# Patient Record
Sex: Male | Born: 1955 | Race: Black or African American | Hispanic: No | Marital: Married | Smoking: Never smoker
Health system: Southern US, Community
[De-identification: ages and names within clinical notes are randomized; demographics above are authoritative.]

## PROBLEM LIST (undated history)

## (undated) DIAGNOSIS — G35 Multiple sclerosis: Secondary | ICD-10-CM

## (undated) DIAGNOSIS — I10 Essential (primary) hypertension: Secondary | ICD-10-CM

## (undated) DIAGNOSIS — E782 Mixed hyperlipidemia: Secondary | ICD-10-CM

## (undated) DIAGNOSIS — I341 Nonrheumatic mitral (valve) prolapse: Secondary | ICD-10-CM

## (undated) DIAGNOSIS — E119 Type 2 diabetes mellitus without complications: Secondary | ICD-10-CM

## (undated) DIAGNOSIS — I119 Hypertensive heart disease without heart failure: Secondary | ICD-10-CM

## (undated) HISTORY — DX: Nonrheumatic mitral (valve) prolapse: I34.1

## (undated) HISTORY — PX: OTHER SURGICAL HISTORY: SHX169

## (undated) HISTORY — DX: Mixed hyperlipidemia: E78.2

## (undated) HISTORY — DX: Hypertensive heart disease without heart failure: I11.9

## (undated) HISTORY — DX: Essential (primary) hypertension: I10

---

## 1998-12-10 ENCOUNTER — Encounter: Payer: Self-pay | Admitting: Vascular Surgery

## 1998-12-11 ENCOUNTER — Encounter (INDEPENDENT_AMBULATORY_CARE_PROVIDER_SITE_OTHER): Payer: Self-pay | Admitting: Specialist

## 1998-12-11 ENCOUNTER — Ambulatory Visit (HOSPITAL_COMMUNITY): Admission: RE | Admit: 1998-12-11 | Discharge: 1998-12-11 | Payer: Self-pay | Admitting: Orthopedic Surgery

## 2000-06-26 ENCOUNTER — Emergency Department (HOSPITAL_COMMUNITY): Admission: EM | Admit: 2000-06-26 | Discharge: 2000-06-26 | Payer: Self-pay | Admitting: Emergency Medicine

## 2000-06-26 ENCOUNTER — Encounter: Payer: Self-pay | Admitting: Emergency Medicine

## 2003-07-20 ENCOUNTER — Ambulatory Visit (HOSPITAL_COMMUNITY): Admission: RE | Admit: 2003-07-20 | Discharge: 2003-07-20 | Payer: Self-pay | Admitting: Surgery

## 2003-07-20 ENCOUNTER — Ambulatory Visit (HOSPITAL_BASED_OUTPATIENT_CLINIC_OR_DEPARTMENT_OTHER): Admission: RE | Admit: 2003-07-20 | Discharge: 2003-07-20 | Payer: Self-pay | Admitting: Surgery

## 2004-03-02 ENCOUNTER — Emergency Department (HOSPITAL_COMMUNITY): Admission: EM | Admit: 2004-03-02 | Discharge: 2004-03-02 | Payer: Self-pay | Admitting: Emergency Medicine

## 2005-07-25 ENCOUNTER — Emergency Department (HOSPITAL_COMMUNITY): Admission: EM | Admit: 2005-07-25 | Discharge: 2005-07-26 | Payer: Self-pay | Admitting: Emergency Medicine

## 2006-03-15 ENCOUNTER — Emergency Department (HOSPITAL_COMMUNITY): Admission: EM | Admit: 2006-03-15 | Discharge: 2006-03-15 | Payer: Self-pay | Admitting: Emergency Medicine

## 2007-10-19 ENCOUNTER — Encounter: Admission: RE | Admit: 2007-10-19 | Discharge: 2007-10-19 | Payer: Self-pay | Admitting: Specialist

## 2008-01-04 ENCOUNTER — Ambulatory Visit: Payer: Self-pay | Admitting: Cardiology

## 2008-08-15 ENCOUNTER — Ambulatory Visit: Payer: Self-pay | Admitting: Cardiology

## 2008-08-15 DIAGNOSIS — E669 Obesity, unspecified: Secondary | ICD-10-CM | POA: Insufficient documentation

## 2008-08-15 DIAGNOSIS — I059 Rheumatic mitral valve disease, unspecified: Secondary | ICD-10-CM | POA: Insufficient documentation

## 2008-08-15 DIAGNOSIS — I080 Rheumatic disorders of both mitral and aortic valves: Secondary | ICD-10-CM | POA: Insufficient documentation

## 2008-12-22 ENCOUNTER — Encounter (INDEPENDENT_AMBULATORY_CARE_PROVIDER_SITE_OTHER): Payer: Self-pay | Admitting: *Deleted

## 2009-06-04 DIAGNOSIS — I10 Essential (primary) hypertension: Secondary | ICD-10-CM | POA: Insufficient documentation

## 2009-06-29 ENCOUNTER — Ambulatory Visit: Payer: Self-pay | Admitting: Cardiology

## 2010-06-25 NOTE — Assessment & Plan Note (Signed)
Summary: rov/ gd   Visit Type:  rov Primary Provider:  [facility]   CC:  no cardiac complaints today.  History of Present Illness: James Gibbs comes in today for followup of his hypertension. Unfortunately, generalized stress and is gaining some weight area is got away from his diet.  His blood pressure is up today. He does not check it on a regular basis.  He denies any chest pain, orthopnea, PND or peripheral edema. He's had no palpitations.  He also has a history of mild mitral valve prolapse with minimal mitral regurgitation.  Current Medications (verified): 1)  Chewable Vitamin C 500 Mg Chew (Ascorbic Acid) .... Once Daily 2)  Multivitamin Tablet Otc .... Take 1 Tablet By Mouth Once A Day 3)  Norvasc 10 Mg Tabs (Amlodipine Besylate) 4)  Toprol Xl 100 Mg Xr24h-Tab (Metoprolol Succinate) .... Take 1 Tablet By Mouth Once A Day 5)  Cardura 2 Mg Tabs (Doxazosin Mesylate) .... Take 1 Tablet By Mouth Once A Day 6)  Pravachol 40 Mg Tabs (Pravastatin Sodium) .... Take 1 Tablet By Mouth Once A Day 7)  Allopurinol 300 Mg Tabs (Allopurinol) .... Take 1 Tablet By Mouth Once A Day 8)  Flexeril 10 Mg Tabs (Cyclobenzaprine Hcl) .... As Needed 9)  Vicodin 5-500 Mg Tabs (Hydrocodone-Acetaminophen) .... As Needed  Allergies (verified): No Known Drug Allergies  Past History:  Past Medical History: Last updated: 06/04/2009 HYPERTENSION, UNCONTROLLED (ICD-401.9) OBESITY, UNSPECIFIED (ICD-278.00) MITRAL REGURGITATION (ICD-396.3) MITRAL VALVE PROLAPSE (ICD-424.0)      Past Surgical History: Last updated: 06/04/2009 Umbilical hernia repair with mesh.. SURGEON:  Douglas A. Magnus Ivan, M.D.Marland Kitchen. 07/20/2003  Family History: Last updated: 06/04/2009 There is no premature history of coronary disease.      Social History: Last updated: 06/04/2009  He is married, he has 1 child.   Review of Systems       negative other than history of present illness  Vital Signs:  Patient profile:   55  year old male Height:      72 inches Weight:      247 pounds BMI:     33.62 Pulse rate:   69 / minute Pulse rhythm:   irregular BP sitting:   122 / 90  (left arm) Cuff size:   large  Vitals Entered By: James Gibbs, James Gibbs (June 29, 2009 3:47 PM)  Physical Exam  General:  obese.   Head:  normocephalic and atraumatic Eyes:  PERRLA/EOM intact; conjunctiva and lids normal. Neck:  Neck supple, no JVD. No masses, thyromegaly or abnormal cervical nodes. Chest Krystalynn Ridgeway:  no deformities or breast masses noted Lungs:  Clear bilaterally to auscultation and percussion. Heart:  normal PMI, no gallop or click. No significant murmur Abdomen:  Bowel sounds positive; abdomen soft and non-tender without masses, organomegaly, or hernias noted. No hepatosplenomegaly. Msk:  Back normal, normal gait. Muscle strength and tone normal. Pulses:  pulses normal in all 4 extremities Extremities:  No clubbing or cyanosis. Neurologic:  Alert and oriented x 3. Skin:  Intact without lesions or rashes. Psych:  Normal affect.   EKG  Procedure date:  06/29/2009  Findings:      normal sinus rhythm first degree block no old charts here for LVH T-wave inversion laterally and apically. This is more exaggerated than last EKG. Findings consistent with hypertension  Impression & Recommendations:  Problem # 1:  HYPERTENSION, UNCONTROLLED (ICD-401.9) Assessment Deteriorated I suspect his blood pressures been up some over the past several months. This up today and  his weight is up as well. He's been a lot of stress in his diet has suffered.  I reiterated the importance of a low sodium diet, weight reduction, and taking his medications. He'll continue to follow with his primary care about blood pressure at this point. He wanted something for reck L. dysfunction but I told him to uncontrolled hypertension was a contraindication. His updated medication list for this problem includes:    Norvasc 10 Mg Tabs (Amlodipine  besylate)    Toprol Xl 100 Mg Xr24h-tab (Metoprolol succinate) .Marland Kitchen... Take 1 tablet by mouth once a day    Cardura 2 Mg Tabs (Doxazosin mesylate) .Marland Kitchen... Take 1 tablet by mouth once a day  Problem # 2:  OBESITY, UNSPECIFIED (ICD-278.00) Assessment: Deteriorated  Problem # 3:  MITRAL VALVE PROLAPSE (ICD-424.0) Assessment: Unchanged  His updated medication list for this problem includes:    Toprol Xl 100 Mg Xr24h-tab (Metoprolol succinate) .Marland Kitchen... Take 1 tablet by mouth once a day  Other Orders: EKG w/ Interpretation (93000)  Patient Instructions: 1)  Your physician recommends that you schedule a follow-up appointment in: YEAR WITH DR Coryn Mosso 2)  Your physician recommends that you continue on your current medications as directed. Please refer to the Current Medication list given to you today. 3)  Your physician has requested that you limit the intake of sodium (salt) in your diet to two grams daily. Please see [facility] handout. 4)  Your physician encouraged you to lose weight for better health.

## 2010-10-08 NOTE — Assessment & Plan Note (Signed)
[facility]                            CARDIOLOGY OFFICE NOTE   SKYLOR, SCHNAPP                    MRN:          [id number]  DATE:08/15/2008                            DOB:          July 28, 1955    Ms. Studstill comes in today for followup of his difficult control  hypertension and history of mild mitral valve prolapse.   I saw him last in August 2009.  At that time, I was concerned about  dietary and medical compliance.  Since that time, he has lost 9 pounds  and is frequented [employer] only occasionally.  He is really cut back  on salt.   His blood pressure has been running about 120/80.   I have asked him to return for 2-D echo, but he did not make it because  of the snow.   His medicines are identical to last visit except he dropped his  lisinopril which was at 40 mg a day.  This was because there has been  problems with skin color changes in his father and his sister on the  same drug.   PHYSICAL EXAMINATION:  VITAL SIGNS:  His blood pressure today is 120/80,  pulse is 65-70 and regular.  HEENT:  Unchanged.  He has remarkable arcus senilis for man of his age.  LUNGS:  Clear to auscultation and percussion.  HEART:  Nondisplaced PMI.  Normal S1 and S2.  I could not hear a click.  ABDOMEN:  Soft, good bowel sounds.  EXTREMITIES:  No edema.  Pulses are intact.  NEURO:  Intact.   EKG is normal sinus rhythm with first-degree AV block.   ASSESSMENT AND PLAN:  Mr. Monday is doing remarkably better.  I am  really pleased with his weight loss and dietary restrictions to sodium.  His blood pressure is remarkably good here and at home even though he is  off the lisinopril.  This attests the fact that he is more compliant all  the way across the board.  I reinforced this today.  I will plan on  seeing him back again in 6 months.     Thomas C. Daleen Squibb, MD, [facility]  Electronically Signed    TCW/MedQ  DD: 08/15/2008  DT: 08/16/2008  Job #:  [id number]

## 2010-10-08 NOTE — Assessment & Plan Note (Signed)
[facility]                            CARDIOLOGY OFFICE NOTE   CHEY, CHO                    MRN:          [id number]  DATE:01/04/2008                            DOB:          1955/06/15    I was asked by Payton Spark to evaluate Gaylord Shih, 55-year-  old gentleman with progressive problems with hypertension.   HISTORY OF PRESENT ILLNESS:  He is a 55 year old gentleman who was seen  by Dr. Myrtis Ser of Cardiology in 2000.  At that time, he had an abnormal  EKG.  Echocardiogram showed thickening of the mitral valve with mild  mitral valve prolapse particularly in the anterior leaflet.  He had  trace mitral regurgitation.  There was no evidence at that time of  significant LVH and his LV function was normal.  He did have some mild  thickening of the interventricular septum.   He recently was at [facility] and his blood pressure was noted to be  suboptimally controlled.  His pressure there was 168/100.  His Norvasc  was increased to 10 mg a day.   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  He also  sprinkles some salt.   His weight is up by 25-30 pounds, which he has been struggling with.   He denies any headache, visual disturbances, any symptoms of TIAs, or  stroke.  He had no chest pain, shortness of breath, orthopnea, PND, and  no peripheral edema.   Recent urine protein was up to 30 mg on a sample.  His creatinine  actually is normal at 1.1.  Rest of his blood work was unremarkable at  [facility].  TSH was normal specifically.   PAST MEDICAL HISTORY:  He is no longer smoking.  He quit in 1985.   ALLERGIES:  He has no known drug allergies.   CURRENT MEDICATIONS:  1. Vitamin C 1500 mg a day.  2. Multivitamin daily.  3. Norvasc 10 mg a day.  4. Lisinopril 40 mg a day.  5. Toprol-XL 100 mg a day.  6. Cardura 2 mg daily.  7. Pravachol 40 mg q.h.s.  8. Allopurinol 300 mg a day.   He has some problems  with his back and he has a history of multiple  sclerosis.  He takes Flexeril p.r.n. as well as Vicodin.   FAMILY HISTORY:  There is no premature history of coronary disease.   SOCIAL HISTORY:  He is married, he has 1 child.   REVIEW OF SYSTEMS:  Other than HPI is negative.  All systems were  reviewed.   PHYSICAL EXAMINATION:  VITAL SIGNS:  His blood pressure was 147/94.  He  states he did not take 1 of his blood pressure medicines this morning,  but he cannot remember which one.  His pulse is 66 and regular, his  weight is 239.  HEENT:  Normocephalic, atraumatic.  PERRLA:  Arcus senilis.  He has  cataract reduced with light reflex bilaterally.  Funduscopic exam shows  arterial narrowing and nicking.  Facial symmetry is normal.  Dentition  is in ill  repair.  Carotids upstrokes were equal bilaterally without  bruits, no JVD.  Thyroid is not enlarged.  Trachea is midline.  LUNGS:  Clear.  HEART:  Reveals no obvious click or murmur.  Difficult to appreciate  PMI.  Normal S1 and S2.  ABDOMEN EXAM:  Protuberant, good bowel sounds.  No midline bruit.  No  hepatomegaly.  EXTREMITIES:  No sinus, clubbing, or edema.  Pulses were intact.  NEURO EXAM:  Intact.  SKIN:  Unremarkable.   Electrocardiogram shows sinus rhythm with first-degree AV block.  There  is no associated ST-segment changes which were actually less than they  were on last visit here in 2000.   ASSESSMENT:  1. Uncontrolled essential hypertension.  2. Dietary noncompliance.  3. Obesity.  4. Question of medical compliance.  5. History of mitral valve prolapse.   PLAN:  1. Repeat 2-D echocardiogram to assess mitral valve and LV function      not to mention LVH.  2. I spent 20 minutes talking to him about eliminating salt as much as      possible from his diet including fast food.  3. Try to lose weight.  Hopefully, about a pound a week or total of 12      pounds over the next 3 months.  4. No change in his medical  program today.  If his blood pressure is      not coming down with good compliance, Payton Spark could      increase his Cardura.  He is pretty well maxed out on his      lisinopril, Toprol, and Norvasc.   I will plan on seeing him back in 3 months.     Thomas C. Daleen Squibb, MD, [facility]  Electronically Signed    TCW/MedQ  DD: 01/04/2008  DT: 01/05/2008  Job #: [id number]   cc:   Payton Spark, PA-C  [facility]

## 2010-10-11 NOTE — Op Note (Signed)
NAME:  [name], [name] NO.:  [id number]   MEDICAL RECORD NO.:  [id number]                   PATIENT TYPE:  AMB   LOCATION:  [facility]                                  FACILITY:  [facility]   PHYSICIAN:  Abigail Miyamoto, M.D.              DATE OF BIRTH:  [birth year]   DATE OF PROCEDURE:  07/20/2003  DATE OF DISCHARGE:                                 OPERATIVE REPORT   PREOPERATIVE DIAGNOSIS:  Umbilical hernia.   POSTOPERATIVE DIAGNOSIS:  Umbilical hernia.   PROCEDURE:  Umbilical hernia repair with mesh.   SURGEON:  Douglas A. Magnus Ivan, M.D.   ANESTHESIA:  General endotracheal anesthesia and 0.5% Marcaine.   ESTIMATED BLOOD LOSS:  Minimal.   PROCEDURE IN DETAIL:  The patient was brought to the operating room and  identified as James Gibbs.  He was placed supine on the operating table  and general anesthesia was induced.  His abdomen was then prepped and draped  in the usual sterile fashion.  Using a 15 blade, a small transverse incision  was made above the umbilicus.  The incision was carried down to the fascia  with electrocautery.  The patient was found to have two fascial defects, one  above the umbilicus and one right at the umbilicus.  Both had minimal hernia  sacs which were excised and the contents, which contained only omentum, were  placed back into the abdominal cavity.  The patient also had a small lipoma  just above the upper fascial defect which was excised.  The two fascial  defects were then made into one by cutting the fascial bridge between the  two.  Next, a piece of Duval circular mesh 6.4 cm in size was brought onto  the field.  The mesh was placed through the fascial opening and then pulled  up by the strands to the abdominal wall.  The mesh was then sewn in place  circumferentially with interrupted #1 Prolene sutures.  The fascia was  closed over the top of the mesh with interrupted figure-of-eight 0 Prolene  sutures.  Good closure of  the fascia appeared to be achieved.  The fascia  and skin were then anesthetized with 0.5% Marcaine.  The umbilicus was  tacked back in place with a single 3-0 Vicryl suture.  The subcutaneous  tissue layer was closed with interrupted 3-0 Vicryl sutures.  The skin was  closed with a running 4-0 Monocryl.  Steri-Strips, gauze, and tape were  applied.  The patient tolerated the procedure well.  All sponge, needle and  instrument counts were correct at the end of the procedure.  The patient was  then extubated in the operating room and taken in stable condition to the  recovery room.  Abigail Miyamoto, M.D.    DB/MEDQ  D:  07/20/2003  T:  07/20/2003  Job:  [id number]

## 2012-08-17 ENCOUNTER — Encounter: Payer: Self-pay | Admitting: Cardiovascular Disease

## 2012-08-17 ENCOUNTER — Ambulatory Visit (INDEPENDENT_AMBULATORY_CARE_PROVIDER_SITE_OTHER): Payer: Medicare Other | Admitting: Cardiovascular Disease

## 2012-08-17 VITALS — BP 124/90 | HR 81 | Ht 71.0 in | Wt 235.0 lb

## 2012-08-17 DIAGNOSIS — R9431 Abnormal electrocardiogram [ECG] [EKG]: Secondary | ICD-10-CM

## 2012-08-17 DIAGNOSIS — I10 Essential (primary) hypertension: Secondary | ICD-10-CM

## 2012-08-17 NOTE — Progress Notes (Signed)
HPI:  57 year old gentleman presenting for cardiac evaluation. He has previously been seen by Dr. Daleen Squibb, last visit February 2011. The patient has been followed for malignant hypertension and mitral valve prolapse or mitral regurgitation. I have looked through 2 separate databases but cannot find an old echo report.  The patient has mild dyspnea with exertion, this primarily occurs with walking uphill. He is able to exercise at the [facility] without significant symptoms. He denies exertional chest pain or pressure. He denies edema, orthopnea, palpitations, lightheadedness, or PND.  He also complains of erectile dysfunction. This is been present for about 3 years. He would like to treat this with medication, but wants to make sure his heart is healthy enough.  He reports compliance with his medication. Since his last visit here in 2011, he has been diagnosed with type 2 diabetes. He takes metformin.   Outpatient Encounter Prescriptions as of 08/17/2012  Medication Sig Dispense Refill  . amLODipine (NORVASC) 10 MG tablet Take 10 mg by mouth daily.      . Ascorbic Acid (VITAMIN C) 100 MG tablet Take 300 mg by mouth daily.      Marland Kitchen aspirin 81 MG tablet Take 81 mg by mouth daily.      . hydrochlorothiazide (MICROZIDE) 12.5 MG capsule Take 12.5 mg by mouth daily.      . Interferon Beta-1a (REBIF REBIDOSE [place]) Inject 44 Units into the skin 3 (three) times a week.      Marland Kitchen lisinopril (PRINIVIL,ZESTRIL) 40 MG tablet Take 40 mg by mouth daily.      Marland Kitchen METFORMIN HCL PO Take by mouth 2 (two) times daily.      . Multiple Vitamin (MULTIVITAMIN) tablet Take 1 tablet by mouth daily.      . Omega-3 Fatty Acids (FISH OIL) 300 MG CAPS Take 300 capsules by mouth daily.      . simvastatin (ZOCOR) 40 MG tablet Take 20 mg by mouth every evening.       No facility-administered encounter medications on file as of 08/17/2012.    Review of patient's allergies indicates not on file.  Past Medical History  Diagnosis Date  .  Malignant hypertension   . Mitral valve anterior leaflet prolapse   . 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 Mixed hyperlipidemia     History reviewed. No pertinent past surgical history.  History   Social History  . Marital Status: Married    Spouse Name: N/A    Number of Children: N/A  . Years of Education: N/A   Occupational History  . Not on file.   Social History Main Topics  . Smoking status: Never Smoker   . Smokeless tobacco: Not on file  . Alcohol Use: No  . Drug Use: No  . Sexually Active: Not on file   Other Topics Concern  . Not on file   Social History Narrative  . No narrative on file   Family History: Mother has diabetes, father died of end-stage renal disease the  ROS: General: no fevers/chills/night sweats Eyes: no blurry vision, diplopia, or amaurosis ENT: no sore throat or hearing loss Resp: no cough, wheezing, or hemoptysis CV: no edema or palpitations GI: no abdominal pain, nausea, vomiting, diarrhea, or constipation GU: no dysuria, frequency, or hematuria Skin: no rash Neuro: no headache, numbness, tingling, or weakness of extremities Musculoskeletal: no joint pain or swelling Heme: no bleeding, DVT, or easy bruising Endo: no polydipsia or polyuria  BP 124/90  Pulse 81  Ht 5\' 11"  (1.803 m)  Wt 106.595 kg (235 lb)  BMI 32.79 kg/m2  SpO2 97%  PHYSICAL EXAM: Pt is alert and oriented, WD, WN, in no distress. HEENT: normal Neck: JVP normal. Carotid upstrokes normal without bruits. No thyromegaly. Lungs: equal expansion, clear bilaterally CV: Apex is discrete and nondisplaced, RRR without murmur or gallop Abd: soft, NT, +BS, no bruit, no hepatosplenomegaly Back: no CVA tenderness Ext: no C/C/E        Femoral pulses 2+= without bruits        DP/PT pulses intact and = Skin: warm and dry without rash Neuro: CNII-XII intact             Strength intact = bilaterally  EKG:  Normal sinus rhythm with first-degree AV  block, heart rate 81 beats per minute. T wave abnormality consider anterolateral ischemia.  ASSESSMENT AND PLAN: #1. Abnormal EKG. The patient has new EKG changes since his previous tracing several years ago. He has multiple risk factors for cardiac disease, including type 2 diabetes, hypertension, and hyperlipidemia. Recommend a stress echocardiogram for risk stratification and evaluation of myocardial ischemia.  #2. Hypertension, reasonably controlled. He will continue on amlodipine, hydrochlorothiazide, lisinopril. Followup and treatment per Dr. [name].  #3. Hyperlipidemia. Lipids followed by primary care and the [facility]. He is on a statin drug.  #4. Erectile dysfunction. Awake stress echo result. If low risk, certainly reasonable to prescribe a phosphodiesterase inhibitor.  Tonny Bollman 08/17/2012 10:21 AM

## 2012-08-17 NOTE — Patient Instructions (Addendum)
Your physician has requested that you have a stress echocardiogram. For further information please visit [email or web address] Please follow instruction sheet as given.  Your physician wants you to follow-up in: 1 year with Dr. Excell Seltzer. You will receive a reminder letter in the mail two months in advance. If you don't receive a letter, please call our office to schedule the follow-up appointment.

## 2012-08-19 ENCOUNTER — Encounter: Payer: Self-pay | Admitting: Cardiovascular Disease

## 2012-08-19 ENCOUNTER — Ambulatory Visit (HOSPITAL_COMMUNITY): Payer: Medicare Other | Attending: Cardiology

## 2012-08-19 ENCOUNTER — Ambulatory Visit (HOSPITAL_BASED_OUTPATIENT_CLINIC_OR_DEPARTMENT_OTHER): Payer: Medicare Other

## 2012-08-19 DIAGNOSIS — I517 Cardiomegaly: Secondary | ICD-10-CM | POA: Insufficient documentation

## 2012-08-19 DIAGNOSIS — R9431 Abnormal electrocardiogram [ECG] [EKG]: Secondary | ICD-10-CM | POA: Insufficient documentation

## 2012-08-19 DIAGNOSIS — I10 Essential (primary) hypertension: Secondary | ICD-10-CM | POA: Insufficient documentation

## 2012-08-19 DIAGNOSIS — R0989 Other specified symptoms and signs involving the circulatory and respiratory systems: Secondary | ICD-10-CM | POA: Insufficient documentation

## 2012-08-19 DIAGNOSIS — R0609 Other forms of dyspnea: Secondary | ICD-10-CM | POA: Insufficient documentation

## 2012-08-19 DIAGNOSIS — E119 Type 2 diabetes mellitus without complications: Secondary | ICD-10-CM | POA: Insufficient documentation

## 2012-08-19 DIAGNOSIS — I059 Rheumatic mitral valve disease, unspecified: Secondary | ICD-10-CM | POA: Insufficient documentation

## 2012-08-19 NOTE — Progress Notes (Signed)
Echocardiogram performed.  

## 2013-05-10 ENCOUNTER — Ambulatory Visit (INDEPENDENT_AMBULATORY_CARE_PROVIDER_SITE_OTHER): Payer: Medicare Other | Admitting: Family Medicine

## 2013-05-10 ENCOUNTER — Ambulatory Visit: Payer: Medicare Other

## 2013-05-10 DIAGNOSIS — R079 Chest pain, unspecified: Secondary | ICD-10-CM

## 2013-05-10 DIAGNOSIS — M545 Low back pain, unspecified: Secondary | ICD-10-CM

## 2013-05-10 DIAGNOSIS — M25559 Pain in unspecified hip: Secondary | ICD-10-CM

## 2013-05-10 DIAGNOSIS — M542 Cervicalgia: Secondary | ICD-10-CM

## 2013-05-10 DIAGNOSIS — R0781 Pleurodynia: Secondary | ICD-10-CM

## 2013-05-10 LAB — POCT UA - MICROSCOPIC ONLY
Casts, Ur, LPF, POC: NEGATIVE
Crystals, Ur, HPF, POC: NEGATIVE
Mucus, UA: POSITIVE
Yeast, UA: NEGATIVE

## 2013-05-10 LAB — POCT CBC
Granulocyte percent: 61.7 %G (ref 37–80)
HCT, POC: 46 % (ref 43.5–53.7)
Hemoglobin: 14.4 g/dL (ref 14.1–18.1)
Lymph, poc: 2.8 (ref 0.6–3.4)
MCH, POC: 31.9 pg — AB (ref 27–31.2)
MCHC: 31.3 g/dL — AB (ref 31.8–35.4)
MCV: 101.7 fL — AB (ref 80–97)
MID (cbc): 0.6 (ref 0–0.9)
MPV: 9.6 fL (ref 0–99.8)
POC Granulocyte: 5.4 (ref 2–6.9)
POC LYMPH PERCENT: 31.9 %L (ref 10–50)
POC MID %: 6.4 %M (ref 0–12)
Platelet Count, POC: 185 10*3/uL (ref 142–424)
RBC: 4.52 M/uL — AB (ref 4.69–6.13)
RDW, POC: 14.1 %
WBC: 8.8 10*3/uL (ref 4.6–10.2)

## 2013-05-10 LAB — POCT URINALYSIS DIPSTICK
Bilirubin, UA: NEGATIVE
Blood, UA: NEGATIVE
Glucose, UA: NEGATIVE
Ketones, UA: 15
Leukocytes, UA: NEGATIVE
Nitrite, UA: NEGATIVE
Protein, UA: 100
Spec Grav, UA: 1.025
Urobilinogen, UA: 0.2
pH, UA: 6

## 2013-05-10 MED ORDER — MELOXICAM 7.5 MG PO TABS: 7.5000 mg | ORAL_TABLET | Freq: Every day | ORAL | Status: DC

## 2013-05-10 MED ORDER — CYCLOBENZAPRINE HCL 5 MG PO TABS: 5.0000 mg | ORAL_TABLET | Freq: Every day | ORAL | Status: DC
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
## 2013-05-10 NOTE — Addendum Note (Signed)
Addended by: Eulah Pont on: 05/10/2013 08:42 PM   Modules accepted: Level of Service

## 2013-06-06 ENCOUNTER — Ambulatory Visit (INDEPENDENT_AMBULATORY_CARE_PROVIDER_SITE_OTHER): Payer: Medicare HMO | Admitting: Neurology

## 2013-06-06 ENCOUNTER — Encounter: Payer: Self-pay | Admitting: Neurology

## 2013-06-06 VITALS — BP 157/98 | HR 69 | Ht 72.0 in | Wt 219.0 lb

## 2013-06-06 DIAGNOSIS — G35 Multiple sclerosis: Secondary | ICD-10-CM

## 2013-06-06 NOTE — Progress Notes (Signed)
[facility]    Provider:  Dr Hosie Gibbs Referring Provider: Andi Gibbs, James Gibbs Emperor, MD Primary Care Physician:  James Gibbs Islam, MD  CC:  MS diagnosis  HPI:  James Gibbs Gibbs is a 58 y.o. male here as a referral from James Gibbs. James Gibbs Gibbs for possible MS diagnosis  Patient was seen by James Gibbs. James Gibbs Gibbs in the past, last visit was in 1987. Diagnosed with MS at that time, was having tinnitus, had MRI done and was told he had MS. Started taking Avonex and then switched to Rebif. Currently taking Tecfidera twice a day. He wishes to take Gilenya but is currently happy with the Tecfidera. His last MRI was around 1 year ago, states it is stable. States his MS is currently stable. No recent flare ups.   Discharged from service in 1977. Was officially diagnosed with MS in 1983. He reports during his time in the [employer] he had several episodes of focal weakness/sensory changes and episodes of painful vision loss/change. These were brief transient episodes and he was able to continue to serve actively in [name]. Patient was initially seen by James Gibbs. James Gibbs Gibbs in the mid 1980s who documented a history of MS since the patients early 1980s.   Review of Systems: Out of a complete 14 system review, the patient complains of only the following symptoms, and all other reviewed systems are negative. Denies any positive review of systems  History   Social History  . Marital Status: Married    Spouse Name: James Gibbs Gibbs    Number of Children: 1  . Years of Education: college   Occupational History  . Not on file.   Social History Main Topics  . Smoking status: Never Smoker   . Smokeless tobacco: Not on file  . Alcohol Use: No  . Drug Use: No  . Sexual Activity: Not on file   Other Topics Concern  . Not on file   Social History Narrative   Patient lives at home with wife James Gibbs Gibbs)   Patient is right handed   Education college   Caffeine consumption is 1 cup daily    No family history on file.  Past  Medical History  Diagnosis Date  . Malignant hypertension   . Mitral valve anterior leaflet prolapse   . 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 Mixed hyperlipidemia     Past Surgical History  Procedure Laterality Date  . None      Current Outpatient Prescriptions  Medication Sig Dispense Refill  . Acetaminophen (ARTHRITIS PAIN RELIEF PO) Take 650 mg by mouth.      Marland Kitchen allopurinol (ZYLOPRIM) 300 MG tablet Take 300 mg by mouth daily.      Marland Kitchen amLODipine (NORVASC) 10 MG tablet Take 10 mg by mouth daily.      . Ascorbic Acid (VITAMIN C) 100 MG tablet Take 300 mg by mouth daily.      Marland Kitchen aspirin 81 MG tablet Take 81 mg by mouth daily.      . cholecalciferol (VITAMIN D) 400 UNITS TABS tablet Take 400 Units by mouth.      . Cyanocobalamin (B-12) 2000 MCG TABS Take by mouth.      . hydrochlorothiazide (MICROZIDE) 12.5 MG capsule Take 12.5 mg by mouth daily.      Marland Kitchen lisinopril (PRINIVIL,ZESTRIL) 40 MG tablet Take 40 mg by mouth daily.      Marland Kitchen METFORMIN HCL PO Take by mouth 2 (two) times daily.      . Multiple Vitamin (MULTIVITAMIN)  tablet Take 1 tablet by mouth daily.      . Omega-3 Fatty Acids (FISH OIL) 300 MG CAPS Take 300 capsules by mouth daily.      . simvastatin (ZOCOR) 40 MG tablet Take 20 mg by mouth every evening.       No current facility-administered medications for this visit.    Allergies as of 06/06/2013  . (No Known Allergies)    Vitals: BP 157/98  Pulse 69  Ht 6' (1.829 m)  Wt 219 lb (99.338 kg)  BMI 29.70 kg/m2 Last Weight:  Wt Readings from Last 1 Encounters:  06/06/13 219 lb (99.338 kg)   Last Height:   Ht Readings from Last 1 Encounters:  06/06/13 6' (1.829 m)     Physical exam: Exam: Gen: NAD, conversant Eyes: anicteric sclerae, moist conjunctivae HENT: Atraumatic, oropharynx clear Neck: Trachea midline; supple,  Lungs: CTA, no wheezing, rales, rhonic                          CV: RRR, no MRG Abdomen: Soft, non-tender;    Extremities: No peripheral edema  Skin: Normal temperature, no rash,  Psych: Appropriate affect, pleasant  Neuro: MS: AA&Ox3, appropriately interactive, normal affect   Speech: fluent w/o paraphasic error  Memory: good recent and remote recall  CN: PERRL, EOMI no nystagmus, no ptosis, sensation intact to LT V1-V3 bilat, face symmetric, no weakness, hearing grossly intact, palate elevates symmetrically, shoulder shrug 5/5 bilat,  tongue protrudes midline, no fasiculations noted.  Motor: normal bulk and tone Strength: 5/5  In all extremities  Coord: rapid alternating and point-to-point (FNF, HTS) movements intact.  Reflexes: symmetrical, bilat downgoing toes  Sens: LT intact in all extremities  Gait: posture, stance, stride and arm-swing normal. Tandem gait intact. Able to walk on heels and toes. Romberg absent.   Assessment:  After physical and neurologic examination, review of laboratory studies, imaging, neurophysiology testing and pre-existing records, assessment will be reviewed on the problem list.  Plan:  Treatment plan and additional workup will be reviewed under Problem List.  1)Multiple sclerosis  58 year old gentleman with history of multiple sclerosis presenting for initial evaluation. Patient has a documented history of diagnosis of MS since 1983, based on his clinical description history is likely he was also suffering from multiple sclerosis during his time in the [facility]. He is currently taking Tequin or twice a day, tolerated well, his MS appears well controlled. Unfortunately his original MRI from the early 1980s is not available for review to compared to most recent scan. [facility]. Followup in 6 months or as needed.    James Gibbs ChoPeter Zebulan Hinshaw, DO  [facility] [address] [place], [place] [id number]  Phone [phone] Fax [phone]

## 2013-06-30 ENCOUNTER — Telehealth: Payer: Self-pay | Admitting: Neurology

## 2013-06-30 NOTE — Telephone Encounter (Signed)
Patient called to state that he is still waiting on the letter that Dr. Hosie Poisson was going to send him regarding another MS doctor. Please call and advise.

## 2013-07-01 NOTE — Telephone Encounter (Signed)
Called patient and explained to him that Dr. Hosie Poisson is requesting his records from [facility]. And if he would have that done, then he would see about writing the letter. I advised the patient that if he has any other problems, questions or concerns to call the office. Patient verbalized understanding.

## 2013-07-29 NOTE — Telephone Encounter (Signed)
Patient calling to state that he is supposed to bring a letter from Dr. Hosie Gibbs to his physician in [place] when he goes on Monday. Please call patient regarding this matter.

## 2013-07-29 NOTE — Telephone Encounter (Signed)
Called patient to inform him that Dr. Hosie Poisson still has not receive his medical records from the [place] [facility] and if he would have them send them to [place]. I also informed the patient that Dr. Hosie Poisson needed the medical records before he would even consider writing a letter, per Dr. Hosie Poisson. I advised the patient that if he has any other problems, questions or concerns to call the office. Patient verbalized  Understanding.

## 2013-10-07 ENCOUNTER — Ambulatory Visit (INDEPENDENT_AMBULATORY_CARE_PROVIDER_SITE_OTHER): Payer: Medicare PPO | Admitting: Cardiovascular Disease

## 2013-10-07 ENCOUNTER — Encounter: Payer: Self-pay | Admitting: Cardiovascular Disease

## 2013-10-07 VITALS — BP 128/82 | HR 68 | Ht 72.0 in | Wt 222.5 lb

## 2013-10-07 DIAGNOSIS — I10 Essential (primary) hypertension: Secondary | ICD-10-CM

## 2013-10-07 DIAGNOSIS — I080 Rheumatic disorders of both mitral and aortic valves: Secondary | ICD-10-CM

## 2013-10-07 NOTE — Patient Instructions (Signed)
Your physician wants you to follow-up in: 1 YEAR with Dr Cooper.  You will receive a reminder letter in the mail two months in advance. If you don't receive a letter, please call our office to schedule the follow-up appointment.  Your physician recommends that you continue on your current medications as directed. Please refer to the Current Medication list given to you today.  

## 2013-10-07 NOTE — Progress Notes (Signed)
    HPI:  58 year old gentleman presenting for followup evaluation. The patient has been seen for hypertension and mitral valve prolapse. He was last evaluated about one year ago. He had a stress echocardiogram at that time because of an abnormal EKG. This was essentially normal other the finding of left ventricular hypertrophy.  The patient is doing well. He continues to do hard physical work. 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 He specifically denies chest pain, chest pressure, shortness of breath, palpitations, or leg swelling. He reports compliance with his medications.  Outpatient Encounter Prescriptions as of 10/07/2013  Medication Sig  . allopurinol (ZYLOPRIM) 300 MG tablet Take 300 mg by mouth daily.  Marland Kitchen amLODipine (NORVASC) 10 MG tablet Take 10 mg by mouth daily.  Marland Kitchen aspirin 81 MG tablet Take 81 mg by mouth daily.  Marland Kitchen atorvastatin (LIPITOR) 80 MG tablet Take 80 mg by mouth daily.  . Dimethyl Fumarate (TECFIDERA) 240 MG CPDR Take 240 mg by mouth 2 (two) times daily.  . hydrochlorothiazide (MICROZIDE) 12.5 MG capsule Take 12.5 mg by mouth daily.  Marland Kitchen lisinopril (PRINIVIL,ZESTRIL) 40 MG tablet Take 40 mg by mouth daily.  Marland Kitchen METFORMIN HCL PO Take 500 mg by mouth 2 (two) times daily.   . Omega-3 Fatty Acids (FISH OIL) 1000 MG CAPS Take 3,000 mg by mouth.   . [DISCONTINUED] Ascorbic Acid (VITAMIN C) 100 MG tablet Take 300 mg by mouth daily.  . [DISCONTINUED] Acetaminophen (ARTHRITIS PAIN RELIEF PO) Take 650 mg by mouth.  . [DISCONTINUED] cholecalciferol (VITAMIN D) 400 UNITS TABS tablet Take 400 Units by mouth.  . [DISCONTINUED] Cyanocobalamin (B-12) 2000 MCG TABS Take by mouth.  . [DISCONTINUED] Multiple Vitamin (MULTIVITAMIN) tablet Take 1 tablet by mouth daily.  . [DISCONTINUED] Omega-3 Fatty Acids (FISH OIL) 300 MG CAPS Take 1,000 capsules by mouth daily.   . [DISCONTINUED] simvastatin (ZOCOR) 40 MG tablet Take 20 mg by mouth every evening.    No Known  Allergies  Past Medical History  Diagnosis Date  . Malignant hypertension   . Mitral valve anterior leaflet prolapse   . 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 Mixed hyperlipidemia     ROS: Negative except as per HPI  BP 128/82  Pulse 68  Ht 6' (1.829 m)  Wt 100.925 kg (222 lb 8 oz)  BMI 30.17 kg/m2  PHYSICAL EXAM: Pt is alert and oriented, NAD HEENT: normal Neck: JVP - normal, carotids 2+= without bruits Lungs: CTA bilaterally CV: RRR with grade 2/6 systolic murmur heard at the apex with the patient in left lateral decubitus position Abd: soft, NT, Positive BS, no hepatomegaly Ext: no C/C/E, distal pulses intact and equal Skin: warm/dry no rash  EKG:  Sinus rhythm 68 beats per minute, T-wave abnormality consider anterolateral ischemia.  ASSESSMENT AND PLAN: 1. Malignant hypertension. Blood pressure is well controlled on combination of amlodipine, hydrochlorothiazide, and lisinopril.  2. Mitral valve prolapse. Mild MR by exam. Continue clinical followup. The patient is completely asymptomatic.  I will see him back in one year  Tonny Bollman 10/07/2013 9:30 AM

## 2013-11-29 ENCOUNTER — Telehealth: Payer: Self-pay | Admitting: Neurology

## 2013-11-29 ENCOUNTER — Encounter: Payer: Self-pay | Admitting: Neurology

## 2013-11-29 NOTE — Telephone Encounter (Signed)
Spoke to patient regarding rescheduling 12/05/13 appointment per Dr. Minus Breeding schedule, patient verbalized understanding. Printed and sent letter with new appointment time.

## 2013-12-05 ENCOUNTER — Ambulatory Visit: Payer: Medicare HMO | Admitting: Neurology

## 2013-12-20 DIAGNOSIS — Z0271 Encounter for disability determination: Secondary | ICD-10-CM

## 2013-12-23 ENCOUNTER — Encounter: Payer: Self-pay | Admitting: Neurology

## 2013-12-28 NOTE — Telephone Encounter (Signed)
Noted  

## 2014-01-05 ENCOUNTER — Ambulatory Visit: Payer: Medicare HMO | Admitting: Neurology

## 2014-01-20 ENCOUNTER — Encounter: Payer: Self-pay | Admitting: Neurology

## 2014-01-20 ENCOUNTER — Ambulatory Visit (INDEPENDENT_AMBULATORY_CARE_PROVIDER_SITE_OTHER): Payer: Medicare HMO | Admitting: Neurology

## 2014-01-20 VITALS — BP 133/80 | HR 69 | Ht 72.0 in | Wt 215.6 lb

## 2014-01-20 DIAGNOSIS — G35 Multiple sclerosis: Secondary | ICD-10-CM

## 2014-01-20 NOTE — Progress Notes (Signed)
[facility]    Provider:  Dr Hosie Poisson Referring Provider: [name], [name], MD Primary Care Physician:  Lavell Islam, MD  CC:  MS diagnosis  HPI:  James Gibbs is a 58 y.o. male here as a follow up from Dr. [name] for possible MS diagnosis. Since last visit he overall states he is doing well. Currently taking Tecfidera  twice a day. States that this medication is working well for him. No acute concerns at this time. States he is walking without difficulty. No falls. No weakness or paresthesias. We are still awaiting his medical records from the [facility].   Initial visit 05/2013: Patient was seen by Dr. [name] in the past, last visit was in 1987. Diagnosed with MS in the early 1980s, had MRI done and was told he had MS. Started taking Avonex and then switched to Rebif. Currently taking Tecfidera twice a day. Marland Kitchen His last MRI was around 1 year ago, states it is stable. States his MS is currently stable. No recent flare ups.   Discharged from service in 1977. Was officially diagnosed with MS in 1983. He reports during his time in the [name] he had several episodes of focal weakness/sensory changes and episodes of painful vision loss/change. These were brief transient episodes and he was able to continue to serve actively in [name]. Patient was initially seen by Dr. [name] in the mid 1980s who documented a history of MS since the patients early 1980s.   Review of Systems: Out of a complete 14 system review, the patient complains of only the following symptoms, and all other reviewed systems are negative. Denies any positive review of systems  History   Social History  . Marital Status: Married    Spouse Name: James Gibbs    Number of Children: 1  . Years of Education: college   Occupational History  . Not on file.   Social History Main Topics  . Smoking status: Never Smoker   . Smokeless tobacco: Never Used  . Alcohol Use: No  . Drug Use: No  . Sexual  Activity: Not on file   Other Topics Concern  . Not on file   Social History Narrative   Patient lives at home with wife James Gibbs)   Patient is right handed   Education college   Caffeine consumption is 1 cup daily    History reviewed. No pertinent family history.  Past Medical History  Diagnosis Date  . Malignant hypertension   . Mitral valve anterior leaflet prolapse   . 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 Mixed hyperlipidemia     Past Surgical History  Procedure Laterality Date  . None      Current Outpatient Prescriptions  Medication Sig Dispense Refill  . allopurinol (ZYLOPRIM) 300 MG tablet Take 300 mg by mouth daily.      Marland Kitchen amLODipine (NORVASC) 10 MG tablet Take 10 mg by mouth daily.      Marland Kitchen aspirin 81 MG tablet Take 81 mg by mouth daily.      Marland Kitchen atorvastatin (LIPITOR) 80 MG tablet Take 80 mg by mouth daily.      . Dimethyl Fumarate (TECFIDERA) 240 MG CPDR Take 240 mg by mouth 2 (two) times daily.      . hydrochlorothiazide (MICROZIDE) 12.5 MG capsule Take 12.5 mg by mouth daily.      Marland Kitchen lisinopril (PRINIVIL,ZESTRIL) 40 MG tablet Take 40 mg by mouth daily.      Marland Kitchen METFORMIN HCL  PO Take 500 mg by mouth 2 (two) times daily.       . Omega-3 Fatty Acids (FISH OIL) 1000 MG CAPS Take 3,000 mg by mouth.       . potassium chloride 20 MEQ/15ML (10%) solution Take by mouth daily.      . sildenafil (VIAGRA) 100 MG tablet Take 100 mg by mouth daily as needed for erectile dysfunction.       No current facility-administered medications for this visit.    Allergies as of 01/20/2014  . (No Known Allergies)    Vitals: BP 133/80  Pulse 69  Ht 6' (1.829 m)  Wt 215 lb 9.6 oz (97.796 kg)  BMI 29.23 kg/m2 Last Weight:  Wt Readings from Last 1 Encounters:  01/20/14 215 lb 9.6 oz (97.796 kg)   Last Height:   Ht Readings from Last 1 Encounters:  01/20/14 6' (1.829 m)     Physical exam: Exam: Gen: NAD, conversant Eyes: anicteric sclerae, moist  conjunctivae HENT: Atraumatic, oropharynx clear Neck: Trachea midline; supple,  Lungs: CTA, no wheezing, rales, rhonic                          CV: RRR, no MRG Abdomen: Soft, non-tender;  Extremities: No peripheral edema  Skin: Normal temperature, no rash,  Psych: Appropriate affect, pleasant  Neuro: MS: AA&Ox3, appropriately interactive, normal affect   Speech: fluent w/o paraphasic error  Memory: good recent and remote recall  CN: PERRL, EOMI no nystagmus, no ptosis, sensation intact to LT V1-V3 bilat, face symmetric, no weakness, hearing grossly intact, palate elevates symmetrically, shoulder shrug 5/5 bilat,  tongue protrudes midline, no fasiculations noted.  Motor: normal bulk and tone Strength: 5/5  In all extremities  Coord: rapid alternating and point-to-point (FNF, HTS) movements intact.  Reflexes: symmetrical, bilat downgoing toes  Sens: LT intact in all extremities  Gait: posture, stance, stride and arm-swing normal. Tandem gait intact. Able to walk on heels and toes. Romberg absent.   Assessment:  After physical and neurologic examination, review of laboratory studies, imaging, neurophysiology testing and pre-existing records, assessment will be reviewed on the problem list.  Plan:  Treatment plan and additional workup will be reviewed under Problem List.  1)Multiple sclerosis  58 year old gentleman with history of multiple sclerosis presenting for follow up evaluation. Patient has a documented history of diagnosis of MS since 1983, based on his clinical description history it is likely he was also suffering from multiple sclerosis during his time in the [name]. He is currently taking Tequin or twice a day, tolerated well, his MS appears well controlled. Unfortunately his original MRI from the early 1980s is not available for review to compared to most recent scan. Patient will sign medical release form so we can obtain records from [facility], including recent repeat  MRI brain. Follow up in 6 months with Darrol Angel.     Elspeth Cho, DO  [facility] [address] [place], [place] [address]  Phone [phone] Fax [phone]

## 2014-01-20 NOTE — Patient Instructions (Signed)
Overall you are doing fairly well but I do want to suggest a few things today:   Remember to drink plenty of fluid, eat healthy meals and do not skip any meals. Try to eat protein with a every meal and eat a healthy snack such as fruit or nuts in between meals. Try to keep a regular sleep-wake schedule and try to exercise daily, particularly in the form of walking, 20-30 minutes a day, if you can.   As far as your medications are concerned, I would like to suggest you continue on the Tecfidera for now  Please sign a medical release form when you check out.   Please follow up in 4 to 6 months with Darrol Angel, NP.  Please call us with any interim questions, concerns, problems, updates or refill requests.   Our phone number is [phone]. We also have an after hours call service for urgent matters and there is a physician on-call for urgent questions. For any emergencies you know to call 911 or go to the nearest emergency room

## 2014-10-16 ENCOUNTER — Ambulatory Visit: Payer: Medicare HMO | Admitting: Nurse Practitioner

## 2014-10-17 ENCOUNTER — Ambulatory Visit (INDEPENDENT_AMBULATORY_CARE_PROVIDER_SITE_OTHER): Payer: Medicare HMO | Admitting: Cardiovascular Disease

## 2014-10-17 ENCOUNTER — Encounter: Payer: Self-pay | Admitting: Cardiovascular Disease

## 2014-10-17 VITALS — BP 115/76 | HR 86 | Wt 215.0 lb

## 2014-10-17 DIAGNOSIS — I080 Rheumatic disorders of both mitral and aortic valves: Secondary | ICD-10-CM

## 2014-10-17 DIAGNOSIS — I10 Essential (primary) hypertension: Secondary | ICD-10-CM

## 2014-10-17 NOTE — Patient Instructions (Signed)

## 2014-10-17 NOTE — Progress Notes (Signed)
Cardiology Office Note Date:  10/17/2014   ID:  James Gibbs, DOB 08/21/1955, MRN [id number]  PCP:  Lavell Islam, MD  Cardiologist:  Tonny Bollman, MD    Chief Complaint  Patient presents with  . Hypertension    History of Present Illness: James Gibbs is a 59 y.o. male who presents for followup evaluation. The patient has been seen for hypertension and mitral valve prolapse. He was last evaluated about one year ago. He had a stress echocardiogram  In 2014 because of an abnormal EKG. This was essentially normal other the finding of left ventricular hypertrophy.   the patient's wife just passed away last week from an intracerebral hemorrhage. She had  Several medical problems including end-stage renal disease. He is grieving her loss.   From a cardiac perspective he has no complaints today. He denies chest pain, chest pressure shortness of breath, orthopnea, PND, or heart palpitations. His blood pressure has been elevated in the range of 140s over 90s.   Past Medical History  Diagnosis Date  . Malignant hypertension   . Mitral valve anterior leaflet prolapse   . 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 Mixed hyperlipidemia     Past Surgical History  Procedure Laterality Date  . None      Current Outpatient Prescriptions  Medication Sig Dispense Refill  . allopurinol (ZYLOPRIM) 300 MG tablet Take 300 mg by mouth daily.    Marland Kitchen amLODipine (NORVASC) 10 MG tablet Take 10 mg by mouth daily.    . Ascorbic Acid (VITAMIN C) 100 MG tablet Take 100 mg by mouth daily.    Marland Kitchen aspirin 81 MG tablet Take 81 mg by mouth daily.    Marland Kitchen atorvastatin (LIPITOR) 80 MG tablet Take 80 mg by mouth daily.    . cholecalciferol (VITAMIN D) 400 UNITS TABS tablet Take 400 Units by mouth daily.    . Dimethyl Fumarate (TECFIDERA) 240 MG CPDR Take 240 mg by mouth 2 (two) times daily.    . Hydrochlorothiazide (MICROZIDE PO) Take 25 mg by mouth daily.    Marland Kitchen lisinopril  (PRINIVIL,ZESTRIL) 40 MG tablet Take 40 mg by mouth daily.    Marland Kitchen METFORMIN HCL PO Take 500 mg by mouth 2 (two) times daily.     . Omega-3 Fatty Acids (FISH OIL) 1000 MG CAPS Take 3,000 mg by mouth.     . potassium chloride 20 MEQ/15ML (10%) solution Take by mouth daily.    . sildenafil (VIAGRA) 100 MG tablet Take 100 mg by mouth daily as needed for erectile dysfunction.     No current facility-administered medications for this visit.    Allergies:   Review of patient's allergies indicates no known allergies.   Social History:  The patient  reports that he has never smoked. He has never used smokeless tobacco. He reports that he does not drink alcohol or use illicit drugs.   Family History:  The patient's  family history includes Hypertension in his father and mother.    ROS:  Please see the history of present illness.  Otherwise, review of systems is positive for balance problems.  All other systems are reviewed and negative.    PHYSICAL EXAM: VS:  BP 115/76 mmHg  Pulse 86  Wt 215 lb (97.523 kg) , BMI Body mass index is 29.15 kg/(m^2). GEN: Well nourished, well developed, in no acute distress HEENT: normal Neck: no JVD, no masses. No carotid bruits Cardiac: RRR without murmur or gallop  Respiratory:  clear to auscultation bilaterally, normal work of breathing GI: soft, nontender, nondistended, + BS MS: no deformity or atrophy Ext: no pretibial edema, pedal pulses 2+= bilaterally Skin: warm and dry, no rash Neuro:  Strength and sensation are intact Psych: euthymic mood, full affect  EKG:  EKG is ordered today. The ekg ordered today shows NSR 86 bpm, LVH with repolarization abnormality unchanged from last tracing  Recent Labs: No results found for requested labs within last 365 days.   Lipid Panel  No results found for: CHOL, TRIG, HDL, CHOLHDL, VLDL, LDLCALC, LDLDIRECT    Wt Readings from Last 3 Encounters:  10/17/14 215 lb (97.523 kg)  01/20/14 215 lb 9.6  oz (97.796 kg)  10/07/13 222 lb 8 oz (100.925 kg)    ASSESSMENT AND PLAN: 1.  HTN with LVH: BP appears well-controlled on current Rx. Reviewed low sodium diet, med adherence, etc.  Reports home blood pressure is mildly elevated, but certainly with the recent death of his wife this is expected.   2. Hyperlipidemia: on atorvastatin at high-dose. Followed by PCP. Also has labs monitored through the [place] system.   Current medicines are reviewed with the patient today.  The patient does not have concerns regarding medicines.  Labs/ tests ordered today include:   Orders Placed This Encounter  Procedures  . EKG 12-Lead    Disposition:   FU one year  Signed, Tonny Bollman, MD  10/17/2014 11:18 PM    [facility] [address] [place], [place], [place]  [address] Phone: [phone]; Fax: [phone]

## 2014-10-20 ENCOUNTER — Ambulatory Visit: Payer: Medicare HMO | Admitting: Nurse Practitioner

## 2014-11-03 ENCOUNTER — Ambulatory Visit: Payer: Medicare HMO | Admitting: Nurse Practitioner

## 2018-12-13 ENCOUNTER — Encounter (HOSPITAL_COMMUNITY): Payer: Self-pay

## 2018-12-13 ENCOUNTER — Other Ambulatory Visit: Payer: Self-pay

## 2018-12-13 ENCOUNTER — Inpatient Hospital Stay (HOSPITAL_COMMUNITY)
Admission: EM | Admit: 2018-12-13 | Discharge: 2018-12-22 | DRG: 378 | Disposition: A | Discharge status: Home Health | Payer: Medicare HMO | Attending: Internal Medicine | Admitting: Internal Medicine

## 2018-12-13 ENCOUNTER — Emergency Department (HOSPITAL_COMMUNITY): Payer: Medicare HMO

## 2018-12-13 DIAGNOSIS — Z20828 Contact with and (suspected) exposure to other viral communicable diseases: Secondary | ICD-10-CM | POA: Diagnosis present

## 2018-12-13 DIAGNOSIS — I340 Nonrheumatic mitral (valve) insufficiency: Secondary | ICD-10-CM | POA: Diagnosis present

## 2018-12-13 DIAGNOSIS — K921 Melena: Principal | ICD-10-CM | POA: Diagnosis present

## 2018-12-13 DIAGNOSIS — I341 Nonrheumatic mitral (valve) prolapse: Secondary | ICD-10-CM | POA: Diagnosis present

## 2018-12-13 DIAGNOSIS — M109 Gout, unspecified: Secondary | ICD-10-CM | POA: Diagnosis present

## 2018-12-13 DIAGNOSIS — I10 Essential (primary) hypertension: Secondary | ICD-10-CM | POA: Diagnosis present

## 2018-12-13 DIAGNOSIS — I9589 Other hypotension: Secondary | ICD-10-CM

## 2018-12-13 DIAGNOSIS — G35 Multiple sclerosis: Secondary | ICD-10-CM | POA: Diagnosis present

## 2018-12-13 DIAGNOSIS — Z8249 Family history of ischemic heart disease and other diseases of the circulatory system: Secondary | ICD-10-CM | POA: Diagnosis not present

## 2018-12-13 DIAGNOSIS — E782 Mixed hyperlipidemia: Secondary | ICD-10-CM | POA: Diagnosis present

## 2018-12-13 DIAGNOSIS — E1159 Type 2 diabetes mellitus with other circulatory complications: Secondary | ICD-10-CM | POA: Diagnosis present

## 2018-12-13 DIAGNOSIS — I951 Orthostatic hypotension: Secondary | ICD-10-CM | POA: Diagnosis present

## 2018-12-13 DIAGNOSIS — D539 Nutritional anemia, unspecified: Secondary | ICD-10-CM | POA: Diagnosis not present

## 2018-12-13 DIAGNOSIS — I152 Hypertension secondary to endocrine disorders: Secondary | ICD-10-CM | POA: Diagnosis present

## 2018-12-13 DIAGNOSIS — E119 Type 2 diabetes mellitus without complications: Secondary | ICD-10-CM | POA: Diagnosis present

## 2018-12-13 DIAGNOSIS — G4730 Sleep apnea, unspecified: Secondary | ICD-10-CM | POA: Diagnosis present

## 2018-12-13 DIAGNOSIS — I959 Hypotension, unspecified: Secondary | ICD-10-CM | POA: Diagnosis present

## 2018-12-13 DIAGNOSIS — E861 Hypovolemia: Secondary | ICD-10-CM | POA: Diagnosis not present

## 2018-12-13 DIAGNOSIS — R55 Syncope and collapse: Secondary | ICD-10-CM | POA: Diagnosis not present

## 2018-12-13 DIAGNOSIS — K922 Gastrointestinal hemorrhage, unspecified: Secondary | ICD-10-CM | POA: Diagnosis not present

## 2018-12-13 DIAGNOSIS — E876 Hypokalemia: Secondary | ICD-10-CM | POA: Diagnosis present

## 2018-12-13 DIAGNOSIS — K2971 Gastritis, unspecified, with bleeding: Secondary | ICD-10-CM | POA: Diagnosis not present

## 2018-12-13 DIAGNOSIS — M1A9XX0 Chronic gout, unspecified, without tophus (tophi): Secondary | ICD-10-CM | POA: Diagnosis not present

## 2018-12-13 DIAGNOSIS — D62 Acute posthemorrhagic anemia: Secondary | ICD-10-CM | POA: Diagnosis present

## 2018-12-13 DIAGNOSIS — E869 Volume depletion, unspecified: Secondary | ICD-10-CM | POA: Diagnosis present

## 2018-12-13 DIAGNOSIS — K449 Diaphragmatic hernia without obstruction or gangrene: Secondary | ICD-10-CM | POA: Diagnosis present

## 2018-12-13 DIAGNOSIS — I080 Rheumatic disorders of both mitral and aortic valves: Secondary | ICD-10-CM | POA: Diagnosis not present

## 2018-12-13 DIAGNOSIS — K5731 Diverticulosis of large intestine without perforation or abscess with bleeding: Secondary | ICD-10-CM | POA: Diagnosis not present

## 2018-12-13 LAB — URINALYSIS, ROUTINE W REFLEX MICROSCOPIC
Bilirubin Urine: NEGATIVE
Glucose, UA: NEGATIVE mg/dL
Hgb urine dipstick: NEGATIVE
Ketones, ur: NEGATIVE mg/dL
Leukocytes,Ua: NEGATIVE
Nitrite: NEGATIVE
Protein, ur: NEGATIVE mg/dL
Specific Gravity, Urine: 1.013 (ref 1.005–1.030)
pH: 6 (ref 5.0–8.0)

## 2018-12-13 LAB — CBC WITH DIFFERENTIAL/PLATELET
Abs Immature Granulocytes: 0.03 10*3/uL (ref 0.00–0.07)
Basophils Absolute: 0 10*3/uL (ref 0.0–0.1)
Basophils Relative: 0 %
Eosinophils Absolute: 0.1 10*3/uL (ref 0.0–0.5)
Eosinophils Relative: 2 %
HCT: 23.2 % — ABNORMAL LOW (ref 39.0–52.0)
Hemoglobin: 7.6 g/dL — ABNORMAL LOW (ref 13.0–17.0)
Immature Granulocytes: 0 %
Lymphocytes Relative: 20 %
Lymphs Abs: 1.8 10*3/uL (ref 0.7–4.0)
MCH: 33.3 pg (ref 26.0–34.0)
MCHC: 32.8 g/dL (ref 30.0–36.0)
MCV: 101.8 fL — ABNORMAL HIGH (ref 80.0–100.0)
Monocytes Absolute: 0.5 10*3/uL (ref 0.1–1.0)
Monocytes Relative: 6 %
Neutro Abs: 6.6 10*3/uL (ref 1.7–7.7)
Neutrophils Relative %: 72 %
Platelets: 192 10*3/uL (ref 150–400)
RBC: 2.28 MIL/uL — ABNORMAL LOW (ref 4.22–5.81)
RDW: 15.9 % — ABNORMAL HIGH (ref 11.5–15.5)
WBC: 9.2 10*3/uL (ref 4.0–10.5)
nRBC: 0 % (ref 0.0–0.2)

## 2018-12-13 LAB — PROTIME-INR
INR: 1.1 (ref 0.8–1.2)
Prothrombin Time: 13.9 seconds (ref 11.4–15.2)

## 2018-12-13 LAB — PREPARE RBC (CROSSMATCH)

## 2018-12-13 LAB — FERRITIN: Ferritin: 20 ng/mL — ABNORMAL LOW (ref 24–336)

## 2018-12-13 LAB — IRON AND TIBC
Iron: 53 ug/dL (ref 45–182)
Saturation Ratios: 24 % (ref 17.9–39.5)
TIBC: 217 ug/dL — ABNORMAL LOW (ref 250–450)
UIBC: 164 ug/dL

## 2018-12-13 LAB — COMPREHENSIVE METABOLIC PANEL
ALT: 22 U/L (ref 0–44)
AST: 25 U/L (ref 15–41)
Albumin: 3 g/dL — ABNORMAL LOW (ref 3.5–5.0)
Alkaline Phosphatase: 47 U/L (ref 38–126)
Anion gap: 10 (ref 5–15)
BUN: 23 mg/dL (ref 8–23)
CO2: 25 mmol/L (ref 22–32)
Calcium: 8 mg/dL — ABNORMAL LOW (ref 8.9–10.3)
Chloride: 105 mmol/L (ref 98–111)
Creatinine, Ser: 1.11 mg/dL (ref 0.61–1.24)
GFR calc Af Amer: >60 mL/min (ref >60)
GFR calc non Af Amer: >60 mL/min (ref >60)
Glucose, Bld: 189 mg/dL — ABNORMAL HIGH (ref 70–99)
Potassium: 3.4 mmol/L — ABNORMAL LOW (ref 3.5–5.1)
Sodium: 140 mmol/L (ref 135–145)
Total Bilirubin: 0.3 mg/dL (ref 0.3–1.2)
Total Protein: 5.2 g/dL — ABNORMAL LOW (ref 6.5–8.1)

## 2018-12-13 LAB — RETICULOCYTES
Immature Retic Fract: 28 % — ABNORMAL HIGH (ref 2.3–15.9)
RBC.: 2.25 MIL/uL — ABNORMAL LOW (ref 4.22–5.81)
Retic Count, Absolute: 113.2 10*3/uL (ref 19.0–186.0)
Retic Ct Pct: 5 % — ABNORMAL HIGH (ref 0.4–3.1)

## 2018-12-13 LAB — LIPASE, BLOOD: Lipase: 25 U/L (ref 11–51)

## 2018-12-13 LAB — HEMOGLOBIN AND HEMATOCRIT, BLOOD
HCT: 25.9 % — ABNORMAL LOW (ref 39.0–52.0)
Hemoglobin: 8.5 g/dL — ABNORMAL LOW (ref 13.0–17.0)

## 2018-12-13 LAB — TSH: TSH: 2 u[IU]/mL (ref 0.350–4.500)

## 2018-12-13 LAB — FOLATE: Folate: 23.1 ng/mL (ref >5.9)

## 2018-12-13 LAB — MAGNESIUM: Magnesium: 2 mg/dL (ref 1.7–2.4)

## 2018-12-13 LAB — SARS CORONAVIRUS 2 BY RT PCR (HOSPITAL ORDER, PERFORMED IN ~~LOC~~ HOSPITAL LAB): SARS Coronavirus 2: NEGATIVE

## 2018-12-13 LAB — CALCIUM: Calcium: 7.8 mg/dL — ABNORMAL LOW (ref 8.9–10.3)

## 2018-12-13 LAB — VITAMIN B12: Vitamin B-12: 434 pg/mL (ref 180–914)

## 2018-12-13 LAB — BRAIN NATRIURETIC PEPTIDE: B Natriuretic Peptide: 16.5 pg/mL (ref 0.0–100.0)

## 2018-12-13 LAB — ABO/RH: ABO/RH(D): O POS

## 2018-12-13 LAB — POC OCCULT BLOOD, ED: Fecal Occult Bld: POSITIVE — AB

## 2018-12-13 LAB — PHOSPHORUS: Phosphorus: 3.2 mg/dL (ref 2.5–4.6)

## 2018-12-13 MED ORDER — ONDANSETRON HCL 4 MG/2ML IJ SOLN: 4.0000 mg | Freq: Four times a day (QID) | INTRAMUSCULAR | Status: DC | PRN

## 2018-12-13 MED ORDER — AMLODIPINE BESYLATE 10 MG PO TABS
10.0000 mg | ORAL_TABLET | Freq: Every day | ORAL | Status: DC
  Administered 2018-12-14 – 2018-12-15 (×2): 10 mg via ORAL
  Filled 2018-12-13 (×2): qty 1

## 2018-12-13 MED ORDER — SODIUM CHLORIDE 0.9 % IV SOLN
80.0000 mg | Freq: Once | INTRAVENOUS | Status: DC
  Filled 2018-12-13: qty 80

## 2018-12-13 MED ORDER — KETOROLAC TROMETHAMINE 15 MG/ML IJ SOLN: 15.0000 mg | Freq: Four times a day (QID) | INTRAMUSCULAR | Status: DC | PRN

## 2018-12-13 MED ORDER — MAGNESIUM CITRATE PO SOLN: 1.0000 | Freq: Once | ORAL | Status: DC | PRN

## 2018-12-13 MED ORDER — DOCUSATE SODIUM 100 MG PO CAPS
100.0000 mg | ORAL_CAPSULE | Freq: Two times a day (BID) | ORAL | Status: DC
  Administered 2018-12-14 – 2018-12-22 (×11): 100 mg via ORAL
  Filled 2018-12-13 (×13): qty 1

## 2018-12-13 MED ORDER — SODIUM CHLORIDE 0.9 % IV BOLUS
500.0000 mL | Freq: Once | INTRAVENOUS | Status: AC
  Administered 2018-12-13: 500 mL via INTRAVENOUS

## 2018-12-13 MED ORDER — SODIUM CHLORIDE 0.9 % IV SOLN
8.0000 mg/h | INTRAVENOUS | Status: DC
  Filled 2018-12-13 (×2): qty 80

## 2018-12-13 MED ORDER — ALBUTEROL SULFATE (2.5 MG/3ML) 0.083% IN NEBU: 2.5000 mg | INHALATION_SOLUTION | RESPIRATORY_TRACT | Status: DC | PRN

## 2018-12-13 MED ORDER — SODIUM CHLORIDE 0.9% FLUSH
3.0000 mL | Freq: Two times a day (BID) | INTRAVENOUS | Status: DC
  Administered 2018-12-14 – 2018-12-21 (×15): 3 mL via INTRAVENOUS

## 2018-12-13 MED ORDER — SORBITOL 70 % SOLN
30.0000 mL | Freq: Every day | Status: DC | PRN
  Filled 2018-12-13: qty 30

## 2018-12-13 MED ORDER — ATORVASTATIN CALCIUM 40 MG PO TABS
80.0000 mg | ORAL_TABLET | Freq: Every day | ORAL | Status: DC
  Administered 2018-12-14 – 2018-12-22 (×7): 80 mg via ORAL
  Filled 2018-12-13 (×7): qty 2

## 2018-12-13 MED ORDER — SENNOSIDES-DOCUSATE SODIUM 8.6-50 MG PO TABS: 1.0000 | ORAL_TABLET | Freq: Every evening | ORAL | Status: DC | PRN

## 2018-12-13 MED ORDER — SODIUM CHLORIDE 0.9 % IV SOLN: 10.0000 mL/h | Freq: Once | INTRAVENOUS | Status: DC

## 2018-12-13 MED ORDER — ONDANSETRON HCL 4 MG PO TABS: 4.0000 mg | ORAL_TABLET | Freq: Four times a day (QID) | ORAL | Status: DC | PRN

## 2018-12-13 MED ORDER — SODIUM CHLORIDE 0.9 % IV SOLN
INTRAVENOUS | Status: AC
  Administered 2018-12-13 – 2018-12-14 (×2): via INTRAVENOUS

## 2018-12-13 MED ORDER — ACETAMINOPHEN 650 MG RE SUPP: 650.0000 mg | Freq: Four times a day (QID) | RECTAL | Status: DC | PRN

## 2018-12-13 MED ORDER — HYDROCODONE-ACETAMINOPHEN 5-325 MG PO TABS: 1.0000 | ORAL_TABLET | ORAL | Status: DC | PRN

## 2018-12-13 MED ORDER — ASPIRIN EC 81 MG PO TBEC: 81.0000 mg | DELAYED_RELEASE_TABLET | Freq: Every day | ORAL | Status: DC

## 2018-12-13 MED ORDER — ALLOPURINOL 100 MG PO TABS
300.0000 mg | ORAL_TABLET | Freq: Every day | ORAL | Status: DC
  Administered 2018-12-14 – 2018-12-22 (×7): 300 mg via ORAL
  Filled 2018-12-13 (×2): qty 1
  Filled 2018-12-13 (×5): qty 3

## 2018-12-13 MED ORDER — ACETAMINOPHEN 325 MG PO TABS: 650.0000 mg | ORAL_TABLET | Freq: Four times a day (QID) | ORAL | Status: DC | PRN

## 2018-12-13 MED ORDER — PANTOPRAZOLE SODIUM 40 MG IV SOLR: 40.0000 mg | Freq: Two times a day (BID) | INTRAVENOUS | Status: DC

## 2018-12-13 MED ORDER — LEVALBUTEROL HCL 0.63 MG/3ML IN NEBU: 0.6300 mg | INHALATION_SOLUTION | Freq: Four times a day (QID) | RESPIRATORY_TRACT | Status: DC | PRN

## 2018-12-13 NOTE — ED Triage Notes (Signed)
Pt BIB EMS from home. Pt c/o abdominal pain x 1 week. Pt c/o weakness today. Pt reports bowel movement today, pt reports passing out on toilet, denies falling off toilet and hitting head.  BP 110/69 CBG 123  18 L AC

## 2018-12-13 NOTE — Consult Note (Signed)
Referring Provider:  Dr. Alona BeneJoshua Long [facility]) Primary Care Physician:  Cloward, Laban Emperoravis L, MD [facility]  [facility]  Reason for Consultation: GI bleeding  HPI: [name] is a 63 y.o. male with no prior GI history or history of ulcers or GI bleeding, who gives a 5-day history of bloody stools, including several this morning, to the point of syncope, without significant abdominal pain (by his report, despite ER triage note to the contrary).    Patient has known history of diverticulosis based on CT from 2009, plus, by his report, diverticulosis being found on [facility] (no [facility] currently available).  Patient is also on primary prophylaxis with daily 81 mg aspirin without PPI coverage.  However, no significant prodromal upper GI tract symptoms.  No history of ethanol consumption for the past 30 years.  Non-smoker.  In the emergency room, the patient's hemoglobin was 7.6 with an MCV of 102, platelets normal; for comparison, 6 years ago his hemoglobin was 14.4.  The emergency department physician indicated that, on digital exam, the patient's stool was burgundy in color, not frankly melenic, and the patient's BUN on admission was normal at 23.  He has not had any bowel movements for several hours now.   Past Medical History:  Diagnosis Date  . 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 Malignant hypertension   . Mitral valve anterior leaflet prolapse   . Mixed hyperlipidemia     Past Surgical History:  Procedure Laterality Date  . none      Prior to Admission medications   Medication Sig Start Date End Date Taking? Authorizing Provider  allopurinol (ZYLOPRIM) 300 MG tablet Take 300 mg by mouth daily.   Yes [provider]  amLODipine (NORVASC) 10 MG tablet Take 10 mg by mouth daily.   Yes [provider]  Ascorbic Acid (VITAMIN C) 100 MG tablet Take 100 mg by mouth daily.    Yes [provider]  aspirin 81 MG tablet Take 81 mg by mouth daily.   Yes [provider]  atorvastatin (LIPITOR) 80 MG tablet Take 80 mg by mouth daily.   Yes [provider]  cholecalciferol (VITAMIN D) 400 UNITS TABS tablet Take 400 Units by mouth daily.   Yes [provider]  Dimethyl Fumarate (TECFIDERA) 240 MG CPDR Take 240 mg by mouth 2 (two) times daily.   Yes [provider]  Hydrochlorothiazide (MICROZIDE PO) Take 25 mg by mouth daily.   Yes [provider]  lisinopril (PRINIVIL,ZESTRIL) 40 MG tablet Take 40 mg by mouth daily.   Yes [provider]  METFORMIN HCL PO Take 500 mg by mouth daily.    Yes [provider]  Omega-3 Fatty Acids (FISH OIL) 1000 MG CAPS Take 3,000 mg by mouth.    Yes [provider]  potassium chloride SA (K-DUR) 20 MEQ tablet Take 20 mEq by mouth daily.   Yes [provider]  sildenafil (VIAGRA) 100 MG tablet Take 100 mg by mouth daily as needed for erectile dysfunction.   Yes [provider]    Current Facility-Administered Medications  Medication Dose Route Frequency Provider Last Rate Last Dose  . 0.9 %  sodium chloride infusion  10 mL/hr Intravenous Once Long, Arlyss RepressJoshua G, MD      . 0.9 %  sodium chloride infusion   Intravenous Continuous Shahmehdi, Gemma PayorSeyed A, MD      .  acetaminophen (TYLENOL) tablet 650 mg  650 mg Oral Q6H PRN Shahmehdi, Seyed A, MD       Or  . acetaminophen (TYLENOL) suppository 650 mg  650 mg Rectal Q6H PRN Shahmehdi, Seyed A, MD      . albuterol (PROVENTIL) (2.5 MG/3ML) 0.083% nebulizer solution 2.5 mg  2.5 mg Nebulization Q2H PRN Kendell Bane, MD      . Melene Muller ON 12/14/2018] allopurinol (ZYLOPRIM) tablet 300 mg  300 mg Oral Daily Shahmehdi, Seyed A, MD      . Melene Muller ON 12/14/2018] amLODipine (NORVASC) tablet 10 mg  10 mg Oral Daily Shahmehdi, Seyed A, MD      . aspirin EC tablet 81 mg  81 mg Oral Daily Shahmehdi, Seyed A, MD      .  Melene Muller ON 12/14/2018] atorvastatin (LIPITOR) tablet 80 mg  80 mg Oral Daily Shahmehdi, Seyed A, MD      . docusate sodium (COLACE) capsule 100 mg  100 mg Oral BID Shahmehdi, Seyed A, MD      . HYDROcodone-acetaminophen (NORCO/VICODIN) 5-325 MG per tablet 1-2 tablet  1-2 tablet Oral Q4H PRN Shahmehdi, Seyed A, MD      . ketorolac (TORADOL) 15 MG/ML injection 15 mg  15 mg Intravenous Q6H PRN Shahmehdi, Seyed A, MD      . levalbuterol (XOPENEX) nebulizer solution 0.63 mg  0.63 mg Nebulization Q6H PRN Shahmehdi, Seyed A, MD      . magnesium citrate solution 1 Bottle  1 Bottle Oral Once PRN Shahmehdi, Seyed A, MD      . ondansetron (ZOFRAN) tablet 4 mg  4 mg Oral Q6H PRN Shahmehdi, Seyed A, MD       Or  . ondansetron (ZOFRAN) injection 4 mg  4 mg Intravenous Q6H PRN Shahmehdi, Seyed A, MD      . pantoprazole (PROTONIX) 80 mg in sodium chloride 0.9 % 100 mL IVPB  80 mg Intravenous Once Long, Arlyss Repress, MD      . senna-docusate (Senokot-S) tablet 1 tablet  1 tablet Oral QHS PRN Shahmehdi, Seyed A, MD      . sodium chloride flush (NS) 0.9 % injection 3 mL  3 mL Intravenous Q12H Shahmehdi, Seyed A, MD      . sorbitol 70 % solution 30 mL  30 mL Oral Daily PRN Kendell Bane, MD       Current Outpatient Medications  Medication Sig Dispense Refill  . allopurinol (ZYLOPRIM) 300 MG tablet Take 300 mg by mouth daily.    Marland Kitchen amLODipine (NORVASC) 10 MG tablet Take 10 mg by mouth daily.    . Ascorbic Acid (VITAMIN C) 100 MG tablet Take 100 mg by mouth daily.    Marland Kitchen aspirin 81 MG tablet Take 81 mg by mouth daily.    Marland Kitchen atorvastatin (LIPITOR) 80 MG tablet Take 80 mg by mouth daily.    . cholecalciferol (VITAMIN D) 400 UNITS TABS tablet Take 400 Units by mouth daily.    . Dimethyl Fumarate (TECFIDERA) 240 MG CPDR Take 240 mg by mouth 2 (two) times daily.    . Hydrochlorothiazide (MICROZIDE PO) Take 25 mg by mouth daily.    Marland Kitchen lisinopril (PRINIVIL,ZESTRIL) 40 MG tablet Take 40 mg by mouth daily.    Marland Kitchen METFORMIN HCL PO  Take 500 mg by mouth daily.     . Omega-3 Fatty Acids (FISH OIL) 1000 MG CAPS Take 3,000 mg by mouth.     . potassium chloride SA (K-DUR) 20 MEQ tablet  Take 20 mEq by mouth daily.    . sildenafil (VIAGRA) 100 MG tablet Take 100 mg by mouth daily as needed for erectile dysfunction.      Allergies as of 12/13/2018  . (No Known Allergies)    Family History  Problem Relation Age of Onset  . Hypertension Mother   . Hypertension Father     Social History   Socioeconomic History  . Marital status: Married    Spouse name: Patiricia  . Number of children: 1  . Years of education: college  . Highest education level: Not on file  Occupational History  . Not on file  Social Needs  . Financial resource strain: Not on file  . Food insecurity    Worry: Not on file    Inability: Not on file  . Transportation needs    Medical: Not on file    Non-medical: Not on file  Tobacco Use  . Smoking status: Never Smoker  . Smokeless tobacco: Never Used  Substance and Sexual Activity  . Alcohol use: No  . Drug use: No  . Sexual activity: Not on file  Lifestyle  . Physical activity    Days per week: Not on file    Minutes per session: Not on file  . Stress: Not on file  Relationships  . Social Musicianconnections    Talks on phone: Not on file    Gets together: Not on file    Attends religious service: Not on file    Active member of club or organization: Not on file    Attends meetings of clubs or organizations: Not on file    Relationship status: Not on file  . Intimate partner violence    Fear of current or ex partner: Not on file    Emotionally abused: Not on file    Physically abused: Not on file    Forced sexual activity: Not on file  Other Topics Concern  . Not on file  Social History Narrative   Patient lives at home with wife Elease Hashimoto(Patricia)   Patient is right handed   Education college   Caffeine consumption is 1 cup daily    Review of Systems: Negative for upper tract symptoms  such as heartburn, esophageal dysphagia, nausea or vomiting, anorexia or weight loss.  Does have a tendency toward constipation managed by stool softener and senna [facility].  No chest pain, shortness of breath, skin rashes, or focal neurologic symptoms apart from possibly some deviation of his eye from time to time (patient has stated history of MS for 38 years)  Physical Exam: Vital signs in last 24 hours: Temp:  [97.8 F (36.6 C)] 97.8 F (36.6 C) (07/20 1402) Pulse Rate:  [87-96] 96 (07/20 1402) Resp:  [17-23] 18 (07/20 1402) BP: (113-148)/(56-71) 132/71 (07/20 1402) SpO2:  [98 %-100 %] 98 % (07/20 1345)   General:   Alert,  Well-developed, well-nourished, pleasant and cooperative in NAD Head:  Normocephalic and atraumatic. Eyes:  Sclera clear, no icterus.   Conjunctiva pale. Mouth:   No ulcerations or lesions.  Oropharynx pink & moist. Lungs:  Clear throughout to auscultation.   No wheezes, crackles, or rhonchi. No evident respiratory distress. Heart:   Regular rate and rhythm; no murmurs, clicks, rubs,  or gallops. Abdomen:  Soft, nontender, and nondistended. No masses, hepatosplenomegaly or ventral hernias noted. Diastasis recti present. Normal bowel sounds, without bruits, guarding, or rebound.   Rectal:  Per  EDP, showed maroon/burgundy stool.  Msk:   Symmetrical without gross deformities. Pulses:  Normal radial pulse is noted. Extremities:   Without edema. Neurologic:  Alert and coherent;  grossly normal neurologically. Skin:  Intact without significant lesions or rashes. Cervical Nodes:  No significant cervical adenopathy. Psych:   Alert and cooperative. Normal mood and affect.  Intake/Output from previous day: No intake/output data recorded. Intake/Output this shift: Total I/O In: 350 [Blood:350] Out: -   Lab Results: Recent Labs    12/13/18 1131  WBC 9.2  HGB 7.6*  HCT 23.2*  PLT 192   BMET Recent Labs    12/13/18 1131  NA 140  K  3.4*  CL 105  CO2 25  GLUCOSE 189*  BUN 23  CREATININE 1.11  CALCIUM 8.0*   LFT Recent Labs    12/13/18 1131  PROT 5.2*  ALBUMIN 3.0*  AST 25  ALT 22  ALKPHOS 47  BILITOT 0.3   PT/INR Recent Labs    12/13/18 1131  LABPROT 13.9  INR 1.1    Studies/Results: Dg Chest Portable 1 View  Result Date: 12/13/2018 CLINICAL DATA:  Syncopal episode today. EXAM: PORTABLE CHEST 1 VIEW COMPARISON:  05/09/2015. FINDINGS: Normal sized heart. Tortuous aorta. Clear lungs with normal vascularity. Thoracic spine degenerative changes. No fracture or pneumothorax seen. IMPRESSION: No acute abnormality. Electronically Signed   By: Claudie Revering M.D.   On: 12/13/2018 11:57    Impression: 1.  Subacute GI bleed, indeterminate origin, suspect diverticular given normal BUN and burgundy appearance of the stool.  Although at risk for upper tract hemorrhage by virtue of aspirin exposure, I think that is less likely 2.  Posthemorrhagic anemia, moderate  Plan: 1.  Supportive care, including transfusion as already ordered 2.  Protonix infusion (ordered) to follow-up on previously administered bolus, to cover the patient in the event that an ulcer is present 3.  Have stopped aspirin since it is only being given for primary prophylaxis and may augment hemorrhage by its blood thinning effect. 4.  Have taken Toradol off the patient's routine admitting orders because of potential ulcerogenic and antiplatelet effect 5.  Consider possible endoscopy tomorrow, depending on patient's clinical evolution.  We cannot do it today because of the fact that we do not have the patient's COVID test result back yet.  Fortunately, the patient is clinically stable at the present time, with stable vital signs, no distress, and no active bleeding, so our hand is not forced to do emergent endoscopic evaluation. 6.  Will allow clear liquids today, but keep the patient NPO after midnight in case we decide to do endoscopy tomorrow.  I  did discuss that possibility with the patient. 7.  Have discussed patient's condition, possible causes for bleeding, and possible options for management with the patient's daughter by telephone.   LOS: 0 days   Youlanda Mighty Sheretha Shadd  12/13/2018, 2:34 PM   Pager [phone] If no answer or after 5 PM call [phone]

## 2018-12-13 NOTE — ED Notes (Signed)
Report given to [place], [place]

## 2018-12-13 NOTE — ED Provider Notes (Signed)
f  Emergency Department Provider Note   I have reviewed the triage vital signs and the nursing notes.   HISTORY  Chief Complaint Loss of Consciousness and Abdominal Pain   HPI James Gibbs is a 63 y.o. male with PMH of HTN and [facility] emergency department for evaluation of generalized weakness, syncope, and blood in the bowel movements this morning.  Patient states he began feeling poorly yesterday.  He called his daughter who came to check on him and stay with him overnight.  She states that yesterday evening he was seeming like he was "going in and out" and diaphoretic at times.  No fevers.  They called EMS yesterday who arrived and took the patient's BP and found it to be slightly low into the 90s systolic according to patient and daughter.  They were not transported to the hospital at that time.  He has not had fevers, chest pain, shortness of breath.  This morning he felt generalized weakness again and also the need to go to the bathroom.  He sat on the commode and had 2 large bowel movements with some blood and dark stool.  The daughter states that he had been given some stool softener due to constipation and over the past 3 days has had diarrhea type bowel movements but that this morning is the first time she noticed the blood. Patient denies any abdominal pain.   Patient denies any history of GI bleeding.  He states that he is due for a colonoscopy this year but does not have a regular gastroenterologist.  He takes a baby aspirin but no other antiplatelet or anticoagulation medications.   Past Medical History:  Diagnosis Date  . 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 Malignant hypertension   . Mitral valve anterior leaflet prolapse   . Mixed hyperlipidemia     Patient Active Problem List   Diagnosis Date Noted  . GIB (gastrointestinal bleeding) 12/13/2018  . HTN (hypertension) 12/13/2018  . Syncope, vasovagal  12/13/2018  . Hypotension 12/13/2018  . Gout 12/13/2018  . HYPERTENSION, UNCONTROLLED 06/04/2009  . OBESITY, UNSPECIFIED 08/15/2008  . MITRAL REGURGITATION 08/15/2008  . MITRAL VALVE PROLAPSE 08/15/2008    Past Surgical History:  Procedure Laterality Date  . none      Allergies Patient has no known allergies.  Family History  Problem Relation Age of Onset  . Hypertension Mother   . Hypertension Father     Social History Social History   Tobacco Use  . Smoking status: Never Smoker  . Smokeless tobacco: Never Used  Substance Use Topics  . Alcohol use: No  . Drug use: No    Review of Systems  Constitutional: No fever/chills Eyes: No visual changes. ENT: No sore throat. Cardiovascular: Denies chest pain. Respiratory: Denies shortness of breath. Gastrointestinal: No abdominal pain.  No nausea, no vomiting. Positive diarrhea with BRBPR.  No constipation. Genitourinary: Negative for dysuria. Musculoskeletal: Negative for back pain. Skin: Negative for rash. Neurological: Negative for headaches, focal weakness or numbness.  10-point ROS otherwise negative.  ____________________________________________   PHYSICAL EXAM:  VITAL SIGNS: ED Triage Vitals [12/13/18 1138]  Enc Vitals Group     BP (!) 117/56     Pulse Rate 87     Resp (!) 23     Temp 97.8 F (36.6 C)     Temp Source Oral     SpO2 99 %   Constitutional: Alert and oriented.  Well appearing and in no acute distress. Eyes: Conjunctivae are normal.  Head: Atraumatic. Nose: No congestion/rhinnorhea. Mouth/Throat: Mucous membranes are moist. Neck: No stridor.  Cardiovascular: Normal rate, regular rhythm. Good peripheral circulation. Grossly normal heart sounds.   Respiratory: Normal respiratory effort.  No retractions. Lungs CTAB. Gastrointestinal: Soft and nontender. No distention. Rectal exam performed with nurse chaperone and after verbal consent from patient. BRBPR noted. No melena.   Musculoskeletal: No gross deformities of extremities. Neurologic:  Normal speech and language. No gross focal neurologic deficits are appreciated.  Skin:  Skin is warm, dry and intact. No rash noted.  ____________________________________________   LABS (all labs ordered are listed, but only abnormal results are displayed)  Labs Reviewed  COMPREHENSIVE METABOLIC PANEL - Abnormal; Notable for the following components:      Result Value   Potassium 3.4 (*)    Glucose, Bld 189 (*)    Calcium 8.0 (*)    Total Protein 5.2 (*)    Albumin 3.0 (*)    All other components within normal limits  CBC WITH DIFFERENTIAL/PLATELET - Abnormal; Notable for the following components:   RBC 2.28 (*)    Hemoglobin 7.6 (*)    HCT 23.2 (*)    MCV 101.8 (*)    RDW 15.9 (*)    All other components within normal limits  FERRITIN - Abnormal; Notable for the following components:   Ferritin 20 (*)    All other components within normal limits  IRON AND TIBC - Abnormal; Notable for the following components:   TIBC 217 (*)    All other components within normal limits  RETICULOCYTES - Abnormal; Notable for the following components:   Retic Ct Pct 5.0 (*)    RBC. 2.25 (*)    Immature Retic Fract 28.0 (*)    All other components within normal limits  CALCIUM - Abnormal; Notable for the following components:   Calcium 7.8 (*)    All other components within normal limits  HEMOGLOBIN AND HEMATOCRIT, BLOOD - Abnormal; Notable for the following components:   Hemoglobin 8.5 (*)    HCT 25.9 (*)    All other components within normal limits  CBC - Abnormal; Notable for the following components:   RBC 2.63 (*)    Hemoglobin 8.3 (*)    HCT 26.1 (*)    RDW 17.6 (*)    Platelets 140 (*)    All other components within normal limits  HEMOGLOBIN AND HEMATOCRIT, BLOOD - Abnormal; Notable for the following components:   Hemoglobin 8.5 (*)    HCT 25.8 (*)    All other components within normal limits  BASIC METABOLIC  PANEL - Abnormal; Notable for the following components:   Potassium 3.4 (*)    Glucose, Bld 102 (*)    Calcium 7.9 (*)    All other components within normal limits  POC OCCULT BLOOD, ED - Abnormal; Notable for the following components:   Fecal Occult Bld POSITIVE (*)    All other components within normal limits  SARS CORONAVIRUS 2 (HOSPITAL ORDER, [facility])  URINE CULTURE  LIPASE, BLOOD  PROTIME-INR  URINALYSIS, ROUTINE W REFLEX MICROSCOPIC  FOLATE  VITAMIN B12  MAGNESIUM  PHOSPHORUS  BRAIN NATRIURETIC PEPTIDE  TSH  HEMOGLOBIN A1C  PROTIME-INR  APTT  GLUCOSE, CAPILLARY  HIV ANTIBODY (ROUTINE TESTING W REFLEX)  URINALYSIS, ROUTINE W REFLEX MICROSCOPIC  HEMOGLOBIN AND HEMATOCRIT, BLOOD  HEMOGLOBIN AND HEMATOCRIT, BLOOD  TYPE AND SCREEN  PREPARE RBC (CROSSMATCH)  ABO/RH  PREPARE RBC (CROSSMATCH)   ____________________________________________  EKG   EKG Interpretation  Date/Time:  Monday December 13 2018 11:38:29 EDT Ventricular Rate:  89 PR Interval:    QRS Duration: 87 QT Interval:  363 QTC Calculation: 442 R Axis:   49 Text Interpretation:  Sinus rhythm Abnormal R-wave progression, early transition Nonspecific repol abnormality, diffuse leads No STEMI  Confirmed by Alona Bene [phone]) on 12/13/2018 11:43:07 AM       ____________________________________________  RADIOLOGY  Dg Chest Portable 1 View  Result Date: 12/13/2018 CLINICAL DATA:  Syncopal episode today. EXAM: PORTABLE CHEST 1 VIEW COMPARISON:  05/09/2015. FINDINGS: Normal sized heart. Tortuous aorta. Clear lungs with normal vascularity. Thoracic spine degenerative changes. No fracture or pneumothorax seen. IMPRESSION: No acute abnormality. Electronically Signed   By: Beckie Salts M.D.   On: 12/13/2018 11:57    ____________________________________________   PROCEDURES  Procedure(s) performed:   Procedures  CRITICAL CARE Performed by: Maia Plan Total critical care  time: 35 minutes Critical care time was exclusive of separately billable procedures and treating other patients. Critical care was necessary to treat or prevent imminent or life-threatening deterioration. Critical care was time spent personally by me on the following activities: development of treatment plan with patient and/or surrogate as well as nursing, discussions with consultants, evaluation of patient's response to treatment, examination of patient, obtaining history from patient or surrogate, ordering and performing treatments and interventions, ordering and review of laboratory studies, ordering and review of radiographic studies, pulse oximetry and re-evaluation of patient's condition.  Alona Bene, MD Emergency Medicine  ____________________________________________   INITIAL IMPRESSION / ASSESSMENT AND PLAN / ED COURSE  Pertinent labs & imaging results that were available during my care of the patient were reviewed by me and considered in my medical decision making (see chart for details).   Patient presents to the emergency department for evaluation of generalized weakness with blood in the toilet this morning.  Patient with hypotension on scene according to EMS and syncope type presentation at home.  Appears to been symptomatic for at least the last 24 hours.  Patient is on baby aspirin only.  He is hemodynamically stable here in the emergency department.  His hemoglobin is 7.6.  Given his significant symptoms I have elected to transfuse 1 unit PRBC here in the ED. I have sent anemia panel and given IV Protonix.  No focal tenderness on abdominal exam.  No abdominal imaging at this time.  I will discuss with GI.   Spoke with Dr. Matthias Hughs regarding the patient's labs and exam findings. They will consult. Labs reviewed. Discussed CT but without abdominal pain or tenderness will defer imaging for now.   Discussed patient's case with Hospitalist to request admission. Patient and family (if  present) updated with plan. Care transferred to Hospitalist service.  I reviewed all nursing notes, vitals, pertinent old records, EKGs, labs, imaging (as available).  ____________________________________________  FINAL CLINICAL IMPRESSION(S) / ED DIAGNOSES  Final diagnoses:  Syncope and collapse  Hypotension, unspecified hypotension type  Gastrointestinal hemorrhage, unspecified gastrointestinal hemorrhage type     MEDICATIONS GIVEN DURING THIS VISIT:  Medications  pantoprazole (PROTONIX) 80 mg in sodium chloride 0.9 % 100 mL IVPB (has no administration in time range)  0.9 %  sodium chloride infusion (10 mL/hr Intravenous Not Given 12/13/18 1628)  sodium chloride flush (NS) 0.9 % injection 3 mL (3 mLs Intravenous Not Given 12/14/18 0953)  0.9 %  sodium chloride infusion ( Intravenous New Bag/Given 12/14/18  [id number])  acetaminophen (TYLENOL) tablet 650 mg (has no administration in time range)    Or  acetaminophen (TYLENOL) suppository 650 mg (has no administration in time range)  HYDROcodone-acetaminophen (NORCO/VICODIN) 5-325 MG per tablet 1-2 tablet (has no administration in time range)  docusate sodium (COLACE) capsule 100 mg (100 mg Oral Given 12/14/18 0948)  senna-docusate (Senokot-S) tablet 1 tablet (has no administration in time range)  sorbitol 70 % solution 30 mL (has no administration in time range)  magnesium citrate solution 1 Bottle (has no administration in time range)  ondansetron (ZOFRAN) tablet 4 mg (has no administration in time range)    Or  ondansetron (ZOFRAN) injection 4 mg (has no administration in time range)  albuterol (PROVENTIL) (2.5 MG/3ML) 0.083% nebulizer solution 2.5 mg (has no administration in time range)  levalbuterol (XOPENEX) nebulizer solution 0.63 mg (has no administration in time range)  allopurinol (ZYLOPRIM) tablet 300 mg (300 mg Oral Given 12/14/18 0948)  amLODipine (NORVASC) tablet 10 mg (10 mg Oral Given 12/14/18 0948)  atorvastatin (LIPITOR)  tablet 80 mg (80 mg Oral Given 12/14/18 0948)  pantoprazole (PROTONIX) 80 mg in sodium chloride 0.9 % 250 mL (0.32 mg/mL) infusion (has no administration in time range)  pantoprazole (PROTONIX) injection 40 mg (has no administration in time range)  potassium chloride 10 mEq in 100 mL IVPB (10 mEq Intravenous New Bag/Given 12/14/18 0952)  sodium chloride 0.9 % bolus 500 mL (0 mLs Intravenous Stopped 12/13/18 1307)    Note:  This document was prepared using Dragon voice recognition software and may include unintentional dictation errors.  Alona BeneJoshua Jakaria Lavergne, MD Emergency Medicine    Demontray Franta, Arlyss RepressJoshua G, MD 12/14/18 1010

## 2018-12-13 NOTE — H&P (Signed)
History and Physical   Patient: James Gibbs                            PCP: Lavell Islam, MD                    DOB: Jun 18, 1955            DOA: 12/13/2018 VOJ:[id number]             DOS: 12/13/2018, 1:49 PM  Patient coming from:   HOME  I have personally reviewed patient's medical records, in electronic medical records, including: [facility], and care everywhere.    Chief Complaint:   Chief Complaint  Patient presents with  . Loss of Consciousness  . Abdominal Pain    History of present illness:    James Gibbs is a 63 y.o. male with medical history significant of 63 year old male with past medical history of hypertension, hyperlipidemia, mitral valve prolapse,DM II,  presenting with questionable syncope presyncopal episode, noting blood in stool.  Patient Denies having: Fever, Chills, Cough, SOB, Chest Pain, Abd pain, N/V/D, headache, dizziness, lightheadedness, joint pain, rash, open wounds  ED Course:   Mildly hypotensive, H&H down from baseline 2 units of packed red blood cell has been ordered pending transfusion GI Dr. Matthias Hughs has been consulted pending evaluation recommendations.  Patient to be admitted for GI evaluation, anemia due to GI bleed blood transfusion.  Review of Systems: As per HPI, otherwise 10 point review of systems were negative.   ----------------------------------------------------------------------------------------------------------------------  No Known Allergies  Home MEDs:  Prior to Admission medications   Medication Sig Start Date End Date Taking? Authorizing Provider  allopurinol (ZYLOPRIM) 300 MG tablet Take 300 mg by mouth daily.   Yes [provider]  amLODipine (NORVASC) 10 MG tablet Take 10 mg by mouth daily.   Yes [provider]  Ascorbic Acid (VITAMIN C) 100 MG tablet Take 100 mg by mouth daily.   Yes [provider]  aspirin 81 MG tablet Take 81 mg by mouth daily.   Yes [provider]  atorvastatin (LIPITOR) 80 MG tablet Take 80 mg by mouth daily.   Yes [provider]  cholecalciferol (VITAMIN D) 400 UNITS TABS tablet Take 400 Units by mouth daily.   Yes [provider]  Dimethyl Fumarate (TECFIDERA) 240 MG CPDR Take 240 mg by mouth 2 (two) times daily.   Yes [provider]  Hydrochlorothiazide (MICROZIDE PO) Take 25 mg by mouth daily.   Yes [provider]  lisinopril (PRINIVIL,ZESTRIL) 40 MG tablet Take 40 mg by mouth daily.   Yes [provider]  METFORMIN HCL PO Take 500 mg by mouth daily.    Yes [provider]  Omega-3 Fatty Acids (FISH OIL) 1000 MG CAPS Take 3,000 mg by mouth.    Yes [provider]  potassium chloride SA (K-DUR) 20 MEQ tablet Take 20 mEq by mouth daily.   Yes [provider]  sildenafil (VIAGRA) 100 MG tablet Take 100 mg by mouth daily as needed for erectile dysfunction.   Yes [provider]    PRN MEDs: acetaminophen **OR** acetaminophen, albuterol, HYDROcodone-acetaminophen, ketorolac, levalbuterol, magnesium citrate, ondansetron **OR** ondansetron (ZOFRAN) IV, senna-docusate, sorbitol  Past Medical History:  Diagnosis Date  . 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 Malignant hypertension   . Mitral valve anterior leaflet prolapse   . Mixed hyperlipidemia  Past Surgical History:  Procedure Laterality Date  . none       reports that he has never smoked. He has never used smokeless tobacco. He reports that he does not drink alcohol or use drugs.   Family History  Problem Relation Age of Onset  . Hypertension Mother   . Hypertension Father     Physical Exam:   Vitals:   12/13/18 1138 12/13/18 1145 12/13/18 1253 12/13/18 1302  BP: (!) 117/56 (!) 113/57 (!) 148/67 121/62  Pulse: 87 91 90 88  Resp: (!) 23 (!) 22 (!) 23 17  Temp: 97.8 F (36.6 C)     TempSrc: Oral     SpO2: 99% 100% 99% 98%   Constitutional:  NAD, calm, comfortable Eyes: PERRL, lids and conjunctivae normal ENMT: Mucous membranes are moist. Posterior pharynx clear of any exudate or lesions.Normal dentition.  Neck: normal, supple, no masses, no thyromegaly Respiratory: clear to auscultation bilaterally, no wheezing, no crackles. Normal respiratory effort. No accessory muscle use.  Cardiovascular: Regular rate and rhythm, no murmurs / rubs / gallops. No extremity edema. 2+ pedal pulses. No carotid bruits.  Abdomen: no tenderness, no masses palpated. No hepatosplenomegaly. Bowel sounds positive.  Musculoskeletal: no clubbing / cyanosis. No joint deformity upper and lower extremities. Good ROM, no contractures. Normal muscle tone.  Neurologic: CN II-XII grossly intact. Sensation intact, DTR normal. Strength 5/5 in all 4.  Psychiatric: Normal judgment and insight. Alert and oriented x 3. Normal mood.  Skin: no rashes, lesions, ulcers. No induration   Labs on admission:    I have personally reviewed following labs and imaging studies  CBC: Recent Labs  Lab 12/13/18 1131  WBC 9.2  NEUTROABS 6.6  HGB 7.6*  HCT 23.2*  MCV 101.8*  PLT 192   Basic Metabolic Panel: Recent Labs  Lab 12/13/18 1131  NA 140  K 3.4*  CL 105  CO2 25  GLUCOSE 189*  BUN 23  CREATININE 1.11  CALCIUM 8.0*   GFR: CrCl cannot be calculated (Unknown ideal weight.). Liver Function Tests: Recent Labs  Lab 12/13/18 1131  AST 25  ALT 22  ALKPHOS 47  BILITOT 0.3  PROT 5.2*  ALBUMIN 3.0*   Recent Labs  Lab 12/13/18 1131  LIPASE 25   No results for input(s): AMMONIA in the last 168 hours. Coagulation Profile: Recent Labs  Lab 12/13/18 1131  INR 1.1   Anemia Panel: Recent Labs    12/13/18 1131  RETICCTPCT 5.0*   Urine analysis:    Component Value Date/Time   COLORURINE YELLOW 12/13/2018 1142   APPEARANCEUR CLEAR 12/13/2018 1142   LABSPEC 1.013 12/13/2018 1142   PHURINE 6.0 12/13/2018 1142   GLUCOSEU NEGATIVE 12/13/2018 1142    HGBUR NEGATIVE 12/13/2018 1142   BILIRUBINUR NEGATIVE 12/13/2018 1142   BILIRUBINUR neg 05/10/2013 2025   KETONESUR NEGATIVE 12/13/2018 1142   PROTEINUR NEGATIVE 12/13/2018 1142   UROBILINOGEN 0.2 05/10/2013 2025   NITRITE NEGATIVE 12/13/2018 1142   LEUKOCYTESUR NEGATIVE 12/13/2018 1142     Radiologic Exams on Admission:   Dg Chest Portable 1 View  Result Date: 12/13/2018 CLINICAL DATA:  Syncopal episode today. EXAM: PORTABLE CHEST 1 VIEW COMPARISON:  05/09/2015. FINDINGS: Normal sized heart. Tortuous aorta. Clear lungs with normal vascularity. Thoracic spine degenerative changes. No fracture or pneumothorax seen. IMPRESSION: No acute abnormality. Electronically Signed   By: Beckie SaltsSteven  Reid M.D.   On: 12/13/2018 11:57    EKG:   Independently reviewed.   Orders placed or performed during  the hospital encounter of 12/13/18  . EKG 12-Lead  . EKG 12-Lead  . ED EKG  . ED EKG  . EKG 12-Lead   --------------------------------------------------------------------------------------------------------------------------    Assessment / Plan:   Principal Problem: Acute GIB (gastrointestinal bleeding) -Patient will be admitted to monitored floor, for close observation -Continue gentle IV fluid hydration -Since patient is symptomatic with mild hypotension,, tachycardia, syncope question of presyncope episode --patient will be transfused with packed red blood cells -2 units of packed red blood cell has been ordered in ED pending transfusion GI Dr. Cristina Gong has been consulted pending evaluation recommendations. -Continue Protonix drip -Holding home medication of aspirin -Keep patient n.p.o. -Monitoring H&H every 6  Presyncope/syncopal episode -Vasovagal, due to above, GI bleed -Continue to monitor, neurochecks -Treatment as above -If any further episode despite above treatment, we will consider considering CT of the head, carotid study and echo    Active Problems:   MITRAL  REGURGITATION -Currently stable, -Monitoring closely, gentle IV fluid hydration  Mild hypotension likely due to volume depletion, GI bleed /history of hypertension -Continue gentle IV fluid hydration, Will holding blood pressure medication including Norvasc, HCTZ, lisinopril,  Hyperlipidemia -Continue statins  Diabetes mellitus type II -Withholding home medication of metformin -Checking blood sugar before while n.p.o. then QA CHS, SSI    DVT prophylaxis: SCD/Compression stockings Code Status:   Code Status: Full Code  Family Communication:  The above findings and plan of care has been discussed with patient  in detail, they expressed understanding and agreement of above plan.   Disposition Plan: >3 days Consults called:  GI Dr. Cristina Gong   Admission status: Patient will be admitted as Inpatient, with a greater than 2 midnight length of stay.   Cultures:  - Antimicrobial: -  ---------------------------------------------------------------------------------------------------------------------------------------------------------------------------------------------------------------------------------------  Time spent: > than  65  Min.   SIGNED: Deatra James, MD, FACP, FHM. [facility],  Pager [phone]  If 7PM-7AM, please contact night-coverage [email or web address] Ota [facility] 12/13/2018, 1:49 PM

## 2018-12-14 ENCOUNTER — Inpatient Hospital Stay (HOSPITAL_COMMUNITY): Payer: Medicare HMO | Admitting: Anesthesiology

## 2018-12-14 ENCOUNTER — Encounter (HOSPITAL_COMMUNITY): Payer: Self-pay | Admitting: *Deleted

## 2018-12-14 ENCOUNTER — Encounter (HOSPITAL_COMMUNITY)
Admission: EM | Disposition: A | Discharge status: Home Health | Payer: Self-pay | Source: Home / Self Care | Attending: Internal Medicine

## 2018-12-14 DIAGNOSIS — I959 Hypotension, unspecified: Secondary | ICD-10-CM

## 2018-12-14 DIAGNOSIS — D539 Nutritional anemia, unspecified: Secondary | ICD-10-CM

## 2018-12-14 DIAGNOSIS — K921 Melena: Principal | ICD-10-CM

## 2018-12-14 DIAGNOSIS — K5731 Diverticulosis of large intestine without perforation or abscess with bleeding: Secondary | ICD-10-CM

## 2018-12-14 HISTORY — PX: ESOPHAGOGASTRODUODENOSCOPY (EGD) WITH PROPOFOL: SHX5813

## 2018-12-14 LAB — CBC
HCT: 26.1 % — ABNORMAL LOW (ref 39.0–52.0)
Hemoglobin: 8.3 g/dL — ABNORMAL LOW (ref 13.0–17.0)
MCH: 31.6 pg (ref 26.0–34.0)
MCHC: 31.8 g/dL (ref 30.0–36.0)
MCV: 99.2 fL (ref 80.0–100.0)
Platelets: 140 10*3/uL — ABNORMAL LOW (ref 150–400)
RBC: 2.63 MIL/uL — ABNORMAL LOW (ref 4.22–5.81)
RDW: 17.6 % — ABNORMAL HIGH (ref 11.5–15.5)
WBC: 7.2 10*3/uL (ref 4.0–10.5)
nRBC: 0 % (ref 0.0–0.2)

## 2018-12-14 LAB — HEMOGLOBIN AND HEMATOCRIT, BLOOD
HCT: 25.8 % — ABNORMAL LOW (ref 39.0–52.0)
HCT: 26.6 % — ABNORMAL LOW (ref 39.0–52.0)
Hemoglobin: 8.5 g/dL — ABNORMAL LOW (ref 13.0–17.0)
Hemoglobin: 8.6 g/dL — ABNORMAL LOW (ref 13.0–17.0)

## 2018-12-14 LAB — GLUCOSE, CAPILLARY
Glucose-Capillary: 91 mg/dL (ref 70–99)
Glucose-Capillary: 94 mg/dL (ref 70–99)

## 2018-12-14 LAB — BASIC METABOLIC PANEL
Anion gap: 5 (ref 5–15)
BUN: 18 mg/dL (ref 8–23)
CO2: 29 mmol/L (ref 22–32)
Calcium: 7.9 mg/dL — ABNORMAL LOW (ref 8.9–10.3)
Chloride: 109 mmol/L (ref 98–111)
Creatinine, Ser: 0.93 mg/dL (ref 0.61–1.24)
GFR calc Af Amer: >60 mL/min (ref >60)
GFR calc non Af Amer: >60 mL/min (ref >60)
Glucose, Bld: 102 mg/dL — ABNORMAL HIGH (ref 70–99)
Potassium: 3.4 mmol/L — ABNORMAL LOW (ref 3.5–5.1)
Sodium: 143 mmol/L (ref 135–145)

## 2018-12-14 LAB — URINE CULTURE
Culture: NO GROWTH
Special Requests: NORMAL

## 2018-12-14 LAB — HEMOGLOBIN A1C
Hgb A1c MFr Bld: 5 % (ref 4.8–5.6)
Mean Plasma Glucose: 96.8 mg/dL

## 2018-12-14 LAB — HIV ANTIBODY (ROUTINE TESTING W REFLEX): HIV Screen 4th Generation wRfx: NONREACTIVE

## 2018-12-14 LAB — PROTIME-INR
INR: 1.1 (ref 0.8–1.2)
Prothrombin Time: 14.4 seconds (ref 11.4–15.2)

## 2018-12-14 LAB — APTT: aPTT: 28 seconds (ref 24–36)

## 2018-12-14 SURGERY — ESOPHAGOGASTRODUODENOSCOPY (EGD) WITH PROPOFOL
Anesthesia: Monitor Anesthesia Care

## 2018-12-14 MED ORDER — BUTAMBEN-TETRACAINE-BENZOCAINE 2-2-14 % EX AERO
INHALATION_SPRAY | CUTANEOUS | Status: DC | PRN
  Administered 2018-12-14: 15:00:00 2 via TOPICAL

## 2018-12-14 MED ORDER — INSULIN ASPART 100 UNIT/ML ~~LOC~~ SOLN
0.0000 [IU] | Freq: Three times a day (TID) | SUBCUTANEOUS | Status: DC
  Administered 2018-12-15: 1 [IU] via SUBCUTANEOUS

## 2018-12-14 MED ORDER — FENTANYL CITRATE (PF) 100 MCG/2ML IJ SOLN
INTRAMUSCULAR | Status: AC
  Filled 2018-12-14: qty 2

## 2018-12-14 MED ORDER — SODIUM CHLORIDE 0.9 % IV SOLN: INTRAVENOUS | Status: DC

## 2018-12-14 MED ORDER — POTASSIUM CHLORIDE CRYS ER 20 MEQ PO TBCR
40.0000 meq | EXTENDED_RELEASE_TABLET | Freq: Once | ORAL | Status: AC
  Administered 2018-12-14: 40 meq via ORAL
  Filled 2018-12-14: qty 2

## 2018-12-14 MED ORDER — POTASSIUM CHLORIDE 10 MEQ/100ML IV SOLN: 10.0000 meq | INTRAVENOUS | Status: DC

## 2018-12-14 MED ORDER — FENTANYL CITRATE (PF) 100 MCG/2ML IJ SOLN
INTRAMUSCULAR | Status: DC | PRN
  Administered 2018-12-14 (×3): 25 ug via INTRAVENOUS

## 2018-12-14 MED ORDER — MIDAZOLAM HCL (PF) 5 MG/ML IJ SOLN
INTRAMUSCULAR | Status: AC
  Filled 2018-12-14: qty 2

## 2018-12-14 MED ORDER — MIDAZOLAM HCL 2 MG/2ML IJ SOLN
INTRAMUSCULAR | Status: DC | PRN
  Administered 2018-12-14 (×2): 2 mg via INTRAVENOUS
  Administered 2018-12-14: 1 mg via INTRAVENOUS

## 2018-12-14 MED ORDER — LACTATED RINGERS IV SOLN
INTRAVENOUS | Status: DC
  Administered 2018-12-14: 14:00:00 1000 mL via INTRAVENOUS

## 2018-12-14 MED ORDER — POTASSIUM CHLORIDE 10 MEQ/100ML IV SOLN
10.0000 meq | INTRAVENOUS | Status: AC
  Administered 2018-12-14 (×2): 10 meq via INTRAVENOUS
  Filled 2018-12-14 (×2): qty 100

## 2018-12-14 MED ORDER — MIRTAZAPINE 15 MG PO TABS
15.0000 mg | ORAL_TABLET | Freq: Every day | ORAL | Status: DC
  Administered 2018-12-14 – 2018-12-21 (×8): 15 mg via ORAL
  Filled 2018-12-14 (×8): qty 1

## 2018-12-14 SURGICAL SUPPLY — 15 items

## 2018-12-14 NOTE — Progress Notes (Signed)
Patient has had an appropriate posttransfusion rise in hemoglobin which has remained stable for the past 12 hours around 8.5.  No further bowel movements.  He is feeling well, much better than yesterday.  Impression: Colitis and GI bleeding, probably of diverticular origin.  Plan: I spoke with the patient's wife my face time.  I have recommended endoscopic evaluation to take ulcer disease out of the equation (doubt), and the patient and his wife are agreeable after discussion of the nature, purpose, and risks.  His procedure is scheduled for later today.  They are aware of a relatively high risk of recurrent bleeding after a period of quiescence, if this is diverticular bleeding.  Cleotis Nipper, M.D. Pager [phone] If no answer or after 5 PM call [phone]

## 2018-12-14 NOTE — Progress Notes (Signed)
Large amt ob bloody stool(in toilet) at 2345 vital signs stable, complaining of syncope after incident.. Fall precautions initiated stated last bloody bowel movt was 12/13/18 in am  On call notified

## 2018-12-14 NOTE — Op Note (Signed)
[facility] Patient Name: James ShihWilliam Sak Procedure Date: 12/14/2018 MRN: [id number] Attending MD: Bernette Redbirdobert Quynh Basso , MD Date of Birth: 02/02/1956 CSN: [id number] Age: [age] Admit Type: Inpatient Procedure:                Upper GI endoscopy Indications:              Hematochezia; on ASA, but BUN nl and pt has known                            diverticulosis. R/O upper tract source. Providers:                Bernette Redbirdobert Jaramie Bastos, MD, Roselie AwkwardShannon Love, RN, Roslynn Ambleori                            Philipps, RN, Brion AlimentShayla Proctor, Technician Referring MD:              Medicines:                Midazolam 5 mg IV, Fentanyl 75 micrograms IV Complications:            No immediate complications. Estimated Blood Loss:     Estimated blood loss: none. Procedure:                After obtaining informed consent, the endoscope was                            passed under direct vision. Throughout the                            procedure, the patient's blood pressure, pulse, and                            oxygen saturations were monitored continuously. The                            GIF-H190 ([id number] Olympus gastroscope was                            introduced through the mouth, and advanced to the                            second part of duodenum. The upper GI endoscopy was                            accomplished without difficulty. The patient                            tolerated the procedure well. Scope In: Scope Out: Findings:      The larynx was normal.      The examined esophagus was normal.      A 2 cm hiatal hernia was present.      The entire examined stomach was normal.      There is no endoscopic evidence of erythema or inflammatory changes       suggestive of significant gastritis, ulceration or erosion in the entire       examined stomach.  The cardia and gastric fundus were normal on retroflexion.      The examined duodenum was normal. Impression:               - No active bleeding at  time of procedure.                           - No prospective bleeding source seen in the upper                            tract.                           - Normal larynx.                           - Normal esophagus.                           - 2 cm hiatal hernia.                           - Normal stomach.                           - Normal examined duodenum.                           - No specimens collected. Moderate Sedation:      Moderate (conscious) sedation was administered by the endoscopy nurse       and supervised by the endoscopist. The patient's oxygen saturation,       heart rate, blood pressure and response to care were monitored. Total       physician intraservice time was 10 minutes. Recommendation:           - Resume regular diet today.                           - Stop PPI. Procedure Code(s):        --- Professional ---                           [phone], Esophagogastroduodenoscopy, flexible,                            transoral; diagnostic, including collection of                            specimen(s) by brushing or washing, when performed                            (separate procedure) Diagnosis Code(s):        --- Professional ---                           K92.1, Melena (includes Hematochezia) CPT copyright 2019 [employer]. All rights reserved. The codes documented in this report are preliminary and upon coder review may  be revised to meet current compliance requirements. Bernette Redbird, MD  12/14/2018 3:38:19 PM This report has been signed electronically. Number of Addenda: 0

## 2018-12-14 NOTE — Evaluation (Signed)
Occupational Therapy Evaluation Patient Details Name: James Gibbs MRN: [id number] DOB: [birth year] Today's Date: 12/14/2018    History of Present Illness James Gibbs is a 63 y.o. male with PMH of MS, HTN, hyperlipidemia, mitral valve prolapse, DM II,  presenting with questionable syncope presyncopal episode, noting blood in stool.   Clinical Impression   Pt admitted with the above diagnoses and presents with below problem list. Pt will benefit from continued acute OT to address the below listed deficits and maximize independence with basic ADLs prior to d/c home. At baseline pt is mod I to independent with ADLs. He endorses using furniture as external support while mobilizing at home. Pt is currently set up to min guard with ADLs. Tolerated session well.      Follow Up Recommendations  Home health OT;Supervision - Intermittent    Equipment Recommendations  None recommended by OT    Recommendations for Other Services PT consult     Precautions / Restrictions Precautions Precautions: Fall Restrictions Weight Bearing Restrictions: No      Mobility Bed Mobility Overal bed mobility: Modified Independent                Transfers Overall transfer level: Modified independent Equipment used: None Transfers: Sit to/from Stand Sit to Stand: Modified independent (Device/Increase time)         General transfer comment: from EOB to recliner. supervision for safety    Balance Overall balance assessment: Needs assistance         Standing balance support: No upper extremity supported;Single extremity supported Standing balance-Leahy Scale: Fair Standing balance comment: single extremity support for dynamic tasks, uses furniture as external support to walk in the room                           ADL either performed or assessed with clinical judgement   ADL Overall ADL's : Needs assistance/impaired Eating/Feeding: Set up;Sitting   Grooming: Set  up;Sitting;Standing;Min guard   Upper Body Bathing: Set up;Sitting   Lower Body Bathing: Min guard;Sit to/from stand   Upper Body Dressing : Set up;Sitting   Lower Body Dressing: Min guard;Sit to/from stand   Toilet Transfer: Min guard;Ambulation   Toileting- Clothing Manipulation and Hygiene: Min guard;Sit to/from stand   Tub/ Shower Transfer: Min guard;Ambulation   Functional mobility during ADLs: Min guard(furniture walks) General ADL Comments: Pt completed functional mobility to and from bathroom, navigating obstacle and turns. Voided standing at toilet. Tolerated session well.     Vision         Perception     Praxis      Pertinent Vitals/Pain Pain Assessment: No/denies pain     Hand Dominance     Extremity/Trunk Assessment Upper Extremity Assessment Upper Extremity Assessment: Defer to OT evaluation   Lower Extremity Assessment Lower Extremity Assessment: Generalized weakness(R leg weaker than L)   Cervical / Trunk Assessment Cervical / Trunk Assessment: Kyphotic   Communication Communication Communication: No difficulties   Cognition Arousal/Alertness: Awake/alert Behavior During Therapy: WFL for tasks assessed/performed Overall Cognitive Status: Within Functional Limits for tasks assessed                                     General Comments       Exercises     Shoulder Instructions      Home Living Family/patient expects to be discharged to::  Private residence Living Arrangements: Alone Available Help at Discharge: Family;Available PRN/intermittently Type of Home: House Home Access: Stairs to enter [employer] of Steps: 3 Entrance Stairs-Rails: Right;Left Home Layout: One level     Bathroom Shower/Tub: Walk-in shower         Home Equipment: Gilmer Mor - single point;Shower seat          Prior Functioning/Environment Level of Independence: Independent        Comments: reports more recently staggering  backwards, but denies falls        OT Problem List: Decreased activity tolerance;Impaired balance (sitting and/or standing);Decreased knowledge of use of DME or AE;Decreased knowledge of precautions      OT Treatment/Interventions: Self-care/ADL training;Therapeutic exercise;Energy conservation;DME and/or AE instruction;Therapeutic activities;Patient/family education;Balance training    OT Goals(Current goals can be found in the care plan section) Acute Rehab OT Goals Patient Stated Goal: home, keep independence OT Goal Formulation: With patient Time For Goal Achievement: 12/28/18 Potential to Achieve Goals: Good ADL Goals Pt Will Perform Grooming: with modified independence;standing Pt Will Perform Lower Body Bathing: with modified independence;sit to/from stand Pt Will Perform Lower Body Dressing: with modified independence;sit to/from stand Pt Will Perform Tub/Shower Transfer: Shower transfer;with modified independence;ambulating  OT Frequency: Min 2X/week   Barriers to D/C:    from home alone       Co-evaluation              AM-PAC OT "6 Clicks" Daily Activity     Outcome Measure Help from another person eating meals?: None Help from another person taking care of personal grooming?: None Help from another person toileting, which includes using toliet, bedpan, or urinal?: None Help from another person bathing (including washing, rinsing, drying)?: A Little Help from another person to put on and taking off regular upper body clothing?: None Help from another person to put on and taking off regular lower body clothing?: None 6 Click Score: 23   End of Session    Activity Tolerance: Patient tolerated treatment well Patient left: in chair;with call bell/phone within reach  OT Visit Diagnosis: Unsteadiness on feet (R26.81);Muscle weakness (generalized) (M62.81)                Time: 1423-9532 OT Time Calculation (min): 20 min Charges:  OT General Charges $OT Visit:  1 Visit OT Evaluation $OT Eval Low Complexity: 1 Low  Raynald Kemp, OT Acute Rehabilitation Services Pager: [phone] Office: [phone]   Pilar Grammes 12/14/2018, 1:17 PM

## 2018-12-14 NOTE — Interval H&P Note (Signed)
History and Physical Interval Note:  12/14/2018 1:33 PM  James Gibbs  has presented today for surgery, with the diagnosis of hematochezia.  The various methods of treatment have been discussed with the patient and family. After consideration of risks, benefits and other options for treatment, the patient has consented to  Procedure(s): ESOPHAGOGASTRODUODENOSCOPY (EGD) WITH PROPOFOL (N/A) as a surgical intervention.  The patient's history has been reviewed, patient examined, no change in status, stable for surgery.  I have reviewed the patient's chart and labs.  Questions were answered to the patient's satisfaction.     [name]

## 2018-12-14 NOTE — Anesthesia Preprocedure Evaluation (Deleted)
Anesthesia Evaluation  Patient identified by MRN, date of birth, ID band Patient awake    Reviewed: Allergy & Precautions, NPO status , Patient's Chart, lab work & pertinent test results  Airway Mallampati: II  TM Distance: >3 FB Neck ROM: Full    Dental no notable dental hx.    Pulmonary neg pulmonary ROS,    Pulmonary exam normal breath sounds clear to auscultation       Cardiovascular hypertension, Pt. on medications negative cardio ROS Normal cardiovascular exam Rhythm:Regular Rate:Normal     Neuro/Psych negative neurological ROS  negative psych ROS   GI/Hepatic negative GI ROS, Neg liver ROS,   Endo/Other  diabetes, Type 2, Oral Hypoglycemic Agents  Renal/GU negative Renal ROS  negative genitourinary   Musculoskeletal negative musculoskeletal ROS (+)   Abdominal   Peds negative pediatric ROS (+)  Hematology negative hematology ROS (+) anemia ,   Anesthesia Other Findings   Reproductive/Obstetrics negative OB ROS                             Anesthesia Physical Anesthesia Plan  ASA: III  Anesthesia Plan: MAC   Post-op Pain Management:    Induction: Intravenous  PONV Risk Score and Plan: 1 and Ondansetron and Treatment may vary due to age or medical condition  Airway Management Planned: Nasal Cannula  Additional Equipment:   Intra-op Plan:   Post-operative Plan:   Informed Consent: I have reviewed the patients History and Physical, chart, labs and discussed the procedure including the risks, benefits and alternatives for the proposed anesthesia with the patient or authorized representative who has indicated his/her understanding and acceptance.     Dental advisory given  Plan Discussed with: CRNA  Anesthesia Plan Comments:         Anesthesia Quick Evaluation

## 2018-12-14 NOTE — Progress Notes (Addendum)
Patient's upper endoscopy was well-tolerated, and was normal apart from a small hiatal hernia, specifically without any source of GI bleeding or aspirin gastropathy.  I will advance the patient's diet and stop his pantoprazole, also decrease the frequency of blood drawing in view of the fact that he is not having any further bleeding as of this time.  Discharge tomorrow might be appropriate if he remains clinically stable.  Considering that this patient lost approximately half of the blood in his body before he was admitted, and it has been just 24 hours since he stopped bleeding, and since the most likely cause for his bleeding (diverticular hemorrhage) has a frequent tendency to recur after a day or so of quiescence, in my opinion, even though the patient appears completely stable at the present moment, it is prudent to observe him at least overnight before discharge.  Cleotis Nipper, M.D. Pager [phone] If no answer or after 5 PM call [phone]

## 2018-12-14 NOTE — Progress Notes (Signed)
PROGRESS NOTE    James Gibbs  ZOX:[id number] DOB: December 31, 1955 DOA: 12/13/2018 PCP: [name], [name], MD   Brief Narrative: HPI per Dr. Nevin BloodgoodSeyed Shahmehdi on 12/13/2018  James Gibbs is a 63 y.o. male with medical history significant of 63 year old male with past medical history of hypertension, hyperlipidemia, mitral valve prolapse,DM II,  presenting with questionable syncope presyncopal episode, noting blood in stool.  Patient Denies having: Fever, Chills, Cough, SOB, Chest Pain, Abd pain, N/V/D, headache, dizziness, lightheadedness, joint pain, rash, open wounds  ED Course:   Mildly hypotensive, H&H down from baseline 2 units of packed red blood cell has been ordered pending transfusion GI Dr. Matthias HughsBuccini has been consulted pending evaluation recommendations.  Patient to be admitted for GI evaluation, anemia due to GI bleed blood transfusion.  **Interim History  Patient felt better after blood transfusion and went for EGD today which was normal.  GI recommending observing another night and resuming regular diet and watching for diverticular bleed closely.  Assessment & Plan:   Principal Problem:   GIB (gastrointestinal bleeding) Active Problems:   MITRAL REGURGITATION   HTN (hypertension)   Syncope, vasovagal   Hypotension   Gout  Acute GIB (gastrointestinal bleeding) and suspect Diverticular Bleed Macrocytic Anemia  -Patient will be admitted to Telemetry  -Continued Gentle IV Fluid Hydration with NS at 100 mL/hr and stopped today -Since patient is symptomatic with mild hypotension,, tachycardia, syncope question of presyncope episode  -Patient will be transfused with packed red blood cells -2 units of packed red blood cell has been ordered in ED pending transfusion and this was Done -Hgb/Hct went from 7.6/23.2 -> 8.6/26.6 now -GI Dr. Matthias HughsBuccini has been consulted pending evaluation recommendations. And recommended EGD which was normal -Continued Protonix drip but now  stopped per GI Recommendation -Mia panel done showed an iron level of 53, U IBC 164, TIBC of 217, saturation ratio of 24%, ferritin level 20, folate level 23.1, and vitamin B12 level of 435 -Holding home medication of aspirin for now and resume when ok with GI  -Diet Advanced per GI Recommendation to Regular Diet -Will to continue to monitor carefully and observe overnight given GIs recommendation to ensure stability for risk of recurrent GI bleeding; patient has lost approximately-blood prior to being admitted we need to make sure he does not lose further blood and watch overnight and repeat a CBC in a.m.  Presyncope/Syncopal episode -Vasovagal, due to above, GI bleed -Continue to monitor, neurochecks -Treatment as above -If any further episode despite above treatment, we will consider considering CT of the head, carotid study and ECHO -PT recommending Outpatient PT and OT recommending Home Health OT -Will check Orthostatic Vital Signs Prior to D/C   MITRAL REGURGITATION -Currently stable, -Monitoring closely -Gentle IV fluid hydration now stopped   Mild hypotension likely due to volume depletion, GI bleed /history of hypertension with LVH -Continued gentle IV fluid hydration, -Was holding blood pressure medication including Norvasc, HCTZ, Lisinopril -Amlodipine 10 mg po Daily resumed -Consider resuming Lisinopril and HCTZ at D/C  Hyperlipidemia -Continue Atorvastatin 80 mg po q Daily   Diabetes Mellitus Type II -Withholding home medication of metformin -Checking blood sugar before while n.p.o. then QA CHS, SSI  Hypokalemia -Patient's K+ was 3.4 this AM -Replete with IV KCl and then stopped and changed to po KCl 40 mEQ once able to tolerate Diet  -Continue to Monitor and Replete as Necessary -Repeat CMP in AM   Gout -C/w Allopurinol 300 mg po Daily  DVT prophylaxis: SCDs Code Status: FULL CODE  Family Communication: No family present at bedside  Disposition Plan:  Home with Home Health OT and Outpatient PT and anticipate D/C Home if medically stable   Consultants:   Gastroenterology Dr. Matthias Hughs   Procedures: EGD Findings:      The larynx was normal.      The examined esophagus was normal.      A 2 cm hiatal hernia was present.      The entire examined stomach was normal.      There is no endoscopic evidence of erythema or inflammatory changes       suggestive of significant gastritis, ulceration or erosion in the entire       examined stomach.      The cardia and gastric fundus were normal on retroflexion.      The examined duodenum was normal. Impression:               - No active bleeding at time of procedure.                           - No prospective bleeding source seen in the upper                            tract.                           - Normal larynx.                           - Normal esophagus.                           - 2 cm hiatal hernia.                           - Normal stomach.                           - Normal examined duodenum.                           - No specimens collected.  Antimicrobials:  Anti-infectives (From admission, onward)   None     Subjective: Seen and examined at bedside and states he is doing okay but complaining that his arm is burning and hurting in the setting of where his IV was running.  No chest pain, lightheadedness or dizziness.  Has not had any further bloody bowel movements and dark tarry stools.  No other concerns or plans at this time.  Objective: Vitals:   12/14/18 1515 12/14/18 1521 12/14/18 1530 12/14/18 1540  BP: 133/82 134/66 136/74 132/77  Pulse: 90 81 78 80  Resp: 20 (!) Temp:  (!) 96.7 F (35.9 C)    TempSrc:  Temporal    SpO2: 100% 100% 98% 98%  Weight:      Height:        Intake/Output Summary (Last 24 hours) at 12/14/2018 1546 Last data filed at 12/14/2018 1109 Gross per 24 hour  Intake 1429.08 ml  Output 1550 ml  Net -120.92 ml   Filed Weights    12/14/18 1329  Weight: 95.7  kg   Examination: Physical Exam:  Constitutional: WN/WD overweight AAM in NAD and appears calm and comfortable Eyes: Lids and conjunctivae normal, sclerae anicteric  ENMT: External Ears, Nose appear normal. Grossly normal hearing.  Neck: Appears normal, supple, no cervical masses, normal ROM, no appreciable thyromegaly; no JVD Respiratory: Clear to auscultation bilaterally, no wheezing, rales, rhonchi or crackles. Normal respiratory effort and patient is not tachypenic. No accessory muscle use.  Cardiovascular: RRR, no murmurs / rubs / gallops. S1 and S2 auscultated.  Abdomen: Soft, mildly tender; Slightly tender No masses palpated. No appreciable hepatosplenomegaly. Bowel sounds positive x4 and hyperactive.  GU: Deferred. Musculoskeletal: No clubbing / cyanosis of digits/nails. No joint deformity upper and lower extremities.  Skin: No rashes, lesions, ulcers on a limited skin evaluation. No induration; Warm and dry.  Neurologic: CN 2-12 grossly intact with no focal deficits. Romberg sign and cerebellar reflexes not assessed.  Psychiatric: Normal judgment and insight. Alert and oriented x 3. Normal mood and appropriate affect.   Data Reviewed: I have personally reviewed following labs and imaging studies  CBC: Recent Labs  Lab 12/13/18 1131 12/13/18 2130 12/14/18 0141 12/14/18 0741 12/14/18 1315  WBC 9.2  --  7.2  --   --   NEUTROABS 6.6  --   --   --   --   HGB 7.6* 8.5* 8.3* 8.5* 8.6*  HCT 23.2* 25.9* 26.1* 25.8* 26.6*  MCV 101.8*  --  99.2  --   --   PLT 192  --  140*  --   --    Basic Metabolic Panel: Recent Labs  Lab 12/13/18 1131 12/13/18 2130 12/14/18 0741  NA 140  --  143  K 3.4*  --  3.4*  CL 105  --  109  CO2 25  --  29  GLUCOSE 189*  --  102*  BUN 23  --  18  CREATININE 1.11  --  0.93  CALCIUM 8.0* 7.8* 7.9*  MG  --  2.0  --   PHOS  --  3.2  --    GFR: Estimated Creatinine Clearance: 94.8 mL/min (by C-G formula based on SCr  of 0.93 mg/dL). Liver Function Tests: Recent Labs  Lab 12/13/18 1131  AST 25  ALT 22  ALKPHOS 47  BILITOT 0.3  PROT 5.2*  ALBUMIN 3.0*   Recent Labs  Lab 12/13/18 1131  LIPASE 25   No results for input(s): AMMONIA in the last 168 hours. Coagulation Profile: Recent Labs  Lab 12/13/18 1131 12/14/18 0141  INR 1.1 1.1   Cardiac Enzymes: No results for input(s): CKTOTAL, CKMB, CKMBINDEX, TROPONINI in the last 168 hours. BNP (last 3 results) No results for input(s): PROBNP in the last 8760 hours. HbA1C: Recent Labs    12/13/18 2130  HGBA1C 5.0   CBG: Recent Labs  Lab 12/14/18 0727  GLUCAP 91   Lipid Profile: No results for input(s): CHOL, HDL, LDLCALC, TRIG, CHOLHDL, LDLDIRECT in the last 72 hours. Thyroid Function Tests: Recent Labs    12/13/18 2130  TSH 2.000   Anemia Panel: Recent Labs    12/13/18 1131  VITAMINB12 434  FOLATE 23.1  FERRITIN 20*  TIBC 217*  IRON 53  RETICCTPCT 5.0*   Sepsis Labs: No results for input(s): PROCALCITON, LATICACIDVEN in the last 168 hours.  Recent Results (from the past 240 hour(s))  Urine culture     Status: None   Collection Time: 12/13/18 11:43 AM   Specimen: Urine, Clean Catch  Result Value  Ref Range Status   Specimen Description   Final    URINE, CLEAN CATCH Performed at [facility], [place] [address] [place], [place] [address]    Special Requests   Final    Normal Performed at [facility], [place] [address] [place], [place] [address]    Culture   Final    NO GROWTH Performed at [facility], [place] [address] [place], [place] [address]    Report Status 12/14/2018 FINAL  Final  SARS Coronavirus 2 (CEPHEID - Performed in [place] hospital lab), Hosp Order     Status: None   Collection Time: 12/13/18 12:39 PM   Specimen: Nasopharyngeal Swab  Result Value Ref Range Status   SARS Coronavirus 2 NEGATIVE NEGATIVE Final    Comment: (NOTE) If result is  NEGATIVE SARS-CoV-2 target nucleic acids are NOT DETECTED. The SARS-CoV-2 RNA is generally detectable in upper and lower  respiratory specimens during the acute phase of infection. The lowest  concentration of SARS-CoV-2 viral copies this assay can detect is 250  copies / mL. A negative result does not preclude SARS-CoV-2 infection  and should not be used as the sole basis for treatment or other  patient management decisions.  A negative result may occur with  improper specimen collection / handling, submission of specimen other  than nasopharyngeal swab, presence of viral mutation(s) within the  areas targeted by this assay, and inadequate number of viral copies  (<250 copies / mL). A negative result must be combined with clinical  observations, patient history, and epidemiological information. If result is POSITIVE SARS-CoV-2 target nucleic acids are DETECTED. The SARS-CoV-2 RNA is generally detectable in upper and lower  respiratory specimens dur ing the acute phase of infection.  Positive  results are indicative of active infection with SARS-CoV-2.  Clinical  correlation with patient history and other diagnostic information is  necessary to determine patient infection status.  Positive results do  not rule out bacterial infection or co-infection with other viruses. If result is PRESUMPTIVE POSTIVE SARS-CoV-2 nucleic acids MAY BE PRESENT.   A presumptive positive result was obtained on the submitted specimen  and confirmed on repeat testing.  While 2019 novel coronavirus  (SARS-CoV-2) nucleic acids may be present in the submitted sample  additional confirmatory testing may be necessary for epidemiological  and / or clinical management purposes  to differentiate between  SARS-CoV-2 and other Sarbecovirus currently known to infect humans.  If clinically indicated additional testing with an alternate test  methodology [phone]) is advised. The SARS-CoV-2 RNA is generally  detectable  in upper and lower respiratory sp ecimens during the acute  phase of infection. The expected result is Negative. Fact Sheet for Patients:  StrictlyIdeas.no Fact Sheet for Healthcare Providers: BankingDealers.co.za This test is not yet approved or cleared by the [place] FDA and has been authorized for detection and/or diagnosis of SARS-CoV-2 by FDA under an Emergency Use Authorization (EUA).  This EUA will remain in effect (meaning this test can be used) for the duration of the COVID-19 declaration under Section 564(b)(1) of the Act, 21 U.S.C. section 360bbb-3(b)(1), unless the authorization is terminated or revoked sooner. Performed at [facility], [place] [address] [place], [place] [address]     Radiology Studies: Dg Chest Portable 1 View  Result Date: 12/13/2018 CLINICAL DATA:  Syncopal episode today. EXAM: PORTABLE CHEST 1 VIEW COMPARISON:  05/09/2015. FINDINGS: Normal sized heart. Tortuous aorta. Clear lungs with normal vascularity. Thoracic  spine degenerative changes. No fracture or pneumothorax seen. IMPRESSION: No acute abnormality. Electronically Signed   By: Beckie SaltsSteven  Reid M.D.   On: 12/13/2018 11:57   Scheduled Meds:  [MAR Hold] allopurinol  300 mg Oral Daily   [MAR Hold] amLODipine  10 mg Oral Daily   [MAR Hold] atorvastatin  80 mg Oral Daily   [MAR Hold] docusate sodium  100 mg Oral BID   [MAR Hold] pantoprazole  40 mg Intravenous Q12H   [MAR Hold] sodium chloride flush  3 mL Intravenous Q12H   Continuous Infusions:  [MAR Hold] sodium chloride     sodium chloride     lactated ringers Stopped (12/14/18 1540)   [MAR Hold] pantoprazole (PROTONIX) IV     pantoprozole (PROTONIX) infusion      LOS: 1 day   Merlene Laughtermair Latif Elihu Milstein, DO [facility] PAGER is on AMION  If 7PM-7AM, please contact night-coverage [email or web address] Password [email or web address] 12/14/2018, 3:46 PM

## 2018-12-14 NOTE — Evaluation (Signed)
Physical Therapy Evaluation Patient Details Name: James Gibbs MRN: [id number] DOB: 05-30-1955 Today's Date: 12/14/2018   History of Present Illness  James Gibbs is a 63 y.o. male with medical history significant of 63 year old male with past medical history of hypertension, hyperlipidemia, mitral valve prolapse,DM II, MS  presenting with questionable syncope presyncopal episode, noting blood in stool  Clinical Impression  Patient presents with decreased balance and coordination and pt relates recent staggering backwards (but denies falls).  Currently S to occasional minguard for ambulation with cane in hallway with sloe pace and flexed/crouched posture.  Feel patient should be safe for d/c home with intermittent family support and follow up outpatient PT at [facility]habilitation center.  PT to follow acutely.    Follow Up Recommendations Outpatient PT    Equipment Recommendations  None recommended by PT    Recommendations for Other Services       Precautions / Restrictions Precautions Precautions: Fall Restrictions Weight Bearing Restrictions: No      Mobility  Bed Mobility Overal bed mobility: Modified Independent                Transfers Overall transfer level: Modified independent Equipment used: None Transfers: Sit to/from Stand Sit to Stand: Modified independent (Device/Increase time)         General transfer comment: from EOB to recliner. supervision for safety  Ambulation/Gait Ambulation/Gait assistance: Supervision;Min guard Gait Distance (Feet): 250 Feet Assistive device: Straight cane Gait Pattern/deviations: Step-to pattern;Step-through pattern;Decreased stride length;Shuffle;Trunk flexed     General Gait Details: slow pace with cane able to sequence well, crouch gait with hip/knee flexion and did demonstrate some posterior staggering at times with turning, but able to recover essentially unaided  Stairs Stairs: Yes Stairs assistance:  Min guard;Supervision Stair Management: One rail Right;Step to pattern;Forwards;With cane Number of Stairs: 3    Wheelchair Mobility    Modified Rankin (Stroke Patients Only)       Balance Overall balance assessment: Needs assistance   Sitting balance-Leahy Scale: Good     Standing balance support: No upper extremity supported;Single extremity supported Standing balance-Leahy Scale: Good Standing balance comment: can stand eyes closed 10 sec and feet together x 30 sec                             Pertinent Vitals/Pain Pain Assessment: No/denies pain    Home Living Family/patient expects to be discharged to:: Private residence Living Arrangements: Alone Available Help at Discharge: Family;Available PRN/intermittently Type of Home: House Home Access: Stairs to enter Entrance Stairs-Rails: Doctor, general practice of Steps: 3 Home Layout: One level Home Equipment: Cane - single point;Shower seat      Prior Function Level of Independence: Independent         Comments: reports more recently staggering backwards, but denies falls     Hand Dominance        Extremity/Trunk Assessment   Upper Extremity Assessment Upper Extremity Assessment: Defer to OT evaluation    Lower Extremity Assessment Lower Extremity Assessment: Generalized weakness(R leg weaker than L)    Cervical / Trunk Assessment Cervical / Trunk Assessment: Kyphotic  Communication   Communication: No difficulties  Cognition Arousal/Alertness: Awake/alert Behavior During Therapy: WFL for tasks assessed/performed Overall Cognitive Status: Within Functional Limits for tasks assessed  General Comments General comments (skin integrity, edema, etc.): Discussed using cane in community, pt reports rides stationary bike and hour in the morning and an hour in the evening    Exercises     Assessment/Plan    PT Assessment  Patient needs continued PT services  PT Problem List Decreased balance;Decreased coordination;Decreased mobility;Decreased strength;Decreased knowledge of use of DME       PT Treatment Interventions DME instruction;Stair training;Gait training;Functional mobility training;Therapeutic exercise;Therapeutic activities;Balance training;Patient/family education    PT Goals (Current goals can be found in the Care Plan section)  Acute Rehab PT Goals Patient Stated Goal: home, keep independence PT Goal Formulation: With patient Time For Goal Achievement: 12/21/18 Potential to Achieve Goals: Good    Frequency Min 3X/week   Barriers to discharge Decreased caregiver support      Co-evaluation               AM-PAC PT "6 Clicks" Mobility  Outcome Measure Help needed turning from your back to your side while in a flat bed without using bedrails?: None Help needed moving from lying on your back to sitting on the side of a flat bed without using bedrails?: None Help needed moving to and from a bed to a chair (including a wheelchair)?: None Help needed standing up from a chair using your arms (e.g., wheelchair or bedside chair)?: None Help needed to walk in hospital room?: A Little Help needed climbing 3-5 steps with a railing? : A Little 6 Click Score: 22    End of Session Equipment Utilized During Treatment: Gait belt Activity Tolerance: Patient tolerated treatment well Patient left: in bed   PT Visit Diagnosis: Other abnormalities of gait and mobility (R26.89)    Time: 1443-1540 PT Time Calculation (min) (ACUTE ONLY): 24 min   Charges:   PT Evaluation $PT Eval Moderate Complexity: 1 Mod PT Treatments $Gait Training: 8-22 mins        Magda Kiel, [place] [phone] 12/14/2018   Reginia Naas 12/14/2018, 1:23 PM

## 2018-12-15 ENCOUNTER — Inpatient Hospital Stay (HOSPITAL_COMMUNITY): Payer: Medicare HMO

## 2018-12-15 DIAGNOSIS — K922 Gastrointestinal hemorrhage, unspecified: Secondary | ICD-10-CM

## 2018-12-15 DIAGNOSIS — D62 Acute posthemorrhagic anemia: Secondary | ICD-10-CM

## 2018-12-15 LAB — PHOSPHORUS: Phosphorus: 2.5 mg/dL (ref 2.5–4.6)

## 2018-12-15 LAB — GLUCOSE, CAPILLARY
Glucose-Capillary: 117 mg/dL — ABNORMAL HIGH (ref 70–99)
Glucose-Capillary: 144 mg/dL — ABNORMAL HIGH (ref 70–99)

## 2018-12-15 LAB — CBC WITH DIFFERENTIAL/PLATELET
Abs Immature Granulocytes: 0.03 10*3/uL (ref 0.00–0.07)
Basophils Absolute: 0 10*3/uL (ref 0.0–0.1)
Basophils Relative: 0 %
Eosinophils Absolute: 0.1 10*3/uL (ref 0.0–0.5)
Eosinophils Relative: 1 %
HCT: 23.9 % — ABNORMAL LOW (ref 39.0–52.0)
Hemoglobin: 7.7 g/dL — ABNORMAL LOW (ref 13.0–17.0)
Immature Granulocytes: 0 %
Lymphocytes Relative: 15 %
Lymphs Abs: 1.5 10*3/uL (ref 0.7–4.0)
MCH: 32.9 pg (ref 26.0–34.0)
MCHC: 32.2 g/dL (ref 30.0–36.0)
MCV: 102.1 fL — ABNORMAL HIGH (ref 80.0–100.0)
Monocytes Absolute: 0.7 10*3/uL (ref 0.1–1.0)
Monocytes Relative: 6 %
Neutro Abs: 8 10*3/uL — ABNORMAL HIGH (ref 1.7–7.7)
Neutrophils Relative %: 78 %
Platelets: 142 10*3/uL — ABNORMAL LOW (ref 150–400)
RBC: 2.34 MIL/uL — ABNORMAL LOW (ref 4.22–5.81)
RDW: 18.2 % — ABNORMAL HIGH (ref 11.5–15.5)
WBC: 10.3 10*3/uL (ref 4.0–10.5)
nRBC: 0.2 % (ref 0.0–0.2)

## 2018-12-15 LAB — COMPREHENSIVE METABOLIC PANEL
ALT: 28 U/L (ref 0–44)
AST: 23 U/L (ref 15–41)
Albumin: 2.9 g/dL — ABNORMAL LOW (ref 3.5–5.0)
Alkaline Phosphatase: 40 U/L (ref 38–126)
Anion gap: 11 (ref 5–15)
BUN: 22 mg/dL (ref 8–23)
CO2: 21 mmol/L — ABNORMAL LOW (ref 22–32)
Calcium: 7.7 mg/dL — ABNORMAL LOW (ref 8.9–10.3)
Chloride: 110 mmol/L (ref 98–111)
Creatinine, Ser: 0.97 mg/dL (ref 0.61–1.24)
GFR calc Af Amer: >60 mL/min (ref >60)
GFR calc non Af Amer: >60 mL/min (ref >60)
Glucose, Bld: 137 mg/dL — ABNORMAL HIGH (ref 70–99)
Potassium: 3.5 mmol/L (ref 3.5–5.1)
Sodium: 142 mmol/L (ref 135–145)
Total Bilirubin: 0.6 mg/dL (ref 0.3–1.2)
Total Protein: 4.7 g/dL — ABNORMAL LOW (ref 6.5–8.1)

## 2018-12-15 LAB — HEMOGLOBIN AND HEMATOCRIT, BLOOD
HCT: 21.1 % — ABNORMAL LOW (ref 39.0–52.0)
HCT: 25.8 % — ABNORMAL LOW (ref 39.0–52.0)
Hemoglobin: 6.8 g/dL — CL (ref 13.0–17.0)
Hemoglobin: 8 g/dL — ABNORMAL LOW (ref 13.0–17.0)

## 2018-12-15 LAB — MAGNESIUM: Magnesium: 1.8 mg/dL (ref 1.7–2.4)

## 2018-12-15 LAB — PREPARE RBC (CROSSMATCH)

## 2018-12-15 LAB — MRSA PCR SCREENING: MRSA by PCR: NEGATIVE

## 2018-12-15 MED ORDER — SODIUM CHLORIDE 0.9 % IV BOLUS: 1000.0000 mL | Freq: Once | INTRAVENOUS | Status: DC

## 2018-12-15 MED ORDER — ACETAMINOPHEN 325 MG PO TABS
650.0000 mg | ORAL_TABLET | Freq: Once | ORAL | Status: AC
  Administered 2018-12-15: 650 mg via ORAL
  Filled 2018-12-15: qty 2

## 2018-12-15 MED ORDER — TECHNETIUM TC 99M-LABELED RED BLOOD CELLS IV KIT
22.9000 | PACK | Freq: Once | INTRAVENOUS | Status: AC | PRN
  Administered 2018-12-15: 16:00:00 22.9 via INTRAVENOUS

## 2018-12-15 MED ORDER — INSULIN ASPART 100 UNIT/ML ~~LOC~~ SOLN
0.0000 [IU] | SUBCUTANEOUS | Status: DC
  Administered 2018-12-16: 2 [IU] via SUBCUTANEOUS
  Administered 2018-12-16 (×3): 1 [IU] via SUBCUTANEOUS

## 2018-12-15 MED ORDER — MAGNESIUM SULFATE 2 GM/50ML IV SOLN
2.0000 g | Freq: Once | INTRAVENOUS | Status: AC
  Administered 2018-12-15: 2 g via INTRAVENOUS
  Filled 2018-12-15: qty 50

## 2018-12-15 MED ORDER — POTASSIUM CHLORIDE CRYS ER 20 MEQ PO TBCR
40.0000 meq | EXTENDED_RELEASE_TABLET | Freq: Once | ORAL | Status: AC
  Administered 2018-12-15: 40 meq via ORAL
  Filled 2018-12-15: qty 2

## 2018-12-15 MED ORDER — DIPHENHYDRAMINE HCL 25 MG PO CAPS
25.0000 mg | ORAL_CAPSULE | Freq: Once | ORAL | Status: AC
  Administered 2018-12-15: 12:00:00 25 mg via ORAL
  Filled 2018-12-15: qty 1

## 2018-12-15 MED ORDER — SODIUM CHLORIDE 0.9% IV SOLUTION: Freq: Once | INTRAVENOUS | Status: DC

## 2018-12-15 MED ORDER — FUROSEMIDE 10 MG/ML IJ SOLN
20.0000 mg | Freq: Once | INTRAMUSCULAR | Status: AC
  Administered 2018-12-15: 20 mg via INTRAVENOUS
  Filled 2018-12-15: qty 2

## 2018-12-15 MED ORDER — DEXTROSE-NACL 5-0.9 % IV SOLN
INTRAVENOUS | Status: DC
  Administered 2018-12-15 – 2018-12-16 (×2): via INTRAVENOUS

## 2018-12-15 NOTE — Progress Notes (Signed)
Dr. Irine Seal notified me that the patient has had further hematochezia, with syncope, and a further drop in hemoglobin.  The patient is being transferred to ICU for closer monitoring.  I have ordered a tagged red blood cell scan.  Cleotis Nipper, M.D. Pager [phone] If no answer or after 5 PM call [phone]

## 2018-12-15 NOTE — Progress Notes (Signed)
3 epis of hematochezia -- 2 around midnight (large vol, 1 w/ near-syncope) and 1 this morning.  Hgb dropped ffrom  8.3 to 7.7.  At present, pt resting comfortably, skin warm and dry, radial pulse full.  IMPR:  recurr divertic bld  RECOMM: continue in-house observation.  No need for bleeding scan unless brisk/destabilizing bleeding occurs.  Might consider colonoscopy if bleeding persists on and off more than a few days.  Cleotis Nipper, M.D. Pager [phone] If no answer or after 5 PM call [phone]

## 2018-12-15 NOTE — Significant Event (Signed)
Rapid Response Event Note Overview:  Notified by 5 Therapist, sports that patient was unresponsive. Upon arrival, patient is sitting on commode and unresponsive. RN staff and NT staff, and MD Grandville Silos holding patient up. Assisted staff to help place patient back into bed. Patient barely responded to pain at first. Patient placed on NRB. Will admit to ICU/SD.   Initial Focused Assessment:  Neuro: Patient unresponsive at first, but eventually responded to pain. Patient knew himself but was delayed. Disoriented to place, time, and situation. Pupils 2+, equal an reactive to light. Eventually patient was able to follow commands and move all extremities. Cardiac: NSR to ST, 90s-low 100s, BP slightly hypotensive. Patient had 1+ pulses in radial and pedal locations bilaterally. Pulmonary: No apparent respiratory distress, O2 Sats 98-100%, RR 12-20, place on NRB at first due to patient being unresponsive.     Interventions: 1 Liter bolus per MD Grandville Silos Transfer to [place] (if not transferred):  Event Summary:   at      at          [facility]

## 2018-12-15 NOTE — Progress Notes (Signed)
RN called to by NT to find pt on the toilet and minimally responsive. Large bloody BM noted in commode. Rapid response RN notified and MD and immediately at bedside. Pt transferred to [facility] unit.

## 2018-12-15 NOTE — Progress Notes (Signed)
CRITICAL VALUE ALERT  Critical Value:  Hgb 6.8  Date & Time Notied:  12/15/18 @ 1324  Provider Notified: Dr. Grandville Silos  Orders Received/Actions taken: Orders recieved

## 2018-12-15 NOTE — Progress Notes (Signed)
Pt assisted to the [facility] to have a BM. Intially pt didn't express feelings of dizziness, however after having a large liquid BM that consisted of blood and was dark red in nature, he began to feel dizzy and flush. Pt assisted back to bed and is now resting comfortable. Noted tachycardia in the 120s with activity is improving. Will continue to monitor.

## 2018-12-15 NOTE — Progress Notes (Signed)
PROGRESS NOTE    JERIMAH WITUCKI  GHW:[id number] DOB: 06-22-1955 DOA: 12/13/2018 PCP: Thurman Coyer, MD    Brief Narrative:  HPI per Dr. Skipper Cliche on 12/13/2018  [name] a 63 y.o.malewith medical history significant of36 year old male with past medical history of hypertension, hyperlipidemia, mitral valve prolapse,DM II,presenting with questionable syncope presyncopal episode,noting blood in stool.  Patient Denies having: Fever, Chills, Cough, SOB, Chest Pain, Abd pain, N/V/D, headache, dizziness, lightheadedness, joint pain, rash, open wounds  ED Course: Mildly hypotensive, H&H down from baseline 2 units of packed red blood cell has been ordered pending transfusion GI Dr.Buccinihas been consulted pending evaluation recommendations.  Patient to be admitted for GI evaluation, anemia due to GI bleed blood transfusion  Patient subsequently underwent upper endoscopy on 12/14/2018 with no source of bleeding.   Assessment & Plan:   Principal Problem:   GIB (gastrointestinal bleeding) Active Problems:   Syncope, vasovagal   MITRAL REGURGITATION   HTN (hypertension)   Hypotension   Gout   Acute blood loss anemia  1 vasovagal syncope Patient noted to have a vasovagal syncope this morning while on the toilet having a large bloody bowel movement.  Patient noted per RN to have had a similar episode last night.  Patient with ongoing bleeding.  Hemoglobin currently at 6.8.  2 units of packed red blood cells have been ordered to be transfused.  We will give a liter bolus normal saline.  Placed on IV fluids.  Transfer to the stepdown unit for closer observation and monitoring.  Discussed with GI.  Follow.  2.  Acute GI bleed suspect diverticular bleed/acute blood loss anemia ??  Etiology.  Patient admitted with questionable syncopal/presyncopal episode noted to be mildly hypotensive on admission with anemia.  Hemoglobin on admission was down to 7.6.  Patient  transfused 2 units packed red blood cells.  Hemoglobin noted to be 6.8 this morning.  Patient with ongoing bloody bowel movements.  Patient seen by GI underwent upper endoscopy with no source of bleeding noted.  Patient with vasovagal syncopal episodes likely attributed to acute GI bleed.  Patient to be transferred to the stepdown unit.  2 units of packed red blood cells ordered for transfusion.  GI updated on patient's ongoing bleeding and syncopal episode.  Patient for tagged red blood cell study which has been ordered per GI.  Follow H&H.  Transfusion threshold hemoglobin less than 7.  GI following and appreciate input and recommendations.  3.  Mitral regurgitation Stable.  Monitor closely for volume overload as patient with ongoing bleeding requiring transfusion of packed red blood cells.  4.  Mild hypotension Felt secondary to volume depletion in the setting of GI bleed.  Patient's antihypertensive medications of HCTZ and lisinopril on hold.  Patient on Norvasc 10 mg daily which we will discontinue for now.  Follow.  5.  Hyperlipidemia Continue statin.  6.  Type 2 diabetes mellitus Oral hypoglycemic agents on hold.  Continue SSI.  7.  Gout Stable.  Continue allopurinol.  8.  Hypokalemia K. Dur 40 mEq p.o. x1.  Potassium at 3.5.  Follow.   DVT prophylaxis: SCDs Code Status: Full Family Communication: No family at bedside. Disposition Plan: Transfer to ICU.   Consultants:   Gastroenterology: Dr. Cristina Gong 12/13/2018  Procedures:   Chest x-ray 12/13/2018  Tagged red blood cell scan pending 12/15/2018  Upper endoscopy 12/14/2018  2 units transfusion packed red blood cells 12/13/2018  2 units transfusion packed red blood cells 12/15/2018--pending  Antimicrobials:  None   Subjective: Was called by RN as patient noted to have a syncopal episode while having a bloody bowel movement in the toilet this morning.  Patient very lethargic on my arrival.  Patient noted per RN to have  had a large bloody bowel movement overnight with also a syncopal episode felt likely vasovagal.  Of 3 episodes of bloody bowel movements overnight to around midnight and 1 early this morning.  Objective: Vitals:   12/15/18 1600 12/15/18 1647 12/15/18 1648 12/15/18 1708  BP: 134/69 (!) 157/84  (!) 170/78  Pulse: 92  88 93  Resp: 16 20 18 20   Temp: 98.3 F (36.8 C) 98.5 F (36.9 C)  97.6 F (36.4 C)  TempSrc: Axillary Axillary  Axillary  SpO2: 100% 100% 100% 100%  Weight:      Height:        Intake/Output Summary (Last 24 hours) at 12/15/2018 1725 Last data filed at 12/15/2018 1600 Gross per 24 hour  Intake 1148 ml  Output -  Net 1148 ml   Filed Weights   12/14/18 1329  Weight: 95.7 kg    Examination:  General exam: Lethargic Respiratory system: Clear to auscultation. Respiratory effort normal. Cardiovascular system: S1 & S2 heard, RRR. No JVD, murmurs, rubs, gallops or clicks. No pedal edema. Gastrointestinal system: Abdomen is nondistended, soft and nontender. No organomegaly or masses felt. Normal bowel sounds heard. Central nervous system: Lethargic. No focal neurological deficits. Extremities: Symmetric 5 x 5 power. Skin: No rashes, lesions or ulcers Psychiatry: Judgement and insight appear normal. Mood & affect appropriate.     Data Reviewed: I have personally reviewed following labs and imaging studies  CBC: Recent Labs  Lab 12/13/18 1131  12/14/18 0141 12/14/18 0741 12/14/18 1315 12/15/18 0145 12/15/18 0950  WBC 9.2  --  7.2  --   --  10.3  --   NEUTROABS 6.6  --   --   --   --  8.0*  --   HGB 7.6*   < > 8.3* 8.5* 8.6* 7.7* 6.8*  HCT 23.2*   < > 26.1* 25.8* 26.6* 23.9* 21.1*  MCV 101.8*  --  99.2  --   --  102.1*  --   PLT 192  --  140*  --   --  142*  --    < > = values in this interval not displayed.   Basic Metabolic Panel: Recent Labs  Lab 12/13/18 1131 12/13/18 2130 12/14/18 0741 12/15/18 0145  NA 140  --  143 142  K 3.4*  --  3.4* 3.5   CL 105  --  109 110  CO2 25  --  29 21*  GLUCOSE 189*  --  102* 137*  BUN 23  --  18 22  CREATININE 1.11  --  0.93 0.97  CALCIUM 8.0* 7.8* 7.9* 7.7*  MG  --  2.0  --  1.8  PHOS  --  3.2  --  2.5   GFR: Estimated Creatinine Clearance: 90.9 mL/min (by C-G formula based on SCr of 0.97 mg/dL). Liver Function Tests: Recent Labs  Lab 12/13/18 1131 12/15/18 0145  AST 25 23  ALT 22 28  ALKPHOS 47 40  BILITOT 0.3 0.6  PROT 5.2* 4.7*  ALBUMIN 3.0* 2.9*   Recent Labs  Lab 12/13/18 1131  LIPASE 25   No results for input(s): AMMONIA in the last 168 hours. Coagulation Profile: Recent Labs  Lab 12/13/18 1131 12/14/18 0141  INR 1.1 1.1  Cardiac Enzymes: No results for input(s): CKTOTAL, CKMB, CKMBINDEX, TROPONINI in the last 168 hours. BNP (last 3 results) No results for input(s): PROBNP in the last 8760 hours. HbA1C: Recent Labs    12/13/18 2130  HGBA1C 5.0   CBG: Recent Labs  Lab 12/14/18 0727 12/14/18 1726 12/15/18 0752 12/15/18 1241  GLUCAP 91 94 117* 144*   Lipid Profile: No results for input(s): CHOL, HDL, LDLCALC, TRIG, CHOLHDL, LDLDIRECT in the last 72 hours. Thyroid Function Tests: Recent Labs    12/13/18 2130  TSH 2.000   Anemia Panel: Recent Labs    12/13/18 1131  VITAMINB12 434  FOLATE 23.1  FERRITIN 20*  TIBC 217*  IRON 53  RETICCTPCT 5.0*   Sepsis Labs: No results for input(s): PROCALCITON, LATICACIDVEN in the last 168 hours.  Recent Results (from the past 240 hour(s))  Urine culture     Status: None   Collection Time: 12/13/18 11:43 AM   Specimen: Urine, Clean Catch  Result Value Ref Range Status   Specimen Description   Final    URINE, CLEAN CATCH Performed at [facility], [address] [place], [place] [address]    Special Requests   Final    Normal Performed at [facility], [address] [place], [place] [address]    Culture   Final    NO GROWTH Performed at [facility], [address] [place], [place] [address]    Report Status 12/14/2018 FINAL  Final  SARS Coronavirus 2 (CEPHEID - Performed in [facility] hospital lab), Hosp Order     Status: None   Collection Time: 12/13/18 12:39 PM   Specimen: Nasopharyngeal Swab  Result Value Ref Range Status   SARS Coronavirus 2 NEGATIVE NEGATIVE Final    Comment: (NOTE) If result is NEGATIVE SARS-CoV-2 target nucleic acids are NOT DETECTED. The SARS-CoV-2 RNA is generally detectable in upper and lower  respiratory specimens during the acute phase of infection. The lowest  concentration of SARS-CoV-2 viral copies this assay can detect is 250  copies / mL. A negative result does not preclude SARS-CoV-2 infection  and should not be used as the sole basis for treatment or other  patient management decisions.  A negative result may occur with  improper specimen collection / handling, submission of specimen other  than nasopharyngeal swab, presence of viral mutation(s) within the  areas targeted by this assay, and inadequate number of viral copies  (<250 copies / mL). A negative result must be combined with clinical  observations, patient history, and epidemiological information. If result is POSITIVE SARS-CoV-2 target nucleic acids are DETECTED. The SARS-CoV-2 RNA is generally detectable in upper and lower  respiratory specimens dur ing the acute phase of infection.  Positive  results are indicative of active infection with SARS-CoV-2.  Clinical  correlation with patient history and other diagnostic information is  necessary to determine patient infection status.  Positive results do  not rule out bacterial infection or co-infection with other viruses. If result is PRESUMPTIVE POSTIVE SARS-CoV-2 nucleic acids MAY BE PRESENT.   A presumptive positive result was obtained on the submitted specimen  and confirmed on repeat testing.  While 2019 novel coronavirus  (SARS-CoV-2) nucleic acids may be present in the  submitted sample  additional confirmatory testing may be necessary for epidemiological  and / or clinical management purposes  to differentiate between  SARS-CoV-2 and other Sarbecovirus currently known to infect humans.  If clinically indicated additional testing  with an alternate test  methodology [phone]) is advised. The SARS-CoV-2 RNA is generally  detectable in upper and lower respiratory sp ecimens during the acute  phase of infection. The expected result is Negative. Fact Sheet for Patients:  BoilerBrush.com.cy Fact Sheet for Healthcare Providers: https://pope.com/ This test is not yet approved or cleared by the [place] FDA and has been authorized for detection and/or diagnosis of SARS-CoV-2 by FDA under an Emergency Use Authorization (EUA).  This EUA will remain in effect (meaning this test can be used) for the duration of the COVID-19 declaration under Section 564(b)(1) of the Act, 21 U.S.C. section 360bbb-3(b)(1), unless the authorization is terminated or revoked sooner. Performed at [facility], [address] [place], [place] [address]   MRSA PCR Screening     Status: None   Collection Time: 12/15/18 12:13 PM   Specimen: Nasal Mucosa; Nasopharyngeal  Result Value Ref Range Status   MRSA by PCR NEGATIVE NEGATIVE Final    Comment:        The GeneXpert MRSA Assay (FDA approved for NASAL specimens only), is one component of a comprehensive MRSA colonization surveillance program. It is not intended to diagnose MRSA infection nor to guide or monitor treatment for MRSA infections. Performed at [facility], [address], [place], [place] [address]          Radiology Studies: No results found.      Scheduled Meds: . sodium chloride   Intravenous Once  . allopurinol  300 mg Oral Daily  . atorvastatin  80 mg Oral Daily  . docusate sodium  100 mg Oral BID  .  insulin aspart  0-9 Units Subcutaneous TID WC  . mirtazapine  15 mg Oral QHS  . sodium chloride flush  3 mL Intravenous Q12H   Continuous Infusions: . sodium chloride    . sodium chloride       LOS: 2 days    Time spent: 40 minutes    Ramiro Harvest, MD [facility]  If 7PM-7AM, please contact night-coverage [email or web address] 12/15/2018, 5:25 PM

## 2018-12-15 NOTE — Progress Notes (Signed)
Patient had further bleeding after I saw him on rounds this morning.  Discussed with Dr. Irine Seal.  Patient has been moved to ICU and transfused 2 more units of packed cells (for a total of 4 units total since admission).  At this time, he is approximately 30 minutes into a nuclear medicine bleeding scan.  Cleotis Nipper, M.D. Pager [phone] If no answer or after 5 PM call [phone]

## 2018-12-15 NOTE — Plan of Care (Signed)
Pt was scheduled to go home today but had another large bloody stool that resulted in syncope and RR called.  Pt given bolus and also received another 2units of PRBCs.  Pt had Nuc Med blood scan per GI and awaiting results to see if f/u colonoscopy needs to be done

## 2018-12-16 ENCOUNTER — Encounter (HOSPITAL_COMMUNITY): Payer: Self-pay | Admitting: Gastroenterology

## 2018-12-16 ENCOUNTER — Inpatient Hospital Stay (HOSPITAL_COMMUNITY): Payer: Medicare HMO

## 2018-12-16 LAB — BASIC METABOLIC PANEL
Anion gap: 6 (ref 5–15)
BUN: 31 mg/dL — ABNORMAL HIGH (ref 8–23)
CO2: 23 mmol/L (ref 22–32)
Calcium: 7.5 mg/dL — ABNORMAL LOW (ref 8.9–10.3)
Chloride: 117 mmol/L — ABNORMAL HIGH (ref 98–111)
Creatinine, Ser: 1.06 mg/dL (ref 0.61–1.24)
GFR calc Af Amer: >60 mL/min (ref >60)
GFR calc non Af Amer: >60 mL/min (ref >60)
Glucose, Bld: 159 mg/dL — ABNORMAL HIGH (ref 70–99)
Potassium: 3.5 mmol/L (ref 3.5–5.1)
Sodium: 146 mmol/L — ABNORMAL HIGH (ref 135–145)

## 2018-12-16 LAB — HEMOGLOBIN AND HEMATOCRIT, BLOOD
HCT: 20.6 % — ABNORMAL LOW (ref 39.0–52.0)
HCT: 25 % — ABNORMAL LOW (ref 39.0–52.0)
Hemoglobin: 6.6 g/dL — CL (ref 13.0–17.0)
Hemoglobin: 8.1 g/dL — ABNORMAL LOW (ref 13.0–17.0)

## 2018-12-16 LAB — GLUCOSE, CAPILLARY
Glucose-Capillary: 103 mg/dL — ABNORMAL HIGH (ref 70–99)
Glucose-Capillary: 106 mg/dL — ABNORMAL HIGH (ref 70–99)
Glucose-Capillary: 110 mg/dL — ABNORMAL HIGH (ref 70–99)
Glucose-Capillary: 130 mg/dL — ABNORMAL HIGH (ref 70–99)
Glucose-Capillary: 137 mg/dL — ABNORMAL HIGH (ref 70–99)
Glucose-Capillary: 139 mg/dL — ABNORMAL HIGH (ref 70–99)
Glucose-Capillary: 168 mg/dL — ABNORMAL HIGH (ref 70–99)
Glucose-Capillary: 92 mg/dL (ref 70–99)
Glucose-Capillary: 98 mg/dL (ref 70–99)

## 2018-12-16 LAB — CBC
HCT: 22.1 % — ABNORMAL LOW (ref 39.0–52.0)
Hemoglobin: 7.1 g/dL — ABNORMAL LOW (ref 13.0–17.0)
MCH: 33.3 pg (ref 26.0–34.0)
MCHC: 32.1 g/dL (ref 30.0–36.0)
MCV: 103.8 fL — ABNORMAL HIGH (ref 80.0–100.0)
Platelets: 118 10*3/uL — ABNORMAL LOW (ref 150–400)
RBC: 2.13 MIL/uL — ABNORMAL LOW (ref 4.22–5.81)
RDW: 17.3 % — ABNORMAL HIGH (ref 11.5–15.5)
WBC: 8.9 10*3/uL (ref 4.0–10.5)
nRBC: 0.4 % — ABNORMAL HIGH (ref 0.0–0.2)

## 2018-12-16 LAB — PREPARE RBC (CROSSMATCH)

## 2018-12-16 MED ORDER — POTASSIUM CHLORIDE CRYS ER 20 MEQ PO TBCR
40.0000 meq | EXTENDED_RELEASE_TABLET | ORAL | Status: AC
  Administered 2018-12-16 (×2): 40 meq via ORAL
  Filled 2018-12-16 (×2): qty 2

## 2018-12-16 MED ORDER — CHLORHEXIDINE GLUCONATE CLOTH 2 % EX PADS
6.0000 | MEDICATED_PAD | Freq: Every day | CUTANEOUS | Status: DC
  Administered 2018-12-16 – 2018-12-22 (×7): 6 via TOPICAL

## 2018-12-16 MED ORDER — PEG 3350-KCL-NA BICARB-NACL 420 G PO SOLR
4000.0000 mL | Freq: Once | ORAL | Status: AC
  Administered 2018-12-16: 10:00:00 4000 mL via ORAL
  Filled 2018-12-16: qty 4000

## 2018-12-16 MED ORDER — SODIUM CHLORIDE 0.9% IV SOLUTION: Freq: Once | INTRAVENOUS | Status: DC

## 2018-12-16 MED ORDER — ACETAMINOPHEN 325 MG PO TABS
650.0000 mg | ORAL_TABLET | Freq: Once | ORAL | Status: AC
  Administered 2018-12-16: 650 mg via ORAL
  Filled 2018-12-16: qty 2

## 2018-12-16 NOTE — Consult Note (Signed)
[facility] Surgery Consult Note  James Gibbs 1955-08-17  [id number].    Requesting MD: Ramiro Harvest  Chief Complaint:  Weakness, abdominal pain, syncope on toilet without hitting his head Reason for Consult:  GI bleed  HPI:  Patient is a 63 year old male who presented to the ED with weakness, and 2 episodes of syncope while on the toilet.  He reports blood in his stool for the first time last Thursday, 12/09/18, he felt fine prior and after.    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  Sunday 7/19, had a BM that was hard, and passed out with daughter helping him go down. Stool was bloody at that time also; he got cleaned up and they hydrated him at home.   He had another BM on Monday 7/20 and passed out again. He was brought to the ED at that time by EMS.  It was reported he did not hit his head with the syncope.  He has a history of diverticulosis and has been followed with colonoscopies at the [facility].  Work-up in the ED shows he is afebrile his blood pressure was stable on admission. CT head was negative CMP shows a potassium of 3.4, glucose of 189, creatinine 1.11, calcium of 8, albumin of 3.  Total protein of 5.2 LFTs were all normal.  BNP was 16.5.  WBC 9.2, hemoglobin 7.6, hematocrit 23.2, platelets 192,000.  Hemoglobin A1C- 5.0, INR 1.1, TSH 2.0. Is a prior CBC 6 years ago where his hemoglobin was 14.4.  On digital exam the stool was burgundy color, and he was admitted with a GI bleed. Past medical history significant for LVH, hypertension, mitral valve prolapse, OSA with CPAP. [facility] as a primary diagnosis, and mixed hyperlipidemia.  He was admitted by medicine and transfused with 4 units of packed cells.  Bleeding scan obtained 12/15/2018 shows increased activity left upper quadrant representing a very slow hemorrhage within the splenic flexure.  He was seen by Dr. Bernette Redbird, GI  service and had an EGD which is negative today.  Dr. Matthias Hughs has recommended stopping the PPI and putting him on a regular diet.  Despite ongoing transfusions repeat H/H = 6.6/20.6.  He has more blood ordered for later today.  We are asked to see.  He is currently getting the bowel prep for a colonoscopy.  His last colonoscopy was about 5 years ago at the [place].    [id number] [employer] - hearing loss  ROS: Review of Systems  Constitutional: Negative.   HENT: Positive for hearing loss.   Eyes: Negative.   Respiratory: Negative for cough, hemoptysis, sputum production, shortness of breath and wheezing.        OSA on CPAP  Cardiovascular: Negative.   Gastrointestinal: Positive for blood in stool, constipation (chronic ) and melena. Negative for abdominal pain, diarrhea, heartburn, nausea and vomiting.  Genitourinary: Positive for frequency (on a diuretic). Negative for dysuria and urgency.  Musculoskeletal: Positive for back pain and falls (syncope x 2). Negative for joint pain, myalgias and neck pain.  Skin: Negative.   Neurological: Positive for loss of consciousness (12/12/18 & 12/13/18). Negative for dizziness, tingling, tremors, sensory change, speech change, focal weakness, seizures, weakness and headaches.  Endo/Heme/Allergies: Negative for environmental allergies and polydipsia. Does not bruise/bleed easily.  Psychiatric/Behavioral: Positive for depression. Negative for hallucinations, memory loss, substance abuse and suicidal ideas. The patient is not  nervous/anxious and does not have insomnia.     Family History  Problem Relation Age of Onset  . Hypertension Mother   . Hypertension Father     Past Medical History:  Diagnosis Date  . 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 Malignant hypertension   . Mitral valve anterior leaflet prolapse   . Mixed hyperlipidemia     Past Surgical History:  Procedure Laterality Date  . ESOPHAGOGASTRODUODENOSCOPY (EGD) WITH  PROPOFOL N/A 12/14/2018   Procedure: ESOPHAGOGASTRODUODENOSCOPY (EGD) MODERATE SEDATION;  Surgeon: Bernette RedbirdBuccini, Robert, MD;  Location: [facility] END[facility]COPY;  Service: Endoscopy;  Laterality: N/A;  . none      Social History:  reports that he has never smoked. He has never used smokeless tobacco. He reports that he does not drink alcohol or use drugs. ETOH:  @16  - quit 1985 Tobacco:  @16  , -quit 1985 Drugs:  None Worked in [employer]  Allergies: No Known Allergies  Medications Prior to Admission  Medication Sig Dispense Refill  . allopurinol (ZYLOPRIM) 300 MG tablet Take 300 mg by mouth daily.    Marland Kitchen. amLODipine (NORVASC) 10 MG tablet Take 10 mg by mouth daily.    . Ascorbic Acid (VITAMIN C) 100 MG tablet Take 100 mg by mouth daily.    Marland Kitchen. aspirin 81 MG tablet Take 81 mg by mouth daily.    Marland Kitchen. atorvastatin (LIPITOR) 80 MG tablet Take 80 mg by mouth daily.    . cholecalciferol (VITAMIN D) 400 UNITS TABS tablet Take 400 Units by mouth daily.    . Dimethyl Fumarate (TECFIDERA) 240 MG CPDR Take 240 mg by mouth 2 (two) times daily.    . Hydrochlorothiazide (MICROZIDE PO) Take 25 mg by mouth daily.    Marland Kitchen. lisinopril (PRINIVIL,ZESTRIL) 40 MG tablet Take 40 mg by mouth daily.    Marland Kitchen. METFORMIN HCL PO Take 500 mg by mouth daily.     . Omega-3 Fatty Acids (FISH OIL) 1000 MG CAPS Take 3,000 mg by mouth.     . potassium chloride SA (K-DUR) 20 MEQ tablet Take 20 mEq by mouth daily.    . sildenafil (VIAGRA) 100 MG tablet Take 100 mg by mouth daily as needed for erectile dysfunction.      Blood pressure 124/75, pulse (!) 111, temperature 98.2 F (36.8 C), resp. rate 20, height 5\' 11"  (1.803 m), weight 95 kg, SpO2 100 %. Physical Exam: Physical Exam Constitutional:      General: He is not in acute distress.    Appearance: He is well-developed. He is not ill-appearing, toxic-appearing or diaphoretic.  HENT:     Head: Normocephalic and atraumatic.     Mouth/Throat:     Mouth: Mucous membranes are moist.   Eyes:     General: No scleral icterus. Cardiovascular:     Rate and Rhythm: Normal rate and regular rhythm.     Heart sounds: No murmur.  Pulmonary:     Effort: Pulmonary effort is normal.     Breath sounds: Normal breath sounds.  Abdominal:     General: Abdomen is protuberant. A surgical scar is present. Bowel sounds are normal. There is no distension. There are no signs of injury.     Palpations: Abdomen is soft.     Tenderness: There is no abdominal tenderness.     Comments: Scar from prior umbilical hernia  Genitourinary:    Rectum: Guaiac result positive.  Skin:    General: Skin is dry.     Coloration: Skin is  not cyanotic, jaundiced, mottled or pale.     Findings: No erythema or rash.  Neurological:     General: No focal deficit present.     Mental Status: He is alert and oriented to person, place, and time.  Psychiatric:        Mood and Affect: Mood normal. Mood is not anxious or depressed.        Behavior: Behavior normal.     Results for orders placed or performed during the hospital encounter of 12/13/18 (from the past 48 hour(s))  Hemoglobin and hematocrit, blood     Status: Abnormal   Collection Time: 12/14/18  1:15 PM  Result Value Ref Range   Hemoglobin 8.6 (L) 13.0 - 17.0 g/dL   HCT 26.6 (L) 39.0 - 52.0 %    Comment: Performed at [facility], [place] [address], [place], [place] [address]  Glucose, capillary     Status: None   Collection Time: 12/14/18  5:26 PM  Result Value Ref Range   Glucose-Capillary 94 70 - 99 mg/dL   Comment 1 Notify RN    Comment 2 Document in Chart   Magnesium     Status: None   Collection Time: 12/15/18  1:45 AM  Result Value Ref Range   Magnesium 1.8 1.7 - 2.4 mg/dL    Comment: Performed at [facility], [place] [address] [place], [place] [address]  Phosphorus     Status: None   Collection Time: 12/15/18  1:45 AM  Result Value Ref Range   Phosphorus 2.5 2.5 - 4.6 mg/dL    Comment: Performed  at [facility], [place] [address] [place], [place] [address]  CBC with Differential/Platelet     Status: Abnormal   Collection Time: 12/15/18  1:45 AM  Result Value Ref Range   WBC 10.3 4.0 - 10.5 K/uL   RBC 2.34 (L) 4.22 - 5.81 MIL/uL   Hemoglobin 7.7 (L) 13.0 - 17.0 g/dL   HCT 23.9 (L) 39.0 - 52.0 %   MCV 102.1 (H) 80.0 - 100.0 fL   MCH 32.9 26.0 - 34.0 pg   MCHC 32.2 30.0 - 36.0 g/dL   RDW 18.2 (H) 11.5 - 15.5 %   Platelets 142 (L) 150 - 400 K/uL   nRBC 0.2 0.0 - 0.2 %   Neutrophils Relative % 78 %   Neutro Abs 8.0 (H) 1.7 - 7.7 K/uL   Lymphocytes Relative 15 %   Lymphs Abs 1.5 0.7 - 4.0 K/uL   Monocytes Relative 6 %   Monocytes Absolute 0.7 0.1 - 1.0 K/uL   Eosinophils Relative 1 %   Eosinophils Absolute 0.1 0.0 - 0.5 K/uL   Basophils Relative 0 %   Basophils Absolute 0.0 0.0 - 0.1 K/uL   Immature Granulocytes 0 %   Abs Immature Granulocytes 0.03 0.00 - 0.07 K/uL    Comment: Performed at [facility], [place] [address], [place], [place] [address]  Comprehensive metabolic panel     Status: Abnormal   Collection Time: 12/15/18  1:45 AM  Result Value Ref Range   Sodium 142 135 - 145 mmol/L   Potassium 3.5 3.5 - 5.1 mmol/L   Chloride 110 98 - 111 mmol/L   CO2 21 (L) 22 - 32 mmol/L   Glucose, Bld 137 (H) 70 - 99 mg/dL   BUN 22 8 - 23 mg/dL   Creatinine, Ser 0.97 0.61 - 1.24 mg/dL   Calcium 7.7 (L) 8.9 - 10.3  mg/dL   Total Protein 4.7 (L) 6.5 - 8.1 g/dL   Albumin 2.9 (L) 3.5 - 5.0 g/dL   AST 23 15 - 41 U/L   ALT 28 0 - 44 U/L   Alkaline Phosphatase 40 38 - 126 U/L   Total Bilirubin 0.6 0.3 - 1.2 mg/dL   GFR calc non Af Amer >60 >60 mL/min   GFR calc Af Amer >60 >60 mL/min   Anion gap 11 5 - 15    Comment: Performed at [facility], [address] [place], [place] [address]  Glucose, capillary     Status: Abnormal   Collection Time: 12/15/18  7:52 AM  Result Value Ref Range   Glucose-Capillary 117 (H) 70 - 99 mg/dL    Comment 1 Notify RN    Comment 2 Document in Chart   Hemoglobin and hematocrit, blood     Status: Abnormal   Collection Time: 12/15/18  9:50 AM  Result Value Ref Range   Hemoglobin 6.8 (LL) 13.0 - 17.0 g/dL    Comment: This critical result has verified and been called to OSBOURNE, K. RN by Patrina LeveringNicole McCoy on 07 22 2020 at 1032, and has been read back. Critical Result Verified   HCT 21.1 (L) 39.0 - 52.0 %    Comment: Performed at [facility], [address] [place], [place] [address]  Prepare RBC     Status: None   Collection Time: 12/15/18 11:29 AM  Result Value Ref Range   Order Confirmation      ORDER PROCESSED BY BLOOD BANK Performed at [facility], [address] [place], [place] [address]   MRSA PCR Screening     Status: None   Collection Time: 12/15/18 12:13 PM   Specimen: Nasal Mucosa; Nasopharyngeal  Result Value Ref Range   MRSA by PCR NEGATIVE NEGATIVE    Comment:        The GeneXpert MRSA Assay (FDA approved for NASAL specimens only), is one component of a comprehensive MRSA colonization surveillance program. It is not intended to diagnose MRSA infection nor to guide or monitor treatment for MRSA infections. Performed at [facility], [address] [place], [place] [address]   Glucose, capillary     Status: Abnormal   Collection Time: 12/15/18 12:41 PM  Result Value Ref Range   Glucose-Capillary 144 (H) 70 - 99 mg/dL  Glucose, capillary     Status: Abnormal   Collection Time: 12/15/18  6:26 PM  Result Value Ref Range   Glucose-Capillary 110 (H) 70 - 99 mg/dL  Glucose, capillary     Status: None   Collection Time: 12/15/18  7:44 PM  Result Value Ref Range   Glucose-Capillary 98 70 - 99 mg/dL  Hemoglobin and hematocrit, blood     Status: Abnormal   Collection Time: 12/15/18  8:44 PM  Result Value Ref Range   Hemoglobin 8.0 (L) 13.0 - 17.0 g/dL   HCT 86.525.8 (L) 78.439.0 - 69.652.0 %    Comment: Performed at  [facility], [address] [place], [place] [address]  Glucose, capillary     Status: Abnormal   Collection Time: 12/15/18 11:16 PM  Result Value Ref Range   Glucose-Capillary 137 (H) 70 - 99 mg/dL  Basic metabolic panel     Status: Abnormal   Collection Time: 12/16/18  2:09 AM  Result Value Ref Range   Sodium 146 (H) 135 - 145 mmol/L   Potassium 3.5  3.5 - 5.1 mmol/L   Chloride 117 (H) 98 - 111 mmol/L   CO2 23 22 - 32 mmol/L   Glucose, Bld 159 (H) 70 - 99 mg/dL   BUN 31 (H) 8 - 23 mg/dL   Creatinine, Ser 1.611.06 0.61 - 1.24 mg/dL   Calcium 7.5 (L) 8.9 - 10.3 mg/dL   GFR calc non Af Amer >60 >60 mL/min   GFR calc Af Amer >60 >60 mL/min   Anion gap 6 5 - 15    Comment: Performed at [facility], [address] [place], [place] [address]  CBC     Status: Abnormal   Collection Time: 12/16/18  2:09 AM  Result Value Ref Range   WBC 8.9 4.0 - 10.5 K/uL   RBC 2.13 (L) 4.22 - 5.81 MIL/uL   Hemoglobin 7.1 (L) 13.0 - 17.0 g/dL   HCT 54.022.1 (L) 98.139.0 - 19.152.0 %   MCV 103.8 (H) 80.0 - 100.0 fL   MCH 33.3 26.0 - 34.0 pg   MCHC 32.1 30.0 - 36.0 g/dL   RDW 47.817.3 (H) 29.511.5 - 62.115.5 %   Platelets 118 (L) 150 - 400 K/uL    Comment: REPEATED TO VERIFY PLATELET COUNT CONFIRMED BY SMEAR SPECIMEN CHECKED FOR CLOTS Immature Platelet Fraction may be clinically indicated, consider ordering this additional test [id number]    nRBC 0.4 (H) 0.0 - 0.2 %    Comment: Performed at [facility], [address] [place], [place] [address]  Glucose, capillary     Status: Abnormal   Collection Time: 12/16/18  3:43 AM  Result Value Ref Range   Glucose-Capillary 139 (H) 70 - 99 mg/dL  Glucose, capillary     Status: Abnormal   Collection Time: 12/16/18  8:31 AM  Result Value Ref Range   Glucose-Capillary 168 (H) 70 - 99 mg/dL  Prepare RBC     Status: None   Collection Time: 12/16/18  8:39 AM  Result Value Ref Range   Order Confirmation      ORDER PROCESSED  BY BLOOD BANK Performed at [facility], [address] [place], [place] [address]   Hemoglobin and hematocrit, blood     Status: Abnormal   Collection Time: 12/16/18  8:55 AM  Result Value Ref Range   Hemoglobin 6.6 (LL) 13.0 - 17.0 g/dL    Comment: This critical result has verified and been called to [facility]. RN by Patrina LeveringNicole McCoy on 07 23 2020 at 0920, and has been read back. CRITICAL RESULT VERIFIED   HCT 20.6 (L) 39.0 - 52.0 %    Comment: Performed at [facility], [address] [place], [place] [address]   Ct Head Wo Contrast  Result Date: 12/16/2018 CLINICAL DATA:  63 year old male with past medical history of hypertension, hyperlipidemia, mitral valve prolapse,DM II, presenting with questionable syncope presyncopal episode, noting blood in stool." EXAM: CT HEAD WITHOUT CONTRAST TECHNIQUE: Contiguous axial images were obtained from the base of the skull through the vertex without intravenous contrast. COMPARISON:  None. FINDINGS: Brain: No evidence of acute infarction, hemorrhage, hydrocephalus, extra-axial collection or mass lesion/mass effect. There is ventricular and sulcal enlargement reflecting mild to moderate generalized atrophy, advanced for age. Patchy areas of white matter hypoattenuation consistent with moderate chronic microvascular ischemic change. There are small old basal ganglia and deep white matter lacunar infarcts. Vascular: No hyperdense vessel or unexpected calcification. Skull: Normal. Negative for fracture or focal lesion. Sinuses/Orbits: Globes and orbits are unremarkable. The  visualized sinuses and mastoid air cells are clear. Other: None. IMPRESSION: 1. No acute intracranial abnormalities. 2. Atrophy advanced for age. 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 Electronically Signed   By: Amie Portland M.D.   On: 12/16/2018 09:17   Nm Gi Blood Loss  Result  Date: 12/15/2018 CLINICAL DATA:  Diverticular hemorrhage, last active hemorrhage was 1 day ago EXAM: NUCLEAR MEDICINE GASTROINTESTINAL BLEEDING SCAN TECHNIQUE: Sequential abdominal images were obtained following intravenous administration of Tc-51m labeled red blood cells. RADIOPHARMACEUTICALS:  22.9 mCi Tc-32m pertechnetate in-vitro labeled red cells. COMPARISON:  None. FINDINGS: There is adequate uptake throughout the liver and cardiac blood pool and vasculature. Pooling in the urinary bladder with some bony uptake is noted. The first 60 minutes of imaging were significantly limited due to patient motion artifact. Imaging to 2 hours was then performed and demonstrates a small focal area of increased activity in the left upper quadrant below the location of the spleen. This stays in the left upper quadrant without significant mobility. This may represent a very slow diverticular hemorrhage in the region of the splenic flexure. No other focal area of increased uptake is noted to suggest active hemorrhage. IMPRESSION:.: IMPRESSION:. Small area of increased activity in the left upper quadrant likely representing a very slow hemorrhage within the splenic flexure. Direct visualization may be helpful Electronically Signed   By: Alcide Clever M.D.   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  Colonoscopy pending; prep is being given now.  I agree with current measures.  If a site is found for the bleeding we would recommend IR intervention as a first step and if that is not possible we would discuss surgery if bleeding persist.    We will follow with you.     [facility] Surgery Pager:  [phone]    12/16/2018 2:32 PM

## 2018-12-16 NOTE — Progress Notes (Signed)
Patient's bleeding scan last night showed possible slight activity in region of splenic flexure.  Patient has had some further hematochezia but not really brisk bleeding---did get presyncopal with one trip to bathroom although not clear if this is more a reflection of volume contraction or vagal stimulation or both.  Hgb has dropped again to 6.6.  On exam, pt had stable VS and was sitting in bed in absolutely no distress.  Skin slightly cool.  Alert, coherent, good spirits.  IMPR:  Ongoing/intermittent GI bleed of presumed diverticular origin.  PLAN:  Prep today, colonoscopy tomorrow.  Petra Kuba, purpose, risks, limitations discussed w/ pt (and w/ daughter Nira Conn by FaceTime).  The main purpose of the exam would be to exclude alternative causes of bleeding other than diverticulosis, such as mucosal ulcerations or vascular ectasia--it will not likely alter his diagnosis or his natural history or his management, but it will help [place] be more confident about what we are doing.  Time spent at bedside with patient approximately 20 minutes.  Cleotis Nipper, M.D. Pager [phone] If no answer or after 5 PM call [phone]

## 2018-12-16 NOTE — Progress Notes (Signed)
Dr. Grandville Silos in the unit and was notified of Nuclear Medicine study results. No orders received at this time.

## 2018-12-16 NOTE — Progress Notes (Signed)
Patient stated that he needed to have a bowel movement, assisted patient to bedside commode. HR rose to 142. Asked patient if his heart rate normally gets up that high with ambulation and noticed that patient had become unresponsive. Patient unable to control his bladder, voided on the floor. Pupils reactive. Charge nurse called in to assist. Helped patient back to bed with lift. Once patient was placed in bed, he started to wake up. States that he did not remember passing out but knows that he has done it a few times. Around 400 cc of bright red bloody stool noted in [facility]. Dr. Grandville Silos notified. TED hose, stat head CT, STAT H&H, and 2 units PRBC ordered. Will continue to monitor at this time.

## 2018-12-16 NOTE — Progress Notes (Addendum)
PROGRESS NOTE    [name]  ZOX:[id number] DOB: [birth year] DOA: 12/13/2018 PCP: [name], [name], MD    Brief Narrative:  HPI per Dr. Nevin BloodgoodSeyed Gibbs on 12/13/2018  [name] a 63 y.o.malewith medical history significant [id number] year old male with past medical history of hypertension, hyperlipidemia, mitral valve prolapse,DM II,presenting with questionable syncope presyncopal episode,noting blood in stool.  Patient Denies having: Fever, Chills, Cough, SOB, Chest Pain, Abd pain, N/V/D, headache, dizziness, lightheadedness, joint pain, rash, open wounds  ED Course: Mildly hypotensive, H&H down from baseline 2 units of packed red blood cell has been ordered pending transfusion GI Dr.Buccinihas been consulted pending evaluation recommendations.  Patient to be admitted for GI evaluation, anemia due to GI bleed blood transfusion  Patient subsequently underwent upper endoscopy on 12/14/2018 with no source of bleeding.   Assessment & Plan:   Principal Problem:   GIB (gastrointestinal bleeding) Active Problems:   Syncope, vasovagal   MITRAL REGURGITATION   HTN (hypertension)   Hypotension   Gout   Acute blood loss anemia  1 vasovagal syncope Patient noted to have a vasovagal syncope again this morning while on the bedside commode having a bloody bowel movement.  Patient has been having vasovagal syncopal episodes since admission with ongoing bloody bowel movements and ongoing bleeding.  Repeat H&H with hemoglobin at 6.6 this morning.  Patient has received 4 units of packed red blood cells during this hospitalization and will transfuse another 2 units of packed red blood cells.  Continue IV fluids.  Place on TED hose.  CT head which was ordered this morning with no acute abnormalities.  Monitor closely in the ICU.  GI following.  2.  Acute GI bleed suspect diverticular bleed/acute blood loss anemia Patient admitted with questionable syncopal/presyncopal episode  noted to be mildly hypotensive on admission with anemia.  Hemoglobin on admission was down to 7.6.  Patient transfused 2 units packed red blood cells.  Hemoglobin noted to be 6.8 the morning of 12/15/2018 and patient transfused another 2 units of packed red blood cells.  Patient with ongoing bloody bowel movements yesterday, and overnight.  Patient with large bloody bowel movement this morning per RN with a vasovagal syncopal episode.  Patient seen by GI underwent upper endoscopy with no source of bleeding noted.  Patient with vasovagal syncopal episodes likely attributed to acute GI bleed.  Patient currently in the ICU unit.  Patient subsequently underwent a nuclear medicine GI bleeding scan on 12/15/2018 which showed a small area of increased activity left upper quadrant likely representing very slow hemorrhage within the splenic flexure.  Patient with repeat H&H of 6.6 this morning.  Will transfuse another 2 units of packed red blood cells.  Patient would have received 6 units of packed red blood cells by today with ongoing bleeding.  Patient being followed by GI.  RN given patient GoLYTELY and patient likely to undergo a colonoscopy for further evaluation.  Due to patient's ongoing bleeding, vasovagal syncope, now up to 6 units of packed red blood cells will consult with general surgery for further evaluation and management.  GI following and I appreciate the input and recommendations.   3.  Mitral regurgitation Stable.  Monitor closely for volume overload as patient with ongoing bleeding requiring transfusion of packed red blood cells.  4.  Mild hypotension Felt secondary to volume depletion in the setting of GI bleed.  Patient's antihypertensive medications of HCTZ and lisinopril on hold.  Norvasc has been discontinued.  Patient to be transfused  2 more units of packed red blood cells.  Follow.   5.  Hyperlipidemia Continue statin.  6.  Type 2 diabetes mellitus Oral hypoglycemic agents on hold.   Continue SSI.  7.  Gout Stable.  Continue allopurinol.  8.  Hypokalemia K. Dur 40 mEq p.o. x1.  Potassium at 3.5.  Follow.   DVT prophylaxis: SCDs Code Status: Full Family Communication: updated daughter on telephone.  Disposition Plan: Remain in ICU.  Patient with ongoing bleeding with vasovagal syncope and unsafe to be discharged at this time and requiring inpatient treatment and management.    Consultants:   Gastroenterology: Dr. Matthias HughsBuccini 12/13/2018  Procedures:   Chest x-ray 12/13/2018  Tagged red blood cell scan 12/15/2018  Upper endoscopy 12/14/2018  2 units transfusion packed red blood cells 12/13/2018  2 units transfusion packed red blood cells 12/15/2018--  2 units transfusion packed red blood cells 12/16/2018  Antimicrobials:   None   Subjective: Patient noted to have a vasovagal syncopal episode this morning while on the bedside commode having a bloody bowel movement per RN.  Patient also noted to have bloody bowel movement overnight with vasovagal syncope again.  Patient now alert following commands appropriately and answering questions appropriately while laying supine in the bed.   Objective: Vitals:   12/16/18 0400 12/16/18 0500 12/16/18 0600 12/16/18 0916  BP: (!) 112/58 122/77 133/74 (!) 80/52  Pulse: 82 84 81 (!) 109  Resp: 19 17 19  (!) 21  Temp: (!) 97.5 F (36.4 C)   97.9 F (36.6 C)  TempSrc: Oral   Oral  SpO2: 100% 100% 100% 100%  Weight:  95 kg    Height:        Intake/Output Summary (Last 24 hours) at 12/16/2018 0924 Last data filed at 12/16/2018 0600 Gross per 24 hour  Intake 1898.59 ml  Output 775 ml  Net 1123.59 ml   Filed Weights   12/14/18 1329 12/16/18 0500  Weight: 95.7 kg 95 kg    Examination:  General exam: Alert. Respiratory system: Lungs clear to auscultation bilaterally.  No wheezes, no crackles, no rhonchi.  Normal respiratory effort.  Speaking in full sentences. Cardiovascular system: Regular rate rhythm no murmurs  rubs or gallops.  No JVD.  No lower extremity edema.  Gastrointestinal system: Abdomen is soft, nontender, nondistended, positive bowel sounds.  No rebound.  No guarding.  Central nervous system: Alert.  Moving extremities spontaneously.  Extremities: Symmetric 5 x 5 power. Skin: No rashes, lesions or ulcers Psychiatry: Judgement and insight appear normal. Mood & affect appropriate.     Data Reviewed: I have personally reviewed following labs and imaging studies  CBC: Recent Labs  Lab 12/13/18 1131  12/14/18 0141  12/15/18 0145 12/15/18 0950 12/15/18 2044 12/16/18 0209 12/16/18 0855  WBC 9.2  --  7.2  --  10.3  --   --  8.9  --   NEUTROABS 6.6  --   --   --  8.0*  --   --   --   --   HGB 7.6*   < > 8.3*   < > 7.7* 6.8* 8.0* 7.1* 6.6*  HCT 23.2*   < > 26.1*   < > 23.9* 21.1* 25.8* 22.1* 20.6*  MCV 101.8*  --  99.2  --  102.1*  --   --  103.8*  --   PLT 192  --  140*  --  142*  --   --  118*  --    < > =  values in this interval not displayed.   Basic Metabolic Panel: Recent Labs  Lab 12/13/18 1131 12/13/18 2130 12/14/18 0741 12/15/18 0145 12/16/18 0209  NA 140  --  143 142 146*  K 3.4*  --  3.4* 3.5 3.5  CL 105  --  109 110 117*  CO2 25  --  29 21* 23  GLUCOSE 189*  --  102* 137* 159*  BUN 23  --  18 22 31*  CREATININE 1.11  --  0.93 0.97 1.06  CALCIUM 8.0* 7.8* 7.9* 7.7* 7.5*  MG  --  2.0  --  1.8  --   PHOS  --  3.2  --  2.5  --    GFR: Estimated Creatinine Clearance: 82.9 mL/min (by C-G formula based on SCr of 1.06 mg/dL). Liver Function Tests: Recent Labs  Lab 12/13/18 1131 12/15/18 0145  AST 25 23  ALT 22 28  ALKPHOS 47 40  BILITOT 0.3 0.6  PROT 5.2* 4.7*  ALBUMIN 3.0* 2.9*   Recent Labs  Lab 12/13/18 1131  LIPASE 25   No results for input(s): AMMONIA in the last 168 hours. Coagulation Profile: Recent Labs  Lab 12/13/18 1131 12/14/18 0141  INR 1.1 1.1   Cardiac Enzymes: No results for input(s): CKTOTAL, CKMB, CKMBINDEX, TROPONINI in the  last 168 hours. BNP (last 3 results) No results for input(s): PROBNP in the last 8760 hours. HbA1C: Recent Labs    12/13/18 2130  HGBA1C 5.0   CBG: Recent Labs  Lab 12/15/18 1826 12/15/18 1944 12/15/18 2316 12/16/18 0343 12/16/18 0831  GLUCAP 110* 98 137* 139* 168*   Lipid Profile: No results for input(s): CHOL, HDL, LDLCALC, TRIG, CHOLHDL, LDLDIRECT in the last 72 hours. Thyroid Function Tests: Recent Labs    12/13/18 2130  TSH 2.000   Anemia Panel: Recent Labs    12/13/18 1131  VITAMINB12 434  FOLATE 23.1  FERRITIN 20*  TIBC 217*  IRON 53  RETICCTPCT 5.0*   Sepsis Labs: No results for input(s): PROCALCITON, LATICACIDVEN in the last 168 hours.  Recent Results (from the past 240 hour(s))  Urine culture     Status: None   Collection Time: 12/13/18 11:43 AM   Specimen: Urine, Clean Catch  Result Value Ref Range Status   Specimen Description   Final    URINE, CLEAN CATCH Performed at [facility], [place] [address] [place], [place] [address]    Special Requests   Final    Normal Performed at [facility], [place] [address] [place], [place] [address]    Culture   Final    NO GROWTH Performed at [facility], [place] [address], [place], [place] [address]    Report Status 12/14/2018 FINAL  Final  SARS Coronavirus 2 (CEPHEID - Performed in [place] hospital lab), Hosp Order     Status: None   Collection Time: 12/13/18 12:39 PM   Specimen: Nasopharyngeal Swab  Result Value Ref Range Status   SARS Coronavirus 2 NEGATIVE NEGATIVE Final    Comment: (NOTE) If result is NEGATIVE SARS-CoV-2 target nucleic acids are NOT DETECTED. The SARS-CoV-2 RNA is generally detectable in upper and lower  respiratory specimens during the acute phase of infection. The lowest  concentration of SARS-CoV-2 viral copies this assay can detect is 250  copies / mL. A negative result does not preclude SARS-CoV-2 infection  and should not be  used as the sole basis for treatment or other  patient management decisions.  A negative result may occur with  improper specimen collection / handling, submission of specimen other  than nasopharyngeal swab, presence of viral mutation(s) within the  areas targeted by this assay, and inadequate number of viral copies  (<250 copies / mL). A negative result must be combined with clinical  observations, patient history, and epidemiological information. If result is POSITIVE SARS-CoV-2 target nucleic acids are DETECTED. The SARS-CoV-2 RNA is generally detectable in upper and lower  respiratory specimens dur ing the acute phase of infection.  Positive  results are indicative of active infection with SARS-CoV-2.  Clinical  correlation with patient history and other diagnostic information is  necessary to determine patient infection status.  Positive results do  not rule out bacterial infection or co-infection with other viruses. If result is PRESUMPTIVE POSTIVE SARS-CoV-2 nucleic acids MAY BE PRESENT.   A presumptive positive result was obtained on the submitted specimen  and confirmed on repeat testing.  While 2019 novel coronavirus  (SARS-CoV-2) nucleic acids may be present in the submitted sample  additional confirmatory testing may be necessary for epidemiological  and / or clinical management purposes  to differentiate between  SARS-CoV-2 and other Sarbecovirus currently known to infect humans.  If clinically indicated additional testing with an alternate test  methodology [phone]) is advised. The SARS-CoV-2 RNA is generally  detectable in upper and lower respiratory sp ecimens during the acute  phase of infection. The expected result is Negative. Fact Sheet for Patients:  BoilerBrush.com.cy Fact Sheet for Healthcare Providers: https://pope.com/ This test is not yet approved or cleared by the [place] FDA and has been authorized  for detection and/or diagnosis of SARS-CoV-2 by FDA under an Emergency Use Authorization (EUA).  This EUA will remain in effect (meaning this test can be used) for the duration of the COVID-19 declaration under Section 564(b)(1) of the Act, 21 U.S.C. section 360bbb-3(b)(1), unless the authorization is terminated or revoked sooner. Performed at [facility], [address] [place], [place] [address]   MRSA PCR Screening     Status: None   Collection Time: 12/15/18 12:13 PM   Specimen: Nasal Mucosa; Nasopharyngeal  Result Value Ref Range Status   MRSA by PCR NEGATIVE NEGATIVE Final    Comment:        The GeneXpert MRSA Assay (FDA approved for NASAL specimens only), is one component of a comprehensive MRSA colonization surveillance program. It is not intended to diagnose MRSA infection nor to guide or monitor treatment for MRSA infections. Performed at [facility], [address] [place], [place] [address]          Radiology Studies: Ct Head Wo Contrast  Result Date: 12/16/2018 CLINICAL DATA:  63 year old male with past medical history of hypertension, hyperlipidemia, mitral valve prolapse,DM II, presenting with questionable syncope presyncopal episode, noting blood in stool." EXAM: CT HEAD WITHOUT CONTRAST TECHNIQUE: Contiguous axial images were obtained from the base of the skull through the vertex without intravenous contrast. COMPARISON:  None. FINDINGS: Brain: No evidence of acute infarction, hemorrhage, hydrocephalus, extra-axial collection or mass lesion/mass effect. There is ventricular and sulcal enlargement reflecting mild to moderate generalized atrophy, advanced for age. Patchy areas of white matter hypoattenuation consistent with moderate chronic microvascular ischemic change. There are small old basal ganglia and deep white matter lacunar infarcts. Vascular: No hyperdense vessel or unexpected calcification. Skull: Normal. Negative  for fracture or focal lesion. Sinuses/Orbits: Globes and orbits are unremarkable. The visualized sinuses and mastoid air cells are clear.  Other: None. IMPRESSION: 1. No acute intracranial abnormalities. 2. Atrophy advanced for age. 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 Electronically Signed   By: Amie Portlandavid  Ormond M.D.   On: 12/16/2018 09:17   Nm Gi Blood Loss  Result Date: 12/15/2018 CLINICAL DATA:  Diverticular hemorrhage, last active hemorrhage was 1 day ago EXAM: NUCLEAR MEDICINE GASTROINTESTINAL BLEEDING SCAN TECHNIQUE: Sequential abdominal images were obtained following intravenous administration of Tc-1364m labeled red blood cells. RADIOPHARMACEUTICALS:  22.9 mCi Tc-6064m pertechnetate in-vitro labeled red cells. COMPARISON:  None. FINDINGS: There is adequate uptake throughout the liver and cardiac blood pool and vasculature. Pooling in the urinary bladder with some bony uptake is noted. The first 60 minutes of imaging were significantly limited due to patient motion artifact. Imaging to 2 hours was then performed and demonstrates a small focal area of increased activity in the left upper quadrant below the location of the spleen. This stays in the left upper quadrant without significant mobility. This may represent a very slow diverticular hemorrhage in the region of the splenic flexure. No other focal area of increased uptake is noted to suggest active hemorrhage. IMPRESSION:.: IMPRESSION:. Small area of increased activity in the left upper quadrant likely representing a very slow hemorrhage within the splenic flexure. Direct visualization may be helpful Electronically Signed   By: Alcide CleverMark  Lukens M.D.   On: 12/15/2018 19:09        Scheduled Meds:  sodium chloride   Intravenous Once   sodium chloride   Intravenous Once   allopurinol  300 mg Oral Daily   atorvastatin  80 mg Oral Daily   Chlorhexidine Gluconate Cloth  6  each Topical Daily   docusate sodium  100 mg Oral BID   insulin aspart  0-9 Units Subcutaneous Q4H   mirtazapine  15 mg Oral QHS   polyethylene glycol-electrolytes  4,000 mL Oral Once   potassium chloride  40 mEq Oral Q4H   sodium chloride flush  3 mL Intravenous Q12H   Continuous Infusions:  sodium chloride     dextrose 5 % and 0.9% NaCl 75 mL/hr at 12/16/18 0906   sodium chloride       LOS: 3 days    Time spent: 40 minutes    Ramiro Harvestaniel Arelly Whittenberg, MD [facility]  If 7PM-7AM, please contact night-coverage [email or web address] 12/16/2018, 9:24 AM

## 2018-12-16 NOTE — Progress Notes (Signed)
OT Cancellation Note  Patient Details Name: James Gibbs MRN: [id number] DOB: [birth year]   Cancelled Treatment:    Reason Eval/Treat Not Completed: Medical issues which prohibited therapy. Spoke to RN   Pt passed out when using BSC earlier.  Will check back another day  [place] Giammona 12/16/2018, 11:56 AM  Lesle Chris, OTR/L Acute Rehabilitation Services [phone] [facility] pager [phone] office 12/16/2018

## 2018-12-16 NOTE — Progress Notes (Signed)
Late entry, Time out was performed for EGD moderate sedation by Primary RN Herbert Spires, Larene [place]) GI (Buccini) Endo tech Ladona Ridgel)

## 2018-12-17 ENCOUNTER — Inpatient Hospital Stay (HOSPITAL_COMMUNITY): Payer: Medicare HMO | Admitting: Registered Nurse

## 2018-12-17 ENCOUNTER — Encounter (HOSPITAL_COMMUNITY)
Admission: EM | Disposition: A | Discharge status: Home Health | Payer: Self-pay | Source: Home / Self Care | Attending: Internal Medicine

## 2018-12-17 ENCOUNTER — Encounter (HOSPITAL_COMMUNITY): Payer: Self-pay | Admitting: *Deleted

## 2018-12-17 HISTORY — PX: COLONOSCOPY WITH PROPOFOL: SHX5780

## 2018-12-17 LAB — GLUCOSE, CAPILLARY
Glucose-Capillary: 104 mg/dL — ABNORMAL HIGH (ref 70–99)
Glucose-Capillary: 106 mg/dL — ABNORMAL HIGH (ref 70–99)
Glucose-Capillary: 111 mg/dL — ABNORMAL HIGH (ref 70–99)
Glucose-Capillary: 91 mg/dL (ref 70–99)

## 2018-12-17 LAB — TYPE AND SCREEN
ABO/RH(D): O POS
Antibody Screen: NEGATIVE
Unit division: 0
Unit division: 0
Unit division: 0
Unit division: 0
Unit division: 0
Unit division: 0

## 2018-12-17 LAB — BPAM RBC
Blood Product Expiration Date: 202008152359
Blood Product Expiration Date: 202008172359
Blood Product Expiration Date: 202008172359
Blood Product Expiration Date: 202008172359
Blood Product Expiration Date: 202008182359
Blood Product Expiration Date: 202008182359
ISSUE DATE / TIME: 202007201353
ISSUE DATE / TIME: 202007201723
ISSUE DATE / TIME: 202007221318
ISSUE DATE / TIME: 202007221631
ISSUE DATE / TIME: 202007230908
ISSUE DATE / TIME: 202007231217
Unit Type and Rh: 5100
Unit Type and Rh: 5100
Unit Type and Rh: 5100
Unit Type and Rh: 5100
Unit Type and Rh: 5100
Unit Type and Rh: 5100

## 2018-12-17 LAB — BASIC METABOLIC PANEL
Anion gap: 9 (ref 5–15)
BUN: 24 mg/dL — ABNORMAL HIGH (ref 8–23)
CO2: 21 mmol/L — ABNORMAL LOW (ref 22–32)
Calcium: 7.6 mg/dL — ABNORMAL LOW (ref 8.9–10.3)
Chloride: 117 mmol/L — ABNORMAL HIGH (ref 98–111)
Creatinine, Ser: 1.03 mg/dL (ref 0.61–1.24)
GFR calc Af Amer: >60 mL/min (ref >60)
GFR calc non Af Amer: >60 mL/min (ref >60)
Glucose, Bld: 120 mg/dL — ABNORMAL HIGH (ref 70–99)
Potassium: 3.8 mmol/L (ref 3.5–5.1)
Sodium: 147 mmol/L — ABNORMAL HIGH (ref 135–145)

## 2018-12-17 LAB — CBC
HCT: 26.8 % — ABNORMAL LOW (ref 39.0–52.0)
Hemoglobin: 8.5 g/dL — ABNORMAL LOW (ref 13.0–17.0)
MCH: 31.7 pg (ref 26.0–34.0)
MCHC: 31.7 g/dL (ref 30.0–36.0)
MCV: 100 fL (ref 80.0–100.0)
Platelets: 105 10*3/uL — ABNORMAL LOW (ref 150–400)
RBC: 2.68 MIL/uL — ABNORMAL LOW (ref 4.22–5.81)
RDW: 19.8 % — ABNORMAL HIGH (ref 11.5–15.5)
WBC: 7.6 10*3/uL (ref 4.0–10.5)
nRBC: 0.4 % — ABNORMAL HIGH (ref 0.0–0.2)

## 2018-12-17 LAB — HEMOGLOBIN AND HEMATOCRIT, BLOOD
HCT: 27.1 % — ABNORMAL LOW (ref 39.0–52.0)
Hemoglobin: 8.6 g/dL — ABNORMAL LOW (ref 13.0–17.0)

## 2018-12-17 SURGERY — COLONOSCOPY WITH PROPOFOL
Anesthesia: Monitor Anesthesia Care

## 2018-12-17 MED ORDER — SODIUM CHLORIDE 0.9 % IV SOLN: INTRAVENOUS | Status: DC

## 2018-12-17 MED ORDER — PEG 3350-KCL-NA BICARB-NACL 420 G PO SOLR
4000.0000 mL | Freq: Once | ORAL | Status: AC
  Administered 2018-12-17: 17:00:00 4000 mL via ORAL
  Filled 2018-12-17 (×2): qty 4000

## 2018-12-17 MED ORDER — PHENOL 1.4 % MT LIQD
1.0000 | OROMUCOSAL | Status: DC | PRN
  Filled 2018-12-17 (×2): qty 177

## 2018-12-17 MED ORDER — MENTHOL 3 MG MT LOZG
1.0000 | LOZENGE | OROMUCOSAL | Status: DC | PRN
  Administered 2018-12-17: 19:00:00 3 mg via ORAL
  Filled 2018-12-17 (×2): qty 9

## 2018-12-17 MED ORDER — PROPOFOL 10 MG/ML IV BOLUS
INTRAVENOUS | Status: AC
  Filled 2018-12-17: qty 20

## 2018-12-17 MED ORDER — LACTATED RINGERS IV SOLN
INTRAVENOUS | Status: DC
  Administered 2018-12-17: 12:00:00 1000 mL via INTRAVENOUS

## 2018-12-17 MED ORDER — PROPOFOL 10 MG/ML IV BOLUS
INTRAVENOUS | Status: DC | PRN
  Administered 2018-12-17: 20 mg via INTRAVENOUS

## 2018-12-17 MED ORDER — DEXTROSE-NACL 5-0.45 % IV SOLN
INTRAVENOUS | Status: DC
  Administered 2018-12-17 – 2018-12-18 (×2): via INTRAVENOUS

## 2018-12-17 MED ORDER — PROPOFOL 500 MG/50ML IV EMUL
INTRAVENOUS | Status: DC | PRN
  Administered 2018-12-17: 130 ug/kg/min via INTRAVENOUS

## 2018-12-17 MED ORDER — PROPOFOL 10 MG/ML IV BOLUS
INTRAVENOUS | Status: AC
  Filled 2018-12-17: qty 40

## 2018-12-17 SURGICAL SUPPLY — 22 items

## 2018-12-17 NOTE — Progress Notes (Signed)
Looks well. VSS. Denies abd pn.  Had bleeding through the night--difficult to tell how much of this was prep-induced dilution/passage of old blood in colon.  Hgb 8.5 at 0200 today, 8.6 at 1000 today which is reassuring.  Abd NT.  Will plan to proceed w/ colonoscopy later today as previously intended.  Cleotis Nipper, M.D. Pager [phone] If no answer or after 5 PM call [phone]

## 2018-12-17 NOTE — Anesthesia Preprocedure Evaluation (Signed)
Anesthesia Evaluation  Patient identified by MRN, date of birth, ID band Patient awake    Reviewed: Allergy & Precautions, NPO status , Patient's Chart, lab work & pertinent test results  Airway Mallampati: II  TM Distance: >3 FB Neck ROM: Full    Dental no notable dental hx.    Pulmonary neg pulmonary ROS,    Pulmonary exam normal breath sounds clear to auscultation       Cardiovascular hypertension, Pt. on medications Normal cardiovascular exam Rhythm:Regular Rate:Normal     Neuro/Psych negative neurological ROS  negative psych ROS   GI/Hepatic negative GI ROS, Neg liver ROS,   Endo/Other  negative endocrine ROS  Renal/GU negative Renal ROS  negative genitourinary   Musculoskeletal negative musculoskeletal ROS (+)   Abdominal   Peds negative pediatric ROS (+)  Hematology negative hematology ROS (+)   Anesthesia Other Findings   Reproductive/Obstetrics negative OB ROS                             Anesthesia Physical Anesthesia Plan  ASA: II  Anesthesia Plan: MAC   Post-op Pain Management:    Induction: Intravenous  PONV Risk Score and Plan: Treatment may vary due to age or medical condition  Airway Management Planned: Nasal Cannula and Simple Face Mask  Additional Equipment:   Intra-op Plan:   Post-operative Plan:   Informed Consent: I have reviewed the patients History and Physical, chart, labs and discussed the procedure including the risks, benefits and alternatives for the proposed anesthesia with the patient or authorized representative who has indicated his/her understanding and acceptance.     Dental advisory given  Plan Discussed with:   Anesthesia Plan Comments:         Anesthesia Quick Evaluation

## 2018-12-17 NOTE — Interval H&P Note (Signed)
History and Physical Interval Note:  12/17/2018 12:29 PM  James Gibbs  has presented today for surgery, with the diagnosis of hematochezia.  The various methods of treatment have been discussed with the patient and family. After consideration of risks, benefits and other options for treatment, the patient has consented to  Procedure(s): COLONOSCOPY WITH PROPOFOL (N/A) as a surgical intervention.  The patient's history has been reviewed, patient examined, no change in status, stable for surgery.  I have reviewed the patient's chart and labs.  Questions were answered to the patient's satisfaction.     Youlanda Mighty Delicia Berens

## 2018-12-17 NOTE — Transfer of Care (Signed)
Immediate Anesthesia Transfer of Care Note  Patient: James Gibbs  Procedure(s) Performed: COLONOSCOPY WITH PROPOFOL (N/A )  Patient Location: PACU and Endoscopy Unit  Anesthesia Type:MAC  Level of Consciousness: awake, alert , oriented and patient cooperative  Airway & Oxygen Therapy: Patient Spontanous Breathing and Patient connected to face mask oxygen  Post-op Assessment: Report given to RN, Post -op Vital signs reviewed and stable and Patient moving all extremities  Post vital signs: Reviewed and stable  Last Vitals:  Vitals Value Taken Time  BP 126/76 12/17/18 1335  Temp 36.2 C 12/17/18 1335  Pulse 100 12/17/18 1335  Resp 11 12/17/18 1335  SpO2 100 % 12/17/18 1335    Last Pain:  Vitals:   12/17/18 1335  TempSrc: Temporal  PainSc:          Complications: No apparent anesthesia complications

## 2018-12-17 NOTE — Progress Notes (Signed)
PROGRESS NOTE    James Gibbs  BJY:[id number] DOB: 13-May-1956 DOA: 12/13/2018 PCP: James Gibbs, James L, MD    Brief Narrative:  HPI per Dr. Nevin James Gibbs on 12/13/2018  James BumpersWilliam A Stewartis a 63 y.o.malewith medical history significant [id number] year old male with past medical history of hypertension, hyperlipidemia, mitral valve prolapse,DM II,presenting with questionable syncope presyncopal episode,noting blood in stool.  Patient Denies having: Fever, Chills, Cough, SOB, Chest Pain, Abd pain, N/V/D, headache, dizziness, lightheadedness, joint pain, rash, open wounds  ED Course: Mildly hypotensive, H&H down from baseline 2 units of packed red blood cell has been ordered pending transfusion GI JamesBuccinihas been consulted pending evaluation recommendations.  Patient to be admitted for GI evaluation, anemia due to GI bleed blood transfusion  Patient subsequently underwent upper endoscopy on 12/14/2018 with no source of bleeding.   Assessment & Plan:   Principal Problem:   GIB (gastrointestinal bleeding) Active Problems:   Syncope, vasovagal   MITRAL REGURGITATION   HTN (hypertension)   Hypotension   Gout   Acute blood loss anemia  1 vasovagal syncope Patient noted to have a vasovagal syncope during this hospitalization whenever he got up to sit at the bedside commode and was having bloody bowel movements.  Patient has been having vasovagal syncopal episodes since admission with ongoing bloody bowel movements and ongoing bleeding.  Repeat H&H with hemoglobin at 8.5 at 2:13 AM this morning.  Patient still with ongoing bleeding.  Patient has received 6 units of packed red blood cells during this hospitalization.  Repeat H&H.  Continue TED hose, IV fluids.  CT head negative for any acute abnormalities.  Continue to monitor in the ICU.  GI following.   2.  Acute GI bleed suspect diverticular bleed/acute blood loss anemia Patient admitted with syncopal/presyncopal episode  noted to be mildly hypotensive on admission with anemia.  Hemoglobin on admission was down to 7.6.  Patient transfused 2 units packed red blood cells.  Hemoglobin noted to be 6.8 the morning of 12/15/2018 and patient transfused another 2 units of packed red blood cells.  Patient with ongoing bloody bowel movements yesterday, and overnight.  Patient with large bloody bowel movement the morning of 12/16/2018, per RN with a vasovagal syncopal episode.   Patient seen by GI underwent upper endoscopy with no source of bleeding noted.  Patient with vasovagal syncopal episodes likely attributed to acute GI bleed.  Patient currently in the ICU unit due to ongoing GI bleed and vasovagal syncopal episodes.   Patient subsequently underwent a nuclear medicine GI bleeding scan on 12/15/2018 which showed a small area of increased activity left upper quadrant likely representing very slow hemorrhage within the splenic flexure.  Patient with repeat H&H of 8.5 early this morning around 2:13 AM.  Patient was transfused 2 units packed red blood cells on 12/16/2018 and has received a total of 6 units packed red blood cells during this hospitalization.  Patient noted to have ongoing bloody bowel movements overnight and this morning per RN.  Repeat stat H&H.  Patient being followed by GI and patient for colonoscopy today for further evaluation and management. Due to patient's ongoing bleeding, vasovagal syncope, now up to 6 units of packed red blood cells, general surgery was consulted for further evaluation and management.  GI following.    3.  Mitral regurgitation Stable.  Monitor closely for volume overload as patient with ongoing bleeding requiring transfusion of packed red blood cells.  4.  Mild hypotension Felt secondary to volume depletion in the setting  of GI bleed.  Patient's antihypertensive medications of HCTZ and lisinopril on hold.  Norvasc has been discontinued.  Hypotension improved.   5.  Hyperlipidemia Continue  statin.  6.  Type 2 diabetes mellitus Oral hypoglycemic agents on hold.  CBG of 104 this morning.  Continue SSI.  7.  Gout Stable.  Continue allopurinol.  8.  Hypokalemia K. Dur 40 mEq p.o. x1.  Potassium at 3.8.  Follow.   DVT prophylaxis: SCDs Code Status: Full Family Communication: updated patient. Disposition Plan: Remain in ICU.  Patient with ongoing bleeding with vasovagal syncope and unsafe to be discharged at this time and requiring inpatient treatment and management.    Consultants:   Gastroenterology: Dr. Matthias HughsBuccini 12/13/2018  General surgery: Dr. Donell BeersByerly 12/16/2018  Procedures:   Chest x-ray 12/13/2018  Tagged red blood cell scan 12/15/2018  Upper endoscopy 12/14/2018  2 units transfusion packed red blood cells 12/13/2018  2 units transfusion packed red blood cells 12/15/2018--  2 units transfusion packed red blood cells 12/16/2018  Antimicrobials:   None   Subjective: Patient noted per RN report given that patient still with bloody bowel movements overnight.  Patient has not been gotten up to the bedside commode due to concerns for vasovagal syncope and as such no further syncopal episodes noted.  Patient denies any chest pain or shortness of breath.  No abdominal pain.    Objective: Vitals:   12/17/18 0417 12/17/18 0500 12/17/18 0600 12/17/18 0800  BP:  (!) 147/70 (!) 163/81   Pulse:  77 65   Resp:  18 15   Temp: 97.8 F (36.6 C)   97.6 F (36.4 C)  TempSrc: Oral   Oral  SpO2:  98% 99%   Weight:      Height:        Intake/Output Summary (Last 24 hours) at 12/17/2018 0944 Last data filed at 12/17/2018 0413 Gross per 24 hour  Intake 1922.38 ml  Output 701 ml  Net 1221.38 ml   Filed Weights   12/14/18 1329 12/16/18 0500  Weight: 95.7 kg 95 kg    Examination:  General exam: Alert. Respiratory system: CTAB.  No wheezes, no crackles, no rhonchi.  Normal respiratory effort.  Speaking in full sentences.  Cardiovascular system: RRR no murmurs rubs  or gallops.  No JVD.  No lower extremity edema.  Gastrointestinal system: Abdomen is nontender, nondistended, soft, positive bowel sounds.  No rebound.  No guarding.  Central nervous system: Alert.  Moving extremities spontaneously.  Extremities: Symmetric 5 x 5 power. Skin: No rashes, lesions or ulcers Psychiatry: Judgement and insight appear normal. Mood & affect appropriate.     Data Reviewed: I have personally reviewed following labs and imaging studies  CBC: Recent Labs  Lab 12/13/18 1131  12/14/18 0141  12/15/18 0145  12/15/18 2044 12/16/18 0209 12/16/18 0855 12/16/18 1641 12/17/18 0213  WBC 9.2  --  7.2  --  10.3  --   --  8.9  --   --  7.6  NEUTROABS 6.6  --   --   --  8.0*  --   --   --   --   --   --   HGB 7.6*   < > 8.3*   < > 7.7*   < > 8.0* 7.1* 6.6* 8.1* 8.5*  HCT 23.2*   < > 26.1*   < > 23.9*   < > 25.8* 22.1* 20.6* 25.0* 26.8*  MCV 101.8*  --  99.2  --  102.1*  --   --  103.8*  --   --  100.0  PLT 192  --  140*  --  142*  --   --  118*  --   --  105*   < > = values in this interval not displayed.   Basic Metabolic Panel: Recent Labs  Lab 12/13/18 1131 12/13/18 2130 12/14/18 0741 12/15/18 0145 12/16/18 0209 12/17/18 0213  NA 140  --  143 142 146* 147*  K 3.4*  --  3.4* 3.5 3.5 3.8  CL 105  --  109 110 117* 117*  CO2 25  --  29 21* 23 21*  GLUCOSE 189*  --  102* 137* 159* 120*  BUN 23  --  18 22 31* 24*  CREATININE 1.11  --  0.93 0.97 1.06 1.03  CALCIUM 8.0* 7.8* 7.9* 7.7* 7.5* 7.6*  MG  --  2.0  --  1.8  --   --   PHOS  --  3.2  --  2.5  --   --    GFR: Estimated Creatinine Clearance: 85.3 mL/min (by C-G formula based on SCr of 1.03 mg/dL). Liver Function Tests: Recent Labs  Lab 12/13/18 1131 12/15/18 0145  AST 25 23  ALT 22 28  ALKPHOS 47 40  BILITOT 0.3 0.6  PROT 5.2* 4.7*  ALBUMIN 3.0* 2.9*   Recent Labs  Lab 12/13/18 1131  LIPASE 25   No results for input(s): AMMONIA in the last 168 hours. Coagulation Profile: Recent Labs  Lab  12/13/18 1131 12/14/18 0141  INR 1.1 1.1   Cardiac Enzymes: No results for input(s): CKTOTAL, CKMB, CKMBINDEX, TROPONINI in the last 168 hours. BNP (last 3 results) No results for input(s): PROBNP in the last 8760 hours. HbA1C: No results for input(s): HGBA1C in the last 72 hours. CBG: Recent Labs  Lab 12/16/18 1628 12/16/18 1938 12/16/18 2336 12/17/18 0406 12/17/18 0749  GLUCAP 92 103* 106* 104* 106*   Lipid Profile: No results for input(s): CHOL, HDL, LDLCALC, TRIG, CHOLHDL, LDLDIRECT in the last 72 hours. Thyroid Function Tests: No results for input(s): TSH, T4TOTAL, FREET4, T3FREE, THYROIDAB in the last 72 hours. Anemia Panel: No results for input(s): VITAMINB12, FOLATE, FERRITIN, TIBC, IRON, RETICCTPCT in the last 72 hours. Sepsis Labs: No results for input(s): PROCALCITON, LATICACIDVEN in the last 168 hours.  Recent Results (from the past 240 hour(s))  Urine culture     Status: None   Collection Time: 12/13/18 11:43 AM   Specimen: Urine, Clean Catch  Result Value Ref Range Status   Specimen Description   Final    URINE, CLEAN CATCH Performed at [facility], [address], [place], [place] [address]    Special Requests   Final    Normal Performed at [facility], [address] [place], [place] [address]    Culture   Final    NO GROWTH Performed at [facility], [address] [place], [place] [address]    Report Status 12/14/2018 FINAL  Final  SARS Coronavirus 2 (CEPHEID - Performed in [facility] hospital lab), Hosp Order     Status: None   Collection Time: 12/13/18 12:39 PM   Specimen: Nasopharyngeal Swab  Result Value Ref Range Status   SARS Coronavirus 2 NEGATIVE NEGATIVE Final    Comment: (NOTE) If result is NEGATIVE SARS-CoV-2 target nucleic acids are NOT DETECTED. The SARS-CoV-2 RNA is generally detectable in upper and lower  respiratory specimens during the acute phase of infection. The lowest  concentration of SARS-CoV-2 viral copies this assay can detect is 250  copies / mL. A negative result does not preclude SARS-CoV-2 infection  and should not be used as the sole basis for treatment or other  patient management decisions.  A negative result may occur with  improper specimen collection / handling, submission of specimen other  than nasopharyngeal swab, presence of viral mutation(s) within the  areas targeted by this assay, and inadequate number of viral copies  (<250 copies / mL). A negative result must be combined with clinical  observations, patient history, and epidemiological information. If result is POSITIVE SARS-CoV-2 target nucleic acids are DETECTED. The SARS-CoV-2 RNA is generally detectable in upper and lower  respiratory specimens dur ing the acute phase of infection.  Positive  results are indicative of active infection with SARS-CoV-2.  Clinical  correlation with patient history and other diagnostic information is  necessary to determine patient infection status.  Positive results do  not rule out bacterial infection or co-infection with other viruses. If result is PRESUMPTIVE POSTIVE SARS-CoV-2 nucleic acids MAY BE PRESENT.   A presumptive positive result was obtained on the submitted specimen  and confirmed on repeat testing.  While 2019 novel coronavirus  (SARS-CoV-2) nucleic acids may be present in the submitted sample  additional confirmatory testing may be necessary for epidemiological  and / or clinical management purposes  to differentiate between  SARS-CoV-2 and other Sarbecovirus currently known to infect humans.  If clinically indicated additional testing with an alternate test  methodology [phone]) is advised. The SARS-CoV-2 RNA is generally  detectable in upper and lower respiratory sp ecimens during the acute  phase of infection. The expected result is Negative. Fact Sheet for Patients:  BoilerBrush.com.cy Fact Sheet  for Healthcare Providers: https://pope.com/ This test is not yet approved or cleared by the [place] FDA and has been authorized for detection and/or diagnosis of SARS-CoV-2 by FDA under an Emergency Use Authorization (EUA).  This EUA will remain in effect (meaning this test can be used) for the duration of the COVID-19 declaration under Section 564(b)(1) of the Act, 21 U.S.C. section 360bbb-3(b)(1), unless the authorization is terminated or revoked sooner. Performed at [facility], [address], [place], [place] [address]   MRSA PCR Screening     Status: None   Collection Time: 12/15/18 12:13 PM   Specimen: Nasal Mucosa; Nasopharyngeal  Result Value Ref Range Status   MRSA by PCR NEGATIVE NEGATIVE Final    Comment:        The GeneXpert MRSA Assay (FDA approved for NASAL specimens only), is one component of a comprehensive MRSA colonization surveillance program. It is not intended to diagnose MRSA infection nor to guide or monitor treatment for MRSA infections. Performed at [facility], [address], [place], [place] [address]          Radiology Studies: Ct Head Wo Contrast  Result Date: 12/16/2018 CLINICAL DATA:  63 year old male with past medical history of hypertension, hyperlipidemia, mitral valve prolapse,DM II, presenting with questionable syncope presyncopal episode, noting blood in stool." EXAM: CT HEAD WITHOUT CONTRAST TECHNIQUE: Contiguous axial images were obtained from the base of the skull through the vertex without intravenous contrast. COMPARISON:  None. FINDINGS: Brain: No evidence of acute infarction, hemorrhage, hydrocephalus, extra-axial collection or mass lesion/mass effect. There is ventricular and sulcal enlargement reflecting mild to moderate generalized atrophy, advanced for age. Patchy areas of white matter hypoattenuation consistent with moderate chronic microvascular ischemic  change.  There are small old basal ganglia and deep white matter lacunar infarcts. Vascular: No hyperdense vessel or unexpected calcification. Skull: Normal. Negative for fracture or focal lesion. Sinuses/Orbits: Globes and orbits are unremarkable. The visualized sinuses and mastoid air cells are clear. Other: None. IMPRESSION: 1. No acute intracranial abnormalities. 2. Atrophy advanced for age. 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 Electronically Signed   By: Lajean Manes M.D.   On: 12/16/2018 09:17   Nm Gi Blood Loss  Result Date: 12/15/2018 CLINICAL DATA:  Diverticular hemorrhage, last active hemorrhage was 1 day ago EXAM: NUCLEAR MEDICINE GASTROINTESTINAL BLEEDING SCAN TECHNIQUE: Sequential abdominal images were obtained following intravenous administration of Tc-78m labeled red blood cells. RADIOPHARMACEUTICALS:  22.9 mCi Tc-47m pertechnetate in-vitro labeled red cells. COMPARISON:  None. FINDINGS: There is adequate uptake throughout the liver and cardiac blood pool and vasculature. Pooling in the urinary bladder with some bony uptake is noted. The first 60 minutes of imaging were significantly limited due to patient motion artifact. Imaging to 2 hours was then performed and demonstrates a small focal area of increased activity in the left upper quadrant below the location of the spleen. This stays in the left upper quadrant without significant mobility. This may represent a very slow diverticular hemorrhage in the region of the splenic flexure. No other focal area of increased uptake is noted to suggest active hemorrhage. IMPRESSION:.: IMPRESSION:. Small area of increased activity in the left upper quadrant likely representing a very slow hemorrhage within the splenic flexure. Direct visualization may be helpful Electronically Signed   By: Inez Catalina M.D.   On: 12/15/2018 19:09        Scheduled Meds:  sodium chloride    Intravenous Once   sodium chloride   Intravenous Once   allopurinol  300 mg Oral Daily   atorvastatin  80 mg Oral Daily   Chlorhexidine Gluconate Cloth  6 each Topical Daily   docusate sodium  100 mg Oral BID   insulin aspart  0-9 Units Subcutaneous Q4H   mirtazapine  15 mg Oral QHS   sodium chloride flush  3 mL Intravenous Q12H   Continuous Infusions:  sodium chloride     dextrose 5 % and 0.45% NaCl 75 mL/hr at 12/17/18 0855   sodium chloride       LOS: 4 days    Time spent: 40 minutes    Irine Seal, MD [facility]  If 7PM-7AM, please contact night-coverage [email or web address] 12/17/2018, 9:44 AM

## 2018-12-17 NOTE — Op Note (Signed)
[facility] Patient Name: James ShihWilliam Gibbs Procedure Date: 12/17/2018 MRN: [id number] Attending MD: Bernette Redbirdobert Jacolyn Joaquin , MD Date of Birth: 24-Jan-1956 CSN: [id number] Age: 63 Admit Type: Inpatient Procedure:                Colonoscopy Indications:              Last colonoscopy 5 years ago, Hematochezia Providers:                Bernette Redbirdobert Makaiya Geerdes, MD, Dwain SarnaPatricia Ford, RN, Harrington ChallengerHope Parker,                            Technician Referring MD:              Medicines:                Monitored Anesthesia Care Complications:            No immediate complications. Estimated Blood Loss:     Estimated blood loss: none. Procedure:                Pre-Anesthesia Assessment:                           - Prior to the procedure, a History and Physical                            was performed, and patient medications and                            allergies were reviewed. The patient's tolerance of                            previous anesthesia was also reviewed. The risks                            and benefits of the procedure and the sedation                            options and risks were discussed with the patient.                            All questions were answered, and informed consent                            was obtained. Prior Anticoagulants: The patient has                            taken no previous anticoagulant or antiplatelet                            agents except for aspirin. ASA Grade Assessment: II                            - A patient with mild systemic disease. After  reviewing the risks and benefits, the patient was                            deemed in satisfactory condition to undergo the                            procedure.                           After obtaining informed consent, the colonoscope                            was passed under direct vision. Throughout the                            procedure, the patient's blood pressure,  pulse, and                            oxygen saturations were monitored continuously. The                            CF-HQ190L ([id number]) Olympus colonoscope was                            introduced through the anus and advanced to the the                            terminal ileum. The colonoscopy was performed                            without difficulty. The patient tolerated the                            procedure well. The quality of the bowel                            preparation was fair. Puddles of thick dark fluid                            and a coating of similar material on the mucosa                            might have obscured small or focal abnormalities. Scope In: 1:02:17 PM Scope Out: 1:27:15 PM Scope Withdrawal Time: 0 hours 18 minutes 12 seconds  Total Procedure Duration: 0 hours 24 minutes 58 seconds  Findings:      The perianal and digital rectal examinations were normal. Pertinent       negatives include normal prostate (size, shape, and consistency).      Hematin (altered blood/coffee-ground-like material) and dark "motor oil"       fluid was found in the entire colon, slightly blood-tinged       (serosanguinous) during suction. No active bleeding, fresh red blood, or       clots were noted. No adherent clots present.      No other significant abnormalities  were identified in a careful       examination of the remainder of the colon, although as mentioned above,       the prep was sub-optimal so small lesions might have been missed.      In particular, careful examination and re-examination of the sigmoid       region did not disclose any evident diverticular disease, in contrast to       his 09/2007 CT scan which called "scattered sigmoid diverticula."      The terminal ileum was entered and contained similar hematin (altered       blood/coffee-ground-like material).      Retroflexion not performed in rectum but antegrade viewing disclosed no        abnormalities.      No significant internal hemorroids were noted during pullout through the       anal canal. Impression:               - Old blood in the entire examined colon.                           - Old blood in the terminal ileum.                           - No specimens collected.                           - NO DIVERTICULOSIS SEEN.                           --Based on the above findings, my current                            impression is that the patient's bleeding source                            (1) is currently quiescent and (2) is proximal to                            the ileo-cecal valve. Moderate Sedation:      This patient was sedated with monitored anesthesia care, not moderate       sedation. Recommendation:           - To visualize the small bowel, perform video                            capsule endoscopy tomorrow. Procedure Code(s):        --- Professional ---                           [phone], Colonoscopy, flexible; diagnostic, including                            collection of specimen(s) by brushing or washing,                            when performed (separate procedure) Diagnosis Code(s):        --- Professional ---  K92.2, Gastrointestinal hemorrhage, unspecified CPT copyright 2019 American Medical Association. All rights reserved. The codes documented in this report are preliminary and upon coder review may  be revised to meet current compliance requirements. Bernette Redbirdobert Mykenzie Ebanks, MD 12/17/2018 1:45:17 PM This report has been signed electronically. Number of Addenda: 0

## 2018-12-17 NOTE — Progress Notes (Signed)
PT Cancellation Note  Patient Details Name: James Gibbs MRN: [id number] DOB: June 17, 1955   Cancelled Treatment:    Reason Eval/Treat Not Completed: Patient at procedure or test/unavailable   [name], PT Acute Rehabilitation Services Pager [phone]  Office [phone]  [name] 12/17/2018, 1:05 PM

## 2018-12-17 NOTE — Progress Notes (Signed)
OT Cancellation Note  Patient Details Name: TRESHON STANNARD MRN: [id number] DOB: 04/12/1956   Cancelled Treatment:    Reason Eval/Treat Not Completed: Patient at procedure or test/ unavailable  Shaquetta Arcos 12/17/2018, 11:59 AM  Lesle Chris, OTR/L Acute Rehabilitation Services [phone] [facility] pager [phone] office 12/17/2018

## 2018-12-17 NOTE — Progress Notes (Signed)
Colonoscopy well-tolerated.  Please see dictated procedure report.  My current impression is that the patient has had recent bleeding from proximal to the ileocecal valve because of no evidence of colonic diverticula and old blood throughout the colon.  Accordingly, I have arranged for a capsule endoscopy tomorrow.  Dr. Paulita Fujita will be covering the service for [place] this weekend.  Cleotis Nipper, M.D. Pager [phone] If no answer or after 5 PM call [phone]

## 2018-12-17 NOTE — Anesthesia Postprocedure Evaluation (Signed)
Anesthesia Post Note  Patient: James Gibbs  Procedure(s) Performed: COLONOSCOPY WITH PROPOFOL (N/A )     Patient location during evaluation: Endoscopy Anesthesia Type: MAC Level of consciousness: awake and alert Pain management: pain level controlled Vital Signs Assessment: post-procedure vital signs reviewed and stable Respiratory status: spontaneous breathing, nonlabored ventilation, respiratory function stable and patient connected to nasal cannula oxygen Cardiovascular status: stable and blood pressure returned to baseline Postop Assessment: no apparent nausea or vomiting Anesthetic complications: no    Last Vitals:  Vitals:   12/17/18 1350 12/17/18 1400  BP: (!) 147/87 (!) 157/75  Pulse: 76 76  Resp: (!) 26 19  Temp:    SpO2: 100% 100%    Last Pain:  Vitals:   12/17/18 1335  TempSrc: Temporal  PainSc:                  [name]

## 2018-12-18 ENCOUNTER — Encounter (HOSPITAL_COMMUNITY)
Admission: EM | Disposition: A | Discharge status: Home Health | Payer: Self-pay | Source: Home / Self Care | Attending: Internal Medicine

## 2018-12-18 ENCOUNTER — Encounter (HOSPITAL_COMMUNITY): Payer: Self-pay | Admitting: Certified Registered"

## 2018-12-18 HISTORY — PX: GIVENS CAPSULE STUDY: SHX5432

## 2018-12-18 LAB — HEMOGLOBIN AND HEMATOCRIT, BLOOD
HCT: 22 % — ABNORMAL LOW (ref 39.0–52.0)
Hemoglobin: 7.1 g/dL — ABNORMAL LOW (ref 13.0–17.0)

## 2018-12-18 LAB — GLUCOSE, CAPILLARY
Glucose-Capillary: 102 mg/dL — ABNORMAL HIGH (ref 70–99)
Glucose-Capillary: 107 mg/dL — ABNORMAL HIGH (ref 70–99)
Glucose-Capillary: 108 mg/dL — ABNORMAL HIGH (ref 70–99)
Glucose-Capillary: 108 mg/dL — ABNORMAL HIGH (ref 70–99)
Glucose-Capillary: 108 mg/dL — ABNORMAL HIGH (ref 70–99)
Glucose-Capillary: 112 mg/dL — ABNORMAL HIGH (ref 70–99)
Glucose-Capillary: 115 mg/dL — ABNORMAL HIGH (ref 70–99)
Glucose-Capillary: 84 mg/dL (ref 70–99)
Glucose-Capillary: 86 mg/dL (ref 70–99)

## 2018-12-18 LAB — BASIC METABOLIC PANEL
Anion gap: 6 (ref 5–15)
BUN: 7 mg/dL — ABNORMAL LOW (ref 8–23)
CO2: 25 mmol/L (ref 22–32)
Calcium: 7.8 mg/dL — ABNORMAL LOW (ref 8.9–10.3)
Chloride: 113 mmol/L — ABNORMAL HIGH (ref 98–111)
Creatinine, Ser: 0.95 mg/dL (ref 0.61–1.24)
GFR calc Af Amer: >60 mL/min (ref >60)
GFR calc non Af Amer: >60 mL/min (ref >60)
Glucose, Bld: 162 mg/dL — ABNORMAL HIGH (ref 70–99)
Potassium: 3.3 mmol/L — ABNORMAL LOW (ref 3.5–5.1)
Sodium: 144 mmol/L (ref 135–145)

## 2018-12-18 LAB — CBC
HCT: 24.2 % — ABNORMAL LOW (ref 39.0–52.0)
Hemoglobin: 7.3 g/dL — ABNORMAL LOW (ref 13.0–17.0)
MCH: 30.4 pg (ref 26.0–34.0)
MCHC: 30.2 g/dL (ref 30.0–36.0)
MCV: 100.8 fL — ABNORMAL HIGH (ref 80.0–100.0)
Platelets: 102 10*3/uL — ABNORMAL LOW (ref 150–400)
RBC: 2.4 MIL/uL — ABNORMAL LOW (ref 4.22–5.81)
RDW: 19.5 % — ABNORMAL HIGH (ref 11.5–15.5)
WBC: 7.6 10*3/uL (ref 4.0–10.5)
nRBC: 0 % (ref 0.0–0.2)

## 2018-12-18 LAB — PREPARE RBC (CROSSMATCH)

## 2018-12-18 SURGERY — IMAGING PROCEDURE, GI TRACT, INTRALUMINAL, VIA CAPSULE

## 2018-12-18 MED ORDER — FUROSEMIDE 10 MG/ML IJ SOLN
20.0000 mg | Freq: Once | INTRAMUSCULAR | Status: AC
  Administered 2018-12-18: 21:00:00 20 mg via INTRAVENOUS
  Filled 2018-12-18: qty 2

## 2018-12-18 MED ORDER — POTASSIUM CHLORIDE CRYS ER 20 MEQ PO TBCR
40.0000 meq | EXTENDED_RELEASE_TABLET | Freq: Once | ORAL | Status: AC
  Administered 2018-12-18: 08:00:00 40 meq via ORAL
  Filled 2018-12-18: qty 2

## 2018-12-18 MED ORDER — FUROSEMIDE 10 MG/ML IJ SOLN
20.0000 mg | Freq: Once | INTRAMUSCULAR | Status: AC
  Administered 2018-12-18: 16:00:00 20 mg via INTRAVENOUS
  Filled 2018-12-18: qty 2

## 2018-12-18 MED ORDER — DIPHENHYDRAMINE HCL 50 MG/ML IJ SOLN
12.5000 mg | Freq: Once | INTRAMUSCULAR | Status: AC
  Administered 2018-12-18: 12.5 mg via INTRAVENOUS
  Filled 2018-12-18: qty 1

## 2018-12-18 MED ORDER — SODIUM CHLORIDE 0.9% IV SOLUTION
Freq: Once | INTRAVENOUS | Status: AC
  Administered 2018-12-18: 13:00:00 via INTRAVENOUS

## 2018-12-18 MED ORDER — ACETAMINOPHEN 325 MG PO TABS: 650.0000 mg | ORAL_TABLET | Freq: Once | ORAL | Status: DC

## 2018-12-18 MED ORDER — DIPHENHYDRAMINE HCL 25 MG PO CAPS: 25.0000 mg | ORAL_CAPSULE | Freq: Once | ORAL | Status: DC

## 2018-12-18 MED ORDER — ACETAMINOPHEN 10 MG/ML IV SOLN
1000.0000 mg | Freq: Once | INTRAVENOUS | Status: AC
  Administered 2018-12-18: 13:00:00 1000 mg via INTRAVENOUS
  Filled 2018-12-18: qty 100

## 2018-12-18 NOTE — Progress Notes (Signed)
Subjective: No blood in stool today per nursing. Had some blood in stool yesterday with bowel prep.  Objective: Vital signs in last 24 hours: Temp:  [97.1 F (36.2 C)-99.1 F (37.3 C)] 98.5 F (36.9 C) (07/25 0800) Pulse Rate:  [64-100] 74 (07/25 0800) Resp:  [11-26] 17 (07/25 0800) BP: (126-174)/(55-121) 151/67 (07/25 0800) SpO2:  [97 %-100 %] 100 % (07/25 0800) Weight:  [95 kg] 95 kg (07/25 1048) Weight change:  Last BM Date: 12/18/18  PE: GEN:  NAD ABD:  Soft, non-tender  Lab Results: CBC    Component Value Date/Time   WBC 7.6 12/18/2018 0224   RBC 2.40 (L) 12/18/2018 0224   HGB 7.1 (L) 12/18/2018 1035   HCT 22.0 (L) 12/18/2018 1035   PLT 102 (L) 12/18/2018 0224   MCV 100.8 (H) 12/18/2018 0224   MCV 101.7 (A) 05/10/2013 2025   MCH 30.4 12/18/2018 0224   MCHC 30.2 12/18/2018 0224   RDW 19.5 (H) 12/18/2018 0224   LYMPHSABS 1.5 12/15/2018 0145   MONOABS 0.7 12/15/2018 0145   EOSABS 0.1 12/15/2018 0145   BASOSABS 0.0 12/15/2018 0145   CMP     Component Value Date/Time   NA 144 12/18/2018 0224   K 3.3 (L) 12/18/2018 0224   CL 113 (H) 12/18/2018 0224   CO2 25 12/18/2018 0224   GLUCOSE 162 (H) 12/18/2018 0224   BUN 7 (L) 12/18/2018 0224   CREATININE 0.95 12/18/2018 0224   CALCIUM 7.8 (L) 12/18/2018 0224   PROT 4.7 (L) 12/15/2018 0145   ALBUMIN 2.9 (L) 12/15/2018 0145   AST 23 12/15/2018 0145   ALT 28 12/15/2018 0145   ALKPHOS 40 12/15/2018 0145   BILITOT 0.6 12/15/2018 0145   GFRNONAA >60 12/18/2018 0224   GFRAA >60 12/18/2018 0224   Assessment:  1.  Overt obscure GI bleeding.  Patient has had bleeding scan, endoscopy, colonoscopy.  Initially thought perhaps LUQ bleeding site in colon, but no clear bleeding site in colon seen on colonoscopy.  Possibly small bowel source. 2.  Anemia, acute blood loss.  Plan:  1.  Capsule swallowed around 11 am this morning. 2.  Would continue supportive care with IVF hydration, blood transfusion as needed. 3.  Capsule  results likely not available until Monday. 4.  [facility] will revisit Monday.   James Gibbs, James Gibbs 12/18/2018, 11:54 AM   Cell [phone] If no answer or after 5 PM call [phone]

## 2018-12-18 NOTE — Progress Notes (Signed)
1 Day Post-Op   Subjective/Chief Complaint: Colonoscopy yesterday showed old blood all the way in TI.  No diverticular bleeds seen in colon.     Objective: Vital signs in last 24 hours: Temp:  [97.1 F (36.2 C)-99.1 F (37.3 C)] 98.4 F (36.9 C) (07/25 0300) Pulse Rate:  [64-100] 75 (07/25 0600) Resp:  [11-26] 18 (07/24 1800) BP: (126-174)/(55-121) 140/63 (07/25 0600) SpO2:  [97 %-100 %] 100 % (07/25 0600) Weight:  [95 kg] 95 kg (07/24 1159) Last BM Date: 12/17/18  Intake/Output from previous day: 07/24 0701 - 07/25 0700 In: 2208.3 [P.O.:400; I.V.:1808.3] Out: 3200 [Urine:2550; Stool:650] Intake/Output this shift: No intake/output data recorded.  General appearance: alert, cooperative and no distress Resp: breathing comfortably, normal rate, no distress Cardio: regular rate and rhythm GI: soft, non distended, non tender.  no masses appreciated. Extremities: extremities normal, atraumatic, no cyanosis or edema  Lab Results:  Recent Labs    12/17/18 0213 12/17/18 0958 12/18/18 0224  WBC 7.6  --  7.6  HGB 8.5* 8.6* 7.3*  HCT 26.8* 27.1* 24.2*  PLT 105*  --  102*   BMET Recent Labs    12/17/18 0213 12/18/18 0224  NA 147* 144  K 3.8 3.3*  CL 117* 113*  CO2 21* 25  GLUCOSE 120* 162*  BUN 24* 7*  CREATININE 1.03 0.95  CALCIUM 7.6* 7.8*   PT/INR No results for input(s): LABPROT, INR in the last 72 hours. ABG No results for input(s): PHART, HCO3 in the last 72 hours.  Invalid input(s): PCO2, PO2  Studies/Results: Ct Head Wo Contrast  Result Date: 12/16/2018 CLINICAL DATA:  63 year old male with past medical history of hypertension, hyperlipidemia, mitral valve prolapse,DM II, presenting with questionable syncope presyncopal episode, noting blood in stool." EXAM: CT HEAD WITHOUT CONTRAST TECHNIQUE: Contiguous axial images were obtained from the base of the skull through the vertex without intravenous contrast. COMPARISON:  None. FINDINGS: Brain: No evidence of  acute infarction, hemorrhage, hydrocephalus, extra-axial collection or mass lesion/mass effect. There is ventricular and sulcal enlargement reflecting mild to moderate generalized atrophy, advanced for age. Patchy areas of white matter hypoattenuation consistent with moderate chronic microvascular ischemic change. There are small old basal ganglia and deep white matter lacunar infarcts. Vascular: No hyperdense vessel or unexpected calcification. Skull: Normal. Negative for fracture or focal lesion. Sinuses/Orbits: Globes and orbits are unremarkable. The visualized sinuses and mastoid air cells are clear. Other: None. IMPRESSION: 1. No acute intracranial abnormalities. 2. Atrophy advanced for age. 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 Electronically Signed   By: Lajean Manes M.D.   On: 12/16/2018 09:17    Anti-infectives: Anti-infectives (From admission, onward)   None      Assessment/Plan: s/p Procedure(s): COLONOSCOPY WITH PROPOFOL (N/A)  GI bleed ABL anemia  Neg EGD, colonoscopy neg for colonic source, bleeding scan with source in LUQ.    Capsule study planned.   Surgery available, but no current site pinpointed.  No plans for surgical intervention at this time.  Will follow peripherally.     LOS: 5 days    Stark Klein 12/18/2018

## 2018-12-18 NOTE — Progress Notes (Signed)
PROGRESS NOTE    JAMARD HARPIN  IPP:[id number] DOB: 30-May-1955 DOA: 12/13/2018 PCP: Lavell Islam, MD    Brief Narrative:  HPI per Dr. [name] on 12/13/2018  [name] Stewartis a 63 y.o.malewith medical history significant of79 year old male with past medical history of hypertension, hyperlipidemia, mitral valve prolapse,DM II,presenting with questionable syncope presyncopal episode,noting blood in stool.  Patient Denies having: Fever, Chills, Cough, SOB, Chest Pain, Abd pain, N/V/D, headache, dizziness, lightheadedness, joint pain, rash, open wounds  ED Course: Mildly hypotensive, H&H down from baseline 2 units of packed red blood cell has been ordered pending transfusion GI Dr.Buccinihas been consulted pending evaluation recommendations.  Patient to be admitted for GI evaluation, anemia due to GI bleed blood transfusion  Patient subsequently underwent upper endoscopy on 12/14/2018 with no source of bleeding.   Assessment & Plan:   Principal Problem:   GIB (gastrointestinal bleeding) Active Problems:   Syncope, vasovagal   MITRAL REGURGITATION   HTN (hypertension)   Hypotension   Gout   Acute blood loss anemia  1 vasovagal syncope Patient noted to have a vasovagal syncope during this hospitalization whenever he got up to sit at the bedside commode and was having bloody bowel movements.  Patient has been having vasovagal syncopal episodes since admission with ongoing bloody bowel movements and ongoing bleeding.  Repeat H&H with hemoglobin at 7.3 at 2:13 AM this morning.  Patient still with ongoing bleeding.  Patient has received 6 units of packed red blood cells during this hospitalization.  Repeat H&H.  Transfuse 2 more units of packed red blood cells.  Continue TED hose, IV fluids.  CT head negative for any acute abnormalities.  Continue to monitor in the ICU.  GI following.   2.  Acute GI bleed suspect diverticular bleed/acute blood loss anemia  Patient admitted with syncopal/presyncopal episode noted to be mildly hypotensive on admission with anemia.  Hemoglobin on admission was down to 7.6.  Patient transfused 2 units packed red blood cells.  Hemoglobin noted to be 6.8 the morning of 12/15/2018 and patient transfused another 2 units of packed red blood cells.  Patient with ongoing bloody bowel movements. Patient with large bloody bowel movement the morning of 12/16/2018, per RN with a vasovagal syncopal episode.   Patient seen by GI underwent upper endoscopy with no source of bleeding noted.  Patient with vasovagal syncopal episodes likely attributed to acute GI bleed.  Patient currently in the ICU unit due to ongoing GI bleed and vasovagal syncopal episodes.   Patient subsequently underwent a nuclear medicine GI bleeding scan on 12/15/2018 which showed a small area of increased activity left upper quadrant likely representing very slow hemorrhage within the splenic flexure.  Patient with repeat H&H of 8.5 early the morning around 2:13 AM.  Patient was transfused 2 units packed red blood cells on 12/16/2018 and has received a total of 6 units packed red blood cells during this hospitalization.  Patient noted to have ongoing bloody bowel movements overnight and this morning per RN. Patient being followed by GI and patient underwent colonoscopy on 12/17/2018 which showed no evidence of colonic diverticula and old blood was noted throughout the colon concern for bleeding proximal to ileocecal valve per GI.  Patient with bloody bowel movements overnight and this morning.  Hemoglobin was 7.3 at 2:24 AM.  Patient noted to have large bloody bowel movement this morning as well.  Repeat H&H.  We will transfuse 2 units packed red blood cells.  Transfusion goal hemoglobin  less than 8.  Patient for capsule endoscopy today per GI.  Due to patient's ongoing bleeding, vasovagal syncope, now up to 8 units of packed red blood cells, general surgery was consulted for further  evaluation and management.  General surgery following.  Per GI.   3.  Mitral regurgitation Stable.  Monitor closely for volume overload as patient with ongoing bleeding requiring transfusion of packed red blood cells.  4.  Mild hypotension Felt secondary to volume depletion in the setting of GI bleed.  Patient's antihypertensive medications of HCTZ and lisinopril on hold.  Norvasc has been discontinued.  Hypotension improved.   5.  Hyperlipidemia Continue statin.  6.  Type 2 diabetes mellitus Oral hypoglycemic agents on hold.  CBG of 84 this morning.  Continue SSI.  7.  Gout Stable.  Continue allopurinol.  8.  Hypokalemia K. Dur 40 mEq p.o. x1.  Potassium at 3.3.  Follow.   DVT prophylaxis: SCDs Code Status: Full Family Communication: updated patient. Disposition Plan: Remain in ICU.  Patient with ongoing bleeding with vasovagal syncope and unsafe to be discharged at this time and requiring inpatient treatment and management.    Consultants:   Gastroenterology: Dr. Matthias HughsBuccini 12/13/2018  General surgery: Dr. Donell BeersByerly 12/16/2018  Procedures:   Chest x-ray 12/13/2018  Tagged red blood cell scan 12/15/2018  Upper endoscopy 12/14/2018  2 units transfusion packed red blood cells 12/13/2018  2 units transfusion packed red blood cells 12/15/2018--  2 units transfusion packed red blood cells 12/16/2018  Colonoscopy: Per Dr. Matthias HughsBuccini 12/17/2018  Capsule endoscopy pending 12/18/2018  Antimicrobials:   None   Subjective: Patient laying in bed.  Denies any chest pain or shortness of breath.  Patient noted to have 4 bloody bowel movements overnight and one bloody bowel movement this morning.  Patient denies any abdominal pain.   Objective: Vitals:   12/18/18 0400 12/18/18 0500 12/18/18 0600 12/18/18 0800  BP: (!) 152/79 (!) 138/55 140/63 (!) 151/67  Pulse: 76 84 75 74  Resp:    17  Temp:    98.5 F (36.9 C)  TempSrc:    Oral  SpO2: 98% 100% 100% 100%  Weight:      Height:         Intake/Output Summary (Last 24 hours) at 12/18/2018 1041 Last data filed at 12/18/2018 0800 Gross per 24 hour  Intake 2358.21 ml  Output 3800 ml  Net -1441.79 ml   Filed Weights   12/14/18 1329 12/16/18 0500 12/17/18 1159  Weight: 95.7 kg 95 kg 95 kg    Examination:  General exam: Alert. Respiratory system: Lungs clear to auscultation bilaterally anterior lung fields.  No wheezes, no crackles, no rhonchi.  Speaking in full sentences.  Cardiovascular system: Regular rate rhythm no murmurs rubs or gallops.  No JVD.  No lower extremity edema. Gastrointestinal system: Abdomen is soft, nontender, nondistended, positive bowel sounds.  No rebound.  No guarding. Central nervous system: Alert.  Moving extremities spontaneously.  Extremities: Symmetric 5 x 5 power. Skin: No rashes, lesions or ulcers Psychiatry: Judgement and insight appear normal. Mood & affect appropriate.     Data Reviewed: I have personally reviewed following labs and imaging studies  CBC: Recent Labs  Lab 12/13/18 1131  12/14/18 0141  12/15/18 0145  12/16/18 0209 12/16/18 0855 12/16/18 1641 12/17/18 0213 12/17/18 0958 12/18/18 0224  WBC 9.2  --  7.2  --  10.3  --  8.9  --   --  7.6  --  7.6  NEUTROABS  6.6  --   --   --  8.0*  --   --   --   --   --   --   --   HGB 7.6*   < > 8.3*   < > 7.7*   < > 7.1* 6.6* 8.1* 8.5* 8.6* 7.3*  HCT 23.2*   < > 26.1*   < > 23.9*   < > 22.1* 20.6* 25.0* 26.8* 27.1* 24.2*  MCV 101.8*  --  99.2  --  102.1*  --  103.8*  --   --  100.0  --  100.8*  PLT 192  --  140*  --  142*  --  118*  --   --  105*  --  102*   < > = values in this interval not displayed.   Basic Metabolic Panel: Recent Labs  Lab 12/13/18 2130 12/14/18 0741 12/15/18 0145 12/16/18 0209 12/17/18 0213 12/18/18 0224  NA  --  143 142 146* 147* 144  K  --  3.4* 3.5 3.5 3.8 3.3*  CL  --  109 110 117* 117* 113*  CO2  --  29 21* 23 21* 25  GLUCOSE  --  102* 137* 159* 120* 162*  BUN  --  18 22 31* 24* 7*   CREATININE  --  0.93 0.97 1.06 1.03 0.95  CALCIUM 7.8* 7.9* 7.7* 7.5* 7.6* 7.8*  MG 2.0  --  1.8  --   --   --   PHOS 3.2  --  2.5  --   --   --    GFR: Estimated Creatinine Clearance: 92.4 mL/min (by C-G formula based on SCr of 0.95 mg/dL). Liver Function Tests: Recent Labs  Lab 12/13/18 1131 12/15/18 0145  AST 25 23  ALT 22 28  ALKPHOS 47 40  BILITOT 0.3 0.6  PROT 5.2* 4.7*  ALBUMIN 3.0* 2.9*   Recent Labs  Lab 12/13/18 1131  LIPASE 25   No results for input(s): AMMONIA in the last 168 hours. Coagulation Profile: Recent Labs  Lab 12/13/18 1131 12/14/18 0141  INR 1.1 1.1   Cardiac Enzymes: No results for input(s): CKTOTAL, CKMB, CKMBINDEX, TROPONINI in the last 168 hours. BNP (last 3 results) No results for input(s): PROBNP in the last 8760 hours. HbA1C: No results for input(s): HGBA1C in the last 72 hours. CBG: Recent Labs  Lab 12/17/18 1557 12/17/18 1950 12/17/18 2355 12/18/18 0316 12/18/18 0758  GLUCAP 91 115* 108* 84 108*   Lipid Profile: No results for input(s): CHOL, HDL, LDLCALC, TRIG, CHOLHDL, LDLDIRECT in the last 72 hours. Thyroid Function Tests: No results for input(s): TSH, T4TOTAL, FREET4, T3FREE, THYROIDAB in the last 72 hours. Anemia Panel: No results for input(s): VITAMINB12, FOLATE, FERRITIN, TIBC, IRON, RETICCTPCT in the last 72 hours. Sepsis Labs: No results for input(s): PROCALCITON, LATICACIDVEN in the last 168 hours.  Recent Results (from the past 240 hour(s))  Urine culture     Status: None   Collection Time: 12/13/18 11:43 AM   Specimen: Urine, Clean Catch  Result Value Ref Range Status   Specimen Description   Final    URINE, CLEAN CATCH Performed at [facility], [address] [place], [place] [address]    Special Requests   Final    Normal Performed at [facility], [address] [place], [place] [address]    Culture   Final    NO GROWTH Performed at [facility],  [address], [place], [place] [address]    Report Status 12/14/2018 FINAL  Final  SARS Coronavirus 2 (CEPHEID - Performed in [place] hospital lab), Hosp Order     Status: None   Collection Time: 12/13/18 12:39 PM   Specimen: Nasopharyngeal Swab  Result Value Ref Range Status   SARS Coronavirus 2 NEGATIVE NEGATIVE Final    Comment: (NOTE) If result is NEGATIVE SARS-CoV-2 target nucleic acids are NOT DETECTED. The SARS-CoV-2 RNA is generally detectable in upper and lower  respiratory specimens during the acute phase of infection. The lowest  concentration of SARS-CoV-2 viral copies this assay can detect is 250  copies / mL. A negative result does not preclude SARS-CoV-2 infection  and should not be used as the sole basis for treatment or other  patient management decisions.  A negative result may occur with  improper specimen collection / handling, submission of specimen other  than nasopharyngeal swab, presence of viral mutation(s) within the  areas targeted by this assay, and inadequate number of viral copies  (<250 copies / mL). A negative result must be combined with clinical  observations, patient history, and epidemiological information. If result is POSITIVE SARS-CoV-2 target nucleic acids are DETECTED. The SARS-CoV-2 RNA is generally detectable in upper and lower  respiratory specimens dur ing the acute phase of infection.  Positive  results are indicative of active infection with SARS-CoV-2.  Clinical  correlation with patient history and other diagnostic information is  necessary to determine patient infection status.  Positive results do  not rule out bacterial infection or co-infection with other viruses. If result is PRESUMPTIVE POSTIVE SARS-CoV-2 nucleic acids MAY BE PRESENT.   A presumptive positive result was obtained on the submitted specimen  and confirmed on repeat testing.  While 2019 novel coronavirus  (SARS-CoV-2) nucleic acids may be present in the submitted  sample  additional confirmatory testing may be necessary for epidemiological  and / or clinical management purposes  to differentiate between  SARS-CoV-2 and other Sarbecovirus currently known to infect humans.  If clinically indicated additional testing with an alternate test  methodology [phone]) is advised. The SARS-CoV-2 RNA is generally  detectable in upper and lower respiratory sp ecimens during the acute  phase of infection. The expected result is Negative. Fact Sheet for Patients:  StrictlyIdeas.no Fact Sheet for Healthcare Providers: BankingDealers.co.za This test is not yet approved or cleared by the [place] FDA and has been authorized for detection and/or diagnosis of SARS-CoV-2 by FDA under an Emergency Use Authorization (EUA).  This EUA will remain in effect (meaning this test can be used) for the duration of the COVID-19 declaration under Section 564(b)(1) of the Act, 21 U.S.C. section 360bbb-3(b)(1), unless the authorization is terminated or revoked sooner. Performed at [facility], [place] [address], [place], [place] [address]   MRSA PCR Screening     Status: None   Collection Time: 12/15/18 12:13 PM   Specimen: Nasal Mucosa; Nasopharyngeal  Result Value Ref Range Status   MRSA by PCR NEGATIVE NEGATIVE Final    Comment:        The GeneXpert MRSA Assay (FDA approved for NASAL specimens only), is one component of a comprehensive MRSA colonization surveillance program. It is not intended to diagnose MRSA infection nor to guide or monitor treatment for MRSA infections. Performed at [facility], [place] [address] [place], [place] [address]          Radiology Studies: No results found.  Scheduled Meds: . sodium chloride   Intravenous Once  . sodium chloride   Intravenous Once  . allopurinol  300 mg Oral Daily  . atorvastatin  80 mg Oral Daily  . Chlorhexidine  Gluconate Cloth  6 each Topical Daily  . docusate sodium  100 mg Oral BID  . insulin aspart  0-9 Units Subcutaneous Q4H  . mirtazapine  15 mg Oral QHS  . sodium chloride flush  3 mL Intravenous Q12H   Continuous Infusions: . sodium chloride    . dextrose 5 % and 0.45% NaCl 75 mL/hr at 12/18/18 0800  . sodium chloride       LOS: 5 days    Time spent: 40 minutes    Ramiro Harvestaniel , MD [facility]  If 7PM-7AM, please contact night-coverage [email or web address] 12/18/2018, 10:41 AM

## 2018-12-18 NOTE — Progress Notes (Signed)
PT Cancellation Note  Patient Details Name: James Gibbs MRN: [id number] DOB: Oct 17, 1955   Cancelled Treatment:    Reason Eval/Treat Not Completed: Medical issues which prohibited therapy(Hgb 7.1; RN getting ready to start blood transfusion and requested PT hold at present. Will follow.)   Philomena Doheny PT 12/18/2018  Acute Rehabilitation Services Pager [phone] Office [phone]

## 2018-12-18 NOTE — Progress Notes (Signed)
Endoscopy capsule done per protocol. Patient swallowed capsule without problem. 12 hour study will end at 1100pm. Will download monitor tomorrow am.

## 2018-12-18 NOTE — Progress Notes (Signed)
Entered room to check on patient and found him with CPAP device off.  He stated that it was "to much" with everything else that was going on tonight.

## 2018-12-19 ENCOUNTER — Encounter (HOSPITAL_COMMUNITY): Payer: Self-pay | Admitting: Gastroenterology

## 2018-12-19 LAB — GLUCOSE, CAPILLARY
Glucose-Capillary: 85 mg/dL (ref 70–99)
Glucose-Capillary: 110 mg/dL — ABNORMAL HIGH (ref 70–99)
Glucose-Capillary: 110 mg/dL — ABNORMAL HIGH (ref 70–99)
Glucose-Capillary: 88 mg/dL (ref 70–99)
Glucose-Capillary: 93 mg/dL (ref 70–99)

## 2018-12-19 LAB — BPAM RBC
Blood Product Expiration Date: 202008212359
Blood Product Expiration Date: 202008212359
ISSUE DATE / TIME: 202007251334
ISSUE DATE / TIME: 202007251716
Unit Type and Rh: 5100
Unit Type and Rh: 5100

## 2018-12-19 LAB — BASIC METABOLIC PANEL
Anion gap: 9 (ref 5–15)
BUN: 5 mg/dL — ABNORMAL LOW (ref 8–23)
CO2: 28 mmol/L (ref 22–32)
Calcium: 8.1 mg/dL — ABNORMAL LOW (ref 8.9–10.3)
Chloride: 108 mmol/L (ref 98–111)
Creatinine, Ser: 0.98 mg/dL (ref 0.61–1.24)
GFR calc Af Amer: >60 mL/min (ref >60)
GFR calc non Af Amer: >60 mL/min (ref >60)
Glucose, Bld: 93 mg/dL (ref 70–99)
Potassium: 3.2 mmol/L — ABNORMAL LOW (ref 3.5–5.1)
Sodium: 145 mmol/L (ref 135–145)

## 2018-12-19 LAB — TYPE AND SCREEN
ABO/RH(D): O POS
Antibody Screen: NEGATIVE
Unit division: 0
Unit division: 0

## 2018-12-19 LAB — HEMOGLOBIN AND HEMATOCRIT, BLOOD
HCT: 31.7 % — ABNORMAL LOW (ref 39.0–52.0)
Hemoglobin: 10.3 g/dL — ABNORMAL LOW (ref 13.0–17.0)

## 2018-12-19 LAB — MAGNESIUM: Magnesium: 1.7 mg/dL (ref 1.7–2.4)

## 2018-12-19 MED ORDER — POTASSIUM CHLORIDE 10 MEQ/100ML IV SOLN
10.0000 meq | INTRAVENOUS | Status: AC
  Administered 2018-12-19 (×3): 10 meq via INTRAVENOUS
  Filled 2018-12-19 (×3): qty 100

## 2018-12-19 MED ORDER — POTASSIUM CHLORIDE CRYS ER 20 MEQ PO TBCR
40.0000 meq | EXTENDED_RELEASE_TABLET | Freq: Once | ORAL | Status: AC
  Administered 2018-12-19: 08:00:00 40 meq via ORAL
  Filled 2018-12-19: qty 2

## 2018-12-19 MED ORDER — MAGNESIUM SULFATE 4 GM/100ML IV SOLN
4.0000 g | Freq: Once | INTRAVENOUS | Status: AC
  Administered 2018-12-19: 08:00:00 4 g via INTRAVENOUS
  Filled 2018-12-19: qty 100

## 2018-12-19 MED ORDER — SODIUM CHLORIDE 0.9 % IV SOLN: INTRAVENOUS | Status: DC

## 2018-12-19 MED ORDER — INSULIN ASPART 100 UNIT/ML ~~LOC~~ SOLN
0.0000 [IU] | Freq: Three times a day (TID) | SUBCUTANEOUS | Status: DC
  Administered 2018-12-20: 12:00:00 2 [IU] via SUBCUTANEOUS
  Administered 2018-12-22: 12:00:00 1 [IU] via SUBCUTANEOUS

## 2018-12-19 NOTE — Progress Notes (Signed)
Patient ID: James Gibbs, male   DOB: 08-Aug-1955, 63 y.o.   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 If bleeding recurs, then may need referral to tertiary care center for double balloon enteroscopy. Complete capsule report to be scanned by medical records. Findings d/w Dr. Grandville Silos. Continue supportive care.

## 2018-12-19 NOTE — Progress Notes (Signed)
PROGRESS NOTE    James Gibbs  LFY:[id number] DOB: 20-Jun-1955 DOA: 12/13/2018 PCP: Thurman Coyer, MD    Brief Narrative:  HPI per Dr. Skipper Cliche on 12/13/2018  James Collin Stewartis a 63 y.o.malewith medical history significant of18 year old male with past medical history of hypertension, hyperlipidemia, mitral valve prolapse,DM II,presenting with questionable syncope presyncopal episode,noting blood in stool.  Patient Denies having: Fever, Chills, Cough, SOB, Chest Pain, Abd pain, N/V/D, headache, dizziness, lightheadedness, joint pain, rash, open wounds  ED Course: Mildly hypotensive, H&H down from baseline 2 units of packed red blood cell has been ordered pending transfusion GI Dr.Buccinihas been consulted pending evaluation recommendations.  Patient to be admitted for GI evaluation, anemia due to GI bleed blood transfusion  Patient subsequently underwent upper endoscopy on 12/14/2018 with no source of bleeding.   Assessment & Plan:   Principal Problem:   GIB (gastrointestinal bleeding) Active Problems:   Syncope, vasovagal   MITRAL REGURGITATION   HTN (hypertension)   Hypotension   Gout   Acute blood loss anemia  1 vasovagal syncope Patient noted to have a vasovagal syncope during this hospitalization whenever he got up to sit at the bedside commode and was having bloody bowel movements.  Patient has been having vasovagal syncopal episodes since admission with ongoing bloody bowel movements and ongoing bleeding.  Repeat H&H with hemoglobin at 10.3 at 2:45 AM this morning.  Patient per RN with 2 brown stools overnight.  Hopefully bleeding slowing down.  Patient has received 8 units of packed red blood cells during this hospitalization.  Repeat H&H. Continue TED hose, IV fluids.  CT head negative for any acute abnormalities.  Continue to monitor in the ICU.  GI following.   2.  Acute GI bleed/acute blood loss anemia Patient admitted with  syncopal/presyncopal episode noted to be mildly hypotensive on admission with anemia.  Hemoglobin on admission was down to 7.6.  Patient transfused 2 units packed red blood cells.  Hemoglobin noted to be 6.8 the morning of 12/15/2018 and patient transfused another 2 units of packed red blood cells.  Patient with ongoing bloody bowel movements. Patient with large bloody bowel movement the morning of 12/16/2018, per RN with a vasovagal syncopal episode.   Patient seen by GI underwent upper endoscopy with no source of bleeding noted.  Patient with vasovagal syncopal episodes likely attributed to acute GI bleed.  Patient currently in the ICU unit due to ongoing GI bleed and vasovagal syncopal episodes.   Patient subsequently underwent a nuclear medicine GI bleeding scan on 12/15/2018 which showed a small area of increased activity left upper quadrant likely representing very slow hemorrhage within the splenic flexure.  Patient with repeat H&H of 10.3 this morning at 2:45 AM.  Patient has received a total of 8 units packed red blood cells during this hospitalization.  Patient noted to have 2 brown stools overnight per RN. Patient being followed by GI and patient underwent colonoscopy on 12/17/2018 which showed no evidence of colonic diverticula and old blood was noted throughout the colon concern for bleeding proximal to ileocecal valve per GI.  Patient with bloody bowel movements the night of 12/17/2018 in the morning of 12/18/2018. Transfusion goal hemoglobin less than 8.  Patient s/p capsule endoscopy (12/18/2018 ) per GI.  Due to patient's ongoing bleeding, vasovagal syncope, now that is post transfusion of 8 units of packed red blood cells, general surgery was consulted for further evaluation and management.  General surgery following.  Per GI.  3.  Mitral regurgitation Stable.  Monitor closely for volume overload as patient with ongoing bleeding requiring transfusion of packed red blood cells.  4.  Mild  hypotension Felt secondary to volume depletion in the setting of GI bleed.  Patient's antihypertensive medications of HCTZ and lisinopril on hold.  Norvasc has been discontinued.  Hypotension improved.  Saline lock IV fluids.  5.  Hyperlipidemia Continue statin.  6.  Type 2 diabetes mellitus Oral hypoglycemic agents on hold.  CBG of 110 this morning.  Continue SSI.  7.  Gout Stable.  Continue allopurinol.  8.  Hypokalemia Patient receiving 3 rounds of IV potassium.  We will give K. Dur 40 mEq p.o. x1 as potassium at 3.2.  Magnesium at 1.7.  4 g magnesium sulfate IV x1. Follow.   DVT prophylaxis: SCDs Code Status: Full Family Communication: updated patient. Disposition Plan: Remain in ICU.  Patient with ongoing bleeding with vasovagal syncope and unsafe to be discharged at this time and requiring inpatient treatment and management.    Consultants:   Gastroenterology: Dr. Matthias HughsBuccini 12/13/2018  General surgery: Dr. Donell BeersByerly 12/16/2018  Procedures:   Chest x-ray 12/13/2018  Tagged red blood cell scan 12/15/2018  Upper endoscopy 12/14/2018  2 units transfusion packed red blood cells 12/13/2018  2 units transfusion packed red blood cells 12/15/2018--  2 units transfusion packed red blood cells 12/16/2018  2 units transfusion packed red blood cells 12/18/2018  Colonoscopy: Per Dr. Matthias HughsBuccini 12/17/2018  Capsule endoscopy 12/18/2018  Antimicrobials:   None   Subjective: Patient in bed on the telephone.  Denies any chest pain or shortness of breath.  Feeling better.  Tolerated soft diet overnight.  No abdominal pain.  Patient noted to have 2 brown stools overnight.   Objective: Vitals:   12/19/18 0700 12/19/18 0749 12/19/18 0800 12/19/18 0900  BP: (!) 149/69  (!) 141/72 139/60  Pulse: 68  66 68  Resp: 15  17 19   Temp:  97.8 F (36.6 C)    TempSrc:  Oral    SpO2: 99%  98% 100%  Weight:      Height:        Intake/Output Summary (Last 24 hours) at 12/19/2018 0914 Last data  filed at 12/19/2018 0909 Gross per 24 hour  Intake 2457.24 ml  Output 4600 ml  Net -2142.76 ml   Filed Weights   12/17/18 1159 12/18/18 1048 12/19/18 0500  Weight: 95 kg 95 kg 95.5 kg    Examination:  General exam: NAD Respiratory system: CTAB.  No wheezes, no crackles, no rhonchi.  Normal respiratory effort.  Speaking in full sentences.  Cardiovascular system: RRR no murmurs rubs or gallops.  No JVD.  No lower extremity edema.  Gastrointestinal system: Abdomen is nontender, nondistended, soft, positive bowel sounds.  No rebound.  No guarding. Central nervous system: Alert.  Moving extremities spontaneously.  Extremities: Symmetric 5 x 5 power. Skin: No rashes, lesions or ulcers Psychiatry: Judgement and insight appear normal. Mood & affect appropriate.     Data Reviewed: I have personally reviewed following labs and imaging studies  CBC: Recent Labs  Lab 12/13/18 1131  12/14/18 0141  12/15/18 0145  12/16/18 0209  12/17/18 0213 12/17/18 0958 12/18/18 0224 12/18/18 1035 12/19/18 0245  WBC 9.2  --  7.2  --  10.3  --  8.9  --  7.6  --  7.6  --   --   NEUTROABS 6.6  --   --   --  8.0*  --   --   --   --   --   --   --   --  HGB 7.6*   < > 8.3*   < > 7.7*   < > 7.1*   < > 8.5* 8.6* 7.3* 7.1* 10.3*  HCT 23.2*   < > 26.1*   < > 23.9*   < > 22.1*   < > 26.8* 27.1* 24.2* 22.0* 31.7*  MCV 101.8*  --  99.2  --  102.1*  --  103.8*  --  100.0  --  100.8*  --   --   PLT 192  --  140*  --  142*  --  118*  --  105*  --  102*  --   --    < > = values in this interval not displayed.   Basic Metabolic Panel: Recent Labs  Lab 12/13/18 2130  12/15/18 0145 12/16/18 0209 12/17/18 0213 12/18/18 0224 12/19/18 0245  NA  --    < > 142 146* 147* 144 145  K  --    < > 3.5 3.5 3.8 3.3* 3.2*  CL  --    < > 110 117* 117* 113* 108  CO2  --    < > 21* 23 21* 25 28  GLUCOSE  --    < > 137* 159* 120* 162* 93  BUN  --    < > 22 31* 24* 7* 5*  CREATININE  --    < > 0.97 1.06 1.03 0.95 0.98   CALCIUM 7.8*   < > 7.7* 7.5* 7.6* 7.8* 8.1*  MG 2.0  --  1.8  --   --   --  1.7  PHOS 3.2  --  2.5  --   --   --   --    < > = values in this interval not displayed.   GFR: Estimated Creatinine Clearance: 89.8 mL/min (by C-G formula based on SCr of 0.98 mg/dL). Liver Function Tests: Recent Labs  Lab 12/13/18 1131 12/15/18 0145  AST 25 23  ALT 22 28  ALKPHOS 47 40  BILITOT 0.3 0.6  PROT 5.2* 4.7*  ALBUMIN 3.0* 2.9*   Recent Labs  Lab 12/13/18 1131  LIPASE 25   No results for input(s): AMMONIA in the last 168 hours. Coagulation Profile: Recent Labs  Lab 12/13/18 1131 12/14/18 0141  INR 1.1 1.1   Cardiac Enzymes: No results for input(s): CKTOTAL, CKMB, CKMBINDEX, TROPONINI in the last 168 hours. BNP (last 3 results) No results for input(s): PROBNP in the last 8760 hours. HbA1C: No results for input(s): HGBA1C in the last 72 hours. CBG: Recent Labs  Lab 12/18/18 1534 12/18/18 2002 12/18/18 2355 12/19/18 0339 12/19/18 0730  GLUCAP 107* 86 102* 85 110*   Lipid Profile: No results for input(s): CHOL, HDL, LDLCALC, TRIG, CHOLHDL, LDLDIRECT in the last 72 hours. Thyroid Function Tests: No results for input(s): TSH, T4TOTAL, FREET4, T3FREE, THYROIDAB in the last 72 hours. Anemia Panel: No results for input(s): VITAMINB12, FOLATE, FERRITIN, TIBC, IRON, RETICCTPCT in the last 72 hours. Sepsis Labs: No results for input(s): PROCALCITON, LATICACIDVEN in the last 168 hours.  Recent Results (from the past 240 hour(s))  Urine culture     Status: None   Collection Time: 12/13/18 11:43 AM   Specimen: Urine, Clean Catch  Result Value Ref Range Status   Specimen Description   Final    URINE, CLEAN CATCH Performed at [facility], [address] [place], [place] [address]    Special Requests   Final    Normal Performed at [facility], [address] [place], [place] [address]    Culture   Final    NO GROWTH Performed at [facility], [address] [place], [place] [address]    Report Status 12/14/2018 FINAL  Final  SARS Coronavirus 2 (CEPHEID - Performed in [facility] hospital lab), Hosp Order     Status: None   Collection Time: 12/13/18 12:39 PM   Specimen: Nasopharyngeal Swab  Result Value Ref Range Status   SARS Coronavirus 2 NEGATIVE NEGATIVE Final    Comment: (NOTE) If result is NEGATIVE SARS-CoV-2 target nucleic acids are NOT DETECTED. The SARS-CoV-2 RNA is generally detectable in upper and lower  respiratory specimens during the acute phase of infection. The lowest  concentration of SARS-CoV-2 viral copies this assay can detect is 250  copies / mL. A negative result does not preclude SARS-CoV-2 infection  and should not be used as the sole basis for treatment or other  patient management decisions.  A negative result may occur with  improper specimen collection / handling, submission of specimen other  than nasopharyngeal swab, presence of viral mutation(s) within the  areas targeted by this assay, and inadequate number of viral copies  (<250 copies / mL). A negative result must be combined with clinical  observations, patient history, and epidemiological information. If result is POSITIVE SARS-CoV-2 target nucleic acids are DETECTED. The SARS-CoV-2 RNA is generally detectable in upper and lower  respiratory specimens dur ing the acute phase of infection.  Positive  results are indicative of active infection with SARS-CoV-2.  Clinical  correlation with patient history and other diagnostic information is  necessary to determine patient infection status.  Positive results do  not rule out bacterial infection or co-infection with other viruses. If result is PRESUMPTIVE POSTIVE SARS-CoV-2 nucleic acids MAY BE PRESENT.   A presumptive positive result was obtained on the submitted specimen  and confirmed on repeat testing.  While 2019 novel coronavirus  (SARS-CoV-2) nucleic acids may be  present in the submitted sample  additional confirmatory testing may be necessary for epidemiological  and / or clinical management purposes  to differentiate between  SARS-CoV-2 and other Sarbecovirus currently known to infect humans.  If clinically indicated additional testing with an alternate test  methodology [id number]) is advised. The SARS-CoV-2 RNA is generally  detectable in upper and lower respiratory sp ecimens during the acute  phase of infection. The expected result is Negative. Fact Sheet for Patients:  BoilerBrush.com.cyhttps://www.fda.gov/media/136312/download Fact Sheet for Healthcare Providers: https://pope.com/https://www.fda.gov/media/136313/download This test is not yet approved or cleared by the [place] FDA and has been authorized for detection and/or diagnosis of SARS-CoV-2 by FDA under an Emergency Use Authorization (EUA).  This EUA will remain in effect (meaning this test can be used) for the duration of the COVID-19 declaration under Section 564(b)(1) of the Act, 21 U.S.C. section 360bbb-3(b)(1), unless the authorization is terminated or revoked sooner. Performed at [facility], [address] [place], [place] [address]   MRSA PCR Screening     Status: None   Collection Time: 12/15/18 12:13 PM   Specimen: Nasal Mucosa; Nasopharyngeal  Result Value Ref Range Status   MRSA by PCR NEGATIVE NEGATIVE Final    Comment:        The GeneXpert MRSA Assay (FDA approved for NASAL specimens only), is one component of a comprehensive MRSA colonization surveillance program. It is not intended to diagnose MRSA infection nor to guide or monitor treatment for MRSA infections. Performed  at [facility], [address], [place], [place] [address]          Radiology Studies: No results found.      Scheduled Meds: . allopurinol  300 mg Oral Daily  . atorvastatin  80 mg Oral Daily  . Chlorhexidine Gluconate Cloth  6 each Topical Daily  . docusate  sodium  100 mg Oral BID  . insulin aspart  0-9 Units Subcutaneous Q4H  . mirtazapine  15 mg Oral QHS  . sodium chloride flush  3 mL Intravenous Q12H   Continuous Infusions: . magnesium sulfate bolus IVPB 50 mL/hr at 12/19/18 0909  . sodium chloride       LOS: 6 days    Time spent: 40 minutes    Ramiro Harvest, MD [facility]  If 7PM-7AM, please contact night-coverage [email or web address] 12/19/2018, 9:14 AM

## 2018-12-20 ENCOUNTER — Encounter (HOSPITAL_COMMUNITY): Payer: Self-pay | Admitting: Radiology

## 2018-12-20 ENCOUNTER — Inpatient Hospital Stay (HOSPITAL_COMMUNITY): Payer: Medicare HMO

## 2018-12-20 LAB — BASIC METABOLIC PANEL
Anion gap: 10 (ref 5–15)
BUN: 11 mg/dL (ref 8–23)
CO2: 25 mmol/L (ref 22–32)
Calcium: 8 mg/dL — ABNORMAL LOW (ref 8.9–10.3)
Chloride: 106 mmol/L (ref 98–111)
Creatinine, Ser: 1.14 mg/dL (ref 0.61–1.24)
GFR calc Af Amer: >60 mL/min (ref >60)
GFR calc non Af Amer: >60 mL/min (ref >60)
Glucose, Bld: 90 mg/dL (ref 70–99)
Potassium: 3.5 mmol/L (ref 3.5–5.1)
Sodium: 141 mmol/L (ref 135–145)

## 2018-12-20 LAB — GLUCOSE, CAPILLARY
Glucose-Capillary: 85 mg/dL (ref 70–99)
Glucose-Capillary: 159 mg/dL — ABNORMAL HIGH (ref 70–99)
Glucose-Capillary: 64 mg/dL — ABNORMAL LOW (ref 70–99)
Glucose-Capillary: 77 mg/dL (ref 70–99)
Glucose-Capillary: 83 mg/dL (ref 70–99)

## 2018-12-20 LAB — CBC
HCT: 30.6 % — ABNORMAL LOW (ref 39.0–52.0)
Hemoglobin: 9.9 g/dL — ABNORMAL LOW (ref 13.0–17.0)
MCH: 31.7 pg (ref 26.0–34.0)
MCHC: 32.4 g/dL (ref 30.0–36.0)
MCV: 98.1 fL (ref 80.0–100.0)
Platelets: 108 10*3/uL — ABNORMAL LOW (ref 150–400)
RBC: 3.12 MIL/uL — ABNORMAL LOW (ref 4.22–5.81)
RDW: 17.7 % — ABNORMAL HIGH (ref 11.5–15.5)
WBC: 6.9 10*3/uL (ref 4.0–10.5)
nRBC: 0 % (ref 0.0–0.2)

## 2018-12-20 LAB — MAGNESIUM: Magnesium: 2.2 mg/dL (ref 1.7–2.4)

## 2018-12-20 MED ORDER — POTASSIUM CHLORIDE CRYS ER 20 MEQ PO TBCR
20.0000 meq | EXTENDED_RELEASE_TABLET | Freq: Every day | ORAL | Status: DC
  Administered 2018-12-20 – 2018-12-21 (×2): 20 meq via ORAL
  Filled 2018-12-20 (×2): qty 1

## 2018-12-20 MED ORDER — AMLODIPINE BESYLATE 5 MG PO TABS
5.0000 mg | ORAL_TABLET | Freq: Every day | ORAL | Status: DC
  Administered 2018-12-20 – 2018-12-22 (×3): 5 mg via ORAL
  Filled 2018-12-20 (×3): qty 1

## 2018-12-20 MED ORDER — IOHEXOL 300 MG/ML  SOLN
30.0000 mL | Freq: Once | INTRAMUSCULAR | Status: AC | PRN
  Administered 2018-12-20: 11:00:00 30 mL via ORAL

## 2018-12-20 MED ORDER — BISACODYL 10 MG RE SUPP
10.0000 mg | Freq: Once | RECTAL | Status: DC
  Filled 2018-12-20: qty 1

## 2018-12-20 MED ORDER — POLYETHYLENE GLYCOL 3350 17 G PO PACK
17.0000 g | PACK | Freq: Two times a day (BID) | ORAL | Status: DC
  Administered 2018-12-20 – 2018-12-22 (×5): 17 g via ORAL
  Filled 2018-12-20 (×5): qty 1

## 2018-12-20 MED ORDER — IOHEXOL 300 MG/ML  SOLN
100.0000 mL | Freq: Once | INTRAMUSCULAR | Status: AC | PRN
  Administered 2018-12-20: 14:00:00 100 mL via INTRAVENOUS

## 2018-12-20 MED ORDER — SODIUM CHLORIDE (PF) 0.9 % IJ SOLN
INTRAMUSCULAR | Status: AC
  Filled 2018-12-20: qty 50

## 2018-12-20 MED ORDER — POTASSIUM CHLORIDE CRYS ER 20 MEQ PO TBCR
40.0000 meq | EXTENDED_RELEASE_TABLET | Freq: Once | ORAL | Status: AC
  Administered 2018-12-20: 40 meq via ORAL
  Filled 2018-12-20: qty 2

## 2018-12-20 MED ORDER — LISINOPRIL 10 MG PO TABS
40.0000 mg | ORAL_TABLET | Freq: Every day | ORAL | Status: DC
  Administered 2018-12-20 – 2018-12-22 (×3): 40 mg via ORAL
  Filled 2018-12-20 (×3): qty 4

## 2018-12-20 MED ORDER — HYDROCHLOROTHIAZIDE 25 MG PO TABS
25.0000 mg | ORAL_TABLET | Freq: Every day | ORAL | Status: DC
  Administered 2018-12-21 – 2018-12-22 (×2): 25 mg via ORAL
  Filled 2018-12-20 (×3): qty 1

## 2018-12-20 NOTE — Progress Notes (Signed)
PROGRESS NOTE    James Gibbs  OBS:[id number] DOB: 28-Feb-1956 DOA: 12/13/2018 PCP: Thurman Coyer, MD    Brief Narrative:  HPI per Dr. Skipper Cliche on 12/13/2018  James Collin Stewartis a 63 y.o.malewith medical history significant of75 year old male with past medical history of hypertension, hyperlipidemia, mitral valve prolapse,DM II,presenting with questionable syncope presyncopal episode,noting blood in stool.  Patient Denies having: Fever, Chills, Cough, SOB, Chest Pain, Abd pain, N/V/D, headache, dizziness, lightheadedness, joint pain, rash, open wounds  ED Course: Mildly hypotensive, H&H down from baseline 2 units of packed red blood cell has been ordered pending transfusion GI Dr.Buccinihas been consulted pending evaluation recommendations.  Patient to be admitted for GI evaluation, anemia due to GI bleed blood transfusion  Patient subsequently underwent upper endoscopy on 12/14/2018 with no source of bleeding.   Assessment & Plan:   Principal Problem:   GIB (gastrointestinal bleeding) Active Problems:   Syncope, vasovagal   MITRAL REGURGITATION   HTN (hypertension)   Hypotension   Gout   Acute blood loss anemia  1 vasovagal syncope Patient noted to have a vasovagal syncope during this hospitalization whenever he got up to sit at the bedside commode and was having bloody bowel movements.  Patient has been having vasovagal syncopal episodes since admission with ongoing bloody bowel movements and ongoing bleeding.  Repeat H&H with hemoglobin at 9.9 today.  No further bloody bowel movements noted overnight per RN.  Hopefully bleeding slowing down.  Patient has received 8 units of packed red blood cells during this hospitalization.  Repeat H&H. Continue TED hose, IV fluids.  CT head negative for any acute abnormalities.  Continue to monitor in the ICU.  GI following.   2.  Acute GI bleed/acute blood loss anemia Patient admitted with syncopal/presyncopal  episode noted to be mildly hypotensive on admission with anemia.  Hemoglobin on admission was down to 7.6.  Patient transfused 2 units packed red blood cells.  Hemoglobin noted to be 6.8 the morning of 12/15/2018 and patient transfused another 2 units of packed red blood cells.  Patient with ongoing bloody bowel movements. Patient with large bloody bowel movement the morning of 12/16/2018, per RN with a vasovagal syncopal episode.   Patient seen by GI underwent upper endoscopy with no source of bleeding noted.  Patient with vasovagal syncopal episodes likely attributed to acute GI bleed.  Patient currently in the ICU unit due to ongoing GI bleed and vasovagal syncopal episodes.   Patient subsequently underwent a nuclear medicine GI bleeding scan on 12/15/2018 which showed a small area of increased activity left upper quadrant likely representing very slow hemorrhage within the splenic flexure.  Patient with repeat H&H of 9.9 this morning.  Hemoglobin seems to be stabilizing.  No overt bleeding per RN noted overnight.  Patient has received a total of 8 units packed red blood cells during this hospitalization.  Patient noted to have 2 brown stools overnight per RN. Patient being followed by GI and patient underwent colonoscopy on 12/17/2018 which showed no evidence of colonic diverticula and old blood was noted throughout the colon concern for bleeding proximal to ileocecal valve per GI.  Patient with bloody bowel movements the night of 12/17/2018 in the morning of 12/18/2018. Transfusion goal hemoglobin less than 8.  Patient s/p capsule endoscopy (12/18/2018 ) per GI.  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  Per GI if bleeding recurs  then may need referral to tertiary care center for double-balloon enteroscopy.  GI has ordered repeat CT abdomen and pelvis.  Due to patient's ongoing bleeding,  vasovagal syncope, now that is post transfusion of 8 units of packed red blood cells, general surgery was consulted for further evaluation and management.  Hemoglobin currently stable at 9.9.  General surgery following.  Per GI.   3.  Mitral regurgitation Stable.  Monitor closely for volume overload as patient with ongoing bleeding requiring transfusion of packed red blood cells.  4.  Mild hypotension Felt secondary to volume depletion in the setting of GI bleed.  Patient's antihypertensive medications of HCTZ and lisinopril on hold.  Norvasc has been discontinued.  Hypotension improved.  Saline lock IV fluids.  5.  Hyperlipidemia Continue statin.  6.  Type 2 diabetes mellitus Oral hypoglycemic agents on hold.  CBG of 85 this morning.  Continue SSI.  7.  Gout Stable.  Continue allopurinol.  8.  Hypokalemia Patient with potassium of 3.5 this morning.  Gave oral potassium p.o. x1.  Magnesium at 2.2.  9.  Hypertension Blood pressure improved.  Start Norvasc at 5 mg daily.   DVT prophylaxis: SCDs Code Status: Full Family Communication: updated patient.  Updated daughter via face time and patient room. Disposition Plan: Transfer to stepdown unit.  Patient with ongoing bleeding with vasovagal syncope and unsafe to be discharged at this time and requiring inpatient treatment and management.    Consultants:   Gastroenterology: Dr. Matthias HughsBuccini 12/13/2018  General surgery: Dr. Donell BeersByerly 12/16/2018  Procedures:   Chest x-ray 12/13/2018  Tagged red blood cell scan 12/15/2018  Upper endoscopy 12/14/2018  2 units transfusion packed red blood cells 12/13/2018  2 units transfusion packed red blood cells 12/15/2018--  2 units transfusion packed red blood cells 12/16/2018  2 units transfusion packed red blood cells 12/18/2018  Colonoscopy: Per Dr. Matthias HughsBuccini 12/17/2018  Capsule endoscopy 12/18/2018  DG abdomen 12/20/2018  Antimicrobials:   None   Subjective: Patient with no noted bloody bowel  movements overnight.  Denies any chest pain or shortness of breath.  Feeling better.    Objective: Vitals:   12/20/18 0600 12/20/18 0700 12/20/18 0800 12/20/18 0900  BP: (!) 158/81 (!) 147/90 (!) 144/76 (!) 158/86  Pulse:      Resp: 17 (!) 25 19 (!) 25  Temp:   97.9 F (36.6 C)   TempSrc:   Axillary   SpO2:      Weight:      Height:        Intake/Output Summary (Last 24 hours) at 12/20/2018 0941 Last data filed at 12/20/2018 0600 Gross per 24 hour  Intake --  Output 1200 ml  Net -1200 ml   Filed Weights   12/18/18 1048 12/19/18 0500 12/20/18 0443  Weight: 95 kg 95.5 kg 96.6 kg    Examination:  General exam: NAD Respiratory system: Lungs clear to auscultation bilaterally.  No wheezes, no crackles, no rhonchi.  Normal respiratory effort.  Speaking in full sentences.  Cardiovascular system: Regular rate rhythm no murmurs rubs or gallops.  No JVD.  No lower extremity edema.  Gastrointestinal system: Abdomen is soft, nontender, nondistended, positive bowel sounds.  No rebound.  No guarding.  Central nervous system: Alert.  Moving extremities spontaneously.  Extremities: Symmetric 5 x 5 power. Skin: No rashes, lesions or ulcers Psychiatry: Judgement and insight appear normal. Mood & affect appropriate.     Data Reviewed: I have personally reviewed following labs and imaging studies  CBC:  Recent Labs  Lab 12/13/18 1131  12/15/18 0145  12/16/18 0209  12/17/18 0213 12/17/18 0958 12/18/18 0224 12/18/18 1035 12/19/18 0245 12/20/18 0237  WBC 9.2   < > 10.3  --  8.9  --  7.6  --  7.6  --   --  6.9  NEUTROABS 6.6  --  8.0*  --   --   --   --   --   --   --   --   --   HGB 7.6*   < > 7.7*   < > 7.1*   < > 8.5* 8.6* 7.3* 7.1* 10.3* 9.9*  HCT 23.2*   < > 23.9*   < > 22.1*   < > 26.8* 27.1* 24.2* 22.0* 31.7* 30.6*  MCV 101.8*   < > 102.1*  --  103.8*  --  100.0  --  100.8*  --   --  98.1  PLT 192   < > 142*  --  118*  --  105*  --  102*  --   --  108*   < > = values in this  interval not displayed.   Basic Metabolic Panel: Recent Labs  Lab 12/13/18 2130  12/15/18 0145 12/16/18 0209 12/17/18 0213 12/18/18 0224 12/19/18 0245 12/20/18 0237  NA  --    < > 142 146* 147* 144 145 141  K  --    < > 3.5 3.5 3.8 3.3* 3.2* 3.5  CL  --    < > 110 117* 117* 113* 108 106  CO2  --    < > 21* 23 21* 25 28 25   GLUCOSE  --    < > 137* 159* 120* 162* 93 90  BUN  --    < > 22 31* 24* 7* 5* 11  CREATININE  --    < > 0.97 1.06 1.03 0.95 0.98 1.14  CALCIUM 7.8*   < > 7.7* 7.5* 7.6* 7.8* 8.1* 8.0*  MG 2.0  --  1.8  --   --   --  1.7 2.2  PHOS 3.2  --  2.5  --   --   --   --   --    < > = values in this interval not displayed.   GFR: Estimated Creatinine Clearance: 77.6 mL/min (by C-G formula based on SCr of 1.14 mg/dL). Liver Function Tests: Recent Labs  Lab 12/13/18 1131 12/15/18 0145  AST 25 23  ALT 22 28  ALKPHOS 47 40  BILITOT 0.3 0.6  PROT 5.2* 4.7*  ALBUMIN 3.0* 2.9*   Recent Labs  Lab 12/13/18 1131  LIPASE 25   No results for input(s): AMMONIA in the last 168 hours. Coagulation Profile: Recent Labs  Lab 12/13/18 1131 12/14/18 0141  INR 1.1 1.1   Cardiac Enzymes: No results for input(s): CKTOTAL, CKMB, CKMBINDEX, TROPONINI in the last 168 hours. BNP (last 3 results) No results for input(s): PROBNP in the last 8760 hours. HbA1C: No results for input(s): HGBA1C in the last 72 hours. CBG: Recent Labs  Lab 12/19/18 0730 12/19/18 1142 12/19/18 1711 12/19/18 2118 12/20/18 0746  GLUCAP 110* 110* 88 93 85   Lipid Profile: No results for input(s): CHOL, HDL, LDLCALC, TRIG, CHOLHDL, LDLDIRECT in the last 72 hours. Thyroid Function Tests: No results for input(s): TSH, T4TOTAL, FREET4, T3FREE, THYROIDAB in the last 72 hours. Anemia Panel: No results for input(s): VITAMINB12, FOLATE, FERRITIN, TIBC, IRON, RETICCTPCT in the last 72 hours. Sepsis Labs: No  results for input(s): PROCALCITON, LATICACIDVEN in the last 168 hours.  Recent Results  (from the past 240 hour(s))  Urine culture     Status: None   Collection Time: 12/13/18 11:43 AM   Specimen: Urine, Clean Catch  Result Value Ref Range Status   Specimen Description   Final    URINE, CLEAN CATCH Performed at [facility], [address] [place], [place] [address]    Special Requests   Final    Normal Performed at [facility], [address] [place], [place] [address]    Culture   Final    NO GROWTH Performed at [facility], [address] [place], [place] [address]    Report Status 12/14/2018 FINAL  Final  SARS Coronavirus 2 (CEPHEID - Performed in [facility] hospital lab), Hosp Order     Status: None   Collection Time: 12/13/18 12:39 PM   Specimen: Nasopharyngeal Swab  Result Value Ref Range Status   SARS Coronavirus 2 NEGATIVE NEGATIVE Final    Comment: (NOTE) If result is NEGATIVE SARS-CoV-2 target nucleic acids are NOT DETECTED. The SARS-CoV-2 RNA is generally detectable in upper and lower  respiratory specimens during the acute phase of infection. The lowest  concentration of SARS-CoV-2 viral copies this assay can detect is 250  copies / mL. A negative result does not preclude SARS-CoV-2 infection  and should not be used as the sole basis for treatment or other  patient management decisions.  A negative result may occur with  improper specimen collection / handling, submission of specimen other  than nasopharyngeal swab, presence of viral mutation(s) within the  areas targeted by this assay, and inadequate number of viral copies  (<250 copies / mL). A negative result must be combined with clinical  observations, patient history, and epidemiological information. If result is POSITIVE SARS-CoV-2 target nucleic acids are DETECTED. The SARS-CoV-2 RNA is generally detectable in upper and lower  respiratory specimens dur ing the acute phase of infection.  Positive  results are indicative of active infection  with SARS-CoV-2.  Clinical  correlation with patient history and other diagnostic information is  necessary to determine patient infection status.  Positive results do  not rule out bacterial infection or co-infection with other viruses. If result is PRESUMPTIVE POSTIVE SARS-CoV-2 nucleic acids MAY BE PRESENT.   A presumptive positive result was obtained on the submitted specimen  and confirmed on repeat testing.  While 2019 novel coronavirus  (SARS-CoV-2) nucleic acids may be present in the submitted sample  additional confirmatory testing may be necessary for epidemiological  and / or clinical management purposes  to differentiate between  SARS-CoV-2 and other Sarbecovirus currently known to infect humans.  If clinically indicated additional testing with an alternate test  methodology [phone]([id number]) is advised. The SARS-CoV-2 RNA is generally  detectable in upper and lower respiratory sp ecimens during the acute  phase of infection. The expected result is Negative. Fact Sheet for Patients:  BoilerBrush.com.cyhttps://www.fda.gov/media/136312/download Fact Sheet for Healthcare Providers: https://pope.com/https://www.fda.gov/media/136313/download This test is not yet approved or cleared by the [place] FDA and has been authorized for detection and/or diagnosis of SARS-CoV-2 by FDA under an Emergency Use Authorization (EUA).  This EUA will remain in effect (meaning this test can be used) for the duration of the COVID-19 declaration under Section 564(b)(1) of the Act, 21 U.S.C. section 360bbb-3(b)(1), unless the authorization is terminated or revoked sooner. Performed at [facility], [address] [place],  [place] [address]   MRSA PCR Screening     Status: None   Collection Time: 12/15/18 12:13 PM   Specimen: Nasal Mucosa; Nasopharyngeal  Result Value Ref Range Status   MRSA by PCR NEGATIVE NEGATIVE Final    Comment:        The GeneXpert MRSA Assay (FDA approved for NASAL specimens only), is  one component of a comprehensive MRSA colonization surveillance program. It is not intended to diagnose MRSA infection nor to guide or monitor treatment for MRSA infections. Performed at [facility], [address] [place], [place] [address]          Radiology Studies: Dg Abd 1 View  Result Date: 12/20/2018 CLINICAL DATA:  Follow-up on capsule endoscopy EXAM: ABDOMEN - 1 VIEW COMPARISON:  None. FINDINGS: Endoscopy capsule overlies the left lower pelvis, presumably within the sigmoid colon. Nonobstructive bowel gas pattern. Unremarkable colonic stool burden. Nondiagnostic evaluation for pneumoperitoneum secondary to supine positioning and exclusion of the lower thorax. No pneumatosis or portal venous gas. No definitive abnormal intra-abdominal calcifications. Degenerative change of the lower lumbar spine and right pubic symphysis is suspected though incompletely evaluated. Regional soft tissues appear normal. IMPRESSION: Endoscopy capsule overlies the left lower pelvis, presumably within the sigmoid colon. Electronically Signed   By: Simonne Come M.D.   On: 12/20/2018 07:28        Scheduled Meds:  allopurinol  300 mg Oral Daily   amLODipine  5 mg Oral Daily   atorvastatin  80 mg Oral Daily   bisacodyl  10 mg Rectal Once   Chlorhexidine Gluconate Cloth  6 each Topical Daily   docusate sodium  100 mg Oral BID   insulin aspart  0-9 Units Subcutaneous TID WC   mirtazapine  15 mg Oral QHS   polyethylene glycol  17 g Oral BID   sodium chloride flush  3 mL Intravenous Q12H   Continuous Infusions:  sodium chloride       LOS: 7 days    Time spent: 40 minutes    Ramiro Harvest, MD [facility]  If 7PM-7AM, please contact night-coverage [email or web address] 12/20/2018, 9:41 AM

## 2018-12-20 NOTE — Progress Notes (Signed)
Patient blood glucose 64, patient given apple juice and dinner tray. Up to 77 30 minutes after initial 64 reading.

## 2018-12-20 NOTE — Progress Notes (Signed)
Physical Therapy Treatment Patient Details Name: James Gibbs MRN: [id number] DOB: 12-09-1955 Today's Date: 12/20/2018    History of Present Illness 63 y.o. male admitted with GI bleed, syncopal episodes. History of hypertension, hyperlipidemia, mitral valve prolapse,DM II, MS    PT Comments    Pt tolerated ambulation well. He did not c/o lightheadedness/dizziness during session. He is generally weak and unsteady. He plans to d/c home.    Follow Up Recommendations  Home health PT;Supervision - Intermittent(then transition to OP PT)     Equipment Recommendations  Rolling walker with 5" wheels    Recommendations for Other Services       Precautions / Restrictions Precautions Precautions: Fall Restrictions Weight Bearing Restrictions: No    Mobility  Bed Mobility Overal bed mobility: Modified Independent                Transfers Overall transfer level: Needs assistance   Transfers: Sit to/from Stand Sit to Stand: Supervision         General transfer comment: for safety, lines  Ambulation/Gait Ambulation/Gait assistance: Min assist Gait Distance (Feet): 85 Feet Assistive device: IV Pole Gait Pattern/deviations: Step-through pattern;Decreased stride length;Narrow base of support     General Gait Details: unsteady. cues for posture-somewhat crouched gait, BOS. Pt did not c/o lightheadednesss. He tolerated distance well.   Stairs             Wheelchair Mobility    Modified Rankin (Stroke Patients Only)       Balance Overall balance assessment: Needs assistance         Standing balance support: Single extremity supported Standing balance-Leahy Scale: Fair                              Cognition Arousal/Alertness: Awake/alert Behavior During Therapy: WFL for tasks assessed/performed Overall Cognitive Status: Within Functional Limits for tasks assessed                                        Exercises       General Comments        Pertinent Vitals/Pain Pain Assessment: No/denies pain    Home Living                      Prior Function            PT Goals (current goals can now be found in the care plan section) Progress towards PT goals: Progressing toward goals    Frequency    Min 3X/week      PT Plan Current plan remains appropriate    Co-evaluation              AM-PAC PT "6 Clicks" Mobility   Outcome Measure  Help needed turning from your back to your side while in a flat bed without using bedrails?: None Help needed moving from lying on your back to sitting on the side of a flat bed without using bedrails?: None Help needed moving to and from a bed to a chair (including a wheelchair)?: A Little Help needed standing up from a chair using your arms (e.g., wheelchair or bedside chair)?: A Little Help needed to walk in hospital room?: A Little Help needed climbing 3-5 steps with a railing? : A Little 6 Click Score: 20    End of Session Equipment  Utilized During Treatment: Gait belt Activity Tolerance: Patient tolerated treatment well Patient left: in chair;with call bell/phone within reach   PT Visit Diagnosis: Unsteadiness on feet (R26.81);Muscle weakness (generalized) (M62.81)     Time: 8590-9311 PT Time Calculation (min) (ACUTE ONLY): 24 min  Charges:  $Gait Training: 8-22 mins $Therapeutic Activity: 8-22 mins                       Weston Anna, PT Acute Rehabilitation Services Pager: [phone] Office: [phone]

## 2018-12-20 NOTE — Progress Notes (Signed)
Patient passed capsule camera in bedside commode. Dr. Grandville Silos paged.

## 2018-12-20 NOTE — Progress Notes (Signed)
Patient without bowel movements or evidence of overt bleeding.  Feels well.  Vital signs are stable.  Hemoglobin holding steady, with appropriate rise, since Saturday's transfusion.  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  The relevance of this finding to the patient's intermittent bleeding is unclear, since his bleeding seems to have been more acute and abrupt than is normally seen with vascular ectasia.  Impression: Currently quiescent GI bleeding of obscure origin, most likely involving the upper tract of the small bowel in view of the colonoscopic findings which appear to show dark material coming from the ileum, and in view of the fact that the patient's BUN went up during times of bleeding.  Plan: I discussed all of the above with the patient and his daughter, the latter by face time.   We will obtain a CT of the abdomen for to look for anatomic lesions in the liver, pancreas, or small bowel that could be contributing to his bleeding.    Otherwise, I would favor observation for now.    If he does show evidence of further bleeding, the next step would either be repeating his previous studies to look for lesions such as a Dieaulafoy ulcer in the stomach, perhaps a push enteroscopy, or perhaps a single balloon enteroscopy to try to examine larger amounts of the small bowel.  The other alternative would be referral to a [facility].   Cleotis Nipper, M.D. Pager [phone] If no answer or after 5 PM call [phone]

## 2018-12-21 LAB — CBC
HCT: 28.8 % — ABNORMAL LOW (ref 39.0–52.0)
Hemoglobin: 9.1 g/dL — ABNORMAL LOW (ref 13.0–17.0)
MCH: 31 pg (ref 26.0–34.0)
MCHC: 31.6 g/dL (ref 30.0–36.0)
MCV: 98 fL (ref 80.0–100.0)
Platelets: 123 10*3/uL — ABNORMAL LOW (ref 150–400)
RBC: 2.94 MIL/uL — ABNORMAL LOW (ref 4.22–5.81)
RDW: 17.4 % — ABNORMAL HIGH (ref 11.5–15.5)
WBC: 5.3 10*3/uL (ref 4.0–10.5)
nRBC: 0 % (ref 0.0–0.2)

## 2018-12-21 LAB — GLUCOSE, CAPILLARY
Glucose-Capillary: 93 mg/dL (ref 70–99)
Glucose-Capillary: 113 mg/dL — ABNORMAL HIGH (ref 70–99)
Glucose-Capillary: 88 mg/dL (ref 70–99)
Glucose-Capillary: 97 mg/dL (ref 70–99)

## 2018-12-21 LAB — BASIC METABOLIC PANEL
Anion gap: 6 (ref 5–15)
BUN: 7 mg/dL — ABNORMAL LOW (ref 8–23)
CO2: 26 mmol/L (ref 22–32)
Calcium: 8.1 mg/dL — ABNORMAL LOW (ref 8.9–10.3)
Chloride: 107 mmol/L (ref 98–111)
Creatinine, Ser: 1.03 mg/dL (ref 0.61–1.24)
GFR calc Af Amer: >60 mL/min (ref >60)
GFR calc non Af Amer: >60 mL/min (ref >60)
Glucose, Bld: 89 mg/dL (ref 70–99)
Potassium: 3.7 mmol/L (ref 3.5–5.1)
Sodium: 139 mmol/L (ref 135–145)

## 2018-12-21 NOTE — Progress Notes (Signed)
OT Cancellation Note  Patient Details Name: KASIN TONKINSON MRN: [id number] DOB: 23-Oct-1955   Cancelled Treatment:    Reason Eval/Treat Not Completed: Patient at procedure or test/ unavailable- endoscopy. Will check back on pt as schedule allows  Kari Baars, [place] [phone] Office- [phone], Thereasa Parkin 12/21/2018, 1:21 PM

## 2018-12-21 NOTE — Progress Notes (Signed)
PROGRESS NOTE    [name]  ZOX:[id number] DOB: 27-Oct-1955 DOA: 12/13/2018 PCP: Greta Doomapp, Sheila E, MD    Brief Narrative:  HPI per Dr. Nevin BloodgoodSeyed Gibbs on 12/13/2018  Santiago BumpersWilliam A Stewartis a 63 y.o.malewith medical history significant of[age] year old male with past medical history of hypertension, hyperlipidemia, mitral valve prolapse,DM II,presenting with questionable syncope presyncopal episode,noting blood in stool.  Patient Denies having: Fever, Chills, Cough, SOB, Chest Pain, Abd pain, N/V/D, headache, dizziness, lightheadedness, joint pain, rash, open wounds  ED Course: Mildly hypotensive, H&H down from baseline 2 units of packed red blood cell has been ordered pending transfusion GI Dr.Buccinihas been consulted pending evaluation recommendations.  Patient to be admitted for GI evaluation, anemia due to GI bleed blood transfusion  Patient subsequently underwent upper endoscopy on 12/14/2018 with no source of bleeding.   Assessment & Plan:   Principal Problem:   GIB (gastrointestinal bleeding) Active Problems:   Syncope, vasovagal   MITRAL REGURGITATION   HTN (hypertension)   Hypotension   Gout   Acute blood loss anemia  1 vasovagal syncope Patient noted to have a vasovagal syncope during this hospitalization whenever he got up to sit at the bedside commode and was having bloody bowel movements.  Patient has been having vasovagal syncopal episodes since admission with ongoing bloody bowel movements and ongoing bleeding.  Repeat H&H with hemoglobin at 9.1 today.  No further bloody bowel movements noted overnight per RN.  Hopefully bleeding slowing down.  Patient has received 8 units of packed red blood cells during this hospitalization.  Repeat H&H. Continue TED hose, IV fluids.  CT head negative for any acute abnormalities.  Continue to monitor in the ICU.  Per RN patient noted to have some orthostatic blood pressures however remained asymptomatic.  Follow for  now.  GI following.   2.  Acute GI bleed/acute blood loss anemia Patient admitted with syncopal/presyncopal episode noted to be mildly hypotensive on admission with anemia.  Hemoglobin on admission was down to 7.6.  Patient transfused 2 units packed red blood cells.  Hemoglobin noted to be 6.8 the morning of 12/15/2018 and patient transfused another 2 units of packed red blood cells.  Patient with ongoing bloody bowel movements. Patient with large bloody bowel movement the morning of 12/16/2018, per RN with a vasovagal syncopal episode.   Patient seen by GI underwent upper endoscopy with no source of bleeding noted.  Patient with vasovagal syncopal episodes likely attributed to acute GI bleed.  Patient currently in the ICU unit due to ongoing GI bleed and vasovagal syncopal episodes.   Patient subsequently underwent a nuclear medicine GI bleeding scan on 12/15/2018 which showed a small area of increased activity left upper quadrant likely representing very slow hemorrhage within the splenic flexure.  Patient with repeat H&H of 9.1 this morning.  Hemoglobin seems to be stabilizing.  No overt bleeding per RN noted overnight.  Patient has received a total of 8 units packed red blood cells during this hospitalization.  Patient noted to have brown stools yesterday afternoon per RN.  Patient being followed by GI and patient underwent colonoscopy on 12/17/2018 which showed no evidence of colonic diverticula and old blood was noted throughout the colon concern for bleeding proximal to ileocecal valve per GI.  Patient with bloody bowel movements the night of 12/17/2018 in the morning of 12/18/2018. Transfusion goal hemoglobin less than 8.  Patient s/p capsule endoscopy (12/18/2018 ) per GI.  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  Per GI if bleeding recurs then may need referral to tertiary care  center for double-balloon enteroscopy.  GI has ordered repeat CT abdomen and pelvis which showed no acute findings in the abdomen..  Due to patient's ongoing bleeding, vasovagal syncope, now that is post transfusion of 8 units of packed red blood cells, general surgery was consulted for further evaluation and management.  Hemoglobin currently stable at 9.1.  General surgery following.  Per GI.   3.  Mitral regurgitation Stable.  Monitor closely for volume overload as patient with ongoing bleeding requiring transfusion of packed red blood cells.  4.  Mild hypotension Felt secondary to volume depletion in the setting of GI bleed.  Patient's antihypertensive medications of HCTZ and lisinopril held.  Hypotension improved.  Patient's antihypertensive medications have been resumed.  Follow.   5.  Hyperlipidemia Continue statin.  6.  Type 2 diabetes mellitus Oral hypoglycemic agents on hold.  CBG of 93 this morning.  Continue SSI.  7.  Gout Stable.  Continue allopurinol.  8.  Hypokalemia Patient with potassium of 3.7 this morning. Magnesium at 2.2.  9.  Hypertension Blood pressure improved.  Patient resumed back on home regimen of Norvasc 5 mg daily, HCTZ 25 mg daily, 40 mg daily.  Outpatient follow-up.    DVT prophylaxis: SCDs Code Status: Full Family Communication: updated patient.   Disposition Plan: Transfer to telemetry.    Consultants:   Gastroenterology: Dr. Matthias Hughs 12/13/2018  General surgery: Dr. Donell Beers 12/16/2018  Procedures:   Chest x-ray 12/13/2018  Tagged red blood cell scan 12/15/2018  Upper endoscopy 12/14/2018  2 units transfusion packed red blood cells 12/13/2018  2 units transfusion packed red blood cells 12/15/2018--  2 units transfusion packed red blood cells 12/16/2018  2 units transfusion packed red blood cells 12/18/2018  Colonoscopy: Per Dr. Matthias Hughs 12/17/2018  Capsule endoscopy 12/18/2018  DG abdomen 12/20/2018  CT abdomen and pelvis  12/20/2018  Antimicrobials:   None   Subjective: Patient asleep with CPAP on.  Easily arousable.  Denies any chest pain or shortness of breath.  No bloody bowel movements per RN yesterday.  Patient noted to have ambulated with PT yesterday and no further syncopal episodes.   Objective: Vitals:   12/21/18 0400 12/21/18 0425 12/21/18 0600 12/21/18 0800  BP: 133/89  (!) 141/81   Pulse:      Resp: 20  20   Temp: 98.6 F (37 C)   98.4 F (36.9 C)  TempSrc: Oral   Oral  SpO2: 95%     Weight:  96.3 kg    Height:        Intake/Output Summary (Last 24 hours) at 12/21/2018 0911 Last data filed at 12/20/2018 2100 Gross per 24 hour  Intake 240 ml  Output 2075 ml  Net -1835 ml   Filed Weights   12/19/18 0500 12/20/18 0443 12/21/18 0425  Weight: 95.5 kg 96.6 kg 96.3 kg    Examination:  General exam: NAD Respiratory system: Clear to auscultation bilaterally anterior lung fields.  No wheezes, no crackles, no rhonchi.  Normal respiratory effort.  Cardiovascular system: RRR no murmurs rubs or gallops.  No JVD.  No lower extremity edema.   Gastrointestinal system: Abdomen is nontender, nondistended, soft, positive bowel sounds.  No rebound.  No guarding.  Central nervous system: Alert.  Moving extremities spontaneously.  Extremities: Symmetric 5 x 5 power. Skin: No rashes, lesions or ulcers Psychiatry: Judgement  and insight appear normal. Mood & affect appropriate.     Data Reviewed: I have personally reviewed following labs and imaging studies  CBC: Recent Labs  Lab 12/15/18 0145  12/16/18 0209  12/17/18 0213  12/18/18 0224 12/18/18 1035 12/19/18 0245 12/20/18 0237 12/21/18 0231  WBC 10.3  --  8.9  --  7.6  --  7.6  --   --  6.9 5.3  NEUTROABS 8.0*  --   --   --   --   --   --   --   --   --   --   HGB 7.7*   < > 7.1*   < > 8.5*   < > 7.3* 7.1* 10.3* 9.9* 9.1*  HCT 23.9*   < > 22.1*   < > 26.8*   < > 24.2* 22.0* 31.7* 30.6* 28.8*  MCV 102.1*  --  103.8*  --  100.0  --   100.8*  --   --  98.1 98.0  PLT 142*  --  118*  --  105*  --  102*  --   --  108* 123*   < > = values in this interval not displayed.   Basic Metabolic Panel: Recent Labs  Lab 12/15/18 0145  12/17/18 0213 12/18/18 0224 12/19/18 0245 12/20/18 0237 12/21/18 0231  NA 142   < > 147* 144 145 141 139  K 3.5   < > 3.8 3.3* 3.2* 3.5 3.7  CL 110   < > 117* 113* 108 106 107  CO2 21*   < > 21* 25 28 25 26   GLUCOSE 137*   < > 120* 162* 93 90 89  BUN 22   < > 24* 7* 5* 11 7*  CREATININE 0.97   < > 1.03 0.95 0.98 1.14 1.03  CALCIUM 7.7*   < > 7.6* 7.8* 8.1* 8.0* 8.1*  MG 1.8  --   --   --  1.7 2.2  --   PHOS 2.5  --   --   --   --   --   --    < > = values in this interval not displayed.   GFR: Estimated Creatinine Clearance: 85.8 mL/min (by C-G formula based on SCr of 1.03 mg/dL). Liver Function Tests: Recent Labs  Lab 12/15/18 0145  AST 23  ALT 28  ALKPHOS 40  BILITOT 0.6  PROT 4.7*  ALBUMIN 2.9*   No results for input(s): LIPASE, AMYLASE in the last 168 hours. No results for input(s): AMMONIA in the last 168 hours. Coagulation Profile: No results for input(s): INR, PROTIME in the last 168 hours. Cardiac Enzymes: No results for input(s): CKTOTAL, CKMB, CKMBINDEX, TROPONINI in the last 168 hours. BNP (last 3 results) No results for input(s): PROBNP in the last 8760 hours. HbA1C: No results for input(s): HGBA1C in the last 72 hours. CBG: Recent Labs  Lab 12/20/18 1131 12/20/18 1638 12/20/18 1708 12/20/18 2115 12/21/18 0740  GLUCAP 159* 64* 77 83 93   Lipid Profile: No results for input(s): CHOL, HDL, LDLCALC, TRIG, CHOLHDL, LDLDIRECT in the last 72 hours. Thyroid Function Tests: No results for input(s): TSH, T4TOTAL, FREET4, T3FREE, THYROIDAB in the last 72 hours. Anemia Panel: No results for input(s): VITAMINB12, FOLATE, FERRITIN, TIBC, IRON, RETICCTPCT in the last 72 hours. Sepsis Labs: No results for input(s): PROCALCITON, LATICACIDVEN in the last 168  hours.  Recent Results (from the past 240 hour(s))  Urine culture     Status: None  Collection Time: 12/13/18 11:43 AM   Specimen: Urine, Clean Catch  Result Value Ref Range Status   Specimen Description   Final    URINE, CLEAN CATCH Performed at [facility], [address] [place], [place] [address]    Special Requests   Final    Normal Performed at [facility], [address] [place], [place] [address]    Culture   Final    NO GROWTH Performed at [facility], [address] [place], [place] [address]    Report Status 12/14/2018 FINAL  Final  SARS Coronavirus 2 (CEPHEID - Performed in [facility] hospital lab), Hosp Order     Status: None   Collection Time: 12/13/18 12:39 PM   Specimen: Nasopharyngeal Swab  Result Value Ref Range Status   SARS Coronavirus 2 NEGATIVE NEGATIVE Final    Comment: (NOTE) If result is NEGATIVE SARS-CoV-2 target nucleic acids are NOT DETECTED. The SARS-CoV-2 RNA is generally detectable in upper and lower  respiratory specimens during the acute phase of infection. The lowest  concentration of SARS-CoV-2 viral copies this assay can detect is 250  copies / mL. A negative result does not preclude SARS-CoV-2 infection  and should not be used as the sole basis for treatment or other  patient management decisions.  A negative result may occur with  improper specimen collection / handling, submission of specimen other  than nasopharyngeal swab, presence of viral mutation(s) within the  areas targeted by this assay, and inadequate number of viral copies  (<250 copies / mL). A negative result must be combined with clinical  observations, patient history, and epidemiological information. If result is POSITIVE SARS-CoV-2 target nucleic acids are DETECTED. The SARS-CoV-2 RNA is generally detectable in upper and lower  respiratory specimens dur ing the acute phase of infection.  Positive  results are  indicative of active infection with SARS-CoV-2.  Clinical  correlation with patient history and other diagnostic information is  necessary to determine patient infection status.  Positive results do  not rule out bacterial infection or co-infection with other viruses. If result is PRESUMPTIVE POSTIVE SARS-CoV-2 nucleic acids MAY BE PRESENT.   A presumptive positive result was obtained on the submitted specimen  and confirmed on repeat testing.  While 2019 novel coronavirus  (SARS-CoV-2) nucleic acids may be present in the submitted sample  additional confirmatory testing may be necessary for epidemiological  and / or clinical management purposes  to differentiate between  SARS-CoV-2 and other Sarbecovirus currently known to infect humans.  If clinically indicated additional testing with an alternate test  methodology [phone]([id number]) is advised. The SARS-CoV-2 RNA is generally  detectable in upper and lower respiratory sp ecimens during the acute  phase of infection. The expected result is Negative. Fact Sheet for Patients:  BoilerBrush.com.cyhttps://www.fda.gov/media/136312/download Fact Sheet for Healthcare Providers: https://pope.com/https://www.fda.gov/media/136313/download This test is not yet approved or cleared by the [place] FDA and has been authorized for detection and/or diagnosis of SARS-CoV-2 by FDA under an Emergency Use Authorization (EUA).  This EUA will remain in effect (meaning this test can be used) for the duration of the COVID-19 declaration under Section 564(b)(1) of the Act, 21 U.S.C. section 360bbb-3(b)(1), unless the authorization is terminated or revoked sooner. Performed at [facility], [address] [place], [place] [address]   MRSA PCR Screening     Status: None   Collection Time: 12/15/18 12:13 PM   Specimen: Nasal Mucosa; Nasopharyngeal  Result Value Ref  Range Status   MRSA by PCR NEGATIVE NEGATIVE Final    Comment:        The GeneXpert MRSA Assay (FDA approved  for NASAL specimens only), is one component of a comprehensive MRSA colonization surveillance program. It is not intended to diagnose MRSA infection nor to guide or monitor treatment for MRSA infections. Performed at [facility], [place] [address], [place], [place] [address]          Radiology Studies: Dg Abd 1 View  Result Date: 12/20/2018 CLINICAL DATA:  Follow-up on capsule endoscopy EXAM: ABDOMEN - 1 VIEW COMPARISON:  None. FINDINGS: Endoscopy capsule overlies the left lower pelvis, presumably within the sigmoid colon. Nonobstructive bowel gas pattern. Unremarkable colonic stool burden. Nondiagnostic evaluation for pneumoperitoneum secondary to supine positioning and exclusion of the lower thorax. No pneumatosis or portal venous gas. No definitive abnormal intra-abdominal calcifications. Degenerative change of the lower lumbar spine and right pubic symphysis is suspected though incompletely evaluated. Regional soft tissues appear normal. IMPRESSION: Endoscopy capsule overlies the left lower pelvis, presumably within the sigmoid colon. Electronically Signed   By: Sandi Mariscal M.D.   On: 12/20/2018 07:28   Ct Abdomen Pelvis W Contrast  Result Date: 12/20/2018 CLINICAL DATA:  Rectal bleeding for 8 days. Upper GI bleed without clear source on endoscopy. EXAM: CT ABDOMEN AND PELVIS WITH CONTRAST TECHNIQUE: Multidetector CT imaging of the abdomen and pelvis was performed using the standard protocol following bolus administration of intravenous contrast. CONTRAST:  84mL OMNIPAQUE IOHEXOL 300 MG/ML SOLN, 169mL OMNIPAQUE IOHEXOL 300 MG/ML SOLN COMPARISON:  None. FINDINGS: Lower chest:   5 mm lingular nodule identified on 25/6. Hepatobiliary: No suspicious focal abnormality within the liver parenchyma. 4 mm hypodensity in the central right liver is too small to characterize. There is no evidence for gallstones, gallbladder wall thickening, or pericholecystic fluid. No intrahepatic or  extrahepatic biliary dilation. Pancreas: No focal mass lesion. No dilatation of the main duct. No intraparenchymal cyst. No peripancreatic edema. Spleen: No splenomegaly. No focal mass lesion. Adrenals/Urinary Tract: No adrenal nodule or mass. 1.4 cm low-density lesion in the right kidney is likely a cyst. 3.2 cm low-density lesion in the left kidney also likely a cyst. Other scattered low-density lesions in each kidney are too small to characterize. 3 mm nonobstructing stone noted upper pole left kidney. No evidence for hydroureter. The urinary bladder appears normal for the degree of distention. Stomach/Bowel: Stomach is unremarkable. No gastric wall thickening. No evidence of outlet obstruction. Duodenum is normally positioned as is the ligament of Treitz. No small bowel wall thickening. No small bowel dilatation. The terminal ileum is normal. The appendix is normal. No gross colonic mass. No colonic wall thickening. The distal portion of the rectum just prior to the anus has a slightly more redundant course than typically seen, nonspecific. Vascular/Lymphatic: There is abdominal aortic atherosclerosis without aneurysm. There is no gastrohepatic or hepatoduodenal ligament lymphadenopathy. No intraperitoneal or retroperitoneal lymphadenopathy. No pelvic sidewall lymphadenopathy. Reproductive: The prostate gland and seminal vesicles are unremarkable. Other: Small volume free fluid noted in the pelvis. Musculoskeletal: No worrisome lytic or sclerotic osseous abnormality. Degenerative endplate changes seen around the L2-3 and L4-5 disc. Status post umbilical hernia repair. IMPRESSION: 1. No acute findings in the abdomen or pelvis. No specific findings to explain the patient's history of GI bleeding. 2. 5 mm left upper lobe pulmonary nodule. No follow-up needed if patient is low-risk. Non-contrast chest CT can be considered in 12 months if patient is high-risk.  This recommendation follows the consensus statement:  Guidelines for Management of Incidental Pulmonary Nodules Detected on CT Images: From the Fleischner Society 2017; Radiology 2017; 284:228-243. 3.  Aortic Atherosclerois (ICD10-170.0) Electronically Signed   By: Kennith Center M.D.   On: 12/20/2018 15:42        Scheduled Meds:  allopurinol  300 mg Oral Daily   amLODipine  5 mg Oral Daily   atorvastatin  80 mg Oral Daily   bisacodyl  10 mg Rectal Once   Chlorhexidine Gluconate Cloth  6 each Topical Daily   docusate sodium  100 mg Oral BID   hydrochlorothiazide  25 mg Oral Daily   insulin aspart  0-9 Units Subcutaneous TID WC   lisinopril  40 mg Oral Daily   mirtazapine  15 mg Oral QHS   polyethylene glycol  17 g Oral BID   potassium chloride SA  20 mEq Oral Daily   sodium chloride flush  3 mL Intravenous Q12H   Continuous Infusions:  sodium chloride       LOS: 8 days    Time spent: 40 minutes    Ramiro Harvest, MD [facility]  If 7PM-7AM, please contact night-coverage [email or web address] 12/21/2018, 9:11 AM

## 2018-12-21 NOTE — TOC Initial Note (Addendum)
Transition of Care [facility]) - Initial/Assessment Note    Patient Details  Name: James Gibbs MRN: [id number] Date of Birth: Aug 14, 1955  [facility] CM/SW Contact:    Nila Nephew, LCSW Phone Number: [phone] 12/21/2018, 11:46 AM  Clinical Narrative:      Pt admitted with GI bleed, from home where he resides alone. Family nearby. Discussed HHPT/OT recommendation and pt agreeable, provided agency options. [facility] accepted. Pt reports needing walker as well, CSW ordered.       Received voicemail from [facility]- states if contacts at [place] are needed for care planning, pt's social worker is M. Vivia Budge [phone] ex [phone], PCP [facility].      Expected Discharge Plan: [place] Barriers to Discharge: Continued Medical Work up   Patient Goals and CMS Choice Patient states their goals for this hospitalization and ongoing recovery are:: therapy sounds good CMS Medicare.gov Compare Post Acute Care list provided to:: Patient Choice offered to / list presented to : Patient  Expected Discharge Plan and Services Expected Discharge Plan: [place] In-house Referral: Clinical Social Work   Post Acute Care Choice: Durable Medical Equipment, Home Health Living arrangements for the past 2 months: Single Family Home Expected Discharge Date: (unknown)               DME Arranged: [place] Rile DME Agency: [employer] Date DME Agency Contacted: 12/21/18 Time DME Agency Contacted: [phone] Representative spoke with at DME Agency: [place]: PT, OT [place] Agency: [place] ([place]) Date [place]: 12/21/18 Time [place]: 6767 Representative spoke with at [place]: Santiago Glad  Prior Living Arrangements/Services Living arrangements for the past 2 months: Single Family Home Lives with:: Self Patient language and need for interpreter reviewed:: No Do you feel safe going back to the place where you  live?: Yes      Need for Family Participation in Patient Care: No (Comment) Care giver support system in place?: No (comment)   Criminal Activity/Legal Involvement Pertinent to Current Situation/Hospitalization: No - Comment as needed  Activities of Daily Living Home Assistive Devices/Equipment: CBG Meter, Eyeglasses ADL Screening (condition at time of admission) Patient's cognitive ability adequate to safely complete daily activities?: Yes Is the patient deaf or have difficulty hearing?: No Does the patient have difficulty seeing, even when wearing glasses/contacts?: No Does the patient have difficulty concentrating, remembering, or making decisions?: No Patient able to express need for assistance with ADLs?: Yes Does the patient have difficulty dressing or bathing?: No Independently performs ADLs?: Yes (appropriate for developmental age) Does the patient have difficulty walking or climbing stairs?: Yes(secondary to wekaness) Weakness of Legs: Both Weakness of Arms/Hands: None  Permission Sought/Granted                  Emotional Assessment Appearance:: Appears stated age Attitude/Demeanor/Rapport: Engaged Affect (typically observed): Accepting Orientation: : Oriented to Self, Oriented to Place, Oriented to Situation Alcohol / Substance Use: Not Applicable Psych Involvement: No (comment)  Admission diagnosis:  Syncope; GI Bleed; Weakness Patient Active Problem List   Diagnosis Date Noted  . Acute blood loss anemia   . GIB (gastrointestinal bleeding) 12/13/2018  . HTN (hypertension) 12/13/2018  . Syncope, vasovagal 12/13/2018  . Hypotension 12/13/2018  . Gout 12/13/2018  . HYPERTENSION, UNCONTROLLED 06/04/2009  . OBESITY, UNSPECIFIED 08/15/2008  . MITRAL REGURGITATION 08/15/2008  . MITRAL VALVE PROLAPSE 08/15/2008   PCP:  Zachery Dauer, MD Pharmacy:  [facility] [address] [place] ([address]), [place] - [address] [place]  [address] Phone: [phone] Fax: [phone]     Social Determinants of Health (SDOH) Interventions    Readmission Risk Interventions No flowsheet data found.

## 2018-12-21 NOTE — Progress Notes (Signed)
Nonbloody BM this a.m.  Pt feels subjectively "healed."  BUN down to 7, although Hgb has drifted down 9.9 to 9.1.  IMPR:  Quiescent GIB of ? Origin  RECOMM:    1. Have advanced to solid diet 2. OK to go to floor bed at discretion of Hospitalist physician  Cleotis Nipper, M.D. Pager [phone] If no answer or after 5 PM call [phone]

## 2018-12-22 LAB — CBC
HCT: 30.1 % — ABNORMAL LOW (ref 39.0–52.0)
Hemoglobin: 9.6 g/dL — ABNORMAL LOW (ref 13.0–17.0)
MCH: 31.6 pg (ref 26.0–34.0)
MCHC: 31.9 g/dL (ref 30.0–36.0)
MCV: 99 fL (ref 80.0–100.0)
Platelets: 110 10*3/uL — ABNORMAL LOW (ref 150–400)
RBC: 3.04 MIL/uL — ABNORMAL LOW (ref 4.22–5.81)
RDW: 17.1 % — ABNORMAL HIGH (ref 11.5–15.5)
WBC: 5.6 10*3/uL (ref 4.0–10.5)
nRBC: 0 % (ref 0.0–0.2)

## 2018-12-22 LAB — BASIC METABOLIC PANEL
Anion gap: 8 (ref 5–15)
BUN: 8 mg/dL (ref 8–23)
CO2: 26 mmol/L (ref 22–32)
Calcium: 7.9 mg/dL — ABNORMAL LOW (ref 8.9–10.3)
Chloride: 107 mmol/L (ref 98–111)
Creatinine, Ser: 1.07 mg/dL (ref 0.61–1.24)
GFR calc Af Amer: >60 mL/min (ref >60)
GFR calc non Af Amer: >60 mL/min (ref >60)
Glucose, Bld: 89 mg/dL (ref 70–99)
Potassium: 3.6 mmol/L (ref 3.5–5.1)
Sodium: 141 mmol/L (ref 135–145)

## 2018-12-22 LAB — GLUCOSE, CAPILLARY: Glucose-Capillary: 147 mg/dL — ABNORMAL HIGH (ref 70–99)

## 2018-12-22 MED ORDER — POTASSIUM CHLORIDE CRYS ER 20 MEQ PO TBCR: 20.0000 meq | EXTENDED_RELEASE_TABLET | Freq: Every day | ORAL | Status: DC

## 2018-12-22 MED ORDER — SENNOSIDES-DOCUSATE SODIUM 8.6-50 MG PO TABS: 1.0000 | ORAL_TABLET | Freq: Two times a day (BID) | ORAL | Status: DC

## 2018-12-22 MED ORDER — POTASSIUM CHLORIDE CRYS ER 20 MEQ PO TBCR
40.0000 meq | EXTENDED_RELEASE_TABLET | Freq: Once | ORAL | Status: AC
  Administered 2018-12-22: 40 meq via ORAL
  Filled 2018-12-22: qty 2

## 2018-12-22 MED ORDER — PANTOPRAZOLE SODIUM 40 MG PO TBEC: 40.0000 mg | DELAYED_RELEASE_TABLET | Freq: Every day | ORAL | 1 refills | Status: DC

## 2018-12-22 MED ORDER — POLYETHYLENE GLYCOL 3350 17 G PO PACK: 17.0000 g | PACK | Freq: Two times a day (BID) | ORAL | 0 refills | Status: DC

## 2018-12-22 NOTE — Progress Notes (Signed)
Occupational Therapy Treatment Patient Details Name: James LittenWilliam A Gibbs MRN: [id number] DOB: June 05, 1955 Today's Date: 12/22/2018    History of present illness 63 y.o. male admitted with GI bleed, syncopal episodes. History of hypertension, hyperlipidemia, mitral valve prolapse,DM II, MS   OT comments  Pt performed ADL at sink.  Pt very motivated! Educated pt in sitting when he needed a rest and energy conservation strategies  Follow Up Recommendations  Home health OT;Supervision - Intermittent    Equipment Recommendations  None recommended by OT    Recommendations for Other Services PT consult    Precautions / Restrictions Precautions Precautions: Fall Restrictions Weight Bearing Restrictions: No       Mobility Bed Mobility Overal bed mobility: Modified Independent             General bed mobility comments: Pt OOB  Transfers Overall transfer level: Needs assistance Equipment used: Rolling walker (2 wheeled) Transfers: Sit to/from [employer] CorporationStand;Stand Pivot Transfers Sit to Stand: Min guard Stand pivot transfers: Min guard       General transfer comment: close guard for safety, lines.    Balance Overall balance assessment: Needs assistance         Standing balance support: Bilateral upper extremity supported Standing balance-Leahy Scale: Fair                             ADL either performed or assessed with clinical judgement   ADL Overall ADL's : Needs assistance/impaired     Grooming: Wash/dry face;Oral care;Wash/dry hands;Standing   Upper Body Bathing: Set up;Sitting   Lower Body Bathing: Moderate assistance;Sit to/from stand;Cueing for compensatory techniques;Cueing for safety;Cueing for sequencing   Upper Body Dressing : Set up;Sitting   Lower Body Dressing: Moderate assistance;Sit to/from stand;Cueing for safety;Cueing for sequencing       Toileting- Clothing Manipulation and Hygiene: Minimal assistance;Cueing for safety;Cueing for  sequencing       Functional mobility during ADLs: Min guard General ADL Comments: pt reports daugther will A him as needed               Cognition Arousal/Alertness: Awake/alert Behavior During Therapy: WFL for tasks assessed/performed Overall Cognitive Status: Within Functional Limits for tasks assessed                                                     Pertinent Vitals/ Pain       Pain Assessment: No/denies pain     Prior Functioning/Environment              Frequency  Min 2X/week        Progress Toward Goals  OT Goals(current goals can now be found in the care plan section)  Progress towards OT goals: Progressing toward goals     Plan Discharge plan remains appropriate       AM-PAC OT "6 Clicks" Daily Activity     Outcome Measure   Help from another person eating meals?: None Help from another person taking care of personal grooming?: None Help from another person toileting, which includes using toliet, bedpan, or urinal?: A Little Help from another person bathing (including washing, rinsing, drying)?: A Little Help from another person to put on and taking off regular upper body clothing?: None Help from another person to put on and taking off  regular lower body clothing?: A Little 6 Click Score: 21    End of Session Equipment Utilized During Treatment: Rolling walker  OT Visit Diagnosis: Unsteadiness on feet (R26.81);Muscle weakness (generalized) (M62.81)   Activity Tolerance Patient tolerated treatment well   Patient Left in chair;with call bell/phone within reach   Nurse Communication          Time: 4403-4742 OT Time Calculation (min): 26 min  Charges: OT General Charges $OT Visit: 1 Visit OT Treatments $Self Care/Home Management : 23-37 mins  Kari Baars, [place] [phone] Office- [place], Edwena Felty D 12/22/2018, 4:50 PM

## 2018-12-22 NOTE — Discharge Summary (Signed)
Physician Discharge Summary  Harless LittenWilliam A Tino ZOX:[id number] DOB: [birth year] DOA: 12/13/2018  PCP: Greta Doomapp, Sheila E, MD  Admit date: 12/13/2018 Discharge date: 12/22/2018  Time spent: 55 minutes  Recommendations for Outpatient Follow-up:  1. Follow-up with Celso Amyina Garrett, PA gastroenterology 01/05/2019 at 9:45 AM. 2. Follow-up with Dr. Matthias HughsBuccini, gastroenterology 02/15/2019 at 10:45 AM 3. Follow-up with Tapp, Karen ChafeSheila E, MD in 1 to 2 weeks.  On follow-up patient will need a CBC done to follow-up on H&H.  Patient also need a basic metabolic profile done to follow-up on electrolytes and renal function.  Patient blood pressure need to be reassessed on follow-up.   Discharge Diagnoses:  Principal Problem:   GIB (gastrointestinal bleeding) Active Problems:   Syncope, vasovagal   MITRAL REGURGITATION   HTN (hypertension)   Hypotension   Gout   Acute blood loss anemia   Discharge Condition: Stable and improved.  Diet recommendation: Regular.  Filed Weights   12/20/18 0443 12/21/18 0425 12/22/18 0500  Weight: 96.6 kg 96.3 kg 96 kg    History of present illness:  Per Dr. Juleen StarrShahmehdi Tajon A Aldaba is a 63 y.o. male with medical history significant of 63 year old male with past medical history of hypertension, hyperlipidemia, mitral valve prolapse,DM II,  presenting with questionable syncope presyncopal episode, noting blood in stool.  Patient Denies having: Fever, Chills, Cough, SOB, Chest Pain, Abd pain, N/V/D, headache, dizziness, lightheadedness, joint pain, rash, open wounds  ED Course:   Mildly hypotensive, H&H down from baseline 2 units of packed red blood cell has been ordered pending transfusion GI Dr. Matthias HughsBuccini has been consulted pending evaluation recommendations.  Patient to be admitted for GI evaluation, anemia due to GI bleed blood transfusion  Hospital Course:  1 vasovagal syncope Patient noted to have a vasovagal syncope during this hospitalization whenever he got up to  sit at the bedside commode and was having bloody bowel movements.  Patient has been having vasovagal syncopal episodes since admission with ongoing bloody bowel movements and ongoing bleeding.  No further bloody bowel movements noted overnight per RN.  Patient s/p 8 units of packed red blood cells during this hospitalization.  Repeat H&H.  Patient placed on TED hose and IV fluids.  CT head negative for any acute abnormalities.  Per RN patient noted to have some orthostatic blood pressures however remained asymptomatic.    Patient improved clinically.  Did not have any further syncopal episodes.  Patient was discharged home in stable and improved condition.   2.  Acute GI bleed/acute blood loss anemia Patient admitted with syncopal/presyncopal episode noted to be mildly hypotensive on admission with anemia.  Hemoglobin on admission was down to 7.6.  Patient transfused 2 units packed red blood cells.  Hemoglobin noted to be 6.8 the morning of 12/15/2018 and patient transfused another 2 units of packed red blood cells.  Patient with ongoing bloody bowel movements early on during the hospitalization. Patient with large bloody bowel movement the morning of 12/16/2018, per RN with a vasovagal syncopal episode.  Patient subsequently transferred to the ICU.  Patient seen by GI underwent upper endoscopy with no source of bleeding noted.  Patient with vasovagal syncopal episodes likely attributed to acute GI bleed.  Patient subsequently underwent a nuclear medicine GI bleeding scan on 12/15/2018 which showed a small area of increased activity left upper quadrant likely representing very slow hemorrhage within the splenic flexure.  Patient with repeat H&H of 9.1 this morning.  Hemoglobin stabilized.  No overt bleeding per RN noted  overnight.  Patient received a total of 8 units packed red blood cells during this hospitalization.  Patient noted to have brown stools for 2 to 3 days prior to discharge.   Patient being  followed by GI and patient underwent colonoscopy on 12/17/2018 which showed no evidence of colonic diverticula and old blood was noted throughout the colon concern for bleeding proximal to ileocecal valve per GI.  Patient with bloody bowel movements the night of 12/17/2018 in the morning of 12/18/2018. Transfusion goal hemoglobin less than 8.  Patient s/p capsule endoscopy (12/18/2018 ) per GI.  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  Per GI if bleeding recurs then may need referral to tertiary care center for double-balloon enteroscopy.  GI has ordered repeat CT abdomen and pelvis which showed no acute findings in the abdomen..  Due to patient's ongoing bleeding, vasovagal syncope, now that is post transfusion of 8 units of packed red blood cells, general surgery was consulted for further evaluation and management.  Patient improved clinically.  Patient was seen by PT.  GI recommended Protonix 40 mg daily for 1 month. Hemoglobin stable at 9.6 by day of discharge.  Close outpatient follow-up with GI.   3.  Mitral regurgitation Remained stable throughout the hospitalization.   4.  Mild hypotension Felt secondary to volume depletion in the setting of GI bleed.  Patient's antihypertensive medications of HCTZ and lisinopril held initially.  As patient's hypotension improved and had resolved patient's antihypertensive medications were resumed.  Hypotension had resolved by day of discharge.  5.  Hyperlipidemia Patient maintained on statin.   6.  Type 2 diabetes mellitus Oral hypoglycemic agents were held during the hospitalization.  Patient maintained on sliding scale insulin.  Patient's oral hypoglycemic agents will be resumed on discharge.  Outpatient follow-up.   7.  Gout Patient maintained on home regimen of allopurinol.  Outpatient follow-up.   8.  Hypokalemia Repleted.   Outpatient follow-up.    9.  Hypertension Patient's antihypertensive medications were initially held during the hospitalization due to hypotension and borderline blood pressure as well as GI bleed.  As GI bleed improved and subsided and hypotension improved patient's antihypertensive regimen was resumed and patient placed back on home regimen of Norvasc, HCTZ, lisinopril.  Outpatient follow-up.     Procedures:  Chest x-ray 12/13/2018  Tagged red blood cell scan 12/15/2018  Upper endoscopy 12/14/2018  2 units transfusion packed red blood cells 12/13/2018  2 units transfusion packed red blood cells 12/15/2018--  2 units transfusion packed red blood cells 12/16/2018  2 units transfusion packed red blood cells 12/18/2018  Colonoscopy: Per Dr. Matthias Hughs 12/17/2018  Capsule endoscopy 12/18/2018  DG abdomen 12/20/2018  CT abdomen and pelvis 12/20/2018  Consultations:  Gastroenterology: Dr. Matthias Hughs 12/13/2018  General surgery: Dr. Donell Beers 12/16/2018  Discharge Exam: Vitals:   12/22/18 1500 12/22/18 1600  BP: (!) 154/98   Pulse: 82   Resp: 16 (!) 24  Temp:    SpO2: 100%     General: NAD Cardiovascular: RRR Respiratory: CTAB  Discharge Instructions   Discharge Instructions    Diet general   Complete by: As directed    Increase activity slowly   Complete by: As directed      Allergies as of 12/22/2018   No Known Allergies     Medication List    STOP taking these medications   aspirin 81 MG tablet  TAKE these medications   allopurinol 300 MG tablet Commonly known as: ZYLOPRIM Take 300 mg by mouth daily.   amLODipine 10 MG tablet Commonly known as: NORVASC Take 10 mg by mouth daily.   atorvastatin 80 MG tablet Commonly known as: LIPITOR Take 80 mg by mouth daily.   cholecalciferol 10 MCG (400 UNIT) Tabs tablet Commonly known as: VITAMIN D3 Take 400 Units by mouth daily.   Fish Oil 1000 MG Caps Take 3,000 mg by mouth.   lisinopril 40 MG tablet Commonly  known as: ZESTRIL Take 40 mg by mouth daily.   METFORMIN HCL PO Take 500 mg by mouth daily.   MICROZIDE PO Take 25 mg by mouth daily.   pantoprazole 40 MG tablet Commonly known as: Protonix Take 1 tablet (40 mg total) by mouth daily.   polyethylene glycol 17 g packet Commonly known as: MIRALAX / GLYCOLAX Take 17 g by mouth 2 (two) times daily.   potassium chloride SA 20 MEQ tablet Commonly known as: K-DUR Take 20 mEq by mouth daily.   senna-docusate 8.6-50 MG tablet Commonly known as: Senokot-S Take 1 tablet by mouth 2 (two) times daily.   sildenafil 100 MG tablet Commonly known as: VIAGRA Take 100 mg by mouth daily as needed for erectile dysfunction.   Tecfidera 240 MG Cpdr Generic drug: Dimethyl Fumarate Take 240 mg by mouth 2 (two) times daily.   vitamin C 100 MG tablet Take 100 mg by mouth daily.            Durable Medical Equipment  (From admission, onward)         Start     Ordered   12/22/18 1534  For home use only DME 4 wheeled rolling walker with seat  Once    Question Answer Comment  Patient needs a walker to treat with the following condition Debility   Patient needs a walker to treat with the following condition MS (multiple sclerosis) (HCC)      12/22/18 1534         No Known Allergies Follow-up Information    Celso AmyGarrett, Tina, PA-C Follow up on 01/05/2019.   Specialty: Physician Assistant Why: f/u at 945am Contact information: [address] [place] [address] [phone]        Bernette RedbirdBuccini, Robert, MD Follow up on 02/15/2019.   Specialty: Gastroenterology Why: f/u at 1045am Contact information: [address] [place] [address] [phone]        Greta Doomapp, Sheila E, MD. Schedule an appointment as soon as possible for a visit in 1 week(s).   Specialty: Internal Medicine Why: f/u in 1-2 weeks. Contact information: [address] [place] [address] [phone]            The results of  significant diagnostics from this hospitalization (including imaging, microbiology, ancillary and laboratory) are listed below for reference.    Significant Diagnostic Studies: Dg Abd 1 View  Result Date: 12/20/2018 CLINICAL DATA:  Follow-up on capsule endoscopy EXAM: ABDOMEN - 1 VIEW COMPARISON:  None. FINDINGS: Endoscopy capsule overlies the left lower pelvis, presumably within the sigmoid colon. Nonobstructive bowel gas pattern. Unremarkable colonic stool burden. Nondiagnostic evaluation for pneumoperitoneum secondary to supine positioning and exclusion of the lower thorax. No pneumatosis or portal venous gas. No definitive abnormal intra-abdominal calcifications. Degenerative change of the lower lumbar spine and right pubic symphysis is suspected though incompletely evaluated. Regional soft tissues appear normal. IMPRESSION: Endoscopy capsule overlies the left lower pelvis, presumably  within the sigmoid colon. Electronically Signed   By: Simonne ComeJohn  Watts M.D.   On: 12/20/2018 07:28   Ct Head Wo Contrast  Result Date: 12/16/2018 CLINICAL DATA:  63 year old male with past medical history of hypertension, hyperlipidemia, mitral valve prolapse,DM II, presenting with questionable syncope presyncopal episode, noting blood in stool." EXAM: CT HEAD WITHOUT CONTRAST TECHNIQUE: Contiguous axial images were obtained from the base of the skull through the vertex without intravenous contrast. COMPARISON:  None. FINDINGS: Brain: No evidence of acute infarction, hemorrhage, hydrocephalus, extra-axial collection or mass lesion/mass effect. There is ventricular and sulcal enlargement reflecting mild to moderate generalized atrophy, advanced for age. Patchy areas of white matter hypoattenuation consistent with moderate chronic microvascular ischemic change. There are small old basal ganglia and deep white matter lacunar infarcts. Vascular: No hyperdense vessel or unexpected calcification. Skull: Normal. Negative for fracture  or focal lesion. Sinuses/Orbits: Globes and orbits are unremarkable. The visualized sinuses and mastoid air cells are clear. Other: None. IMPRESSION: 1. No acute intracranial abnormalities. 2. Atrophy advanced for age. 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 Electronically Signed   By: Amie Portlandavid  Ormond M.D.   On: 12/16/2018 09:17   Nm Gi Blood Loss  Result Date: 12/15/2018 CLINICAL DATA:  Diverticular hemorrhage, last active hemorrhage was 1 day ago EXAM: NUCLEAR MEDICINE GASTROINTESTINAL BLEEDING SCAN TECHNIQUE: Sequential abdominal images were obtained following intravenous administration of Tc-2220m labeled red blood cells. RADIOPHARMACEUTICALS:  22.9 mCi Tc-5820m pertechnetate in-vitro labeled red cells. COMPARISON:  None. FINDINGS: There is adequate uptake throughout the liver and cardiac blood pool and vasculature. Pooling in the urinary bladder with some bony uptake is noted. The first 60 minutes of imaging were significantly limited due to patient motion artifact. Imaging to 2 hours was then performed and demonstrates a small focal area of increased activity in the left upper quadrant below the location of the spleen. This stays in the left upper quadrant without significant mobility. This may represent a very slow diverticular hemorrhage in the region of the splenic flexure. No other focal area of increased uptake is noted to suggest active hemorrhage. IMPRESSION:.: IMPRESSION:. Small area of increased activity in the left upper quadrant likely representing a very slow hemorrhage within the splenic flexure. Direct visualization may be helpful Electronically Signed   By: Alcide CleverMark  Lukens M.D.   On: 12/15/2018 19:09   Ct Abdomen Pelvis W Contrast  Result Date: 12/20/2018 CLINICAL DATA:  Rectal bleeding for 8 days. Upper GI bleed without clear source on endoscopy. EXAM: CT ABDOMEN AND PELVIS WITH CONTRAST TECHNIQUE: Multidetector CT  imaging of the abdomen and pelvis was performed using the standard protocol following bolus administration of intravenous contrast. CONTRAST:  30mL OMNIPAQUE IOHEXOL 300 MG/ML SOLN, 100mL OMNIPAQUE IOHEXOL 300 MG/ML SOLN COMPARISON:  None. FINDINGS: Lower chest:   5 mm lingular nodule identified on 25/6. Hepatobiliary: No suspicious focal abnormality within the liver parenchyma. 4 mm hypodensity in the central right liver is too small to characterize. There is no evidence for gallstones, gallbladder wall thickening, or pericholecystic fluid. No intrahepatic or extrahepatic biliary dilation. Pancreas: No focal mass lesion. No dilatation of the main duct. No intraparenchymal cyst. No peripancreatic edema. Spleen: No splenomegaly. No focal mass lesion. Adrenals/Urinary Tract: No adrenal nodule or mass. 1.4 cm low-density lesion in the right kidney is likely a cyst. 3.2 cm low-density lesion in the left kidney also likely a cyst. Other scattered low-density lesions in each kidney are too small  to characterize. 3 mm nonobstructing stone noted upper pole left kidney. No evidence for hydroureter. The urinary bladder appears normal for the degree of distention. Stomach/Bowel: Stomach is unremarkable. No gastric wall thickening. No evidence of outlet obstruction. Duodenum is normally positioned as is the ligament of Treitz. No small bowel wall thickening. No small bowel dilatation. The terminal ileum is normal. The appendix is normal. No gross colonic mass. No colonic wall thickening. The distal portion of the rectum just prior to the anus has a slightly more redundant course than typically seen, nonspecific. Vascular/Lymphatic: There is abdominal aortic atherosclerosis without aneurysm. There is no gastrohepatic or hepatoduodenal ligament lymphadenopathy. No intraperitoneal or retroperitoneal lymphadenopathy. No pelvic sidewall lymphadenopathy. Reproductive: The prostate gland and seminal vesicles are unremarkable. Other:  Small volume free fluid noted in the pelvis. Musculoskeletal: No worrisome lytic or sclerotic osseous abnormality. Degenerative endplate changes seen around the L2-3 and L4-5 disc. Status post umbilical hernia repair. IMPRESSION: 1. No acute findings in the abdomen or pelvis. No specific findings to explain the patient's history of GI bleeding. 2. 5 mm left upper lobe pulmonary nodule. No follow-up needed if patient is low-risk. Non-contrast chest CT can be considered in 12 months if patient is high-risk. This recommendation follows the consensus statement: Guidelines for Management of Incidental Pulmonary Nodules Detected on CT Images: From the Fleischner Society 2017; Radiology 2017; 284:228-243. 3.  Aortic Atherosclerois (ICD10-170.0) Electronically Signed   By: Misty Stanley M.D.   On: 12/20/2018 15:42   Dg Chest Portable 1 View  Result Date: 12/13/2018 CLINICAL DATA:  Syncopal episode today. EXAM: PORTABLE CHEST 1 VIEW COMPARISON:  05/09/2015. FINDINGS: Normal sized heart. Tortuous aorta. Clear lungs with normal vascularity. Thoracic spine degenerative changes. No fracture or pneumothorax seen. IMPRESSION: No acute abnormality. Electronically Signed   By: Claudie Revering M.D.   On: 12/13/2018 11:57    Microbiology: Recent Results (from the past 240 hour(s))  Urine culture     Status: None   Collection Time: 12/13/18 11:43 AM   Specimen: Urine, Clean Catch  Result Value Ref Range Status   Specimen Description   Final    URINE, CLEAN CATCH Performed at [facility], [place] [address] [place], [place] [address]    Special Requests   Final    Normal Performed at [facility], [place] [address] [place], [place] [address]    Culture   Final    NO GROWTH Performed at [facility], [place] [address], [place], [place] [address]    Report Status 12/14/2018 FINAL  Final  SARS Coronavirus 2 (CEPHEID - Performed in [place] hospital lab), Hosp Order     Status:  None   Collection Time: 12/13/18 12:39 PM   Specimen: Nasopharyngeal Swab  Result Value Ref Range Status   SARS Coronavirus 2 NEGATIVE NEGATIVE Final    Comment: (NOTE) If result is NEGATIVE SARS-CoV-2 target nucleic acids are NOT DETECTED. The SARS-CoV-2 RNA is generally detectable in upper and lower  respiratory specimens during the acute phase of infection. The lowest  concentration of SARS-CoV-2 viral copies this assay can detect is 250  copies / mL. A negative result does not preclude SARS-CoV-2 infection  and should not be used as the sole basis for treatment or other  patient management decisions.  A negative result may occur with  improper specimen collection / handling, submission of specimen other  than nasopharyngeal swab, presence of viral mutation(s) within the  areas targeted by this  assay, and inadequate number of viral copies  (<250 copies / mL). A negative result must be combined with clinical  observations, patient history, and epidemiological information. If result is POSITIVE SARS-CoV-2 target nucleic acids are DETECTED. The SARS-CoV-2 RNA is generally detectable in upper and lower  respiratory specimens dur ing the acute phase of infection.  Positive  results are indicative of active infection with SARS-CoV-2.  Clinical  correlation with patient history and other diagnostic information is  necessary to determine patient infection status.  Positive results do  not rule out bacterial infection or co-infection with other viruses. If result is PRESUMPTIVE POSTIVE SARS-CoV-2 nucleic acids MAY BE PRESENT.   A presumptive positive result was obtained on the submitted specimen  and confirmed on repeat testing.  While 2019 novel coronavirus  (SARS-CoV-2) nucleic acids may be present in the submitted sample  additional confirmatory testing may be necessary for epidemiological  and / or clinical management purposes  to differentiate between  SARS-CoV-2 and other  Sarbecovirus currently known to infect humans.  If clinically indicated additional testing with an alternate test  methodology [phone]) is advised. The SARS-CoV-2 RNA is generally  detectable in upper and lower respiratory sp ecimens during the acute  phase of infection. The expected result is Negative. Fact Sheet for Patients:  BoilerBrush.com.cy Fact Sheet for Healthcare Providers: https://pope.com/ This test is not yet approved or cleared by the [place] FDA and has been authorized for detection and/or diagnosis of SARS-CoV-2 by FDA under an Emergency Use Authorization (EUA).  This EUA will remain in effect (meaning this test can be used) for the duration of the COVID-19 declaration under Section 564(b)(1) of the Act, 21 U.S.C. section 360bbb-3(b)(1), unless the authorization is terminated or revoked sooner. Performed at [facility], [address], [place], [place] [address]   MRSA PCR Screening     Status: None   Collection Time: 12/15/18 12:13 PM   Specimen: Nasal Mucosa; Nasopharyngeal  Result Value Ref Range Status   MRSA by PCR NEGATIVE NEGATIVE Final    Comment:        The GeneXpert MRSA Assay (FDA approved for NASAL specimens only), is one component of a comprehensive MRSA colonization surveillance program. It is not intended to diagnose MRSA infection nor to guide or monitor treatment for MRSA infections. Performed at [facility], [address], [place], [place] [address]      Labs: Basic Metabolic Panel: Recent Labs  Lab 12/18/18 0224 12/19/18 0245 12/20/18 0237 12/21/18 0231 12/22/18 0238  NA 144 145 141 139 141  K 3.3* 3.2* 3.5 3.7 3.6  CL 113* 108 106 107 107  CO2 GLUCOSE 162* 93 90 89 89  BUN 7* 5* 11 7* 8  CREATININE 0.95 0.98 1.14 1.03 1.07  CALCIUM 7.8* 8.1* 8.0* 8.1* 7.9*  MG  --  1.7 2.2  --   --    Liver Function Tests: No  results for input(s): AST, ALT, ALKPHOS, BILITOT, PROT, ALBUMIN in the last 168 hours. No results for input(s): LIPASE, AMYLASE in the last 168 hours. No results for input(s): AMMONIA in the last 168 hours. CBC: Recent Labs  Lab 12/17/18 0213  12/18/18 0224 12/18/18 1035 12/19/18 0245 12/20/18 0237 12/21/18 0231 12/22/18 0238  WBC 7.6  --  7.6  --   --  6.9 5.3 5.6  HGB 8.5*   < > 7.3* 7.1* 10.3* 9.9* 9.1* 9.6*  HCT 26.8*   < >  24.2* 22.0* 31.7* 30.6* 28.8* 30.1*  MCV 100.0  --  100.8*  --   --  98.1 98.0 99.0  PLT 105*  --  102*  --   --  108* 123* 110*   < > = values in this interval not displayed.   Cardiac Enzymes: No results for input(s): CKTOTAL, CKMB, CKMBINDEX, TROPONINI in the last 168 hours. BNP: BNP (last 3 results) Recent Labs    12/13/18 2130  BNP 16.5    ProBNP (last 3 results) No results for input(s): PROBNP in the last 8760 hours.  CBG: Recent Labs  Lab 12/21/18 0740 12/21/18 1138 12/21/18 1610 12/21/18 2125 12/22/18 1120  GLUCAP 93 113* 88 97 147*       Signed:  Ramiro Harvest MD.  [facility] 12/22/2018, 4:57 PM

## 2018-12-22 NOTE — Progress Notes (Signed)
Patient feels well.  He was up and walking short distances yesterday without orthostasis, although it is recalled he has a history of multiple sclerosis with some balance difficulties.  No bowel movement since yesterday.  Hemoglobin stable, actually up slightly at 9.6.  BUN remains low at 8.  Impression: Quiescent GI bleed of unclear origin.  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  Plan:  1.  Okay for discharge from GI standpoint, but would recommend PT evaluation to confirm sufficient gait stability for safely going home, prior to discharge  2.  Have made patient follow-up appointment with the physician assistant in our office for August 12 (we will plan to obtain a CBC on the morning of that appointment), and a follow-up appointment with me for September 22.  The patient was given a card with directions to our office as well as the specific times for the appointments.  3.  Would empirically cover the patient with any PPI, once daily, for a month following discharge, just in case there was some unseen ulcer that led to his bleeding.  4.  Would not resume aspirin, in the absence of a strong indication for him being on it, at least for the next month or so, because of concerns that it could either trigger or exacerbate a recurrent bleed.  Since we do not know the origin of this patient's bleeding, the patient's prognosis for rebleeding is not clear.  I suggested to the patient that he have his primary physician eventually analyze his cardiovascular risk factors to determine whether resumption of aspirin is clinically appropriate.  5.  No dietary or activity restrictions, although I did explain to the patient that he will find that his endurance is somewhat diminished because of his anemia.  6.  I would probably not start oral iron therapy prior to discharge, because it  would potentially lead to dark stools which may confuse the issue of recurrent GI bleeding.  Depending on how his hemoglobin looks when we see him back in the office in a couple of weeks, we might consider starting low-dose iron supplementation at that time.  Case discussed in detail with Dr. Irine Seal, also spoke with the patient's daughter on the phone.  Cleotis Nipper, M.D. Pager [phone] If no answer or after 5 PM call [phone]

## 2018-12-22 NOTE — Progress Notes (Addendum)
PROGRESS NOTE    James Gibbs  GNF:[id number] DOB: [birth year] DOA: 12/13/2018 PCP: Greta Doomapp, Sheila E, MD    Brief Narrative:  HPI per Dr. Nevin BloodgoodSeyed Shahmehdi on 12/13/2018  James BumpersWilliam A Stewartis a 63 y.o.malewith medical history significant of[age] year old male with past medical history of hypertension, hyperlipidemia, mitral valve prolapse,DM II,presenting with questionable syncope presyncopal episode,noting blood in stool.  Patient Denies having: Fever, Chills, Cough, SOB, Chest Pain, Abd pain, N/V/D, headache, dizziness, lightheadedness, joint pain, rash, open wounds  ED Course: Mildly hypotensive, H&H down from baseline 2 units of packed red blood cell has been ordered pending transfusion GI Dr.Buccinihas been consulted pending evaluation recommendations.  Patient to be admitted for GI evaluation, anemia due to GI bleed blood transfusion  Patient subsequently underwent upper endoscopy on 12/14/2018 with no source of bleeding.   Assessment & Plan:   Principal Problem:   GIB (gastrointestinal bleeding) Active Problems:   Syncope, vasovagal   MITRAL REGURGITATION   HTN (hypertension)   Hypotension   Gout   Acute blood loss anemia  1 vasovagal syncope Patient noted to have a vasovagal syncope during this hospitalization whenever he got up to sit at the bedside commode and was having bloody bowel movements.  Patient has been having vasovagal syncopal episodes since admission with ongoing bloody bowel movements and ongoing bleeding.  Repeat H&H with hemoglobin at 9.1 today.  No further bloody bowel movements noted overnight per RN.  Hopefully bleeding slowing down.  Patient has received 8 units of packed red blood cells during this hospitalization.  Repeat H&H. Continue TED hose, IV fluids.  CT head negative for any acute abnormalities.  Continue to monitor in the ICU.  Per RN patient noted to have some orthostatic blood pressures however remained asymptomatic.  Follow for  now.  GI following.   2.  Acute GI bleed/acute blood loss anemia Patient admitted with syncopal/presyncopal episode noted to be mildly hypotensive on admission with anemia.  Hemoglobin on admission was down to 7.6.  Patient transfused 2 units packed red blood cells.  Hemoglobin noted to be 6.8 the morning of 12/15/2018 and patient transfused another 2 units of packed red blood cells.  Patient with ongoing bloody bowel movements. Patient with large bloody bowel movement the morning of 12/16/2018, per RN with a vasovagal syncopal episode.   Patient seen by GI underwent upper endoscopy with no source of bleeding noted.  Patient with vasovagal syncopal episodes likely attributed to acute GI bleed.  Patient currently in the ICU unit due to ongoing GI bleed and vasovagal syncopal episodes.   Patient subsequently underwent a nuclear medicine GI bleeding scan on 12/15/2018 which showed a small area of increased activity left upper quadrant likely representing very slow hemorrhage within the splenic flexure.  Patient with repeat H&H of 9.1 this morning.  Hemoglobin seems to be stabilizing.  No overt bleeding per RN noted overnight.  Patient has received a total of 8 units packed red blood cells during this hospitalization.  Patient noted to have brown stools yesterday afternoon per RN.  Patient being followed by GI and patient underwent colonoscopy on 12/17/2018 which showed no evidence of colonic diverticula and old blood was noted throughout the colon concern for bleeding proximal to ileocecal valve per GI.  Patient with bloody bowel movements the night of 12/17/2018 in the morning of 12/18/2018. Transfusion goal hemoglobin less than 8.  Patient s/p capsule endoscopy (12/18/2018 ) per GI.  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  Per GI if bleeding recurs then may need referral to tertiary care  center for double-balloon enteroscopy.  GI has ordered repeat CT abdomen and pelvis which showed no acute findings in the abdomen..  Due to patient's ongoing bleeding, vasovagal syncope, now that is post transfusion of 8 units of packed red blood cells, general surgery was consulted for further evaluation and management.  Hemoglobin currently stable at 9.6.  General surgery following from [place]..  Per GI.   3.  Mitral regurgitation Stable.  Monitor closely for volume overload as patient with ongoing bleeding requiring transfusion of packed red blood cells.  4.  Mild hypotension Felt secondary to volume depletion in the setting of GI bleed.  Patient's antihypertensive medications of HCTZ and lisinopril held.  Hypotension improved.  Patient's antihypertensive medications have been resumed.  Follow.   5.  Hyperlipidemia Continue statin.  6.  Type 2 diabetes mellitus Oral hypoglycemic agents on hold.  CBG of 89 this morning.  Continue SSI.  7.  Gout Stable.  Continue allopurinol.  8.  Hypokalemia Patient with potassium of 3.6 this morning. Magnesium at 2.2.  9.  Hypertension Blood pressure improved.  Continue current regimen of Norvasc 5 mg daily, HCTZ 25 mg daily, lisinopril 40 mg daily.  Outpatient follow-up.    DVT prophylaxis: SCDs Code Status: Full Family Communication: updated patient. Updated daughter via facetime of patient's phone.   Disposition Plan: Awaiting telemetry bed.     Consultants:   Gastroenterology: Dr. Cristina Gong 12/13/2018  General surgery: Dr. Barry Dienes 12/16/2018  Procedures:   Chest x-ray 12/13/2018  Tagged red blood cell scan 12/15/2018  Upper endoscopy 12/14/2018  2 units transfusion packed red blood cells 12/13/2018  2 units transfusion packed red blood cells 12/15/2018--  2 units transfusion packed red blood cells 12/16/2018  2 units transfusion packed red blood cells 12/18/2018  Colonoscopy: Per Dr. Cristina Gong 12/17/2018  Capsule endoscopy 12/18/2018  DG  abdomen 12/20/2018  CT abdomen and pelvis 12/20/2018  Antimicrobials:   None   Subjective: Patient awake alert.  Denies any further bloody bowel movements.  No syncopal episodes per RN.  No chest pain.  No shortness of breath.  Feeling better.  GI at bedside.   Objective: Vitals:   12/22/18 0700 12/22/18 0750 12/22/18 0800 12/22/18 0900  BP: (!) 146/80  (!) 144/90 (!) 143/80  Pulse:      Resp: 19  20 (!) 27  Temp:  98.3 F (36.8 C)    TempSrc:  Oral    SpO2:   99% 99%  Weight:      Height:        Intake/Output Summary (Last 24 hours) at 12/22/2018 0930 Last data filed at 12/22/2018 0730 Gross per 24 hour  Intake --  Output 3200 ml  Net -3200 ml   Filed Weights   12/20/18 0443 12/21/18 0425 12/22/18 0500  Weight: 96.6 kg 96.3 kg 96 kg    Examination:  General exam: NAD Respiratory system: CTAB.  No wheezes, no crackles, no rhonchi.  Normal respiratory effort.  Cardiovascular system: Regular rate rhythm no murmurs rubs or gallops.  No JVD.  No lower extremity edema. Gastrointestinal system: Abdomen is soft, nontender, nondistended, positive bowel sounds.  No rebound.  No guarding.  Central nervous system: Alert.  Moving extremities spontaneously.  Extremities: Symmetric 5 x 5 power. Skin: No rashes, lesions or ulcers Psychiatry: Judgement and insight appear normal. Mood & affect  appropriate.     Data Reviewed: I have personally reviewed following labs and imaging studies  CBC: Recent Labs  Lab 12/17/18 0213  12/18/18 0224 12/18/18 1035 12/19/18 0245 12/20/18 0237 12/21/18 0231 12/22/18 0238  WBC 7.6  --  7.6  --   --  6.9 5.3 5.6  HGB 8.5*   < > 7.3* 7.1* 10.3* 9.9* 9.1* 9.6*  HCT 26.8*   < > 24.2* 22.0* 31.7* 30.6* 28.8* 30.1*  MCV 100.0  --  100.8*  --   --  98.1 98.0 99.0  PLT 105*  --  102*  --   --  108* 123* 110*   < > = values in this interval not displayed.   Basic Metabolic Panel: Recent Labs  Lab 12/18/18 0224 12/19/18 0245 12/20/18 0237  12/21/18 0231 12/22/18 0238  NA 144 145 141 139 141  K 3.3* 3.2* 3.5 3.7 3.6  CL 113* 108 106 107 107  CO2 25 28 25 26 26   GLUCOSE 162* 93 90 89 89  BUN 7* 5* 11 7* 8  CREATININE 0.95 0.98 1.14 1.03 1.07  CALCIUM 7.8* 8.1* 8.0* 8.1* 7.9*  MG  --  1.7 2.2  --   --    GFR: Estimated Creatinine Clearance: 82.5 mL/min (by C-G formula based on SCr of 1.07 mg/dL). Liver Function Tests: No results for input(s): AST, ALT, ALKPHOS, BILITOT, PROT, ALBUMIN in the last 168 hours. No results for input(s): LIPASE, AMYLASE in the last 168 hours. No results for input(s): AMMONIA in the last 168 hours. Coagulation Profile: No results for input(s): INR, PROTIME in the last 168 hours. Cardiac Enzymes: No results for input(s): CKTOTAL, CKMB, CKMBINDEX, TROPONINI in the last 168 hours. BNP (last 3 results) No results for input(s): PROBNP in the last 8760 hours. HbA1C: No results for input(s): HGBA1C in the last 72 hours. CBG: Recent Labs  Lab 12/20/18 2115 12/21/18 0740 12/21/18 1138 12/21/18 1610 12/21/18 2125  GLUCAP 83 93 113* 88 97   Lipid Profile: No results for input(s): CHOL, HDL, LDLCALC, TRIG, CHOLHDL, LDLDIRECT in the last 72 hours. Thyroid Function Tests: No results for input(s): TSH, T4TOTAL, FREET4, T3FREE, THYROIDAB in the last 72 hours. Anemia Panel: No results for input(s): VITAMINB12, FOLATE, FERRITIN, TIBC, IRON, RETICCTPCT in the last 72 hours. Sepsis Labs: No results for input(s): PROCALCITON, LATICACIDVEN in the last 168 hours.  Recent Results (from the past 240 hour(s))  Urine culture     Status: None   Collection Time: 12/13/18 11:43 AM   Specimen: Urine, Clean Catch  Result Value Ref Range Status   Specimen Description   Final    URINE, CLEAN CATCH Performed at [facility], [address] [place], [place] [address]    Special Requests   Final    Normal Performed at [facility], [address] [place], [place]  [address]    Culture   Final    NO GROWTH Performed at [facility], [address] [place], [place] [address]    Report Status 12/14/2018 FINAL  Final  SARS Coronavirus 2 (CEPHEID - Performed in [facility] hospital lab), Hosp Order     Status: None   Collection Time: 12/13/18 12:39 PM   Specimen: Nasopharyngeal Swab  Result Value Ref Range Status   SARS Coronavirus 2 NEGATIVE NEGATIVE Final    Comment: (NOTE) If result is NEGATIVE SARS-CoV-2 target nucleic acids are NOT DETECTED. The SARS-CoV-2 RNA is generally detectable in upper  and lower  respiratory specimens during the acute phase of infection. The lowest  concentration of SARS-CoV-2 viral copies this assay can detect is 250  copies / mL. A negative result does not preclude SARS-CoV-2 infection  and should not be used as the sole basis for treatment or other  patient management decisions.  A negative result may occur with  improper specimen collection / handling, submission of specimen other  than nasopharyngeal swab, presence of viral mutation(s) within the  areas targeted by this assay, and inadequate number of viral copies  (<250 copies / mL). A negative result must be combined with clinical  observations, patient history, and epidemiological information. If result is POSITIVE SARS-CoV-2 target nucleic acids are DETECTED. The SARS-CoV-2 RNA is generally detectable in upper and lower  respiratory specimens dur ing the acute phase of infection.  Positive  results are indicative of active infection with SARS-CoV-2.  Clinical  correlation with patient history and other diagnostic information is  necessary to determine patient infection status.  Positive results do  not rule out bacterial infection or co-infection with other viruses. If result is PRESUMPTIVE POSTIVE SARS-CoV-2 nucleic acids MAY BE PRESENT.   A presumptive positive result was obtained on the submitted specimen  and confirmed on repeat testing.  While 2019  novel coronavirus  (SARS-CoV-2) nucleic acids may be present in the submitted sample  additional confirmatory testing may be necessary for epidemiological  and / or clinical management purposes  to differentiate between  SARS-CoV-2 and other Sarbecovirus currently known to infect humans.  If clinically indicated additional testing with an alternate test  methodology [phone]([id number]) is advised. The SARS-CoV-2 RNA is generally  detectable in upper and lower respiratory sp ecimens during the acute  phase of infection. The expected result is Negative. Fact Sheet for Patients:  BoilerBrush.com.cyhttps://www.fda.gov/media/136312/download Fact Sheet for Healthcare Providers: https://pope.com/https://www.fda.gov/media/136313/download This test is not yet approved or cleared by the [place] FDA and has been authorized for detection and/or diagnosis of SARS-CoV-2 by FDA under an Emergency Use Authorization (EUA).  This EUA will remain in effect (meaning this test can be used) for the duration of the COVID-19 declaration under Section 564(b)(1) of the Act, 21 U.S.C. section 360bbb-3(b)(1), unless the authorization is terminated or revoked sooner. Performed at [facility], [address] [place], [place] [address]   MRSA PCR Screening     Status: None   Collection Time: 12/15/18 12:13 PM   Specimen: Nasal Mucosa; Nasopharyngeal  Result Value Ref Range Status   MRSA by PCR NEGATIVE NEGATIVE Final    Comment:        The GeneXpert MRSA Assay (FDA approved for NASAL specimens only), is one component of a comprehensive MRSA colonization surveillance program. It is not intended to diagnose MRSA infection nor to guide or monitor treatment for MRSA infections. Performed at [facility], [address] [place], [place] [address]          Radiology Studies: Ct Abdomen Pelvis W Contrast  Result Date: 12/20/2018 CLINICAL DATA:  Rectal bleeding for 8 days. Upper GI bleed without clear  source on endoscopy. EXAM: CT ABDOMEN AND PELVIS WITH CONTRAST TECHNIQUE: Multidetector CT imaging of the abdomen and pelvis was performed using the standard protocol following bolus administration of intravenous contrast. CONTRAST:  30mL OMNIPAQUE IOHEXOL 300 MG/ML SOLN, 100mL OMNIPAQUE IOHEXOL 300 MG/ML SOLN COMPARISON:  None. FINDINGS: Lower chest:   5 mm lingular nodule identified on 25/6. Hepatobiliary: No suspicious focal abnormality within the  liver parenchyma. 4 mm hypodensity in the central right liver is too small to characterize. There is no evidence for gallstones, gallbladder wall thickening, or pericholecystic fluid. No intrahepatic or extrahepatic biliary dilation. Pancreas: No focal mass lesion. No dilatation of the main duct. No intraparenchymal cyst. No peripancreatic edema. Spleen: No splenomegaly. No focal mass lesion. Adrenals/Urinary Tract: No adrenal nodule or mass. 1.4 cm low-density lesion in the right kidney is likely a cyst. 3.2 cm low-density lesion in the left kidney also likely a cyst. Other scattered low-density lesions in each kidney are too small to characterize. 3 mm nonobstructing stone noted upper pole left kidney. No evidence for hydroureter. The urinary bladder appears normal for the degree of distention. Stomach/Bowel: Stomach is unremarkable. No gastric wall thickening. No evidence of outlet obstruction. Duodenum is normally positioned as is the ligament of Treitz. No small bowel wall thickening. No small bowel dilatation. The terminal ileum is normal. The appendix is normal. No gross colonic mass. No colonic wall thickening. The distal portion of the rectum just prior to the anus has a slightly more redundant course than typically seen, nonspecific. Vascular/Lymphatic: There is abdominal aortic atherosclerosis without aneurysm. There is no gastrohepatic or hepatoduodenal ligament lymphadenopathy. No intraperitoneal or retroperitoneal lymphadenopathy. No pelvic sidewall  lymphadenopathy. Reproductive: The prostate gland and seminal vesicles are unremarkable. Other: Small volume free fluid noted in the pelvis. Musculoskeletal: No worrisome lytic or sclerotic osseous abnormality. Degenerative endplate changes seen around the L2-3 and L4-5 disc. Status post umbilical hernia repair. IMPRESSION: 1. No acute findings in the abdomen or pelvis. No specific findings to explain the patient's history of GI bleeding. 2. 5 mm left upper lobe pulmonary nodule. No follow-up needed if patient is low-risk. Non-contrast chest CT can be considered in 12 months if patient is high-risk. This recommendation follows the consensus statement: Guidelines for Management of Incidental Pulmonary Nodules Detected on CT Images: From the Fleischner Society 2017; Radiology 2017; 284:228-243. 3.  Aortic Atherosclerois (ICD10-170.0) Electronically Signed   By: Kennith Center M.D.   On: 12/20/2018 15:42        Scheduled Meds:  allopurinol  300 mg Oral Daily   amLODipine  5 mg Oral Daily   atorvastatin  80 mg Oral Daily   bisacodyl  10 mg Rectal Once   Chlorhexidine Gluconate Cloth  6 each Topical Daily   docusate sodium  100 mg Oral BID   hydrochlorothiazide  25 mg Oral Daily   insulin aspart  0-9 Units Subcutaneous TID WC   lisinopril  40 mg Oral Daily   mirtazapine  15 mg Oral QHS   polyethylene glycol  17 g Oral BID   [START ON 12/23/2018] potassium chloride SA  20 mEq Oral Daily   sodium chloride flush  3 mL Intravenous Q12H   Continuous Infusions:  sodium chloride       LOS: 9 days    Time spent: 40 minutes    Ramiro Harvest, MD [facility]  If 7PM-7AM, please contact night-coverage [email or web address] 12/22/2018, 9:30 AM

## 2018-12-22 NOTE — Progress Notes (Signed)
Physical Therapy Treatment Patient Details Name: James Gibbs MRN: [id number] DOB: [birth year] Today's Date: 12/22/2018    History of Present Illness 63 y.o. male admitted with GI bleed, syncopal episodes. History of hypertension, hyperlipidemia, mitral valve prolapse,DM II, MS    PT Comments    Progressing slowly with mobility. He remains unsteady but stability improved as ambulation distance increased. Discussed d/c options (rehab vs home)-pt stated he prefers to d/c home. He stated his daughter has been at his home since he has been in the hospital. I encouraged him to have her stay with him for a few days, at least until [facility] can get out to work with him a time or two.  He is agreeable to using a RW for ambulation safety. He prefers the rollator so I will recommend one for him. Recommend maximized home health services.     Follow Up Recommendations  Home health PT;Home health OT; Home Health Aide;Supervision/Assistance - 24 hour     Equipment Recommendations  (4 wheeled walker)    Recommendations for Other Services        Precautions / Restrictions Precautions Precautions: Fall Restrictions Weight Bearing Restrictions: No    Mobility  Bed Mobility Overal bed mobility: Modified Independent                Transfers Overall transfer level: Needs assistance Equipment used: None Transfers: Sit to/from Stand Sit to Stand: Min guard         General transfer comment: close guard for safety, lines.  Ambulation/Gait Ambulation/Gait assistance: Min assist Gait Distance (Feet): 150 Feet(x2) Assistive device: 4-wheeled walker Gait Pattern/deviations: Step-through pattern;Decreased stride length     General Gait Details: unsteady. cues for posture-somewhat crouched gait, narrow BOS. Noted R side lean as well. Steadiness improved as distance increased.   Stairs             Wheelchair Mobility    Modified Rankin (Stroke Patients Only)       Balance  Overall balance assessment: Needs assistance         Standing balance support: Bilateral upper extremity supported Standing balance-Leahy Scale: Fair                              Cognition Arousal/Alertness: Awake/alert Behavior During Therapy: WFL for tasks assessed/performed Overall Cognitive Status: Within Functional Limits for tasks assessed                                        Exercises      General Comments        Pertinent Vitals/Pain Pain Assessment: No/denies pain    Home Living                      Prior Function            PT Goals (current goals can now be found in the care plan section) Progress towards PT goals: Progressing toward goals    Frequency    Min 3X/week      PT Plan Current plan remains appropriate    Co-evaluation              AM-PAC PT "6 Clicks" Mobility   Outcome Measure  Help needed turning from your back to your side while in a flat bed without using bedrails?: A Little Help needed  moving from lying on your back to sitting on the side of a flat bed without using bedrails?: A Little Help needed moving to and from a bed to a chair (including a wheelchair)?: A Little Help needed standing up from a chair using your arms (e.g., wheelchair or bedside chair)?: A Little Help needed to walk in hospital room?: A Little Help needed climbing 3-5 steps with a railing? : A Little 6 Click Score: 18    End of Session Equipment Utilized During Treatment: Gait belt Activity Tolerance: Patient tolerated treatment well Patient left: in chair;with call bell/phone within reach   PT Visit Diagnosis: Unsteadiness on feet (R26.81);Muscle weakness (generalized) (M62.81)     Time: 2202-5427 PT Time Calculation (min) (ACUTE ONLY): 32 min  Charges:  $Gait Training: 23-37 mins                        Weston Anna, PT Acute Rehabilitation Services Pager: [phone] Office: [phone]

## 2018-12-23 LAB — GLUCOSE, CAPILLARY: Glucose-Capillary: 85 mg/dL (ref 70–99)

## 2021-06-03 IMAGING — CT CT ABDOMEN AND PELVIS WITH CONTRAST
2 of 5 series · 15 of 46 positions shown, 17 images · IV contrast (omnipaque)
Comparison: None.

CLINICAL DATA: Rectal bleeding for 8 days. Upper GI bleed without
clear source on endoscopy.

EXAM:
CT ABDOMEN AND PELVIS WITH CONTRAST
TECHNIQUE: Multidetector CT imaging of the abdomen and pelvis was performed
using the standard protocol following bolus administration of
intravenous contrast.
CONTRAST:  30mL OMNIPAQUE IOHEXOL 300 MG/ML SOLN, 100mL OMNIPAQUE
IOHEXOL 300 MG/ML SOLN

[Series 2: axial st · axial · 0.86mm/px · z∈[-415,+5]mm · 12 of 96 slices shown, 14 images]
[im 6/96  soft-tissue]
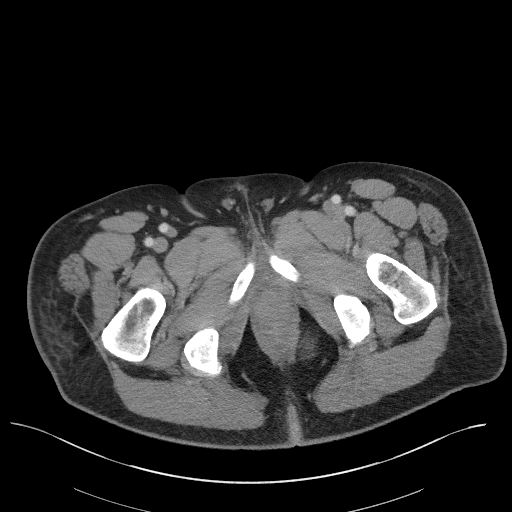
[im 6/96  bone]
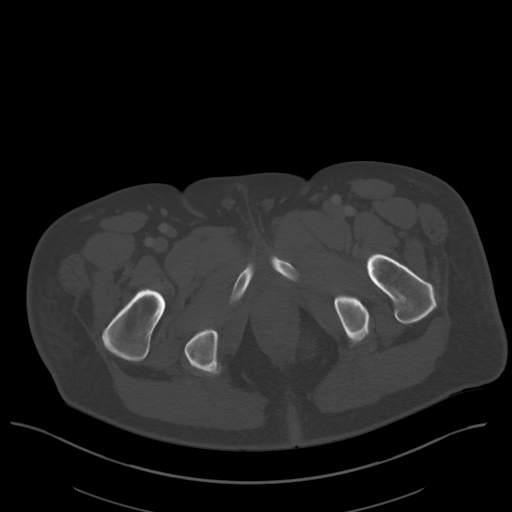
[im 12/96  soft-tissue]
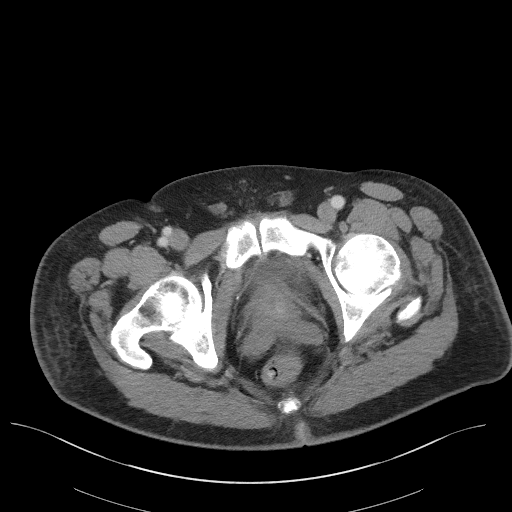
[im 24/96  soft-tissue]
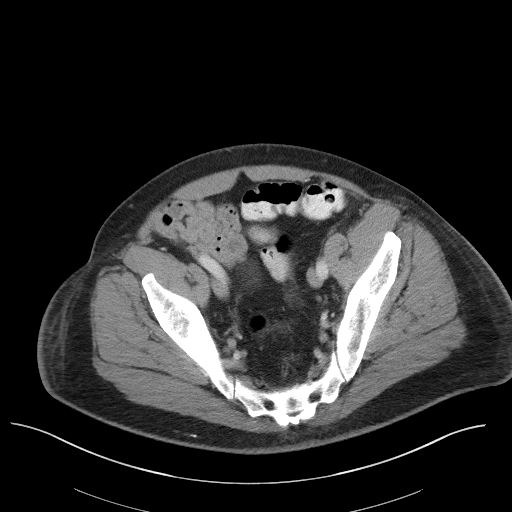
[im 30/96  soft-tissue]
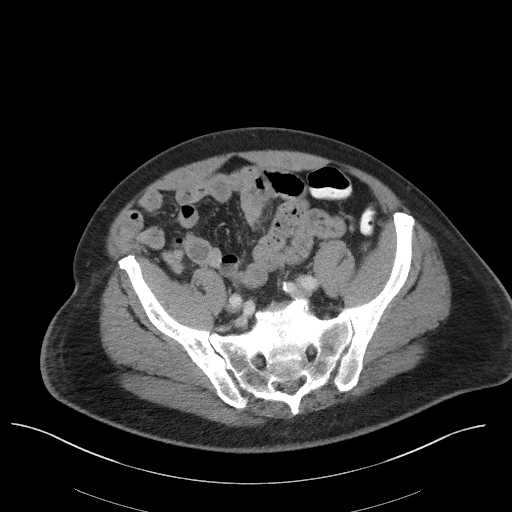
[im 36/96  soft-tissue]
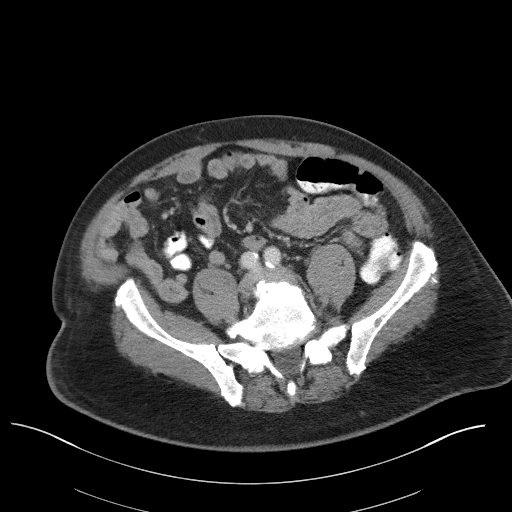
[im 42/96  soft-tissue]
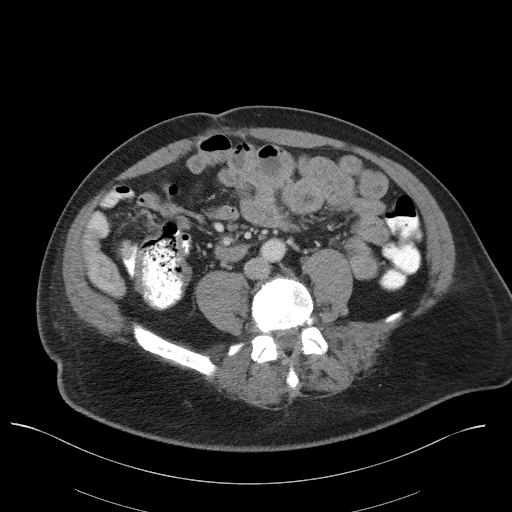
[im 54/96  soft-tissue]
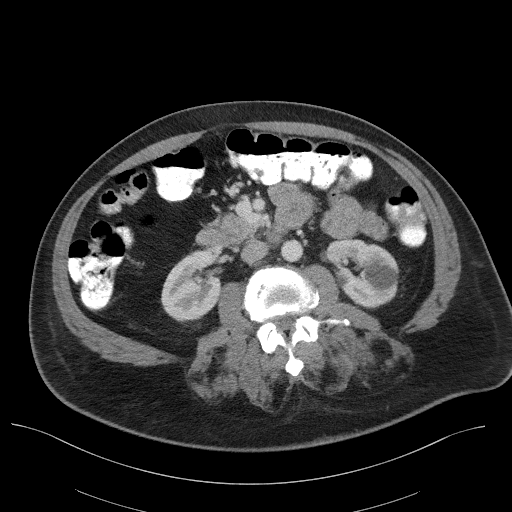
[im 60/96  soft-tissue]
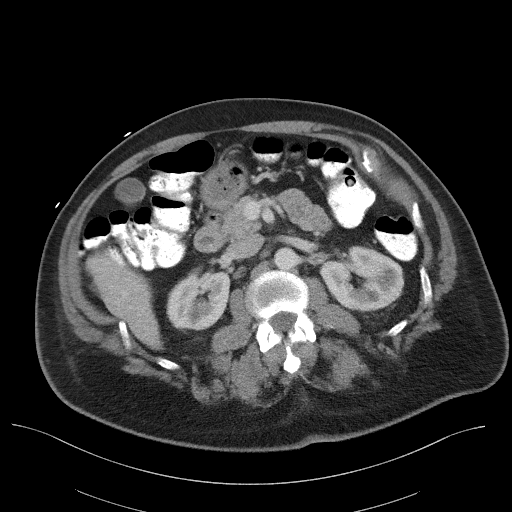
[im 66/96  soft-tissue]
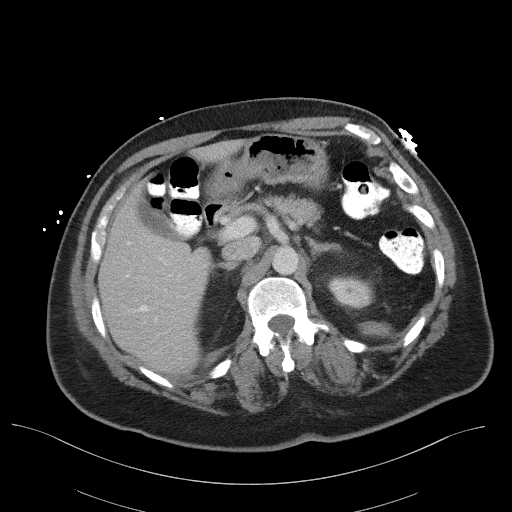
[im 66/96  bone]
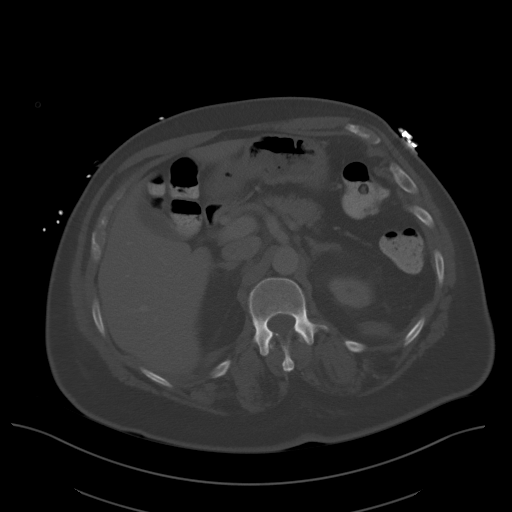
[im 72/96  soft-tissue]
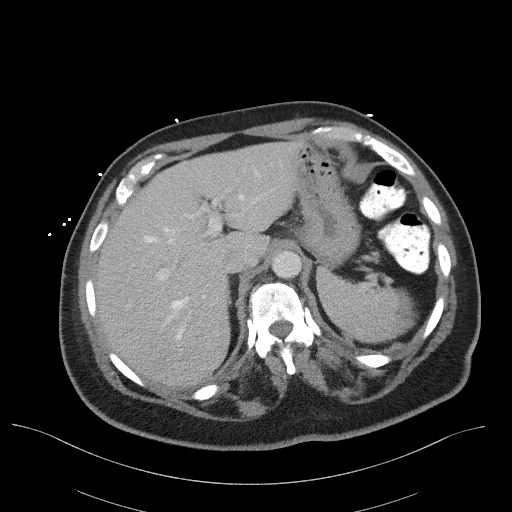
[im 84/96  soft-tissue]
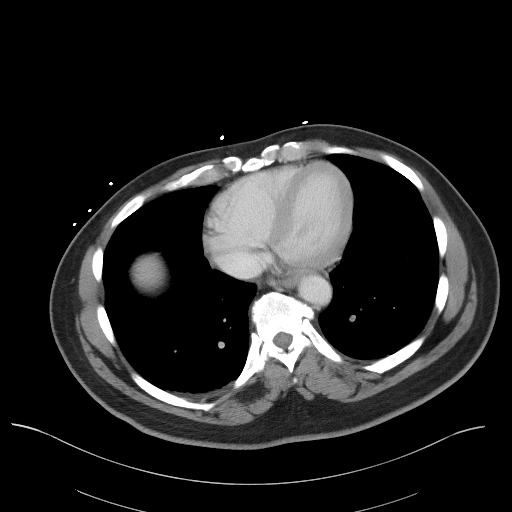
[im 90/96  soft-tissue]
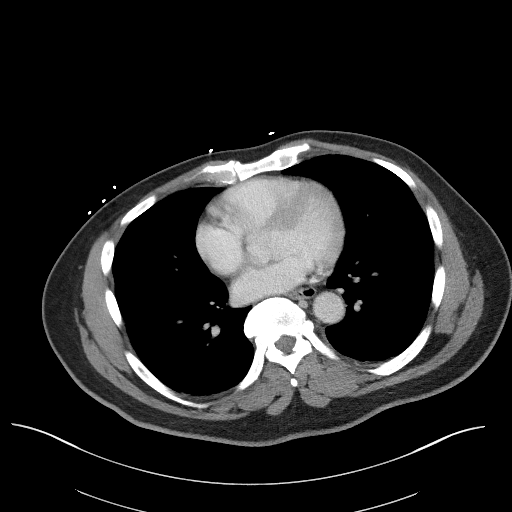

[Series 4: coronal st · coronal · 0.79mm/px · 3 of 113 slices shown]
[im 38/113  soft-tissue]
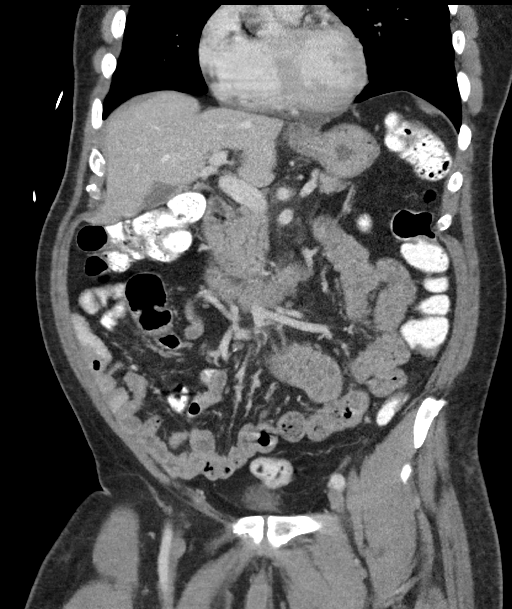
[im 50/113  soft-tissue]
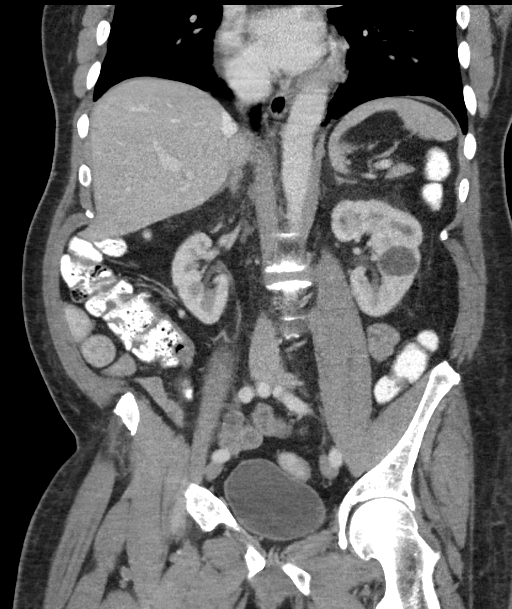
[im 63/113  soft-tissue]
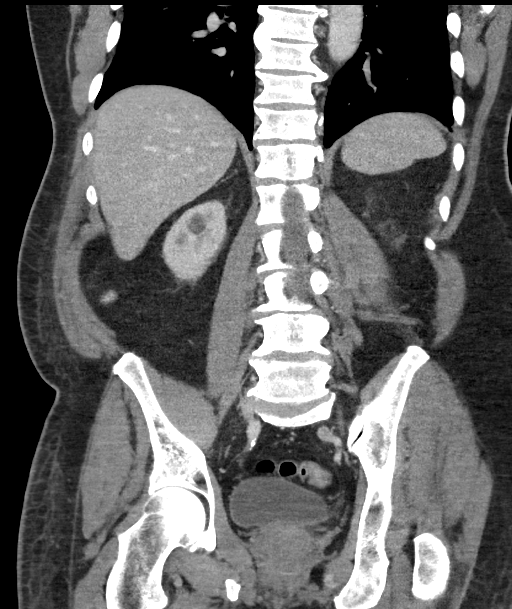

[15 of 46 positions shown; findings below may reference images not displayed]

FINDINGS: Lower chest:   5 mm lingular nodule identified on [DATE].

Hepatobiliary: No suspicious focal abnormality within the liver
parenchyma. 4 mm hypodensity in the central right liver is too small
to characterize. There is no evidence for gallstones, gallbladder
wall thickening, or pericholecystic fluid. No intrahepatic or
extrahepatic biliary dilation.

Pancreas: No focal mass lesion. No dilatation of the main duct. No
intraparenchymal cyst. No peripancreatic edema.

Spleen: No splenomegaly. No focal mass lesion.

Adrenals/Urinary Tract: No adrenal nodule or mass. 1.4 cm
low-density lesion in the right kidney is likely a cyst. 3.2 cm
low-density lesion in the left kidney also likely a cyst. Other
scattered low-density lesions in each kidney are too small to
characterize. 3 mm nonobstructing stone noted upper pole left
kidney. No evidence for hydroureter. The urinary bladder appears
normal for the degree of distention.

Stomach/Bowel: Stomach is unremarkable. No gastric wall thickening.
No evidence of outlet obstruction. Duodenum is normally positioned
as is the ligament of Treitz. No small bowel wall thickening. No
small bowel dilatation. The terminal ileum is normal. The appendix
is normal. No gross colonic mass. No colonic wall thickening. The
distal portion of the rectum just prior to the anus has a slightly
more redundant course than typically seen, nonspecific.

Vascular/Lymphatic: There is abdominal aortic atherosclerosis
without aneurysm. There is no gastrohepatic or hepatoduodenal
ligament lymphadenopathy. No intraperitoneal or retroperitoneal
lymphadenopathy. No pelvic sidewall lymphadenopathy.

Reproductive: The prostate gland and seminal vesicles are
unremarkable.

Other: Small volume free fluid noted in the pelvis.

Musculoskeletal: No worrisome lytic or sclerotic osseous
abnormality. Degenerative endplate changes seen around the L2-3 and
L4-5 disc. Status post umbilical hernia repair.
IMPRESSION: 1. No acute findings in the abdomen or pelvis. No specific findings
to explain the patient's history of GI bleeding.
2. 5 mm left upper lobe pulmonary nodule. No follow-up needed if
patient is low-risk. Non-contrast chest CT can be considered in 12
months if patient is high-risk. This recommendation follows the
consensus statement: Guidelines for Management of Incidental
Pulmonary Nodules Detected on CT Images: From the [HOSPITAL]
3.  Aortic Atherosclerois ([id number]-170.0)

## 2022-01-29 ENCOUNTER — Encounter (HOSPITAL_COMMUNITY): Payer: Self-pay

## 2022-01-29 ENCOUNTER — Other Ambulatory Visit: Payer: Self-pay

## 2022-01-29 ENCOUNTER — Emergency Department (HOSPITAL_COMMUNITY): Payer: Medicare PPO

## 2022-01-29 ENCOUNTER — Inpatient Hospital Stay (HOSPITAL_COMMUNITY)
Admission: EM | Admit: 2022-01-29 | Discharge: 2022-02-01 | DRG: 179 | Disposition: A | Discharge status: Home Health | Payer: Medicare PPO | Attending: Internal Medicine | Admitting: Internal Medicine

## 2022-01-29 DIAGNOSIS — R531 Weakness: Secondary | ICD-10-CM | POA: Diagnosis not present

## 2022-01-29 DIAGNOSIS — R7989 Other specified abnormal findings of blood chemistry: Secondary | ICD-10-CM | POA: Diagnosis not present

## 2022-01-29 DIAGNOSIS — I10 Essential (primary) hypertension: Secondary | ICD-10-CM | POA: Diagnosis present

## 2022-01-29 DIAGNOSIS — E876 Hypokalemia: Secondary | ICD-10-CM | POA: Diagnosis present

## 2022-01-29 DIAGNOSIS — E782 Mixed hyperlipidemia: Secondary | ICD-10-CM | POA: Diagnosis present

## 2022-01-29 DIAGNOSIS — N400 Enlarged prostate without lower urinary tract symptoms: Secondary | ICD-10-CM | POA: Diagnosis present

## 2022-01-29 DIAGNOSIS — I119 Hypertensive heart disease without heart failure: Secondary | ICD-10-CM | POA: Diagnosis present

## 2022-01-29 DIAGNOSIS — U071 COVID-19: Principal | ICD-10-CM | POA: Diagnosis present

## 2022-01-29 DIAGNOSIS — Z8249 Family history of ischemic heart disease and other diseases of the circulatory system: Secondary | ICD-10-CM

## 2022-01-29 DIAGNOSIS — Z79890 Hormone replacement therapy (postmenopausal): Secondary | ICD-10-CM

## 2022-01-29 DIAGNOSIS — R262 Difficulty in walking, not elsewhere classified: Secondary | ICD-10-CM | POA: Diagnosis present

## 2022-01-29 DIAGNOSIS — Z79899 Other long term (current) drug therapy: Secondary | ICD-10-CM

## 2022-01-29 DIAGNOSIS — I341 Nonrheumatic mitral (valve) prolapse: Secondary | ICD-10-CM | POA: Diagnosis present

## 2022-01-29 DIAGNOSIS — G35 Multiple sclerosis: Secondary | ICD-10-CM | POA: Diagnosis present

## 2022-01-29 DIAGNOSIS — E1159 Type 2 diabetes mellitus with other circulatory complications: Secondary | ICD-10-CM | POA: Diagnosis present

## 2022-01-29 LAB — URINALYSIS, ROUTINE W REFLEX MICROSCOPIC
Bilirubin Urine: NEGATIVE
Glucose, UA: NEGATIVE mg/dL
Hgb urine dipstick: NEGATIVE
Ketones, ur: NEGATIVE mg/dL
Leukocytes,Ua: NEGATIVE
Nitrite: NEGATIVE
Protein, ur: NEGATIVE mg/dL
Specific Gravity, Urine: 1.014 (ref 1.005–1.030)
pH: 6 (ref 5.0–8.0)

## 2022-01-29 LAB — CBC WITH DIFFERENTIAL/PLATELET
Abs Immature Granulocytes: 0.02 10*3/uL (ref 0.00–0.07)
Basophils Absolute: 0 10*3/uL (ref 0.0–0.1)
Basophils Relative: 0 %
Eosinophils Absolute: 0 10*3/uL (ref 0.0–0.5)
Eosinophils Relative: 0 %
HCT: 45.2 % (ref 39.0–52.0)
Hemoglobin: 15.2 g/dL (ref 13.0–17.0)
Immature Granulocytes: 0 %
Lymphocytes Relative: 5 %
Lymphs Abs: 0.5 10*3/uL — ABNORMAL LOW (ref 0.7–4.0)
MCH: 32.4 pg (ref 26.0–34.0)
MCHC: 33.6 g/dL (ref 30.0–36.0)
MCV: 96.4 fL (ref 80.0–100.0)
Monocytes Absolute: 0.8 10*3/uL (ref 0.1–1.0)
Monocytes Relative: 9 %
Neutro Abs: 7.5 10*3/uL (ref 1.7–7.7)
Neutrophils Relative %: 86 %
Platelets: 161 10*3/uL (ref 150–400)
RBC: 4.69 MIL/uL (ref 4.22–5.81)
RDW: 14.1 % (ref 11.5–15.5)
WBC: 8.8 10*3/uL (ref 4.0–10.5)
nRBC: 0 % (ref 0.0–0.2)

## 2022-01-29 LAB — COMPREHENSIVE METABOLIC PANEL
ALT: 47 U/L — ABNORMAL HIGH (ref 0–44)
AST: 39 U/L (ref 15–41)
Albumin: 4.8 g/dL (ref 3.5–5.0)
Alkaline Phosphatase: 95 U/L (ref 38–126)
Anion gap: 9 (ref 5–15)
BUN: 12 mg/dL (ref 8–23)
CO2: 29 mmol/L (ref 22–32)
Calcium: 9.8 mg/dL (ref 8.9–10.3)
Chloride: 104 mmol/L (ref 98–111)
Creatinine, Ser: 1.2 mg/dL (ref 0.61–1.24)
GFR, Estimated: >60 mL/min (ref >60)
Glucose, Bld: 107 mg/dL — ABNORMAL HIGH (ref 70–99)
Potassium: 3.5 mmol/L (ref 3.5–5.1)
Sodium: 142 mmol/L (ref 135–145)
Total Bilirubin: 1.8 mg/dL — ABNORMAL HIGH (ref 0.3–1.2)
Total Protein: 8.1 g/dL (ref 6.5–8.1)

## 2022-01-29 LAB — CK: Total CK: 372 U/L (ref 49–397)

## 2022-01-29 LAB — PROTIME-INR
INR: 1.1 (ref 0.8–1.2)
Prothrombin Time: 13.6 seconds (ref 11.4–15.2)

## 2022-01-29 LAB — APTT: aPTT: 28 seconds (ref 24–36)

## 2022-01-29 LAB — LACTIC ACID, PLASMA
Lactic Acid, Venous: 1.6 mmol/L (ref 0.5–1.9)
Lactic Acid, Venous: 1.1 mmol/L (ref 0.5–1.9)

## 2022-01-29 LAB — SARS CORONAVIRUS 2 BY RT PCR: SARS Coronavirus 2 by RT PCR: POSITIVE — AB

## 2022-01-29 MED ORDER — GADOBUTROL 1 MMOL/ML IV SOLN
10.0000 mL | Freq: Once | INTRAVENOUS | Status: AC | PRN
Start: 2022-01-29 — End: 2022-01-30
  Administered 2022-01-30: 10 mL via INTRAVENOUS

## 2022-01-29 MED ORDER — ACETAMINOPHEN 500 MG PO TABS
1000.0000 mg | ORAL_TABLET | Freq: Once | ORAL | Status: AC
  Administered 2022-01-29: 1000 mg via ORAL
  Filled 2022-01-29: qty 2

## 2022-01-29 NOTE — Plan of Care (Signed)
Discussed with Dr. Jeraldine Loots, patient with known multiple sclerosis on [employer] (gets his care through the [place] system) presenting fever without clear source at this time and generalized weakness but much worse with right leg weakness than is typical for him which has been progressive over a couple of days.   Concern for MS flare versus pseudo flare in the setting of infection and fever  Work-up of fever per ED/primary team MRI brain, cervical spine and thoracic spine with and without contrast to evaluate for flare versus pseudo flare If MRI imaging is positive or additional neurological questions/concerns arise please reach out to neurology for full consultation  Brooke Dare MD-PhD [facility] [phone] Available 7 AM to 7 PM, outside these hours please contact Neurologist on call listed on AMION

## 2022-01-29 NOTE — ED Triage Notes (Signed)
Pt arrived via EMS, from home, generalized wkn, slid out of bed this morning. Required EMS lift assist back to bed. Was on ground for several hours before able to be lifted back to bed.

## 2022-01-29 NOTE — ED Notes (Signed)
Dr. Criss Alvine advised to try and ambulate pt after he returns from MRI

## 2022-01-29 NOTE — ED Notes (Signed)
MRI called for update status as MRI has been order for a while, MRI tech stated pt is the next pt, should call for him within the hour, all MRI questions have been answer and address before arriving to scanner. Dr. Criss Alvine updated with MRI ETA.

## 2022-01-29 NOTE — ED Provider Notes (Signed)
[facility]-EMERGENCY DEPT Provider Note   CSN: [id number] Arrival date & time: 01/29/22  1359     History  Chief Complaint  Patient presents with   Weakness    James Gibbs is a 66 y.o. male.  HPI Patient presents with concern of weakness.  Patient has multiple medical issues most notably multiple sclerosis.  He has been doing generally well, but today, he had episode of weakness, slid from his bed to the floor and was unable to get himself back into bed.  No focal trauma nor focal complaints of pain.  Weakness is generalized not focal.  He is unaware of fever, denies dyspnea, denies nausea, vomiting.  He seemingly has been taking his medication as directed, though not today's dosing. He required EMS assistance to get up, or to get back into bed.    Home Medications Prior to Admission medications   Medication Sig Start Date End Date Taking? Authorizing Provider  allopurinol (ZYLOPRIM) 300 MG tablet Take 300 mg by mouth daily.    [provider]  amLODipine (NORVASC) 10 MG tablet Take 10 mg by mouth daily.    [provider]  Ascorbic Acid (VITAMIN C) 100 MG tablet Take 100 mg by mouth daily.    [provider]  atorvastatin (LIPITOR) 80 MG tablet Take 80 mg by mouth daily.    [provider]  cholecalciferol (VITAMIN D) 400 UNITS TABS tablet Take 400 Units by mouth daily.    [provider]  Dimethyl Fumarate (TECFIDERA) 240 MG CPDR Take 240 mg by mouth 2 (two) times daily.    [provider]  Hydrochlorothiazide (MICROZIDE PO) Take 25 mg by mouth daily.    [provider]  lisinopril (PRINIVIL,ZESTRIL) 40 MG tablet Take 40 mg by mouth daily.    [provider]  METFORMIN HCL PO Take 500 mg by mouth daily.     [provider]  Omega-3 Fatty Acids (FISH OIL) 1000 MG CAPS Take 3,000 mg by mouth.     [provider]  pantoprazole (PROTONIX) 40 MG tablet Take 1 tablet (40  mg total) by mouth daily. 12/22/18 12/22/19  Rodolph Bong, MD  polyethylene glycol (MIRALAX / GLYCOLAX) 17 g packet Take 17 g by mouth 2 (two) times daily. 12/22/18   Rodolph Bong, MD  potassium chloride SA (K-DUR) 20 MEQ tablet Take 20 mEq by mouth daily.    [provider]  senna-docusate (SENOKOT-S) 8.6-50 MG tablet Take 1 tablet by mouth 2 (two) times daily. 12/22/18   Rodolph Bong, MD  sildenafil (VIAGRA) 100 MG tablet Take 100 mg by mouth daily as needed for erectile dysfunction.    [provider]      Allergies    Patient has no known allergies.    Review of Systems   Review of Systems  Physical Exam Updated Vital Signs BP (!) 147/79 (BP Location: Right Arm)   Pulse (!) 105   Temp (!) 103.3 F (39.6 C) (Rectal)   Resp 16   Ht 5\' 11"  (1.803 m)   Wt 103 kg   SpO2 96%   BMI 31.66 kg/m  Physical Exam Vitals and nursing note reviewed.  Constitutional:      General: He is not in acute distress.    Appearance: He is well-developed.  HENT:     Head: Normocephalic and atraumatic.  Eyes:     Conjunctiva/sclera: Conjunctivae normal.  Cardiovascular:     Rate and Rhythm:  Normal rate and regular rhythm.  Pulmonary:     Effort: Pulmonary effort is normal. No respiratory distress.     Breath sounds: No stridor.  Abdominal:     General: There is no distension.  Skin:    General: Skin is warm and dry.  Neurological:     Mental Status: He is alert and oriented to person, place, and time.     Cranial Nerves: No cranial nerve deficit or dysarthria.     Motor: Atrophy present.     Comments: 4/5 bilat UE, L LE strength. 3/5 R LE strength     ED Results / Procedures / Treatments   Labs (all labs ordered are listed, but only abnormal results are displayed) Labs Reviewed  COMPREHENSIVE METABOLIC PANEL - Abnormal; Notable for the following components:      Result Value   Glucose, Bld 107 (*)    ALT 47 (*)    Total Bilirubin 1.8 (*)    All  other components within normal limits  CBC WITH DIFFERENTIAL/PLATELET - Abnormal; Notable for the following components:   Lymphs Abs 0.5 (*)    All other components within normal limits  CULTURE, BLOOD (ROUTINE X 2)  CULTURE, BLOOD (ROUTINE X 2)  URINE CULTURE  SARS CORONAVIRUS 2 BY RT PCR  LACTIC ACID, PLASMA  PROTIME-INR  APTT  CK  LACTIC ACID, PLASMA  URINALYSIS, ROUTINE W REFLEX MICROSCOPIC    EKG EKG Interpretation  Date/Time:  Wednesday January 29 2022 14:16:11 EDT Ventricular Rate:  98 PR Interval:  244 QRS Duration: 81 QT Interval:  328 QTC Calculation: 419 R Axis:   30 Text Interpretation: Sinus rhythm Prolonged PR interval Borderline T wave abnormalities Artifact Abnormal ECG Confirmed by Gerhard Munch [phone]) on 01/29/2022 3:46:26 PM  Radiology DG Chest Port 1 View  Result Date: 01/29/2022 CLINICAL DATA:  w questionable sepsis in a 66 year old male. EXAM: PORTABLE CHEST 1 VIEW COMPARISON:  December 13, 2018. FINDINGS: EKG leads project over the chest. Cardiomediastinal contours and hilar structures are normal. Lungs are clear. No sign of effusion or pneumothorax. On limited assessment no acute skeletal finding. IMPRESSION: No acute cardiopulmonary disease. Electronically Signed   By: Donzetta Kohut M.D.   On: 01/29/2022 15:22    Procedures Procedures    Medications Ordered in ED Medications  acetaminophen (TYLENOL) tablet 1,000 mg (1,000 mg Oral Given 01/29/22 1641)    ED Course/ Medical Decision Making/ A&P This patient with a Hx of multiple sclerosis presents to the ED for concern of weakness, this involves an extensive number of treatment options, and is a complaint that carries with it a high risk of complications and morbidity.    The differential diagnosis includes MS flare, infection, pseudo flare of MS   Social Determinants of Health:  No limits other than MS, chronic weakness  Additional history obtained:  Additional history and/or information  obtained from EMS, notable for transport notes, no notable abnormalities   After the initial evaluation, orders, including: Labs, x-ray, monitoring, MRI were initiated.   Patient placed on Cardiac and Pulse-Oximetry Monitors. The patient was maintained on a cardiac monitor.  The cardiac monitored showed an rhythm of 110 sinus tach abnormal The patient was also maintained on pulse oximetry. The readings were typically 99% room air normal   On repeat evaluation of the patient stayed the same  Lab Tests:  I personally interpreted labs.  The pertinent results include: Labs generally reassuring, no leukocytosis, urine pending, COVID pending on signout  Imaging  Studies ordered:  I independently visualized and interpreted imaging which showed x-ray without evidence for pneumonia, MRI pending I agree with the radiologist interpretation  Consultations Obtained:  I requested consultation with the neurology,  and discussed lab and imaging findings as well as pertinent plan - they recommend: MRI brain, C-spine, T-spine  Dispostion / Final MDM:  After consideration of the diagnostic results and the patient's response to treatment, this adult male presenting with weakness, global, but new, worsening in the right lower extremity is presenting with this is a concern.  Patient is awake, alert, speaking clearly, but has vital signs that are abnormal on arrival, with fever, tachycardia, tachypnea, but no difficulty breathing on the part of the patient.  Patient is not hypoxic, and no obvious initial source of infection is elucidated on labs, exam.  Incentive the patient is awaiting additional imaging, as well as urinalysis.  Dr. Criss Alvine is aware.  Final Clinical Impression(s) / ED Diagnoses Final diagnoses:  Weakness     Gerhard Munch, MD 01/29/22 1718

## 2022-01-29 NOTE — ED Notes (Signed)
Dr. Criss Alvine made aware that pt had difficulty with getting up out of bed with assistance and had difficulty with trying to ambulate with walker and one person assist. Pt assisted back to bed, no needs at current, pt still waiting on MRI at this time.

## 2022-01-29 NOTE — ED Notes (Signed)
Ambulated pt w/ walker. Pt put increase levels of effort attempting to sit up and stand, but was unable to. Pt wasn't sure as to why they were unable to sit up properly or stand.

## 2022-01-29 NOTE — ED Provider Notes (Signed)
8:33 PM  Patient's covid test is positive, likely cause of his fever/tachycardia and new weakness. Will continue with MRIs given MS as infection can cause MS flare. Otherwise, he feels a little better but still weak. Will ambulate and see how he does.   9:08 PM Patient was unable to get out of bed, even with nurse tech assistance. He will need admission. For now will hold on admission given it's unclear if it will need to be here vs [facility], which will depend on if he's having an MS exacerbation.  11:27 PM MRIs delayed due to positive covid test, techs did him last due to quarantine protocols.  He will need admission but unfortunately unclear if he needs, versus [facility].  Care transferred to Dr. Nicanor Alcon.   Pricilla Loveless, MD 01/29/22 [phone]

## 2022-01-29 NOTE — ED Notes (Signed)
Pt to MRI at this time, NAD noted, A&O x4.

## 2022-01-29 NOTE — ED Notes (Signed)
MRI tech called for update, they prefer to get another pt on the inpatient side first then get James Gibbs, they prefer to do him as the last pt of their night as they will need to shut down their mobile unit for 4 hrs since this pt is COVID positive. Dr. Criss Alvine made aware.

## 2022-01-30 ENCOUNTER — Encounter (HOSPITAL_COMMUNITY): Payer: Self-pay | Admitting: Family Medicine

## 2022-01-30 DIAGNOSIS — I10 Essential (primary) hypertension: Secondary | ICD-10-CM | POA: Diagnosis not present

## 2022-01-30 DIAGNOSIS — Z79890 Hormone replacement therapy (postmenopausal): Secondary | ICD-10-CM | POA: Diagnosis not present

## 2022-01-30 DIAGNOSIS — E782 Mixed hyperlipidemia: Secondary | ICD-10-CM | POA: Diagnosis present

## 2022-01-30 DIAGNOSIS — U071 COVID-19: Principal | ICD-10-CM

## 2022-01-30 DIAGNOSIS — Z8249 Family history of ischemic heart disease and other diseases of the circulatory system: Secondary | ICD-10-CM | POA: Diagnosis not present

## 2022-01-30 DIAGNOSIS — Z79899 Other long term (current) drug therapy: Secondary | ICD-10-CM | POA: Diagnosis not present

## 2022-01-30 DIAGNOSIS — G35 Multiple sclerosis: Secondary | ICD-10-CM | POA: Diagnosis present

## 2022-01-30 DIAGNOSIS — E876 Hypokalemia: Secondary | ICD-10-CM | POA: Diagnosis present

## 2022-01-30 DIAGNOSIS — R262 Difficulty in walking, not elsewhere classified: Secondary | ICD-10-CM | POA: Diagnosis present

## 2022-01-30 DIAGNOSIS — N400 Enlarged prostate without lower urinary tract symptoms: Secondary | ICD-10-CM | POA: Diagnosis present

## 2022-01-30 DIAGNOSIS — R531 Weakness: Secondary | ICD-10-CM | POA: Diagnosis present

## 2022-01-30 DIAGNOSIS — R7989 Other specified abnormal findings of blood chemistry: Secondary | ICD-10-CM | POA: Diagnosis not present

## 2022-01-30 DIAGNOSIS — I341 Nonrheumatic mitral (valve) prolapse: Secondary | ICD-10-CM | POA: Diagnosis present

## 2022-01-30 DIAGNOSIS — I119 Hypertensive heart disease without heart failure: Secondary | ICD-10-CM | POA: Diagnosis present

## 2022-01-30 LAB — URINE CULTURE

## 2022-01-30 LAB — HIV ANTIBODY (ROUTINE TESTING W REFLEX): HIV Screen 4th Generation wRfx: NONREACTIVE

## 2022-01-30 LAB — PROCALCITONIN: Procalcitonin: <0.1 ng/mL

## 2022-01-30 MED ORDER — CHOLECALCIFEROL 10 MCG (400 UNIT) PO TABS
400.0000 [IU] | ORAL_TABLET | Freq: Every day | ORAL | Status: DC
Start: 2022-01-30 — End: 2022-02-01
  Administered 2022-01-30 – 2022-02-01 (×3): 400 [IU] via ORAL
  Filled 2022-01-30 (×3): qty 1

## 2022-01-30 MED ORDER — SODIUM CHLORIDE 0.9 % IV SOLN
100.0000 mg | Freq: Every day | INTRAVENOUS | Status: DC
  Filled 2022-01-30: qty 20

## 2022-01-30 MED ORDER — ENOXAPARIN SODIUM 40 MG/0.4ML IJ SOSY
40.0000 mg | PREFILLED_SYRINGE | INTRAMUSCULAR | Status: DC
  Filled 2022-01-30: qty 0.4

## 2022-01-30 MED ORDER — ONDANSETRON HCL 4 MG PO TABS: 4.0000 mg | ORAL_TABLET | Freq: Four times a day (QID) | ORAL | Status: DC | PRN

## 2022-01-30 MED ORDER — DALFAMPRIDINE ER 10 MG PO TB12
10.0000 mg | ORAL_TABLET | Freq: Two times a day (BID) | ORAL | Status: DC
  Administered 2022-01-31 – 2022-02-01 (×3): 10 mg via ORAL

## 2022-01-30 MED ORDER — ATORVASTATIN CALCIUM 40 MG PO TABS
80.0000 mg | ORAL_TABLET | Freq: Every day | ORAL | Status: DC
  Administered 2022-01-30 – 2022-01-31 (×2): 80 mg via ORAL
  Filled 2022-01-30 (×2): qty 2

## 2022-01-30 MED ORDER — FINASTERIDE 5 MG PO TABS
5.0000 mg | ORAL_TABLET | Freq: Every day | ORAL | Status: DC
  Administered 2022-01-30 – 2022-02-01 (×3): 5 mg via ORAL
  Filled 2022-01-30 (×3): qty 1

## 2022-01-30 MED ORDER — ACETAMINOPHEN 325 MG PO TABS: 650.0000 mg | ORAL_TABLET | Freq: Four times a day (QID) | ORAL | Status: DC | PRN

## 2022-01-30 MED ORDER — DIMETHYL FUMARATE 240 MG PO CPDR: 240.0000 mg | DELAYED_RELEASE_CAPSULE | Freq: Two times a day (BID) | ORAL | Status: DC

## 2022-01-30 MED ORDER — SODIUM CHLORIDE 0.9 % IV SOLN
200.0000 mg | Freq: Once | INTRAVENOUS | Status: AC
  Administered 2022-01-30: 200 mg via INTRAVENOUS
  Filled 2022-01-30: qty 40

## 2022-01-30 MED ORDER — DIMETHYL FUMARATE 240 MG PO CPDR
240.0000 mg | DELAYED_RELEASE_CAPSULE | Freq: Two times a day (BID) | ORAL | Status: DC
  Administered 2022-01-31 – 2022-02-01 (×3): 240 mg via ORAL

## 2022-01-30 MED ORDER — OXYCODONE HCL 5 MG PO TABS: 5.0000 mg | ORAL_TABLET | ORAL | Status: DC | PRN

## 2022-01-30 MED ORDER — GUAIFENESIN-DM 100-10 MG/5ML PO SYRP
10.0000 mL | ORAL_SOLUTION | ORAL | Status: DC | PRN
  Administered 2022-01-30: 10 mL via ORAL
  Filled 2022-01-30 (×2): qty 10

## 2022-01-30 MED ORDER — DOCUSATE SODIUM 100 MG PO CAPS
100.0000 mg | ORAL_CAPSULE | Freq: Two times a day (BID) | ORAL | Status: DC | PRN
  Administered 2022-01-30: 100 mg via ORAL
  Filled 2022-01-30: qty 1

## 2022-01-30 MED ORDER — AMLODIPINE BESYLATE 5 MG PO TABS
10.0000 mg | ORAL_TABLET | Freq: Every day | ORAL | Status: DC
  Administered 2022-01-30 – 2022-02-01 (×3): 10 mg via ORAL
  Filled 2022-01-30 (×3): qty 2

## 2022-01-30 MED ORDER — VITAMIN C 500 MG PO TABS
500.0000 mg | ORAL_TABLET | Freq: Two times a day (BID) | ORAL | Status: DC
  Administered 2022-01-30 – 2022-02-01 (×4): 500 mg via ORAL
  Filled 2022-01-30 (×4): qty 1

## 2022-01-30 MED ORDER — LISINOPRIL 10 MG PO TABS
40.0000 mg | ORAL_TABLET | Freq: Every day | ORAL | Status: DC
  Administered 2022-01-30 – 2022-02-01 (×3): 40 mg via ORAL
  Filled 2022-01-30 (×3): qty 4

## 2022-01-30 MED ORDER — ENSURE ENLIVE PO LIQD
237.0000 mL | Freq: Two times a day (BID) | ORAL | Status: DC
  Administered 2022-01-30 – 2022-02-01 (×4): 237 mL via ORAL

## 2022-01-30 MED ORDER — ONDANSETRON HCL 4 MG/2ML IJ SOLN: 4.0000 mg | Freq: Four times a day (QID) | INTRAMUSCULAR | Status: DC | PRN

## 2022-01-30 MED ORDER — TAMSULOSIN HCL 0.4 MG PO CAPS
0.8000 mg | ORAL_CAPSULE | Freq: Every day | ORAL | Status: DC
  Administered 2022-01-30 – 2022-02-01 (×3): 0.8 mg via ORAL
  Filled 2022-01-30 (×3): qty 2

## 2022-01-30 MED ORDER — SENNOSIDES-DOCUSATE SODIUM 8.6-50 MG PO TABS
2.0000 | ORAL_TABLET | Freq: Two times a day (BID) | ORAL | Status: DC
  Administered 2022-01-31 – 2022-02-01 (×3): 2 via ORAL
  Filled 2022-01-30 (×4): qty 2

## 2022-01-30 MED ORDER — POLYETHYLENE GLYCOL 3350 17 G PO PACK: 17.0000 g | PACK | Freq: Every day | ORAL | Status: DC | PRN

## 2022-01-30 NOTE — ED Notes (Signed)
Pt has not return from MRI to bed ED 16 at this time

## 2022-01-30 NOTE — H&P (Addendum)
History and Physical    James Gibbs [id number] DOB: 1955-07-11 DOA: 01/29/2022  PCP: Zachery Dauer, MD   Patient coming from: Home   Chief Complaint: Weakness  HPI: James Gibbs is a 66 y.o. male with medical history significant for hypertension, mitral valve prolapse, and multiple sclerosis, now presenting to the emergency department with generalized weakness.  Patient recently developed a mild nonproductive cough and increased weakness.  He noticed that he was having increased difficulty ambulating with his walker 2 days ago, went on to become progressively weak in general and with increased right leg weakness.  Overnight, he got up to use the bathroom, was too weak to bear weight and slid to the floor.  He was unable to get up until EMS arrived to assist him.  ED Course: Upon arrival to the ED, patient is found to be febrile to 39.6 C and saturating mid to upper 90s on room air with mild tachypnea and SBP of 102 and greater.  COVID-19 PCR is positive.  Chest x-ray is negative for acute cardiopulmonary disease.  MRI brain is negative for acute intracranial abnormality and there does not appear to be any active demyelination on MRI of the cervical or thoracic spine.  Review of Systems:  All other systems reviewed and apart from HPI, are negative.  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    Malignant hypertension    Mitral valve anterior leaflet prolapse    Mixed hyperlipidemia     Past Surgical History:  Procedure Laterality Date   COLONOSCOPY WITH PROPOFOL N/A 12/17/2018   Procedure: COLONOSCOPY WITH PROPOFOL;  Surgeon: Ronald Lobo, MD;  Location: [facility];  Service: Endoscopy;  Laterality: N/A;   ESOPHAGOGASTRODUODENOSCOPY (EGD) WITH PROPOFOL N/A 12/14/2018   Procedure: ESOPHAGOGASTRODUODENOSCOPY (EGD) MODERATE SEDATION;  Surgeon: Ronald Lobo, MD;  Location: [facility];  Service: Endoscopy;   Laterality: N/A;   GIVENS CAPSULE STUDY N/A 12/18/2018   Procedure: GIVENS CAPSULE STUDY;  Surgeon: Arta Silence, MD;  Location: [facility];  Service: Endoscopy;  Laterality: N/A;   none      Social History:   reports that he has never smoked. He has never used smokeless tobacco. He reports that he does not drink alcohol and does not use drugs.  No Known Allergies  Family History  Problem Relation Age of Onset   Hypertension Mother    Hypertension Father      Prior to Admission medications   Medication Sig Start Date End Date Taking? Authorizing Provider  allopurinol (ZYLOPRIM) 300 MG tablet Take 300 mg by mouth daily.    [provider]  amLODipine (NORVASC) 10 MG tablet Take 10 mg by mouth daily.    [provider]  Ascorbic Acid (VITAMIN C) 100 MG tablet Take 100 mg by mouth daily.    [provider]  cholecalciferol (VITAMIN D) 400 UNITS TABS tablet Take 400 Units by mouth daily.    [provider]  Dimethyl Fumarate (TECFIDERA) 240 MG CPDR Take 240 mg by mouth 2 (two) times daily.    [provider]  Hydrochlorothiazide (MICROZIDE PO) Take 25 mg by mouth daily.    [provider]  lisinopril (PRINIVIL,ZESTRIL) 40 MG tablet Take 40 mg by mouth daily.    [provider]  METFORMIN HCL PO Take 500 mg by mouth daily.     [provider]  Omega-3 Fatty Acids (FISH OIL) 1000 MG CAPS Take 3,000 mg by  mouth.     [provider]  pantoprazole (PROTONIX) 40 MG tablet Take 1 tablet (40 mg total) by mouth daily. 12/22/18 01/29/22  Eugenie Filler, MD  polyethylene glycol (MIRALAX / GLYCOLAX) 17 g packet Take 17 g by mouth 2 (two) times daily. 12/22/18   Eugenie Filler, MD  potassium chloride SA (K-DUR) 20 MEQ tablet Take 20 mEq by mouth daily.    [provider]  senna-docusate (SENOKOT-S) 8.6-50 MG tablet Take 1 tablet by mouth 2 (two) times daily. 12/22/18   Eugenie Filler, MD  sildenafil  (VIAGRA) 100 MG tablet Take 100 mg by mouth daily as needed for erectile dysfunction.    [provider]    Physical Exam: Vitals:   01/30/22 0100 01/30/22 0130 01/30/22 0200 01/30/22 0209  BP: (!) 134/91 (!) 138/90 (!) 150/83   Pulse: 97 92 94   Resp: (!) 25 (!) 24 (!) 22   Temp:    99.3 F (37.4 C)  TempSrc:    Oral  SpO2: 95% 97% 98%   Weight:      Height:        Constitutional: NAD, no diaphoresis, no pallor  Eyes: PERTLA, lids and conjunctivae normal ENMT: Mucous membranes are moist. Posterior pharynx clear of any exudate or lesions.   Neck: supple, no masses  Respiratory: no wheezing, no crackles. No accessory muscle use.  Cardiovascular: S1 & S2 heard, regular rate and rhythm. No extremity edema.   Abdomen: No distension, no tenderness, soft. Bowel sounds active.  Musculoskeletal: no clubbing / cyanosis. No joint deformity upper and lower extremities.   Skin: no significant rashes, lesions, ulcers. Warm, dry, well-perfused. Neurologic: CN 2-12 grossly intact. Moving all extremities. Alert and oriented.  Psychiatric: Calm. Cooperative.    Labs and Imaging on Admission: I have personally reviewed following labs and imaging studies  CBC: Recent Labs  Lab 01/29/22 1425  WBC 8.8  NEUTROABS 7.5  HGB 15.2  HCT 45.2  MCV 96.4  PLT [id number]   Basic Metabolic Panel: Recent Labs  Lab 01/29/22 1425  NA 142  K 3.5  CL 104  CO2 29  GLUCOSE 107*  BUN 12  CREATININE 1.20  CALCIUM 9.8   GFR: Estimated Creatinine Clearance: 72 mL/min (by C-G formula based on SCr of 1.2 mg/dL). Liver Function Tests: Recent Labs  Lab 01/29/22 1425  AST 39  ALT 47*  ALKPHOS 95  BILITOT 1.8*  PROT 8.1  ALBUMIN 4.8   No results for input(s): "LIPASE", "AMYLASE" in the last 168 hours. No results for input(s): "AMMONIA" in the last 168 hours. Coagulation Profile: Recent Labs  Lab 01/29/22 1425  INR 1.1   Cardiac Enzymes: Recent Labs  Lab 01/29/22 1425  CKTOTAL 372    BNP (last 3 results) No results for input(s): "PROBNP" in the last 8760 hours. HbA1C: No results for input(s): "HGBA1C" in the last 72 hours. CBG: No results for input(s): "GLUCAP" in the last 168 hours. Lipid Profile: No results for input(s): "CHOL", "HDL", "LDLCALC", "TRIG", "CHOLHDL", "LDLDIRECT" in the last 72 hours. Thyroid Function Tests: No results for input(s): "TSH", "T4TOTAL", "FREET4", "T3FREE", "THYROIDAB" in the last 72 hours. Anemia Panel: No results for input(s): "VITAMINB12", "FOLATE", "FERRITIN", "TIBC", "IRON", "RETICCTPCT" in the last 72 hours. Urine analysis:    Component Value Date/Time   COLORURINE YELLOW 01/29/2022 1710   APPEARANCEUR CLEAR 01/29/2022 1710   LABSPEC 1.014 01/29/2022 1710   PHURINE 6.0 01/29/2022 1710   GLUCOSEU NEGATIVE 01/29/2022 1710  HGBUR NEGATIVE 01/29/2022 1710   BILIRUBINUR NEGATIVE 01/29/2022 1710   BILIRUBINUR neg 05/10/2013 2025   KETONESUR NEGATIVE 01/29/2022 1710   PROTEINUR NEGATIVE 01/29/2022 1710   UROBILINOGEN 0.2 05/10/2013 2025   NITRITE NEGATIVE 01/29/2022 1710   LEUKOCYTESUR NEGATIVE 01/29/2022 1710   Sepsis Labs: @LABRCNTIP (procalcitonin:4,lacticidven:4) ) Recent Results (from the past 240 hour(s))  SARS Coronavirus 2 by RT PCR (hospital order, performed in [place] hospital lab) *cepheid single result test* Anterior Nasal Swab     Status: Abnormal   Collection Time: 01/29/22  4:23 PM   Specimen: Anterior Nasal Swab  Result Value Ref Range Status   SARS Coronavirus 2 by RT PCR POSITIVE (A) NEGATIVE Final    Comment: (NOTE) SARS-CoV-2 target nucleic acids are DETECTED  SARS-CoV-2 RNA is generally detectable in upper respiratory specimens  during the acute phase of infection.  Positive results are indicative  of the presence of the identified virus, but do not rule out bacterial infection or co-infection with other pathogens not detected by the test.  Clinical correlation with patient history and  other  diagnostic information is necessary to determine patient infection status.  The expected result is negative.  Fact Sheet for Patients:   https://www.patel.info/   Fact Sheet for Healthcare Providers:   https://hall.com/    This test is not yet approved or cleared by the [place] FDA and  has been authorized for detection and/or diagnosis of SARS-CoV-2 by FDA under an Emergency Use Authorization (EUA).  This EUA will remain in effect (meaning this test can be used) for the duration of  the COVID-19 declaration under Section 564(b)(1)  of the Act, 21 U.S.C. section 360-bbb-3(b)(1), unless the authorization is terminated or revoked sooner.   Performed at [facility], [place] [address], [place], [place] [address]      Radiological Exams on Admission: MR THORACIC SPINE W WO CONTRAST  Result Date: 01/30/2022 CLINICAL DATA:  Ataxia EXAM: MRI CERVICAL AND THORACIC SPINE WITHOUT AND WITH CONTRAST TECHNIQUE: Multiplanar and multiecho pulse sequences of the cervical spine, to include the craniocervical junction and cervicothoracic junction, and the thoracic spine, were obtained without and with intravenous contrast. CONTRAST:  43mL GADAVIST GADOBUTROL 1 MMOL/ML IV SOLN COMPARISON:  No prior MRI of the cervical and thoracic spine available FINDINGS: MRI CERVICAL SPINE FINDINGS Alignment: Straightening and mild reversal of the normal cervical lordosis. No listhesis. Vertebrae: No acute fracture or suspicious osseous lesion. No abnormal enhancement. Endplate degenerative changes. 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 The spinal cord appears somewhat diminutive overall. Otherwise normal morphology and signal. No abnormal enhancement. Posterior Fossa, vertebral arteries, paraspinal tissues: Negative. Disc levels: C2-C3: Mild disc  bulge. No spinal canal stenosis or neural foraminal narrowing. C3-C4: Disc height loss with disc osteophyte complex. No spinal canal stenosis or neural foraminal narrowing. C4-C5: Disc height loss and mild disc bulge. Facet and uncovertebral hypertrophy. No spinal canal stenosis. Mild bilateral neural foraminal narrowing. C5-C6: Disc height loss with central/left paracentral disc extrusion with 5 mm of caudal migration, which indents the ventral spinal cord and causes cord flattening. Moderate spinal canal stenosis. Mild right and moderate left neural foraminal narrowing. C6-C7: Disc height loss without significant disc bulge. Uncovertebral hypertrophy. No spinal canal stenosis. Mild left neural foraminal narrowing. C7-T1: No significant disc bulge. Facet arthropathy. No spinal canal stenosis. Mild right-greater-than-left neural foraminal narrowing. MRI THORACIC SPINE FINDINGS Alignment: S  shaped curvature of the thoracic spine. Preservation of the normal thoracic kyphosis. No significant listhesis. Vertebrae: No acute fracture or suspicious osseous lesion. Endplate degenerative changes and Schmorl's node formation at the inferior endplate of T5. Contrast enhancement and increased T2 signal likely reflect edema associated with the Schmorl's node. Cord:  Normal morphology and signal.  No abnormal enhancement. Paraspinal and other soft tissues: Evaluation is somewhat limited by motion artifact. Small bilateral pleural effusions. Bilateral renal cysts, for which no follow-up is currently indicated. Significant fatty atrophy of the paraspinous musculature beginning at the level of T10 T10 (series 20, image 45 and series 19, image 17). Disc levels: No significant spinal canal stenosis. Facet arthropathy causes moderate left neural foraminal narrowing at T10-T11 and mild right neural foraminal narrowing at T11-T12. IMPRESSION: CERVICAL SPINE: 1. 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 The cervical spinal cord appear somewhat diminutive overall but is otherwise normal in morphology and signal, without abnormal enhancement. 2. C5-C6 moderate spinal canal stenosis with moderate left and mild right neural foraminal narrowing. 3. Mild neural foraminal narrowing bilaterally at C4-C5 and C7-T1, as well as on left at C6-C7. THORACIC SPINE: 1. Significant fatty atrophy of the paraspinous musculature extending inferiorly from the level of T10. 2. No significant spinal canal stenosis. No abnormal cord signal or enhancement. 3. T10-T11 moderate left neural foraminal narrowing at T11-T12 mild right neural foraminal narrowing. 4. Small bilateral pleural effusions. Electronically Signed   By: Wiliam Ke M.D.   On: 01/30/2022 00:53   MR Cervical Spine W or Wo Contrast  Result Date: 01/30/2022 CLINICAL DATA:  Ataxia EXAM: MRI CERVICAL AND THORACIC SPINE WITHOUT AND WITH CONTRAST TECHNIQUE: Multiplanar and multiecho pulse sequences of the cervical spine, to include the craniocervical junction and cervicothoracic junction, and the thoracic spine, were obtained without and with intravenous contrast. CONTRAST:  17mL GADAVIST GADOBUTROL 1 MMOL/ML IV SOLN COMPARISON:  No prior MRI of the cervical and thoracic spine available FINDINGS: MRI CERVICAL SPINE FINDINGS Alignment: Straightening and mild reversal of the normal cervical lordosis. No listhesis. Vertebrae: No acute fracture or suspicious osseous lesion. No abnormal enhancement. Endplate degenerative changes. 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 The spinal cord appears somewhat diminutive overall. Otherwise normal morphology and signal. No abnormal enhancement. Posterior Fossa, vertebral arteries, paraspinal tissues: Negative. Disc levels: C2-C3: Mild disc bulge. No spinal canal stenosis or  neural foraminal narrowing. C3-C4: Disc height loss with disc osteophyte complex. No spinal canal stenosis or neural foraminal narrowing. C4-C5: Disc height loss and mild disc bulge. Facet and uncovertebral hypertrophy. No spinal canal stenosis. Mild bilateral neural foraminal narrowing. C5-C6: Disc height loss with central/left paracentral disc extrusion with 5 mm of caudal migration, which indents the ventral spinal cord and causes cord flattening. Moderate spinal canal stenosis. Mild right and moderate left neural foraminal narrowing. C6-C7: Disc height loss without significant disc bulge. Uncovertebral hypertrophy. No spinal canal stenosis. Mild left neural foraminal narrowing. C7-T1: No significant disc bulge. Facet arthropathy. No spinal canal stenosis. Mild right-greater-than-left neural foraminal narrowing. MRI THORACIC SPINE FINDINGS Alignment: S shaped curvature of the thoracic spine. Preservation of the normal thoracic kyphosis. No significant listhesis. Vertebrae: No acute fracture or suspicious osseous lesion. Endplate degenerative changes and Schmorl's node formation at the inferior endplate of T5. Contrast enhancement and increased T2 signal likely  reflect edema associated with the Schmorl's node. Cord:  Normal morphology and signal.  No abnormal enhancement. Paraspinal and other soft tissues: Evaluation is somewhat limited by motion artifact. Small bilateral pleural effusions. Bilateral renal cysts, for which no follow-up is currently indicated. Significant fatty atrophy of the paraspinous musculature beginning at the level of T10 T10 (series 20, image 45 and series 19, image 17). Disc levels: No significant spinal canal stenosis. Facet arthropathy causes moderate left neural foraminal narrowing at T10-T11 and mild right neural foraminal narrowing at T11-T12. IMPRESSION: CERVICAL SPINE: 1. Increased T2 signal in the spinal cord with flattening of the spinal cord at the level of C5-C6, likely  compressive myelopathy. The cervical spinal cord appear somewhat diminutive overall but is otherwise normal in morphology and signal, without abnormal enhancement. 2. C5-C6 moderate spinal canal stenosis with moderate left and mild right neural foraminal narrowing. 3. Mild neural foraminal narrowing bilaterally at C4-C5 and C7-T1, as well as on left at C6-C7. THORACIC SPINE: 1. Significant fatty atrophy of the paraspinous musculature extending inferiorly from the level of T10. 2. No significant spinal canal stenosis. No abnormal cord signal or enhancement. 3. T10-T11 moderate left neural foraminal narrowing at T11-T12 mild right neural foraminal narrowing. 4. Small bilateral pleural effusions. Electronically Signed   By: Wiliam Ke M.D.   On: 01/30/2022 00:53   MR Brain W and Wo Contrast  Result Date: 01/30/2022 CLINICAL DATA:  Mental status change EXAM: MRI HEAD WITHOUT AND WITH CONTRAST TECHNIQUE: Multiplanar, multiecho pulse sequences of the brain and surrounding structures were obtained without and with intravenous contrast. CONTRAST:  75mL GADAVIST GADOBUTROL 1 MMOL/ML IV SOLN COMPARISON:  No prior MRI, correlation is made with 12/16/2018 CT head FINDINGS: Brain: No restricted diffusion to suggest acute or subacute infarct. No acute hemorrhage, mass, mass effect, or midline shift. No hemosiderin deposition to suggest remote hemorrhage. No extra-axial collection. Redemonstrated ventricular and sulcal enlargement, likely reflecting advanced cerebral atrophy for age. T2 hyperintense signal in the periventricular white matter, likely the sequela of moderate chronic small vessel ischemic disease. Left lentiform nucleus remote lacunar infarct. No abnormal parenchymal or meningeal enhancement. Vascular: Normal arterial flow voids. Normal arterial and venous enhancement. Skull and upper cervical spine: Normal marrow signal. Sinuses/Orbits: Mucous retention cysts in the maxillary sinuses and left ethmoid air  cells. The orbits are unremarkable. Other: The mastoids are well aerated. IMPRESSION: 1. No acute intracranial process. No evidence of acute or subacute infarct. 2. Advanced cerebral atrophy for age, with the sequela of moderate chronic small vessel ischemic disease. Electronically Signed   By: Wiliam Ke M.D.   On: 01/30/2022 00:35   DG Chest Port 1 View  Result Date: 01/29/2022 CLINICAL DATA:  w questionable sepsis in a 66 year old male. EXAM: PORTABLE CHEST 1 VIEW COMPARISON:  December 13, 2018. FINDINGS: EKG leads project over the chest. Cardiomediastinal contours and hilar structures are normal. Lungs are clear. No sign of effusion or pneumothorax. On limited assessment no acute skeletal finding. IMPRESSION: No acute cardiopulmonary disease. Electronically Signed   By: Donzetta Kohut M.D.   On: 01/29/2022 15:22    EKG: Independently reviewed. SR, 1st degree AV block.   Assessment/Plan   1. COVID-19 infection  - Presents with cough and increased weakness, leaving him unable to get up from the floor after sliding to the ground  - He is found to have COVID-19 infection, causing pseudorelapse of MS   - He is stable on rm air but has clinical risk factors for  progression to severe disease  - Start remdesivir, continue supportive care    2. Multiple sclerosis  - Presented with increased weakness, unable to get up from floor  - Does not appear to have active demyelination on MRI and presentation likely pseudorelapse from acute COVID infection  - Consult PT, Hold Tecfidera until lymphocytes are 700 or greater    3. Hypertension  - Continue Norvasc and lisinopril     DVT prophylaxis: Lovenox  Code Status: Full  Level of Care: Level of care: Med-Surg Family Communication: none present  Disposition Plan:  Patient is from: home  Anticipated d/c is to: TBD Anticipated d/c date is: 9/8 or 02/01/22  Patient currently: pending PT eval, stable or improved COVID symptoms Consults called:  none Admission status: Observation     Briscoe Deutscher, MD [facility]  01/30/2022, 3:08 AM

## 2022-01-30 NOTE — ED Notes (Signed)
Pt return from MRI, pt back in RM 16, NAD noted, pt A&O x4.

## 2022-01-30 NOTE — ED Notes (Signed)
Secure message sent to receiving RN Rosalio Macadamia, awaiting response. Pt has an inpatient bed at this time

## 2022-01-30 NOTE — ED Notes (Signed)
Dr. Antionette Char with inpatient admission at the bedside to assess pt

## 2022-01-30 NOTE — ED Notes (Signed)
Pt still has not return from MRI, pt not seen in treatment room 16. MRI has not called regarding pt's return at this time.

## 2022-01-30 NOTE — ED Notes (Signed)
Pt resting and watching TV, NAD noted, observed even RR and unlabored, side rails up x2 for safety, plan of care ongoing, pt expresses no needs or concerns at this time, call light within reach, no further concerns as of present.  

## 2022-01-30 NOTE — Progress Notes (Signed)
  PROGRESS NOTE  Patient admitted earlier this morning. See H&P.   James Gibbs is a 66 y.o. male with medical history significant for hypertension, mitral valve prolapse, and multiple sclerosis, now presenting to the emergency department with generalized weakness.  Patient recently developed a mild nonproductive cough and increased weakness.  He noticed that he was having increased difficulty ambulating with his walker 2 days ago, went on to become progressively weak in general and with increased right leg weakness.  Overnight, he got up to use the bathroom, was too weak to bear weight and slid to the floor.  He was unable to get up until EMS arrived to assist him.  COVID-19 PCR is positive.  Chest x-ray is negative for acute cardiopulmonary disease.  MRI brain is negative for acute intracranial abnormality and there does not appear to be any active demyelination on MRI of the cervical or thoracic spine.   He was started on Remdesivir due to his risk factor to progressed to severe disease.  On examination, he states that his cough and breathing has improved.  Continues to be weak.  He lives at home alone and uses a rolling walker.  He is stable on room air.  Await PT evaluation and continue Remdesivir.    Status is: Observation The patient will require care spanning > 2 midnights and should be moved to inpatient because: Need PT OT   Noralee Stain, DO [facility] 01/30/2022, 10:34 AM  Available via Epic secure chat 7am-7pm After these hours, please refer to coverage provider listed on [email or web address]

## 2022-01-30 NOTE — ED Notes (Signed)
Pt has an inpatient bed assigned 1411, waiting on RN to be assigned so handoff report may be given.

## 2022-01-30 NOTE — Evaluation (Signed)
Physical Therapy Evaluation Patient Details Name: James Gibbs MRN: [id number] DOB: 01/28/1956 Today's Date: 01/30/2022  History of Present Illness  James Gibbs is a 66 y.o. male presenting to the emergency department with generalized weakness.  Patient recently developed a mild nonproductive cough and increased weakness.  He noticed that he was having increased difficulty ambulating with his walker 2 days ago, went on to become progressively weak in general and with increased right leg weakness.  Overnight, he got up to use the bathroom, was too weak to bear weight and slid to the floor. COVID-19 PCR is positive.  Chest x-ray is negative for acute cardiopulmonary disease.  MRI brain is negative for acute intracranial abnormality and there does not appear to be any active demyelination on MRI of the cervical or thoracic spine.PMH significant for with medical history significant for hypertension, mitral valve prolapse, and multiple sclerosis.    Clinical Impression  James Gibbs is 66 y.o. Male admitted with above HPI and diagnosis. Patient is currently limited by functional impairments below (see PT problem list). Patient lives alone and is modified independent with rollator for mobility at baseline. He currently required Mod assist for bed mobility and transfers and is limited by Rt LE weakness and slight buckling in standing. Patient will benefit from continued skilled PT interventions to address impairments and progress independence with mobility, recommending HHPT pending progress with mobility. Acute PT will follow and progress as able.        Recommendations for follow up therapy are one component of a multi-disciplinary discharge planning process, led by the attending physician.  Recommendations may be updated based on patient status, additional functional criteria and insurance authorization.  Follow Up Recommendations Home health PT (Pt works [facility] for [facility]PPT but will need HH to  start)      Assistance Recommended at Discharge Intermittent Supervision/Assistance  Patient can return home with the following  A little help with walking and/or transfers;A little help with bathing/dressing/bathroom;Assistance with cooking/housework;Direct supervision/assist for medications management;Help with stairs or ramp for entrance;Assist for transportation    Equipment Recommendations None recommended by PT  Recommendations for Other Services       Functional Status Assessment Patient has had a recent decline in their functional status and demonstrates the ability to make significant improvements in function in a reasonable and predictable amount of time.     Precautions / Restrictions Precautions Precautions: Fall Restrictions Weight Bearing Restrictions: No      Mobility  Bed Mobility Overal bed mobility: Needs Assistance Bed Mobility: Supine to Sit     Supine to sit: Mod assist, HOB elevated     General bed mobility comments: pt required assist to fully bring LE's off EOB and fully raise trunk. Assist with bed pad to scoot to edge.    Transfers Overall transfer level: Needs assistance Equipment used: Rolling walker (2 wheels) Transfers: Sit to/from Stand, Bed to chair/wheelchair/BSC Sit to Stand: Mod assist, From elevated surface   Step pivot transfers: Mod assist, Min assist       General transfer comment: Mod assist for power up from EOB with cues for hand placement. Mod assist to block Rt LE at start to facilitate knee extension while stepping, pt progressed to min assist to guide turn with RW.    Ambulation/Gait                  Careers information officer  Modified Rankin (Stroke Patients Only)       Balance Overall balance assessment: Needs assistance Sitting-balance support: Feet supported Sitting balance-Leahy Scale: Fair     Standing balance support: During functional activity Standing balance-Leahy  Scale: Poor                               Pertinent Vitals/Pain Pain Assessment Pain Assessment: No/denies pain    Home Living Family/patient expects to be discharged to:: Private residence Living Arrangements: Alone Available Help at Discharge: Family Type of Home: House Home Access: Stairs to enter Entrance Stairs-Rails: Can reach both Entrance Stairs-Number of Steps: 3   Home Layout: One level Home Equipment: Rollator (4 wheels);BSC/3in1;Shower seat;Hand held shower head;Grab bars - tub/shower Additional Comments: pt livea alone, has been doing OPPT at [facility] in [place], family/daughter helps him as needed.    Prior Function Prior Level of Function : Independent/Modified Independent             Mobility Comments: mobilizes with rollator ADLs Comments: uses meals on wheels for L/D every day M-F he has cereal in morning. he figures out meals on weekends,     Hand Dominance        Extremity/Trunk Assessment   Upper Extremity Assessment Upper Extremity Assessment: Defer to OT evaluation;Overall WFL for tasks assessed    Lower Extremity Assessment Lower Extremity Assessment: RLE deficits/detail;LLE deficits/detail RLE Deficits / Details: grossly 3+/5 for hip flexion, knee flex/ext, and dorsiflexion RLE Coordination: decreased gross motor LLE Deficits / Details: grossly 4/5 for hip flexion, knee flex/ext, and dorsiflexion    Cervical / Trunk Assessment Cervical / Trunk Assessment: Normal  Communication   Communication: No difficulties  Cognition Arousal/Alertness: Awake/alert Behavior During Therapy: WFL for tasks assessed/performed Overall Cognitive Status: Within Functional Limits for tasks assessed                                          General Comments      Exercises     Assessment/Plan    PT Assessment Patient needs continued PT services  PT Problem List Decreased strength;Decreased range of motion;Decreased  activity tolerance;Decreased balance;Decreased mobility;Decreased knowledge of use of DME;Decreased knowledge of precautions;Decreased safety awareness;Obesity       PT Treatment Interventions DME instruction;Gait training;Stair training;Functional mobility training;Therapeutic activities;Therapeutic exercise;Balance training;Neuromuscular re-education;Patient/family education    PT Goals (Current goals can be found in the Care Plan section)  Acute Rehab PT Goals Patient Stated Goal: get better and home PT Goal Formulation: With patient Time For Goal Achievement: 02/13/22 Potential to Achieve Goals: Good    Frequency Min 3X/week     Co-evaluation               AM-PAC PT "6 Clicks" Mobility  Outcome Measure Help needed turning from your back to your side while in a flat bed without using bedrails?: A Little Help needed moving from lying on your back to sitting on the side of a flat bed without using bedrails?: A Lot Help needed moving to and from a bed to a chair (including a wheelchair)?: A Lot Help needed standing up from a chair using your arms (e.g., wheelchair or bedside chair)?: A Lot Help needed to walk in hospital room?: A Lot Help needed climbing 3-5 steps with a railing? : Total 6 Click Score: 12  End of Session Equipment Utilized During Treatment: Gait belt Activity Tolerance: Patient tolerated treatment well Patient left: in chair;with call bell/phone within reach Nurse Communication: Mobility status PT Visit Diagnosis: Muscle weakness (generalized) (M62.81);Difficulty in walking, not elsewhere classified (R26.2);Other abnormalities of gait and mobility (R26.89);Unsteadiness on feet (R26.81);History of falling (Z91.81)    Time: [id number] PT Time Calculation (min) (ACUTE ONLY): 40 min   Charges:   PT Evaluation $PT Eval Moderate Complexity: 1 Mod PT Treatments $Therapeutic Activity: 23-37 mins        Verner Mould, DPT Acute Rehabilitation  Services Office [phone] Pager [phone]  01/30/22 3:55 PM

## 2022-01-31 DIAGNOSIS — U071 COVID-19: Secondary | ICD-10-CM | POA: Diagnosis not present

## 2022-01-31 LAB — D-DIMER, QUANTITATIVE: D-Dimer, Quant: 1.55 ug/mL-FEU — ABNORMAL HIGH (ref 0.00–0.50)

## 2022-01-31 LAB — COMPREHENSIVE METABOLIC PANEL
ALT: 52 U/L — ABNORMAL HIGH (ref 0–44)
AST: 63 U/L — ABNORMAL HIGH (ref 15–41)
Albumin: 3.5 g/dL (ref 3.5–5.0)
Alkaline Phosphatase: 72 U/L (ref 38–126)
Anion gap: 7 (ref 5–15)
BUN: 18 mg/dL (ref 8–23)
CO2: 29 mmol/L (ref 22–32)
Calcium: 8.2 mg/dL — ABNORMAL LOW (ref 8.9–10.3)
Chloride: 103 mmol/L (ref 98–111)
Creatinine, Ser: 1.18 mg/dL (ref 0.61–1.24)
GFR, Estimated: >60 mL/min (ref >60)
Glucose, Bld: 114 mg/dL — ABNORMAL HIGH (ref 70–99)
Potassium: 3.2 mmol/L — ABNORMAL LOW (ref 3.5–5.1)
Sodium: 139 mmol/L (ref 135–145)
Total Bilirubin: 1 mg/dL (ref 0.3–1.2)
Total Protein: 6.4 g/dL — ABNORMAL LOW (ref 6.5–8.1)

## 2022-01-31 LAB — CBC WITH DIFFERENTIAL/PLATELET
Abs Immature Granulocytes: 0.01 10*3/uL (ref 0.00–0.07)
Basophils Absolute: 0 10*3/uL (ref 0.0–0.1)
Basophils Relative: 0 %
Eosinophils Absolute: 0 10*3/uL (ref 0.0–0.5)
Eosinophils Relative: 1 %
HCT: 37.8 % — ABNORMAL LOW (ref 39.0–52.0)
Hemoglobin: 13.1 g/dL (ref 13.0–17.0)
Immature Granulocytes: 0 %
Lymphocytes Relative: 29 %
Lymphs Abs: 1.5 10*3/uL (ref 0.7–4.0)
MCH: 33.6 pg (ref 26.0–34.0)
MCHC: 34.7 g/dL (ref 30.0–36.0)
MCV: 96.9 fL (ref 80.0–100.0)
Monocytes Absolute: 0.7 10*3/uL (ref 0.1–1.0)
Monocytes Relative: 13 %
Neutro Abs: 3 10*3/uL (ref 1.7–7.7)
Neutrophils Relative %: 57 %
Platelets: 142 10*3/uL — ABNORMAL LOW (ref 150–400)
RBC: 3.9 MIL/uL — ABNORMAL LOW (ref 4.22–5.81)
RDW: 14 % (ref 11.5–15.5)
WBC: 5.2 10*3/uL (ref 4.0–10.5)
nRBC: 0 % (ref 0.0–0.2)

## 2022-01-31 LAB — PHOSPHORUS: Phosphorus: 3.8 mg/dL (ref 2.5–4.6)

## 2022-01-31 LAB — FERRITIN: Ferritin: 157 ng/mL (ref 24–336)

## 2022-01-31 LAB — C-REACTIVE PROTEIN: CRP: 4 mg/dL — ABNORMAL HIGH (ref <1.0)

## 2022-01-31 LAB — MAGNESIUM: Magnesium: 2 mg/dL (ref 1.7–2.4)

## 2022-01-31 MED ORDER — POLYETHYLENE GLYCOL 3350 17 G PO PACK
17.0000 g | PACK | Freq: Every day | ORAL | Status: DC | PRN
  Administered 2022-02-01: 17 g via ORAL
  Filled 2022-01-31: qty 1

## 2022-01-31 MED ORDER — MENTHOL 3 MG MT LOZG
1.0000 | LOZENGE | OROMUCOSAL | Status: DC | PRN
  Administered 2022-01-31: 3 mg via ORAL
  Filled 2022-01-31 (×2): qty 9

## 2022-01-31 MED ORDER — NIRMATRELVIR/RITONAVIR (PAXLOVID)TABLET
3.0000 | ORAL_TABLET | Freq: Two times a day (BID) | ORAL | Status: DC
  Administered 2022-01-31 – 2022-02-01 (×3): 3 via ORAL
  Filled 2022-01-31: qty 30

## 2022-01-31 MED ORDER — MIRTAZAPINE 15 MG PO TABS
7.5000 mg | ORAL_TABLET | Freq: Every day | ORAL | Status: DC
  Administered 2022-01-31: 7.5 mg via ORAL

## 2022-01-31 MED ORDER — POTASSIUM CHLORIDE CRYS ER 20 MEQ PO TBCR
40.0000 meq | EXTENDED_RELEASE_TABLET | ORAL | Status: AC
  Administered 2022-01-31 (×2): 40 meq via ORAL
  Filled 2022-01-31 (×2): qty 2

## 2022-01-31 NOTE — Progress Notes (Signed)
PROGRESS NOTE    James Gibbs  KLK:[id number] DOB: Aug 13, 1955 DOA: 01/29/2022 PCP: Greta Doom, MD     Brief Narrative:  James Gibbs is a 66 y.o. male with medical history significant for hypertension, mitral valve prolapse, and multiple sclerosis, now presenting to the emergency department with generalized weakness.  Patient recently developed a mild nonproductive cough and increased weakness.  He noticed that he was having increased difficulty ambulating with his walker 2 days ago, went on to become progressively weak in general and with increased right leg weakness.  Overnight, he got up to use the bathroom, was too weak to bear weight and slid to the floor.  He was unable to get up until EMS arrived to assist him.   COVID-19 PCR is positive.  Chest x-ray is negative for acute cardiopulmonary disease.  MRI brain is negative for acute intracranial abnormality and there does not appear to be any active demyelination on MRI of the cervical or thoracic spine.   He was started on Remdesivir due to his risk factor to progressed to severe disease. This was changed to Paxlovid.   New events last 24 hours / Subjective: Patient has cough with clear sputum.  Denies worsening shortness of breath.  He remained stable on room air.  He had a fever last night 100.5.  Continues to be very weak, especially his right lower extremity which has been ongoing due to his MS.  PT has recommended home health therapy on discharge.  Patient is also asking for cough drops  Assessment & Plan:   Principal Problem:   COVID-19 virus infection Active Problems:   HTN (hypertension)   Multiple sclerosis (HCC)   COVID-19   COVID-19 -Continue Paxlovid -Remains stable on room air -D-dimer is elevated but Wells criteria risk stratification is low probability for PE.  Continue to trend D-dimer levels  Elevated LFT -Stop Remdesivir, monitor  MS -Follow-up with neurology outpatient -MRI without active  demyelination -PT recommending home health -Can continue home MS medications  Hypertension -Norvasc, lisinopril  Hyperlipidemia -Lipitor  Hypokalemia -Replace, trend -Magnesium 2.0  BPH -Proscar, Flomax    DVT prophylaxis:  Place and maintain sequential compression device Start: 01/30/22 1038 He declined Lovenox due to his history of GI bleeding Code Status: Full code Family Communication: No family at bedside Disposition Plan:  Status is: Inpatient Remains inpatient appropriate because: Continue to monitor fever, lab work including D-dimer and LFTs  Antimicrobials:  Anti-infectives (From admission, onward)    Start     Dose/Rate Route Frequency Ordered Stop   01/31/22 1015  nirmatrelvir/ritonavir EUA (PAXLOVID) 3 tablet        3 tablet Oral 2 times daily 01/31/22 0926 02/05/22 0959   01/31/22 1000  remdesivir 100 mg in sodium chloride 0.9 % 100 mL IVPB  Status:  Discontinued       See Hyperspace for full Linked Orders Report.   100 mg 200 mL/hr over 30 Minutes Intravenous Daily 01/30/22 0226 01/31/22 0926   01/30/22 0300  remdesivir 200 mg in sodium chloride 0.9% 250 mL IVPB       See Hyperspace for full Linked Orders Report.   200 mg 580 mL/hr over 30 Minutes Intravenous Once 01/30/22 0226 01/30/22 0452        Objective: Vitals:   01/30/22 1043 01/30/22 1548 01/30/22 2021 01/31/22 0652  BP: (!) 145/77 (!) 150/90 131/66 129/84  Pulse: 84 89 97 79  Resp: 18 (!) 21 18 18   Temp: 98.3  F (36.8 C) 99.6 F (37.6 C) (!) 100.5 F (38.1 C) 98.3 F (36.8 C)  TempSrc: Oral Oral Oral Oral  SpO2: 99% 97% 99% 99%  Weight:      Height:        Intake/Output Summary (Last 24 hours) at 01/31/2022 1120 Last data filed at 01/31/2022 0915 Gross per 24 hour  Intake 480 ml  Output 1000 ml  Net -520 ml   Filed Weights   01/29/22 1416 01/30/22 0400  Weight: 103 kg 102.1 kg    Examination:  General exam: Appears calm and comfortable  Respiratory system: Clear to  auscultation. Respiratory effort normal. No respiratory distress. No conversational dyspnea.  On room air Cardiovascular system: S1 & S2 heard, RRR. No murmurs. No pedal edema, without tenderness to palpation. Gastrointestinal system: Abdomen is nondistended, soft and nontender. Normal bowel sounds heard. Central nervous system: Alert and oriented. No focal neurological deficits. Speech clear.  Extremities: Symmetric in appearance  Skin: No rashes, lesions or ulcers on exposed skin  Psychiatry: Judgement and insight appear normal. Mood & affect appropriate.   Data Reviewed: I have personally reviewed following labs and imaging studies  CBC: Recent Labs  Lab 01/29/22 1425 01/31/22 0537  WBC 8.8 5.2  NEUTROABS 7.5 3.0  HGB 15.2 13.1  HCT 45.2 37.8*  MCV 96.4 96.9  PLT 161 142*   Basic Metabolic Panel: Recent Labs  Lab 01/29/22 1425 01/31/22 0537  NA 142 139  K 3.5 3.2*  CL 104 103  CO2 29 29  GLUCOSE 107* 114*  BUN 12 18  CREATININE 1.20 1.18  CALCIUM 9.8 8.2*  MG  --  2.0  PHOS  --  3.8   GFR: Estimated Creatinine Clearance: 72.9 mL/min (by C-G formula based on SCr of 1.18 mg/dL). Liver Function Tests: Recent Labs  Lab 01/29/22 1425 01/31/22 0537  AST 39 63*  ALT 47* 52*  ALKPHOS 95 72  BILITOT 1.8* 1.0  PROT 8.1 6.4*  ALBUMIN 4.8 3.5   No results for input(s): "LIPASE", "AMYLASE" in the last 168 hours. No results for input(s): "AMMONIA" in the last 168 hours. Coagulation Profile: Recent Labs  Lab 01/29/22 1425  INR 1.1   Cardiac Enzymes: Recent Labs  Lab 01/29/22 1425  CKTOTAL 372   BNP (last 3 results) No results for input(s): "PROBNP" in the last 8760 hours. HbA1C: No results for input(s): "HGBA1C" in the last 72 hours. CBG: No results for input(s): "GLUCAP" in the last 168 hours. Lipid Profile: No results for input(s): "CHOL", "HDL", "LDLCALC", "TRIG", "CHOLHDL", "LDLDIRECT" in the last 72 hours. Thyroid Function Tests: No results for  input(s): "TSH", "T4TOTAL", "FREET4", "T3FREE", "THYROIDAB" in the last 72 hours. Anemia Panel: Recent Labs    01/31/22 0537  FERRITIN 157   Sepsis Labs: Recent Labs  Lab 01/29/22 1425 01/29/22 1630 01/30/22 0537  PROCALCITON  --   --  <0.10  LATICACIDVEN 1.6 1.1  --     Recent Results (from the past 240 hour(s))  Blood Culture (routine x 2)     Status: None (Preliminary result)   Collection Time: 01/29/22  2:25 PM   Specimen: BLOOD  Result Value Ref Range Status   Specimen Description   Final    BLOOD SITE NOT SPECIFIED Performed at [facility], [address], [place], [place] [address]    Special Requests   Final    BOTTLES DRAWN AEROBIC AND ANAEROBIC Blood Culture adequate volume Performed at [facility], [address] [place], [place] [address]    Culture   Final    NO GROWTH 2 DAYS Performed at [facility], [address], [place], [place] [address]    Report Status PENDING  Incomplete  Blood Culture (routine x 2)     Status: None (Preliminary result)   Collection Time: 01/29/22  2:50 PM   Specimen: BLOOD  Result Value Ref Range Status   Specimen Description   Final    BLOOD SITE NOT SPECIFIED Performed at [facility], [address] [place], [place] [address]    Special Requests   Final    BOTTLES DRAWN AEROBIC AND ANAEROBIC Blood Culture results may not be optimal due to an inadequate volume of blood received in culture bottles Performed at [facility], [address] [place], [place] [address]    Culture   Final    NO GROWTH 2 DAYS Performed at [facility], [address], [place], [place] [address]    Report Status PENDING  Incomplete  SARS Coronavirus 2 by RT PCR (hospital order, performed in [facility] hospital lab) *cepheid single result test* Anterior Nasal Swab     Status: Abnormal   Collection Time: 01/29/22  4:23 PM   Specimen: Anterior Nasal Swab   Result Value Ref Range Status   SARS Coronavirus 2 by RT PCR POSITIVE (A) NEGATIVE Final    Comment: (NOTE) SARS-CoV-2 target nucleic acids are DETECTED  SARS-CoV-2 RNA is generally detectable in upper respiratory specimens  during the acute phase of infection.  Positive results are indicative  of the presence of the identified virus, but do not rule out bacterial infection or co-infection with other pathogens not detected by the test.  Clinical correlation with patient history and  other diagnostic information is necessary to determine patient infection status.  The expected result is negative.  Fact Sheet for Patients:   RoadLapTop.co.za   Fact Sheet for Healthcare Providers:   http://kim-miller.com/    This test is not yet approved or cleared by the [place] FDA and  has been authorized for detection and/or diagnosis of SARS-CoV-2 by FDA under an Emergency Use Authorization (EUA).  This EUA will remain in effect (meaning this test can be used) for the duration of  the COVID-19 declaration under Section 564(b)(1)  of the Act, 21 U.S.C. section 360-bbb-3(b)(1), unless the authorization is terminated or revoked sooner.   Performed at [facility], [address] [place], [place] [address]   Urine Culture     Status: Abnormal   Collection Time: 01/29/22  5:10 PM   Specimen: In/Out Cath Urine  Result Value Ref Range Status   Specimen Description   Final    IN/OUT CATH URINE Performed at [facility], [address] [place], [place] [address]    Special Requests   Final    NONE Performed at [facility], [address] [place], [place] [address]    Culture MULTIPLE SPECIES PRESENT, SUGGEST RECOLLECTION (A)  Final   Report Status 01/30/2022 FINAL  Final      Radiology Studies: MR THORACIC SPINE W WO CONTRAST  Result Date: 01/30/2022 CLINICAL DATA:  Ataxia EXAM:  MRI CERVICAL AND THORACIC SPINE WITHOUT AND WITH CONTRAST TECHNIQUE: Multiplanar and multiecho pulse sequences of the cervical spine, to include the craniocervical junction and cervicothoracic junction, and the thoracic spine, were obtained without and with intravenous contrast. CONTRAST:  10mL GADAVIST GADOBUTROL  1 MMOL/ML IV SOLN COMPARISON:  No prior MRI of the cervical and thoracic spine available FINDINGS: MRI CERVICAL SPINE FINDINGS Alignment: Straightening and mild reversal of the normal cervical lordosis. No listhesis. Vertebrae: No acute fracture or suspicious osseous lesion. No abnormal enhancement. Endplate degenerative changes. 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 The spinal cord appears somewhat diminutive overall. Otherwise normal morphology and signal. No abnormal enhancement. Posterior Fossa, vertebral arteries, paraspinal tissues: Negative. Disc levels: C2-C3: Mild disc bulge. No spinal canal stenosis or neural foraminal narrowing. C3-C4: Disc height loss with disc osteophyte complex. No spinal canal stenosis or neural foraminal narrowing. C4-C5: Disc height loss and mild disc bulge. Facet and uncovertebral hypertrophy. No spinal canal stenosis. Mild bilateral neural foraminal narrowing. C5-C6: Disc height loss with central/left paracentral disc extrusion with 5 mm of caudal migration, which indents the ventral spinal cord and causes cord flattening. Moderate spinal canal stenosis. Mild right and moderate left neural foraminal narrowing. C6-C7: Disc height loss without significant disc bulge. Uncovertebral hypertrophy. No spinal canal stenosis. Mild left neural foraminal narrowing. C7-T1: No significant disc bulge. Facet arthropathy. No spinal canal stenosis. Mild right-greater-than-left neural foraminal narrowing. MRI THORACIC SPINE FINDINGS Alignment: S shaped curvature of the  thoracic spine. Preservation of the normal thoracic kyphosis. No significant listhesis. Vertebrae: No acute fracture or suspicious osseous lesion. Endplate degenerative changes and Schmorl's node formation at the inferior endplate of T5. Contrast enhancement and increased T2 signal likely reflect edema associated with the Schmorl's node. Cord:  Normal morphology and signal.  No abnormal enhancement. Paraspinal and other soft tissues: Evaluation is somewhat limited by motion artifact. Small bilateral pleural effusions. Bilateral renal cysts, for which no follow-up is currently indicated. Significant fatty atrophy of the paraspinous musculature beginning at the level of T10 T10 (series 20, image 45 and series 19, image 17). Disc levels: No significant spinal canal stenosis. Facet arthropathy causes moderate left neural foraminal narrowing at T10-T11 and mild right neural foraminal narrowing at T11-T12. IMPRESSION: CERVICAL SPINE: 1. 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 The cervical spinal cord appear somewhat diminutive overall but is otherwise normal in morphology and signal, without abnormal enhancement. 2. C5-C6 moderate spinal canal stenosis with moderate left and mild right neural foraminal narrowing. 3. Mild neural foraminal narrowing bilaterally at C4-C5 and C7-T1, as well as on left at C6-C7. THORACIC SPINE: 1. Significant fatty atrophy of the paraspinous musculature extending inferiorly from the level of T10. 2. No significant spinal canal stenosis. No abnormal cord signal or enhancement. 3. T10-T11 moderate left neural foraminal narrowing at T11-T12 mild right neural foraminal narrowing. 4. Small bilateral pleural effusions. Electronically Signed   By: Wiliam Ke M.D.   On: 01/30/2022 00:53   MR Cervical Spine W or Wo Contrast  Result Date: 01/30/2022 CLINICAL DATA:  Ataxia EXAM: MRI CERVICAL AND THORACIC SPINE  WITHOUT AND WITH CONTRAST TECHNIQUE: Multiplanar and multiecho pulse sequences of the cervical spine, to include the craniocervical junction and cervicothoracic junction, and the thoracic spine, were obtained without and with intravenous contrast. CONTRAST:  74mL GADAVIST GADOBUTROL 1 MMOL/ML IV SOLN COMPARISON:  No prior MRI of the cervical and thoracic spine available FINDINGS: MRI CERVICAL SPINE FINDINGS Alignment: Straightening and mild reversal of the normal cervical lordosis. No listhesis. Vertebrae: No acute fracture or suspicious osseous lesion. No abnormal  enhancement. Endplate degenerative changes. 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 The spinal cord appears somewhat diminutive overall. Otherwise normal morphology and signal. No abnormal enhancement. Posterior Fossa, vertebral arteries, paraspinal tissues: Negative. Disc levels: C2-C3: Mild disc bulge. No spinal canal stenosis or neural foraminal narrowing. C3-C4: Disc height loss with disc osteophyte complex. No spinal canal stenosis or neural foraminal narrowing. C4-C5: Disc height loss and mild disc bulge. Facet and uncovertebral hypertrophy. No spinal canal stenosis. Mild bilateral neural foraminal narrowing. C5-C6: Disc height loss with central/left paracentral disc extrusion with 5 mm of caudal migration, which indents the ventral spinal cord and causes cord flattening. Moderate spinal canal stenosis. Mild right and moderate left neural foraminal narrowing. C6-C7: Disc height loss without significant disc bulge. Uncovertebral hypertrophy. No spinal canal stenosis. Mild left neural foraminal narrowing. C7-T1: No significant disc bulge. Facet arthropathy. No spinal canal stenosis. Mild right-greater-than-left neural foraminal narrowing. MRI THORACIC SPINE FINDINGS Alignment: S shaped curvature of the thoracic spine. Preservation of the  normal thoracic kyphosis. No significant listhesis. Vertebrae: No acute fracture or suspicious osseous lesion. Endplate degenerative changes and Schmorl's node formation at the inferior endplate of T5. Contrast enhancement and increased T2 signal likely reflect edema associated with the Schmorl's node. Cord:  Normal morphology and signal.  No abnormal enhancement. Paraspinal and other soft tissues: Evaluation is somewhat limited by motion artifact. Small bilateral pleural effusions. Bilateral renal cysts, for which no follow-up is currently indicated. Significant fatty atrophy of the paraspinous musculature beginning at the level of T10 T10 (series 20, image 45 and series 19, image 17). Disc levels: No significant spinal canal stenosis. Facet arthropathy causes moderate left neural foraminal narrowing at T10-T11 and mild right neural foraminal narrowing at T11-T12. IMPRESSION: CERVICAL SPINE: 1. 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 The cervical spinal cord appear somewhat diminutive overall but is otherwise normal in morphology and signal, without abnormal enhancement. 2. C5-C6 moderate spinal canal stenosis with moderate left and mild right neural foraminal narrowing. 3. Mild neural foraminal narrowing bilaterally at C4-C5 and C7-T1, as well as on left at C6-C7. THORACIC SPINE: 1. Significant fatty atrophy of the paraspinous musculature extending inferiorly from the level of T10. 2. No significant spinal canal stenosis. No abnormal cord signal or enhancement. 3. T10-T11 moderate left neural foraminal narrowing at T11-T12 mild right neural foraminal narrowing. 4. Small bilateral pleural effusions. Electronically Signed   By: Wiliam Ke M.D.   On: 01/30/2022 00:53   MR Brain W and Wo Contrast  Result Date: 01/30/2022 CLINICAL DATA:  Mental status change EXAM: MRI HEAD WITHOUT AND WITH CONTRAST TECHNIQUE: Multiplanar,  multiecho pulse sequences of the brain and surrounding structures were obtained without and with intravenous contrast. CONTRAST:  47mL GADAVIST GADOBUTROL 1 MMOL/ML IV SOLN COMPARISON:  No prior MRI, correlation is made with 12/16/2018 CT head FINDINGS: Brain: No restricted diffusion to suggest acute or subacute infarct. No acute hemorrhage, mass, mass effect, or midline shift. No hemosiderin deposition to suggest remote hemorrhage. No extra-axial collection. Redemonstrated ventricular and sulcal enlargement, likely reflecting advanced cerebral atrophy for age. T2 hyperintense signal in the periventricular white matter, likely the sequela of moderate chronic small vessel ischemic disease. Left lentiform nucleus remote lacunar infarct. No abnormal parenchymal or meningeal enhancement. Vascular: Normal arterial flow voids. Normal arterial and venous enhancement. Skull  and upper cervical spine: Normal marrow signal. Sinuses/Orbits: Mucous retention cysts in the maxillary sinuses and left ethmoid air cells. The orbits are unremarkable. Other: The mastoids are well aerated. IMPRESSION: 1. No acute intracranial process. No evidence of acute or subacute infarct. 2. Advanced cerebral atrophy for age, with the sequela of moderate chronic small vessel ischemic disease. Electronically Signed   By: Wiliam Ke M.D.   On: 01/30/2022 00:35   DG Chest Port 1 View  Result Date: 01/29/2022 CLINICAL DATA:  w questionable sepsis in a 66 year old male. EXAM: PORTABLE CHEST 1 VIEW COMPARISON:  December 13, 2018. FINDINGS: EKG leads project over the chest. Cardiomediastinal contours and hilar structures are normal. Lungs are clear. No sign of effusion or pneumothorax. On limited assessment no acute skeletal finding. IMPRESSION: No acute cardiopulmonary disease. Electronically Signed   By: Donzetta Kohut M.D.   On: 01/29/2022 15:22      Scheduled Meds:  amLODipine  10 mg Oral Daily   vitamin C  500 mg Oral BID   atorvastatin  80  mg Oral QHS   cholecalciferol  400 Units Oral Daily   dalfampridine  10 mg Oral Q12H   Dimethyl Fumarate  240 mg Oral BID   feeding supplement  237 mL Oral BID BM   finasteride  5 mg Oral Daily   lisinopril  40 mg Oral Daily   nirmatrelvir/ritonavir EUA  3 tablet Oral BID   potassium chloride  40 mEq Oral Q4H   senna-docusate  2 tablet Oral BID   tamsulosin  0.8 mg Oral Daily   Continuous Infusions:   LOS: 1 day     Noralee Stain, DO [facility] 01/31/2022, 11:20 AM   Available via Epic secure chat 7am-7pm After these hours, please refer to coverage provider listed on [email or web address]

## 2022-01-31 NOTE — TOC Progression Note (Signed)
Transition of Care [facility]) - Progression Note    Patient Details  Name: James Gibbs MRN: [id number] Date of Birth: April 07, 1956  [facility]) CM/SW Contact  Chin Wachter, Olegario Messier, RN Phone Number: 01/31/2022, 4:04 PM  Clinical Narrative: [facility] for HHPT. Family has transport home. Continue to monitor.      Expected Discharge Plan: Home w Home Health Services Barriers to Discharge: Continued Medical Work up  Expected Discharge Plan and Services Expected Discharge Plan: Home w Home Health Services                                               Social Determinants of Health (SDOH) Interventions    Readmission Risk Interventions     No data to display

## 2022-01-31 NOTE — Progress Notes (Signed)
Physical Therapy Treatment Patient Details Name: James Gibbs MRN: [id number] DOB: [birth year] Today's Date: 01/31/2022   History of Present Illness James Gibbs is a 66 y.o. male presenting to the emergency department with generalized weakness.  Patient recently developed a mild nonproductive cough and increased weakness.  He noticed that he was having increased difficulty ambulating with his walker 2 days ago, went on to become progressively weak in general and with increased right leg weakness.  Overnight, he got up to use the bathroom, was too weak to bear weight and slid to the floor. COVID-19 PCR is positive.  Chest x-ray is negative for acute cardiopulmonary disease.  MRI brain is negative for acute intracranial abnormality and there does not appear to be any active demyelination on MRI of the cervical or thoracic spine. PMH significant for hypertension, mitral valve prolapse, and multiple sclerosis.    PT Comments    Pt very loquacious but agreeable to mobilize.  Pt reports his R LE weakness has been present for 6-7 years.  Pt pleased at ability to ambulate short distance in room today.  Pt agreeable to remain in recliner end of session and provided flutter device which pt was working on upon therapist departing room. Pt reports previously being very independent and would benefit from staff assisting him with mobility daily.     Recommendations for follow up therapy are one component of a multi-disciplinary discharge planning process, led by the attending physician.  Recommendations may be updated based on patient status, additional functional criteria and insurance authorization.  Follow Up Recommendations  Home health PT     Assistance Recommended at Discharge Intermittent Supervision/Assistance  Patient can return home with the following A little help with walking and/or transfers;A little help with bathing/dressing/bathroom;Assistance with cooking/housework;Direct  supervision/assist for medications management;Help with stairs or ramp for entrance;Assist for transportation   Equipment Recommendations  None recommended by PT    Recommendations for Other Services       Precautions / Restrictions Precautions Precautions: Fall Restrictions Weight Bearing Restrictions: No     Mobility  Bed Mobility Overal bed mobility: Needs Assistance Bed Mobility: Supine to Sit     Supine to sit: Mod assist, HOB elevated     General bed mobility comments: required assist for trunk upright    Transfers Overall transfer level: Needs assistance Equipment used: Rolling walker (2 wheels) Transfers: Sit to/from Stand Sit to Stand: Min assist, From elevated surface           General transfer comment: light assist to rise from bed, pt required use of UEs to assist with rise, cues for technique and extension    Ambulation/Gait Ambulation/Gait assistance: Min guard, Min assist Gait Distance (Feet): 22 Feet Assistive device: Rolling walker (2 wheels) Gait Pattern/deviations: Step-through pattern, Decreased stride length, Knee flexed in stance - left, Knee flexed in stance - right, Trunk flexed       General Gait Details: cues for sequencing and posture; pt reports R LE weakness however no buckling observed today, SPO2 100% on room air and HR 105 bpm upon sitting in recliner, pt denies SOB   Stairs             Wheelchair Mobility    Modified Rankin (Stroke Patients Only)       Balance           Standing balance support: Bilateral upper extremity supported, During functional activity Standing balance-Leahy Scale: Poor  Cognition Arousal/Alertness: Awake/alert Behavior During Therapy: WFL for tasks assessed/performed Overall Cognitive Status: Within Functional Limits for tasks assessed                                          Exercises      General Comments         Pertinent Vitals/Pain Pain Assessment Pain Assessment: No/denies pain    Home Living                          Prior Function            PT Goals (current goals can now be found in the care plan section) Progress towards PT goals: Progressing toward goals    Frequency    Min 3X/week      PT Plan Current plan remains appropriate    Co-evaluation              AM-PAC PT "6 Clicks" Mobility   Outcome Measure  Help needed turning from your back to your side while in a flat bed without using bedrails?: A Little Help needed moving from lying on your back to sitting on the side of a flat bed without using bedrails?: A Lot Help needed moving to and from a bed to a chair (including a wheelchair)?: A Little Help needed standing up from a chair using your arms (e.g., wheelchair or bedside chair)?: A Little Help needed to walk in hospital room?: A Little Help needed climbing 3-5 steps with a railing? : A Lot 6 Click Score: 16    End of Session Equipment Utilized During Treatment: Gait belt Activity Tolerance: Patient tolerated treatment well Patient left: in chair;with call bell/phone within reach Nurse Communication: Mobility status (aware pt not on chair alarm, pt agreeable to use call bell for assist, prevent falls) PT Visit Diagnosis: Muscle weakness (generalized) (M62.81);Other abnormalities of gait and mobility (R26.89)     Time: 3875-6433 PT Time Calculation (min) (ACUTE ONLY): 25 min  Charges:  $Gait Training: 8-22 mins                    Thomasene Mohair PT, DPT Physical Therapist Acute Rehabilitation Services Preferred contact method: Secure Chat Weekend Pager Only: [phone] Office: [phone]    Kati L Payson 01/31/2022, 11:20 AM

## 2022-02-01 DIAGNOSIS — U071 COVID-19: Secondary | ICD-10-CM | POA: Diagnosis not present

## 2022-02-01 LAB — COMPREHENSIVE METABOLIC PANEL
ALT: 75 U/L — ABNORMAL HIGH (ref 0–44)
AST: 101 U/L — ABNORMAL HIGH (ref 15–41)
Albumin: 3.5 g/dL (ref 3.5–5.0)
Alkaline Phosphatase: 75 U/L (ref 38–126)
Anion gap: 6 (ref 5–15)
BUN: 16 mg/dL (ref 8–23)
CO2: 27 mmol/L (ref 22–32)
Calcium: 8.3 mg/dL — ABNORMAL LOW (ref 8.9–10.3)
Chloride: 105 mmol/L (ref 98–111)
Creatinine, Ser: 1.02 mg/dL (ref 0.61–1.24)
GFR, Estimated: >60 mL/min (ref >60)
Glucose, Bld: 131 mg/dL — ABNORMAL HIGH (ref 70–99)
Potassium: 3.4 mmol/L — ABNORMAL LOW (ref 3.5–5.1)
Sodium: 138 mmol/L (ref 135–145)
Total Bilirubin: 0.8 mg/dL (ref 0.3–1.2)
Total Protein: 6.3 g/dL — ABNORMAL LOW (ref 6.5–8.1)

## 2022-02-01 LAB — FERRITIN: Ferritin: 235 ng/mL (ref 24–336)

## 2022-02-01 LAB — CBC WITH DIFFERENTIAL/PLATELET
Abs Immature Granulocytes: 0.01 10*3/uL (ref 0.00–0.07)
Basophils Absolute: 0 10*3/uL (ref 0.0–0.1)
Basophils Relative: 1 %
Eosinophils Absolute: 0.1 10*3/uL (ref 0.0–0.5)
Eosinophils Relative: 3 %
HCT: 37.5 % — ABNORMAL LOW (ref 39.0–52.0)
Hemoglobin: 12.7 g/dL — ABNORMAL LOW (ref 13.0–17.0)
Immature Granulocytes: 0 %
Lymphocytes Relative: 29 %
Lymphs Abs: 1.2 10*3/uL (ref 0.7–4.0)
MCH: 32.6 pg (ref 26.0–34.0)
MCHC: 33.9 g/dL (ref 30.0–36.0)
MCV: 96.4 fL (ref 80.0–100.0)
Monocytes Absolute: 0.4 10*3/uL (ref 0.1–1.0)
Monocytes Relative: 9 %
Neutro Abs: 2.4 10*3/uL (ref 1.7–7.7)
Neutrophils Relative %: 58 %
Platelets: 141 10*3/uL — ABNORMAL LOW (ref 150–400)
RBC: 3.89 MIL/uL — ABNORMAL LOW (ref 4.22–5.81)
RDW: 14.1 % (ref 11.5–15.5)
WBC: 4.1 10*3/uL (ref 4.0–10.5)
nRBC: 0 % (ref 0.0–0.2)

## 2022-02-01 LAB — MAGNESIUM: Magnesium: 2.2 mg/dL (ref 1.7–2.4)

## 2022-02-01 LAB — D-DIMER, QUANTITATIVE: D-Dimer, Quant: 1.19 ug/mL-FEU — ABNORMAL HIGH (ref 0.00–0.50)

## 2022-02-01 LAB — C-REACTIVE PROTEIN: CRP: 1.6 mg/dL — ABNORMAL HIGH (ref <1.0)

## 2022-02-01 LAB — PHOSPHORUS: Phosphorus: 4.3 mg/dL (ref 2.5–4.6)

## 2022-02-01 MED ORDER — POTASSIUM CHLORIDE CRYS ER 20 MEQ PO TBCR
40.0000 meq | EXTENDED_RELEASE_TABLET | Freq: Once | ORAL | Status: AC
Start: 2022-02-01 — End: 2022-02-01
  Administered 2022-02-01: 40 meq via ORAL
  Filled 2022-02-01: qty 2

## 2022-02-01 NOTE — Discharge Instructions (Signed)

## 2022-02-01 NOTE — Progress Notes (Signed)
Admin PRN miralax

## 2022-02-01 NOTE — Plan of Care (Signed)

## 2022-02-01 NOTE — Discharge Summary (Signed)
Physician Discharge Summary  James Gibbs:[id number] DOB: [birth year] DOA: 01/29/2022  PCP: Greta Doom, MD  Admit date: 01/29/2022 Discharge date: 02/01/2022  Admitted From: Home Disposition:  Home with home health   Recommendations for Outpatient Follow-up:  Follow up with PCP in 1 week Repeat LFT in 1 week.  Mildly elevated in setting of Remdesivir use Follow-up with neurology at the [facility]  Discharge Condition: Stable CODE STATUS: Full code Diet recommendation: Regular  Brief/Interim Summary: James Gibbs is a 66 y.o. male with medical history significant for hypertension, mitral valve prolapse, and multiple sclerosis, now presenting to the emergency department with generalized weakness.  Patient recently developed a mild nonproductive cough and increased weakness.  He noticed that he was having increased difficulty ambulating with his walker 2 days ago, went on to become progressively weak in general and with increased right leg weakness.  Overnight, he got up to use the bathroom, was too weak to bear weight and slid to the floor.  He was unable to get up until EMS arrived to assist him.   COVID-19 PCR is positive.  Chest x-ray is negative for acute cardiopulmonary disease.  MRI brain is negative for acute intracranial abnormality and there does not appear to be any active demyelination on MRI of the cervical or thoracic spine.   He was started on Remdesivir due to his risk factor to progressed to severe disease. This was changed to Paxlovid.  Patient remained stable.  D-dimer was mildly elevated but his Wells criteria risk for PE probability was low.  D-dimer trended downward.  Discharge Diagnoses:  Principal Problem:   COVID-19 virus infection Active Problems:   HTN (hypertension)   Multiple sclerosis (HCC)   COVID-19   COVID-19 -Initially started on Remdesivir this was transitioned to Paxlovid -Remains stable on room air -D-dimer is elevated but Wells criteria risk  stratification is low probability for PE.  D-dimer trended down   Elevated LFT -Stop Remdesivir, monitor -Repeat labs as outpatient   MS -Follow-up with neurology outpatient -MRI without active demyelination -PT recommending home health -Can continue home MS medications   Hypertension -Norvasc, lisinopril   Hyperlipidemia -Lipitor   Hypokalemia -Replace   BPH -Proscar, Flomax  Discharge Instructions  Discharge Instructions     Call MD for:  difficulty breathing, headache or visual disturbances   Complete by: As directed    Call MD for:  extreme fatigue   Complete by: As directed    Call MD for:  hives   Complete by: As directed    Call MD for:  persistant dizziness or light-headedness   Complete by: As directed    Call MD for:  persistant nausea and vomiting   Complete by: As directed    Call MD for:  severe uncontrolled pain   Complete by: As directed    Call MD for:  temperature >100.4   Complete by: As directed    Diet general   Complete by: As directed    Discharge instructions   Complete by: As directed    You were cared for by a hospitalist during your hospital stay. If you have any questions about your discharge medications or the care you received while you were in the hospital after you are discharged, you can call the unit and ask to speak with the hospitalist on call if the hospitalist that took care of you is not available. Once you are discharged, your primary care physician will handle any further medical issues.  Please note that NO REFILLS for any discharge medications will be authorized once you are discharged, as it is imperative that you return to your primary care physician (or establish a relationship with a primary care physician if you do not have one) for your aftercare needs so that they can reassess your need for medications and monitor your lab values.   Increase activity slowly   Complete by: As directed       Allergies as of 02/01/2022    No Known Allergies      Medication List     TAKE these medications    allopurinol 300 MG tablet Commonly known as: ZYLOPRIM Take 300 mg by mouth daily.   amLODipine 10 MG tablet Commonly known as: NORVASC Take 10 mg by mouth daily.   atorvastatin 80 MG tablet Commonly known as: LIPITOR Take 80 mg by mouth at bedtime.   carboxymethylcellulose 0.5 % Soln Commonly known as: REFRESH PLUS Place 1 drop into both eyes 4 (four) times daily.   cholecalciferol 10 MCG (400 UNIT) Tabs tablet Commonly known as: VITAMIN D3 Take 400 Units by mouth daily.   dalfampridine 10 MG Tb12 Take 1 tablet by mouth every 12 (twelve) hours.   finasteride 5 MG tablet Commonly known as: PROSCAR Take 5 mg by mouth daily.   Fish Oil 1000 MG Caps Take 1,000 mg by mouth daily.   lidocaine 5 % Commonly known as: LIDODERM Place 1 patch onto the skin daily. Remove & Discard patch within 12 hours or as directed by MD   lisinopril 40 MG tablet Commonly known as: ZESTRIL Take 40 mg by mouth daily.   METFORMIN HCL PO Take 500 mg by mouth daily.   MICROZIDE PO Take 25 mg by mouth daily.   multivitamin with minerals Tabs tablet Take 1 tablet by mouth daily.   pantoprazole 40 MG tablet Commonly known as: Protonix Take 1 tablet (40 mg total) by mouth daily. What changed: when to take this   polyethylene glycol 17 g packet Commonly known as: MIRALAX / GLYCOLAX Take 17 g by mouth 2 (two) times daily. What changed: when to take this   potassium chloride SA 20 MEQ tablet Commonly known as: KLOR-CON M Take 20 mEq by mouth daily.   senna-docusate 8.6-50 MG tablet Commonly known as: Senokot-S Take 1 tablet by mouth 2 (two) times daily. What changed: how much to take   tamsulosin 0.4 MG Caps capsule Commonly known as: FLOMAX Take 0.8 mg by mouth daily.   Tecfidera 240 MG Cpdr Generic drug: Dimethyl Fumarate Take 240 mg by mouth 2 (two) times daily.   vitamin C 100 MG tablet Take 500  mg by mouth in the morning and at bedtime.        Follow-up Information     [facility] Follow up.   Specialty: Home Health Services Why: [facility] physical therapy Contact information: [address] [place] [address] [phone]         Greta Doom, MD Follow up.   Specialty: Internal Medicine Contact information: [address] [place] [address] [phone]                No Known Allergies  Consultations: None   Procedures/Studies: MR THORACIC SPINE W WO CONTRAST  Result Date: 01/30/2022 CLINICAL DATA:  Ataxia EXAM: MRI CERVICAL AND THORACIC SPINE WITHOUT AND WITH CONTRAST TECHNIQUE: Multiplanar and multiecho pulse sequences of the cervical spine, to include the craniocervical  junction and cervicothoracic junction, and the thoracic spine, were obtained without and with intravenous contrast. CONTRAST:  47mL GADAVIST GADOBUTROL 1 MMOL/ML IV SOLN COMPARISON:  No prior MRI of the cervical and thoracic spine available FINDINGS: MRI CERVICAL SPINE FINDINGS Alignment: Straightening and mild reversal of the normal cervical lordosis. No listhesis. Vertebrae: No acute fracture or suspicious osseous lesion. No abnormal enhancement. Endplate degenerative changes. 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 The spinal cord appears somewhat diminutive overall. Otherwise normal morphology and signal. No abnormal enhancement. Posterior Fossa, vertebral arteries, paraspinal tissues: Negative. Disc levels: C2-C3: Mild disc bulge. No spinal canal stenosis or neural foraminal narrowing. C3-C4: Disc height loss with disc osteophyte complex. No spinal canal stenosis or neural foraminal narrowing. C4-C5: Disc height loss and mild disc bulge. Facet and uncovertebral hypertrophy. No spinal canal stenosis. Mild bilateral neural foraminal narrowing.  C5-C6: Disc height loss with central/left paracentral disc extrusion with 5 mm of caudal migration, which indents the ventral spinal cord and causes cord flattening. Moderate spinal canal stenosis. Mild right and moderate left neural foraminal narrowing. C6-C7: Disc height loss without significant disc bulge. Uncovertebral hypertrophy. No spinal canal stenosis. Mild left neural foraminal narrowing. C7-T1: No significant disc bulge. Facet arthropathy. No spinal canal stenosis. Mild right-greater-than-left neural foraminal narrowing. MRI THORACIC SPINE FINDINGS Alignment: S shaped curvature of the thoracic spine. Preservation of the normal thoracic kyphosis. No significant listhesis. Vertebrae: No acute fracture or suspicious osseous lesion. Endplate degenerative changes and Schmorl's node formation at the inferior endplate of T5. Contrast enhancement and increased T2 signal likely reflect edema associated with the Schmorl's node. Cord:  Normal morphology and signal.  No abnormal enhancement. Paraspinal and other soft tissues: Evaluation is somewhat limited by motion artifact. Small bilateral pleural effusions. Bilateral renal cysts, for which no follow-up is currently indicated. Significant fatty atrophy of the paraspinous musculature beginning at the level of T10 T10 (series 20, image 45 and series 19, image 17). Disc levels: No significant spinal canal stenosis. Facet arthropathy causes moderate left neural foraminal narrowing at T10-T11 and mild right neural foraminal narrowing at T11-T12. IMPRESSION: CERVICAL SPINE: 1. 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 The cervical spinal cord appear somewhat diminutive overall but is otherwise normal in morphology and signal, without abnormal enhancement. 2. C5-C6 moderate spinal canal stenosis with moderate left and mild right neural foraminal narrowing. 3. Mild neural foraminal  narrowing bilaterally at C4-C5 and C7-T1, as well as on left at C6-C7. THORACIC SPINE: 1. Significant fatty atrophy of the paraspinous musculature extending inferiorly from the level of T10. 2. No significant spinal canal stenosis. No abnormal cord signal or enhancement. 3. T10-T11 moderate left neural foraminal narrowing at T11-T12 mild right neural foraminal narrowing. 4. Small bilateral pleural effusions. Electronically Signed   By: Wiliam Ke M.D.   On: 01/30/2022 00:53   MR Cervical Spine W or Wo Contrast  Result Date: 01/30/2022 CLINICAL DATA:  Ataxia EXAM: MRI CERVICAL AND THORACIC SPINE WITHOUT AND WITH CONTRAST TECHNIQUE: Multiplanar and multiecho pulse sequences of the cervical spine, to include the craniocervical junction and cervicothoracic junction, and the thoracic spine, were obtained without and with intravenous contrast. CONTRAST:  60mL GADAVIST GADOBUTROL 1 MMOL/ML IV SOLN COMPARISON:  No prior MRI of the cervical and thoracic spine available FINDINGS: MRI CERVICAL SPINE FINDINGS Alignment: Straightening  and mild reversal of the normal cervical lordosis. No listhesis. Vertebrae: No acute fracture or suspicious osseous lesion. No abnormal enhancement. Endplate degenerative changes. 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 The spinal cord appears somewhat diminutive overall. Otherwise normal morphology and signal. No abnormal enhancement. Posterior Fossa, vertebral arteries, paraspinal tissues: Negative. Disc levels: C2-C3: Mild disc bulge. No spinal canal stenosis or neural foraminal narrowing. C3-C4: Disc height loss with disc osteophyte complex. No spinal canal stenosis or neural foraminal narrowing. C4-C5: Disc height loss and mild disc bulge. Facet and uncovertebral hypertrophy. No spinal canal stenosis. Mild bilateral neural foraminal narrowing. C5-C6: Disc height loss with  central/left paracentral disc extrusion with 5 mm of caudal migration, which indents the ventral spinal cord and causes cord flattening. Moderate spinal canal stenosis. Mild right and moderate left neural foraminal narrowing. C6-C7: Disc height loss without significant disc bulge. Uncovertebral hypertrophy. No spinal canal stenosis. Mild left neural foraminal narrowing. C7-T1: No significant disc bulge. Facet arthropathy. No spinal canal stenosis. Mild right-greater-than-left neural foraminal narrowing. MRI THORACIC SPINE FINDINGS Alignment: S shaped curvature of the thoracic spine. Preservation of the normal thoracic kyphosis. No significant listhesis. Vertebrae: No acute fracture or suspicious osseous lesion. Endplate degenerative changes and Schmorl's node formation at the inferior endplate of T5. Contrast enhancement and increased T2 signal likely reflect edema associated with the Schmorl's node. Cord:  Normal morphology and signal.  No abnormal enhancement. Paraspinal and other soft tissues: Evaluation is somewhat limited by motion artifact. Small bilateral pleural effusions. Bilateral renal cysts, for which no follow-up is currently indicated. Significant fatty atrophy of the paraspinous musculature beginning at the level of T10 T10 (series 20, image 45 and series 19, image 17). Disc levels: No significant spinal canal stenosis. Facet arthropathy causes moderate left neural foraminal narrowing at T10-T11 and mild right neural foraminal narrowing at T11-T12. IMPRESSION: CERVICAL SPINE: 1. 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 The cervical spinal cord appear somewhat diminutive overall but is otherwise normal in morphology and signal, without abnormal enhancement. 2. C5-C6 moderate spinal canal stenosis with moderate left and mild right neural foraminal narrowing. 3. Mild neural foraminal narrowing bilaterally at C4-C5 and  C7-T1, as well as on left at C6-C7. THORACIC SPINE: 1. Significant fatty atrophy of the paraspinous musculature extending inferiorly from the level of T10. 2. No significant spinal canal stenosis. No abnormal cord signal or enhancement. 3. T10-T11 moderate left neural foraminal narrowing at T11-T12 mild right neural foraminal narrowing. 4. Small bilateral pleural effusions. Electronically Signed   By: Wiliam Ke M.D.   On: 01/30/2022 00:53   MR Brain W and Wo Contrast  Result Date: 01/30/2022 CLINICAL DATA:  Mental status change EXAM: MRI HEAD WITHOUT AND WITH CONTRAST TECHNIQUE: Multiplanar, multiecho pulse sequences of the brain and surrounding structures were obtained without and with intravenous contrast. CONTRAST:  10mL GADAVIST GADOBUTROL 1 MMOL/ML IV SOLN COMPARISON:  No prior MRI, correlation is made with 12/16/2018 CT head FINDINGS: Brain: No restricted diffusion to suggest acute or subacute infarct. No acute hemorrhage, mass, mass effect, or midline shift. No hemosiderin deposition to suggest remote hemorrhage. No extra-axial collection. Redemonstrated ventricular and sulcal enlargement, likely reflecting advanced cerebral atrophy for age. T2 hyperintense signal in the periventricular white matter, likely the sequela of moderate chronic small vessel ischemic disease. Left lentiform nucleus  remote lacunar infarct. No abnormal parenchymal or meningeal enhancement. Vascular: Normal arterial flow voids. Normal arterial and venous enhancement. Skull and upper cervical spine: Normal marrow signal. Sinuses/Orbits: Mucous retention cysts in the maxillary sinuses and left ethmoid air cells. The orbits are unremarkable. Other: The mastoids are well aerated. IMPRESSION: 1. No acute intracranial process. No evidence of acute or subacute infarct. 2. Advanced cerebral atrophy for age, with the sequela of moderate chronic small vessel ischemic disease. Electronically Signed   By: Wiliam Ke M.D.   On: 01/30/2022  00:35   DG Chest Port 1 View  Result Date: 01/29/2022 CLINICAL DATA:  w questionable sepsis in a 66 year old male. EXAM: PORTABLE CHEST 1 VIEW COMPARISON:  December 13, 2018. FINDINGS: EKG leads project over the chest. Cardiomediastinal contours and hilar structures are normal. Lungs are clear. No sign of effusion or pneumothorax. On limited assessment no acute skeletal finding. IMPRESSION: No acute cardiopulmonary disease. Electronically Signed   By: Donzetta Kohut M.D.   On: 01/29/2022 15:22      Discharge Exam: Vitals:   02/01/22 0443 02/01/22 0828  BP: 136/83 131/82  Pulse: 79 73  Resp: 19   Temp: 98.2 F (36.8 C)   SpO2: 99% 100%    General: Pt is alert, awake, not in acute distress Cardiovascular: RRR, S1/S2 +, no edema Respiratory: CTA bilaterally, no wheezing, no rhonchi, no respiratory distress, no conversational dyspnea, on room air Abdominal: Soft, NT, ND, bowel sounds + Extremities: no edema, no cyanosis Psych: Normal mood and affect, stable judgement and insight     The results of significant diagnostics from this hospitalization (including imaging, microbiology, ancillary and laboratory) are listed below for reference.     Microbiology: Recent Results (from the past 240 hour(s))  Blood Culture (routine x 2)     Status: None (Preliminary result)   Collection Time: 01/29/22  2:25 PM   Specimen: BLOOD  Result Value Ref Range Status   Specimen Description   Final    BLOOD SITE NOT SPECIFIED Performed at [facility], [address], [place], [place] [address]    Special Requests   Final    BOTTLES DRAWN AEROBIC AND ANAEROBIC Blood Culture adequate volume Performed at [facility], [address], [place], [place] [address]    Culture   Final    NO GROWTH 3 DAYS Performed at [facility], [address] [place], [place] [address]    Report Status PENDING  Incomplete  Blood Culture (routine x 2)     Status: None  (Preliminary result)   Collection Time: 01/29/22  2:50 PM   Specimen: BLOOD  Result Value Ref Range Status   Specimen Description   Final    BLOOD SITE NOT SPECIFIED Performed at [facility], [address], [place], [place] [address]    Special Requests   Final    BOTTLES DRAWN AEROBIC AND ANAEROBIC Blood Culture results may not be optimal due to an inadequate volume of blood received in culture bottles Performed at [facility], [address], [place], [place] [address]    Culture   Final    NO GROWTH 3 DAYS Performed at [facility], [address], [place], [place] [address]    Report Status PENDING  Incomplete  SARS Coronavirus 2 by RT PCR (hospital order, performed in [facility] hospital lab) *cepheid single result test* Anterior Nasal Swab     Status: Abnormal  Collection Time: 01/29/22  4:23 PM   Specimen: Anterior Nasal Swab  Result Value Ref Range Status   SARS Coronavirus 2 by RT PCR POSITIVE (A) NEGATIVE Final    Comment: (NOTE) SARS-CoV-2 target nucleic acids are DETECTED  SARS-CoV-2 RNA is generally detectable in upper respiratory specimens  during the acute phase of infection.  Positive results are indicative  of the presence of the identified virus, but do not rule out bacterial infection or co-infection with other pathogens not detected by the test.  Clinical correlation with patient history and  other diagnostic information is necessary to determine patient infection status.  The expected result is negative.  Fact Sheet for Patients:   RoadLapTop.co.za   Fact Sheet for Healthcare Providers:   http://kim-miller.com/    This test is not yet approved or cleared by the [place] FDA and  has been authorized for detection and/or diagnosis of SARS-CoV-2 by FDA under an Emergency Use Authorization (EUA).  This EUA will remain in effect (meaning this test can be used)  for the duration of  the COVID-19 declaration under Section 564(b)(1)  of the Act, 21 U.S.C. section 360-bbb-3(b)(1), unless the authorization is terminated or revoked sooner.   Performed at [facility], [address] [place], [place] [address]   Urine Culture     Status: Abnormal   Collection Time: 01/29/22  5:10 PM   Specimen: In/Out Cath Urine  Result Value Ref Range Status   Specimen Description   Final    IN/OUT CATH URINE Performed at [facility], [address], [place], [place] [address]    Special Requests   Final    NONE Performed at [facility], [address] [place], [place] [address]    Culture MULTIPLE SPECIES PRESENT, SUGGEST RECOLLECTION (A)  Final   Report Status 01/30/2022 FINAL  Final     Labs: BNP (last 3 results) No results for input(s): "BNP" in the last 8760 hours. Basic Metabolic Panel: Recent Labs  Lab 01/29/22 1425 01/31/22 0537 02/01/22 0618  NA 142 139 138  K 3.5 3.2* 3.4*  CL 104 103 105  CO2 29 29 27   GLUCOSE 107* 114* 131*  BUN 12 18 16   CREATININE 1.20 1.18 1.02  CALCIUM 9.8 8.2* 8.3*  MG  --  2.0 2.2  PHOS  --  3.8 4.3   Liver Function Tests: Recent Labs  Lab 01/29/22 1425 01/31/22 0537 02/01/22 0618  AST 39 63* 101*  ALT 47* 52* 75*  ALKPHOS 95 72 75  BILITOT 1.8* 1.0 0.8  PROT 8.1 6.4* 6.3*  ALBUMIN 4.8 3.5 3.5   No results for input(s): "LIPASE", "AMYLASE" in the last 168 hours. No results for input(s): "AMMONIA" in the last 168 hours. CBC: Recent Labs  Lab 01/29/22 1425 01/31/22 0537 02/01/22 0618  WBC 8.8 5.2 4.1  NEUTROABS 7.5 3.0 2.4  HGB 15.2 13.1 12.7*  HCT 45.2 37.8* 37.5*  MCV 96.4 96.9 96.4  PLT 161 142* 141*   Cardiac Enzymes: Recent Labs  Lab 01/29/22 1425  CKTOTAL 372   BNP: Invalid input(s): "POCBNP" CBG: No results for input(s): "GLUCAP" in the last 168 hours. D-Dimer Recent Labs    01/31/22 0537 02/01/22 0618  DDIMER  1.55* 1.19*   Hgb A1c No results for input(s): "HGBA1C" in the last 72 hours. Lipid Profile No results for input(s): "CHOL", "HDL", "LDLCALC", "TRIG", "CHOLHDL", "LDLDIRECT" in the last 72 hours. Thyroid function studies No results for input(s): "  TSH", "T4TOTAL", "T3FREE", "THYROIDAB" in the last 72 hours.  Invalid input(s): "FREET3" Anemia work up Recent Labs    01/31/22 0537 02/01/22 0618  FERRITIN 157 235   Urinalysis    Component Value Date/Time   COLORURINE YELLOW 01/29/2022 1710   APPEARANCEUR CLEAR 01/29/2022 1710   LABSPEC 1.014 01/29/2022 1710   PHURINE 6.0 01/29/2022 1710   GLUCOSEU NEGATIVE 01/29/2022 1710   HGBUR NEGATIVE 01/29/2022 1710   BILIRUBINUR NEGATIVE 01/29/2022 1710   BILIRUBINUR neg 05/10/2013 2025   KETONESUR NEGATIVE 01/29/2022 1710   PROTEINUR NEGATIVE 01/29/2022 1710   UROBILINOGEN 0.2 05/10/2013 2025   NITRITE NEGATIVE 01/29/2022 1710   LEUKOCYTESUR NEGATIVE 01/29/2022 1710   Sepsis Labs Recent Labs  Lab 01/29/22 1425 01/31/22 0537 02/01/22 0618  WBC 8.8 5.2 4.1   Microbiology Recent Results (from the past 240 hour(s))  Blood Culture (routine x 2)     Status: None (Preliminary result)   Collection Time: 01/29/22  2:25 PM   Specimen: BLOOD  Result Value Ref Range Status   Specimen Description   Final    BLOOD SITE NOT SPECIFIED Performed at [facility], [address], [place], [place] [address]    Special Requests   Final    BOTTLES DRAWN AEROBIC AND ANAEROBIC Blood Culture adequate volume Performed at [facility], [address], [place], [place] [address]    Culture   Final    NO GROWTH 3 DAYS Performed at [facility], [address], [place], [place] [address]    Report Status PENDING  Incomplete  Blood Culture (routine x 2)     Status: None (Preliminary result)   Collection Time: 01/29/22  2:50 PM   Specimen: BLOOD  Result Value Ref Range Status   Specimen Description    Final    BLOOD SITE NOT SPECIFIED Performed at [facility], [address], [place], [place] [address]    Special Requests   Final    BOTTLES DRAWN AEROBIC AND ANAEROBIC Blood Culture results may not be optimal due to an inadequate volume of blood received in culture bottles Performed at [facility], [address], [place], [place] [address]    Culture   Final    NO GROWTH 3 DAYS Performed at [facility], [address] [place], [place] [address]    Report Status PENDING  Incomplete  SARS Coronavirus 2 by RT PCR (hospital order, performed in [facility] hospital lab) *cepheid single result test* Anterior Nasal Swab     Status: Abnormal   Collection Time: 01/29/22  4:23 PM   Specimen: Anterior Nasal Swab  Result Value Ref Range Status   SARS Coronavirus 2 by RT PCR POSITIVE (A) NEGATIVE Final    Comment: (NOTE) SARS-CoV-2 target nucleic acids are DETECTED  SARS-CoV-2 RNA is generally detectable in upper respiratory specimens  during the acute phase of infection.  Positive results are indicative  of the presence of the identified virus, but do not rule out bacterial infection or co-infection with other pathogens not detected by the test.  Clinical correlation with patient history and  other diagnostic information is necessary to determine patient infection status.  The expected result is negative.  Fact Sheet for Patients:   RoadLapTop.co.za   Fact Sheet for Healthcare Providers:   http://kim-miller.com/    This test is not yet approved or cleared by the [place] FDA and  has been authorized for detection and/or diagnosis of  SARS-CoV-2 by FDA under an Emergency Use Authorization (EUA).  This EUA will remain in effect (meaning this test can be used) for the duration of  the COVID-19 declaration under Section 564(b)(1)  of the Act, 21 U.S.C. section 360-bbb-3(b)(1), unless the  authorization is terminated or revoked sooner.   Performed at [facility], [address], [place], [place] [address]   Urine Culture     Status: Abnormal   Collection Time: 01/29/22  5:10 PM   Specimen: In/Out Cath Urine  Result Value Ref Range Status   Specimen Description   Final    IN/OUT CATH URINE Performed at [facility], [address], [place], [place] [address]    Special Requests   Final    NONE Performed at [facility], [address] [place], [place] [address]    Culture MULTIPLE SPECIES PRESENT, SUGGEST RECOLLECTION (A)  Final   Report Status 01/30/2022 FINAL  Final     Patient was seen and examined on the day of discharge and was found to be in stable condition. Time coordinating discharge: 25 minutes including assessment and coordination of care, as well as examination of the patient.   SIGNED:  Noralee Stain, DO [facility] 02/01/2022, 10:14 AM

## 2022-02-01 NOTE — TOC Transition Note (Signed)
Transition of Care [facility]) - CM/SW Discharge Note   Patient Details  Name: James Gibbs MRN: [id number] Date of Birth: 06/21/1955  Transition of Care [facility]) CM/SW Contact:  Princella Ion, LCSW Phone Number: 02/01/2022, 12:48 PM   Clinical Narrative:   Pt discharging home with [facility]. Family to transport pt home.    Final next level of care: Home w Home Health Services Barriers to Discharge: No Barriers Identified   Patient Goals and CMS Choice Patient states their goals for this hospitalization and ongoing recovery are:: Return home with [facility] PT.   Choice offered to / list presented to : NA  Discharge Placement                       Discharge Plan and Services                          HH Arranged: PT, OT [facility] Agency: [facility] Date [facility] Agency Contacted: 01/31/22      Social Determinants of Health (SDOH) Interventions     Readmission Risk Interventions     No data to display

## 2022-02-03 LAB — CULTURE, BLOOD (ROUTINE X 2)
Culture: NO GROWTH
Culture: NO GROWTH
Special Requests: ADEQUATE

## 2022-07-25 ENCOUNTER — Emergency Department (HOSPITAL_COMMUNITY)
Admission: EM | Admit: 2022-07-25 | Discharge: 2022-07-25 | Disposition: A | Discharge status: Home / Self Care | Payer: No Typology Code available for payment source | Attending: Emergency Medicine | Admitting: Emergency Medicine

## 2022-07-25 ENCOUNTER — Emergency Department (HOSPITAL_COMMUNITY): Payer: No Typology Code available for payment source

## 2022-07-25 ENCOUNTER — Other Ambulatory Visit: Payer: Self-pay

## 2022-07-25 DIAGNOSIS — S7012XA Contusion of left thigh, initial encounter: Secondary | ICD-10-CM | POA: Insufficient documentation

## 2022-07-25 DIAGNOSIS — M25562 Pain in left knee: Secondary | ICD-10-CM | POA: Diagnosis not present

## 2022-07-25 DIAGNOSIS — M25552 Pain in left hip: Secondary | ICD-10-CM | POA: Insufficient documentation

## 2022-07-25 DIAGNOSIS — I10 Essential (primary) hypertension: Secondary | ICD-10-CM | POA: Insufficient documentation

## 2022-07-25 DIAGNOSIS — Z7984 Long term (current) use of oral hypoglycemic drugs: Secondary | ICD-10-CM | POA: Diagnosis not present

## 2022-07-25 DIAGNOSIS — W010XXA Fall on same level from slipping, tripping and stumbling without subsequent striking against object, initial encounter: Secondary | ICD-10-CM | POA: Insufficient documentation

## 2022-07-25 DIAGNOSIS — Z8616 Personal history of COVID-19: Secondary | ICD-10-CM | POA: Diagnosis not present

## 2022-07-25 DIAGNOSIS — Z79899 Other long term (current) drug therapy: Secondary | ICD-10-CM | POA: Diagnosis not present

## 2022-07-25 DIAGNOSIS — S79922A Unspecified injury of left thigh, initial encounter: Secondary | ICD-10-CM | POA: Diagnosis present

## 2022-07-25 MED ORDER — ACETAMINOPHEN 500 MG PO TABS
1000.0000 mg | ORAL_TABLET | Freq: Once | ORAL | Status: AC
  Administered 2022-07-25: 1000 mg via ORAL
  Filled 2022-07-25: qty 2

## 2022-07-25 NOTE — Discharge Instructions (Signed)
We obtained X-rays of your knee and hip. We did not find any fractures. Your symptoms are likely due to a bruise. Please take 1000 mg of tylenol every 6 hours as needed for your pain. You can also apply ice to help with pain/swelling.   Please return if you develop any new symptoms such as headaches, fevers, nausea, or vomiting, worsening pain, or difficulty walking.

## 2022-07-25 NOTE — ED Triage Notes (Signed)
Pt BIBA from home. Pt had mech fall on the 24th, falling on to L side and exp L knee and hip pain. Pt went to clinic and received shots in hips. Pt reports no improvement, and is struggling to ambulate d/t pain.  No blood thinners, and had no LOC, or head trauma from fall.   AOx4  BP: 148/92 HR: 81 SPO2: 98 RA

## 2022-07-25 NOTE — ED Provider Notes (Signed)
[place] AT [facility] Provider Note  CSN: [id number] Arrival date & time: 07/25/22 [id number]  Chief Complaint(s) Fall and Knee Pain  HPI James Gibbs is a 67 y.o. male history of hypertension, hyperlipidemia, multiple sclerosis presented to the emergency department with fall.  Patient reports fall 6 days ago.  He fell on his left side after tripping.  Normally walks with a walker.  He reports since then he has had some left knee pain and left hip pain.  He did not hit his head and has had no headaches, neck or back pain since the fall.  No pain to his upper extremities or his right leg.  He has been ambulatory.  He saw his primary doctor and reports he got 2 "shots", he has his discharge summary with him which shows that he actually just got the flu shot and Prevnar shot.  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    Malignant hypertension    Mitral valve anterior leaflet prolapse    Mixed hyperlipidemia    Patient Active Problem List   Diagnosis Date Noted   COVID-19 virus infection 01/30/2022   Multiple sclerosis ([place]) 01/30/2022   COVID-19 01/30/2022   Acute blood loss anemia    GIB (gastrointestinal bleeding) 12/13/2018   HTN (hypertension) 12/13/2018   Syncope, vasovagal 12/13/2018   Hypotension 12/13/2018   Gout 12/13/2018   HYPERTENSION, UNCONTROLLED 06/04/2009   OBESITY, UNSPECIFIED 08/15/2008   MITRAL REGURGITATION 08/15/2008   MITRAL VALVE PROLAPSE 08/15/2008   Home Medication(s) Prior to Admission medications   Medication Sig Start Date End Date Taking? Authorizing Provider  allopurinol (ZYLOPRIM) 300 MG tablet Take 300 mg by mouth daily.    [provider]  amLODipine (NORVASC) 10 MG tablet Take 10 mg by mouth daily.    [provider]  Ascorbic Acid (VITAMIN C) 100 MG tablet Take 500 mg by  mouth in the morning and at bedtime.    [provider]  atorvastatin (LIPITOR) 80 MG tablet Take 80 mg by mouth at bedtime.    [provider]  carboxymethylcellulose (REFRESH PLUS) 0.5 % SOLN Place 1 drop into both eyes 4 (four) times daily.    [provider]  cholecalciferol (VITAMIN D) 400 UNITS TABS tablet Take 400 Units by mouth daily.    [provider]  dalfampridine 10 MG TB12 Take 1 tablet by mouth every 12 (twelve) hours.    [provider]  Dimethyl Fumarate (TECFIDERA) 240 MG CPDR Take 240 mg by mouth 2 (two) times daily.    [provider]  finasteride (PROSCAR) 5 MG tablet Take 5 mg by mouth daily.    [provider]  Hydrochlorothiazide (MICROZIDE PO) Take 25 mg by mouth daily.    [provider]  lidocaine (LIDODERM) 5 % Place 1 patch onto the skin daily. Remove & Discard patch within 12 hours or as directed by MD    [provider]  lisinopril (PRINIVIL,ZESTRIL) 40 MG tablet Take 40 mg by mouth daily.    [provider]  METFORMIN HCL PO Take 500 mg by mouth daily.     [provider]  Multiple Vitamin (MULTIVITAMIN WITH MINERALS) TABS tablet Take 1 tablet by mouth daily.    [provider]  Omega-3 Fatty Acids (FISH OIL) 1000 MG CAPS Take 1,000 mg by mouth daily.  [provider]  pantoprazole (PROTONIX) 40 MG tablet Take 1 tablet (40 mg total) by mouth daily. Patient taking differently: Take 40 mg by mouth 2 (two) times daily. 12/22/18 04/05/22  Eugenie Filler, MD  polyethylene glycol (MIRALAX / GLYCOLAX) 17 g packet Take 17 g by mouth 2 (two) times daily. Patient taking differently: Take 17 g by mouth daily. 12/22/18   Eugenie Filler, MD  potassium chloride SA (K-DUR) 20 MEQ tablet Take 20 mEq by mouth daily.    [provider]  senna-docusate (SENOKOT-S) 8.6-50 MG tablet Take 1 tablet by mouth 2 (two) times daily. Patient taking differently:  Take 2 tablets by mouth 2 (two) times daily. 12/22/18   Eugenie Filler, MD  tamsulosin (FLOMAX) 0.4 MG CAPS capsule Take 0.8 mg by mouth daily.    [provider]                                                                                                                                    Past Surgical History Past Surgical History:  Procedure Laterality Date   COLONOSCOPY WITH PROPOFOL N/A 12/17/2018   Procedure: COLONOSCOPY WITH PROPOFOL;  Surgeon: Ronald Lobo, MD;  Location: [facility] ENDOSCOPY;  Service: Endoscopy;  Laterality: N/A;   ESOPHAGOGASTRODUODENOSCOPY (EGD) WITH PROPOFOL N/A 12/14/2018   Procedure: ESOPHAGOGASTRODUODENOSCOPY (EGD) MODERATE SEDATION;  Surgeon: Ronald Lobo, MD;  Location: [facility] ENDOSCOPY;  Service: Endoscopy;  Laterality: N/A;   GIVENS CAPSULE STUDY N/A 12/18/2018   Procedure: GIVENS CAPSULE STUDY;  Surgeon: Arta Silence, MD;  Location: [facility] ENDOSCOPY;  Service: Endoscopy;  Laterality: N/A;   none     Family History Family History  Problem Relation Age of Onset   Hypertension Mother    Hypertension Father     Social History Social History   Tobacco Use   Smoking status: Never   Smokeless tobacco: Never  Substance Use Topics   Alcohol use: No   Drug use: No   Allergies Patient has no known allergies.  Review of Systems Review of Systems  All other systems reviewed and are negative.   Physical Exam Vital Signs  I have reviewed the triage vital signs BP (!) 140/88   Pulse 80   Temp 97.7 F (36.5 C)   Resp 16   SpO2 95%  Physical Exam Vitals and nursing note reviewed.  Constitutional:      General: He is not in acute distress.    Appearance: Normal appearance.  HENT:     Head: Normocephalic and atraumatic.     Mouth/Throat:     Mouth: Mucous membranes are moist.  Eyes:     Conjunctiva/sclera: Conjunctivae normal.  Cardiovascular:     Rate and Rhythm: Normal rate and regular rhythm.  Pulmonary:     Effort: Pulmonary  effort is normal. No respiratory distress.     Breath sounds: Normal breath sounds.  Abdominal:     General: Abdomen  is flat.     Palpations: Abdomen is soft.     Tenderness: There is no abdominal tenderness.  Musculoskeletal:     Right lower leg: No edema.     Left lower leg: No edema.     Comments: No midline C, T, L-spine tenderness.  No chest wall tenderness or crepitus.  Full painless range of motion at the bilateral upper extremities including the shoulders, elbows, wrists, hand and fingers, and in the bilateral lower extremities including the hips, knees, ankle, toes.  No focal bony tenderness, injury or deformity.  Skin:    General: Skin is warm and dry.     Capillary Refill: Capillary refill takes less than 2 seconds.     Comments: Healing abrasion to left anterior knee  Neurological:     Mental Status: He is alert and oriented to person, place, and time. Mental status is at baseline.  Psychiatric:        Mood and Affect: Mood normal.        Behavior: Behavior normal.     ED Results and Treatments Labs (all labs ordered are listed, but only abnormal results are displayed) Labs Reviewed - No data to display                                                                                                                        Radiology DG Knee Complete 4 Views Left  Result Date: 07/25/2022 CLINICAL DATA:  Left knee pain after fall last week. EXAM: LEFT KNEE - COMPLETE 4+ VIEW COMPARISON:  None Available. FINDINGS: No evidence of fracture, dislocation, or joint effusion. No evidence of arthropathy. Mild patellar spurring. Soft tissues are unremarkable. IMPRESSION: Negative. Electronically Signed   By: Marijo Conception M.D.   On: 07/25/2022 09:53   DG Hip Unilat With Pelvis 2-3 Views Left  Result Date: 07/25/2022 CLINICAL DATA:  Left hip pain after fall last week. EXAM: DG HIP (WITH OR WITHOUT PELVIS) 2-3V LEFT COMPARISON:  None Available. FINDINGS: There is no evidence of hip  fracture or dislocation. There is no evidence of arthropathy or other focal bone abnormality. IMPRESSION: Negative. Electronically Signed   By: Marijo Conception M.D.   On: 07/25/2022 09:52    Pertinent labs & imaging results that were available during my care of the patient were reviewed by me and considered in my medical decision making (see MDM for details).  Medications Ordered in ED Medications  acetaminophen (TYLENOL) tablet 1,000 mg (1,000 mg Oral Given 07/25/22 1003)  Procedures Procedures  (including critical care time)  Medical Decision Making / ED Course   MDM:  67 year old male presenting to the emergency department with leg pain after fall.  Patient well-appearing, physical exam overall unremarkable.  He does have an abrasion to his left knee but range of motion of the left knee and left hip are intact.  No other signs of trauma.  Will obtain x-rays, treat pain with Tylenol.  Suspect likely contusions.  Will ambulate after x-rays if negative.  Likely discharge back to home.  Clinical Course as of 07/25/22 1025  Fri Jul 25, 2022  1024 Pain improved after tylenol. Will ambulate. Will discharge patient to home. All questions answered. Patient comfortable with plan of discharge. Return precautions discussed with patient and specified on the after visit summary.  [WS]    Clinical Course User Index [WS] Cristie Hem, MD     Additional history obtained: -Additional history obtained from ems -External records from outside source obtained and reviewed including: Chart review including previous notes, labs, imaging, consultation notes including ED visit for weakness 01/29/22   Imaging Studies ordered: I ordered imaging studies including XR left knee and hip On my interpretation imaging demonstrates no fracture I independently visualized and  interpreted imaging. 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    Final Clinical Impression(s) / ED Diagnoses Final diagnoses:  Contusion of left thigh, initial encounter     This chart was dictated using voice recognition software.  Despite best efforts to proofread,  errors can occur which can change the documentation meaning.    Cristie Hem, MD 07/25/22 1025

## 2022-09-03 ENCOUNTER — Emergency Department (HOSPITAL_COMMUNITY): Payer: No Typology Code available for payment source

## 2022-09-03 ENCOUNTER — Encounter (HOSPITAL_COMMUNITY): Payer: Self-pay

## 2022-09-03 ENCOUNTER — Other Ambulatory Visit: Payer: Self-pay

## 2022-09-03 ENCOUNTER — Inpatient Hospital Stay (HOSPITAL_COMMUNITY)
Admission: EM | Admit: 2022-09-03 | Discharge: 2022-09-09 | DRG: 059 | Disposition: A | Discharge status: Rehabilitation Facility | Payer: No Typology Code available for payment source | Attending: Internal Medicine | Admitting: Internal Medicine

## 2022-09-03 DIAGNOSIS — Z79899 Other long term (current) drug therapy: Secondary | ICD-10-CM

## 2022-09-03 DIAGNOSIS — K5900 Constipation, unspecified: Secondary | ICD-10-CM | POA: Diagnosis present

## 2022-09-03 DIAGNOSIS — D696 Thrombocytopenia, unspecified: Secondary | ICD-10-CM | POA: Diagnosis present

## 2022-09-03 DIAGNOSIS — E782 Mixed hyperlipidemia: Secondary | ICD-10-CM | POA: Diagnosis present

## 2022-09-03 DIAGNOSIS — Z7984 Long term (current) use of oral hypoglycemic drugs: Secondary | ICD-10-CM

## 2022-09-03 DIAGNOSIS — M109 Gout, unspecified: Secondary | ICD-10-CM | POA: Diagnosis present

## 2022-09-03 DIAGNOSIS — Z1152 Encounter for screening for COVID-19: Secondary | ICD-10-CM

## 2022-09-03 DIAGNOSIS — R32 Unspecified urinary incontinence: Secondary | ICD-10-CM | POA: Diagnosis present

## 2022-09-03 DIAGNOSIS — I119 Hypertensive heart disease without heart failure: Secondary | ICD-10-CM | POA: Diagnosis present

## 2022-09-03 DIAGNOSIS — R531 Weakness: Secondary | ICD-10-CM | POA: Diagnosis not present

## 2022-09-03 DIAGNOSIS — E119 Type 2 diabetes mellitus without complications: Secondary | ICD-10-CM | POA: Diagnosis present

## 2022-09-03 DIAGNOSIS — K219 Gastro-esophageal reflux disease without esophagitis: Secondary | ICD-10-CM | POA: Diagnosis present

## 2022-09-03 DIAGNOSIS — E669 Obesity, unspecified: Secondary | ICD-10-CM | POA: Diagnosis present

## 2022-09-03 DIAGNOSIS — G8191 Hemiplegia, unspecified affecting right dominant side: Secondary | ICD-10-CM | POA: Diagnosis present

## 2022-09-03 DIAGNOSIS — G35 Multiple sclerosis: Principal | ICD-10-CM | POA: Diagnosis present

## 2022-09-03 DIAGNOSIS — E872 Acidosis, unspecified: Secondary | ICD-10-CM | POA: Diagnosis present

## 2022-09-03 DIAGNOSIS — K449 Diaphragmatic hernia without obstruction or gangrene: Secondary | ICD-10-CM | POA: Diagnosis present

## 2022-09-03 DIAGNOSIS — Z6831 Body mass index (BMI) 31.0-31.9, adult: Secondary | ICD-10-CM

## 2022-09-03 DIAGNOSIS — Z8249 Family history of ischemic heart disease and other diseases of the circulatory system: Secondary | ICD-10-CM

## 2022-09-03 DIAGNOSIS — N400 Enlarged prostate without lower urinary tract symptoms: Secondary | ICD-10-CM | POA: Diagnosis present

## 2022-09-03 DIAGNOSIS — Z87891 Personal history of nicotine dependence: Secondary | ICD-10-CM

## 2022-09-03 DIAGNOSIS — Z91199 Patient's noncompliance with other medical treatment and regimen due to unspecified reason: Secondary | ICD-10-CM

## 2022-09-03 DIAGNOSIS — E876 Hypokalemia: Secondary | ICD-10-CM | POA: Diagnosis not present

## 2022-09-03 DIAGNOSIS — B9789 Other viral agents as the cause of diseases classified elsewhere: Secondary | ICD-10-CM | POA: Diagnosis present

## 2022-09-03 DIAGNOSIS — A419 Sepsis, unspecified organism: Secondary | ICD-10-CM

## 2022-09-03 DIAGNOSIS — G4733 Obstructive sleep apnea (adult) (pediatric): Secondary | ICD-10-CM | POA: Diagnosis present

## 2022-09-03 LAB — CBC WITH DIFFERENTIAL/PLATELET
Abs Immature Granulocytes: 0.02 10*3/uL (ref 0.00–0.07)
Basophils Absolute: 0 10*3/uL (ref 0.0–0.1)
Basophils Relative: 0 %
Eosinophils Absolute: 0.1 10*3/uL (ref 0.0–0.5)
Eosinophils Relative: 2 %
HCT: 42.4 % (ref 39.0–52.0)
Hemoglobin: 14.4 g/dL (ref 13.0–17.0)
Immature Granulocytes: 0 %
Lymphocytes Relative: 6 %
Lymphs Abs: 0.4 10*3/uL — ABNORMAL LOW (ref 0.7–4.0)
MCH: 32.5 pg (ref 26.0–34.0)
MCHC: 34 g/dL (ref 30.0–36.0)
MCV: 95.7 fL (ref 80.0–100.0)
Monocytes Absolute: 0.5 10*3/uL (ref 0.1–1.0)
Monocytes Relative: 7 %
Neutro Abs: 5.8 10*3/uL (ref 1.7–7.7)
Neutrophils Relative %: 85 %
Platelets: 155 10*3/uL (ref 150–400)
RBC: 4.43 MIL/uL (ref 4.22–5.81)
RDW: 13.8 % (ref 11.5–15.5)
WBC: 6.8 10*3/uL (ref 4.0–10.5)
nRBC: 0 % (ref 0.0–0.2)

## 2022-09-03 LAB — APTT: aPTT: 30 seconds (ref 24–36)

## 2022-09-03 LAB — PROTIME-INR
INR: 1 (ref 0.8–1.2)
Prothrombin Time: 13.4 seconds (ref 11.4–15.2)

## 2022-09-03 MED ORDER — LACTATED RINGERS IV SOLN: INTRAVENOUS | Status: AC

## 2022-09-03 MED ORDER — SODIUM CHLORIDE 0.9 % IV BOLUS (SEPSIS)
1000.0000 mL | Freq: Once | INTRAVENOUS | Status: AC
  Administered 2022-09-03: 1000 mL via INTRAVENOUS

## 2022-09-03 MED ORDER — VANCOMYCIN HCL IN DEXTROSE 1-5 GM/200ML-% IV SOLN
1000.0000 mg | Freq: Once | INTRAVENOUS | Status: AC
  Administered 2022-09-04: 1000 mg via INTRAVENOUS
  Filled 2022-09-03: qty 200

## 2022-09-03 MED ORDER — SODIUM CHLORIDE 0.9 % IV BOLUS (SEPSIS)
1000.0000 mL | Freq: Once | INTRAVENOUS | Status: AC
  Administered 2022-09-04: 1000 mL via INTRAVENOUS

## 2022-09-03 MED ORDER — METRONIDAZOLE 500 MG/100ML IV SOLN
500.0000 mg | Freq: Once | INTRAVENOUS | Status: AC
  Administered 2022-09-04: 500 mg via INTRAVENOUS
  Filled 2022-09-03: qty 100

## 2022-09-03 MED ORDER — SODIUM CHLORIDE 0.9 % IV SOLN
2.0000 g | Freq: Once | INTRAVENOUS | Status: AC
  Administered 2022-09-03: 2 g via INTRAVENOUS
  Filled 2022-09-03: qty 12.5

## 2022-09-03 MED ORDER — ACETAMINOPHEN 325 MG PO TABS
650.0000 mg | ORAL_TABLET | Freq: Once | ORAL | Status: AC
  Administered 2022-09-04: 650 mg via ORAL
  Filled 2022-09-03: qty 2

## 2022-09-03 NOTE — Progress Notes (Signed)
A consult was received from an ED physician for Vancomycin and Cefepime per pharmacy dosing.  The patient's profile has been reviewed for ht/wt/allergies/indication/available labs.    Labs pending  A one time initial order has been placed for Vancomycin 1gm and Cefepime 2gm IV.    Further antibiotics/pharmacy consults should be ordered by admitting physician if indicated.                       Thank you, Maryellen Pile, PharmD 09/03/2022  11:09 PM

## 2022-09-03 NOTE — ED Triage Notes (Signed)
Pt BIB EMS from home for more weakness/lethargy per usual. Per family, pt "is usually active but now can't get up on his own

## 2022-09-04 ENCOUNTER — Emergency Department (HOSPITAL_COMMUNITY): Payer: No Typology Code available for payment source

## 2022-09-04 ENCOUNTER — Inpatient Hospital Stay (HOSPITAL_COMMUNITY): Payer: No Typology Code available for payment source

## 2022-09-04 DIAGNOSIS — Z794 Long term (current) use of insulin: Secondary | ICD-10-CM | POA: Diagnosis not present

## 2022-09-04 DIAGNOSIS — I10 Essential (primary) hypertension: Secondary | ICD-10-CM | POA: Diagnosis not present

## 2022-09-04 DIAGNOSIS — G8191 Hemiplegia, unspecified affecting right dominant side: Secondary | ICD-10-CM | POA: Diagnosis present

## 2022-09-04 DIAGNOSIS — R509 Fever, unspecified: Secondary | ICD-10-CM | POA: Diagnosis not present

## 2022-09-04 DIAGNOSIS — B348 Other viral infections of unspecified site: Secondary | ICD-10-CM | POA: Diagnosis not present

## 2022-09-04 DIAGNOSIS — M109 Gout, unspecified: Secondary | ICD-10-CM | POA: Diagnosis present

## 2022-09-04 DIAGNOSIS — N400 Enlarged prostate without lower urinary tract symptoms: Secondary | ICD-10-CM | POA: Diagnosis present

## 2022-09-04 DIAGNOSIS — B9789 Other viral agents as the cause of diseases classified elsewhere: Secondary | ICD-10-CM | POA: Diagnosis present

## 2022-09-04 DIAGNOSIS — R531 Weakness: Secondary | ICD-10-CM

## 2022-09-04 DIAGNOSIS — G4733 Obstructive sleep apnea (adult) (pediatric): Secondary | ICD-10-CM | POA: Diagnosis present

## 2022-09-04 DIAGNOSIS — Z79899 Other long term (current) drug therapy: Secondary | ICD-10-CM | POA: Diagnosis not present

## 2022-09-04 DIAGNOSIS — E118 Type 2 diabetes mellitus with unspecified complications: Secondary | ICD-10-CM | POA: Diagnosis not present

## 2022-09-04 DIAGNOSIS — Z7984 Long term (current) use of oral hypoglycemic drugs: Secondary | ICD-10-CM | POA: Diagnosis not present

## 2022-09-04 DIAGNOSIS — E876 Hypokalemia: Secondary | ICD-10-CM | POA: Diagnosis not present

## 2022-09-04 DIAGNOSIS — K219 Gastro-esophageal reflux disease without esophagitis: Secondary | ICD-10-CM | POA: Diagnosis present

## 2022-09-04 DIAGNOSIS — K449 Diaphragmatic hernia without obstruction or gangrene: Secondary | ICD-10-CM | POA: Diagnosis present

## 2022-09-04 DIAGNOSIS — G35 Multiple sclerosis: Secondary | ICD-10-CM | POA: Diagnosis present

## 2022-09-04 DIAGNOSIS — K5900 Constipation, unspecified: Secondary | ICD-10-CM | POA: Diagnosis present

## 2022-09-04 DIAGNOSIS — Z91199 Patient's noncompliance with other medical treatment and regimen due to unspecified reason: Secondary | ICD-10-CM | POA: Diagnosis not present

## 2022-09-04 DIAGNOSIS — Z8249 Family history of ischemic heart disease and other diseases of the circulatory system: Secondary | ICD-10-CM | POA: Diagnosis not present

## 2022-09-04 DIAGNOSIS — R32 Unspecified urinary incontinence: Secondary | ICD-10-CM | POA: Diagnosis present

## 2022-09-04 DIAGNOSIS — E872 Acidosis, unspecified: Secondary | ICD-10-CM | POA: Diagnosis present

## 2022-09-04 DIAGNOSIS — E669 Obesity, unspecified: Secondary | ICD-10-CM | POA: Diagnosis present

## 2022-09-04 DIAGNOSIS — I119 Hypertensive heart disease without heart failure: Secondary | ICD-10-CM | POA: Diagnosis present

## 2022-09-04 DIAGNOSIS — Z1152 Encounter for screening for COVID-19: Secondary | ICD-10-CM | POA: Diagnosis not present

## 2022-09-04 DIAGNOSIS — E119 Type 2 diabetes mellitus without complications: Secondary | ICD-10-CM | POA: Diagnosis present

## 2022-09-04 DIAGNOSIS — Z87891 Personal history of nicotine dependence: Secondary | ICD-10-CM | POA: Diagnosis not present

## 2022-09-04 DIAGNOSIS — Z6831 Body mass index (BMI) 31.0-31.9, adult: Secondary | ICD-10-CM | POA: Diagnosis not present

## 2022-09-04 DIAGNOSIS — D696 Thrombocytopenia, unspecified: Secondary | ICD-10-CM | POA: Diagnosis present

## 2022-09-04 DIAGNOSIS — M62838 Other muscle spasm: Secondary | ICD-10-CM | POA: Diagnosis not present

## 2022-09-04 DIAGNOSIS — R058 Other specified cough: Secondary | ICD-10-CM | POA: Diagnosis not present

## 2022-09-04 DIAGNOSIS — E782 Mixed hyperlipidemia: Secondary | ICD-10-CM | POA: Diagnosis present

## 2022-09-04 LAB — COMPREHENSIVE METABOLIC PANEL
ALT: 65 U/L — ABNORMAL HIGH (ref 0–44)
ALT: 58 U/L — ABNORMAL HIGH (ref 0–44)
AST: 42 U/L — ABNORMAL HIGH (ref 15–41)
AST: 42 U/L — ABNORMAL HIGH (ref 15–41)
Albumin: 4.4 g/dL (ref 3.5–5.0)
Albumin: 3.6 g/dL (ref 3.5–5.0)
Alkaline Phosphatase: 105 U/L (ref 38–126)
Alkaline Phosphatase: 86 U/L (ref 38–126)
Anion gap: 7 (ref 5–15)
Anion gap: 8 (ref 5–15)
BUN: 11 mg/dL (ref 8–23)
BUN: 9 mg/dL (ref 8–23)
CO2: 29 mmol/L (ref 22–32)
CO2: 23 mmol/L (ref 22–32)
Calcium: 8.9 mg/dL (ref 8.9–10.3)
Calcium: 8.1 mg/dL — ABNORMAL LOW (ref 8.9–10.3)
Chloride: 104 mmol/L (ref 98–111)
Chloride: 108 mmol/L (ref 98–111)
Creatinine, Ser: 1.1 mg/dL (ref 0.61–1.24)
Creatinine, Ser: 0.95 mg/dL (ref 0.61–1.24)
GFR, Estimated: >60 mL/min (ref >60)
GFR, Estimated: >60 mL/min (ref >60)
Glucose, Bld: 105 mg/dL — ABNORMAL HIGH (ref 70–99)
Glucose, Bld: 122 mg/dL — ABNORMAL HIGH (ref 70–99)
Potassium: 3.6 mmol/L (ref 3.5–5.1)
Potassium: 3.3 mmol/L — ABNORMAL LOW (ref 3.5–5.1)
Sodium: 140 mmol/L (ref 135–145)
Sodium: 139 mmol/L (ref 135–145)
Total Bilirubin: 1.2 mg/dL (ref 0.3–1.2)
Total Bilirubin: 0.6 mg/dL (ref 0.3–1.2)
Total Protein: 7.4 g/dL (ref 6.5–8.1)
Total Protein: 6.2 g/dL — ABNORMAL LOW (ref 6.5–8.1)

## 2022-09-04 LAB — CBC WITH DIFFERENTIAL/PLATELET
Abs Immature Granulocytes: 0.01 10*3/uL (ref 0.00–0.07)
Basophils Absolute: 0 10*3/uL (ref 0.0–0.1)
Basophils Relative: 1 %
Eosinophils Absolute: 0.1 10*3/uL (ref 0.0–0.5)
Eosinophils Relative: 1 %
HCT: 39.6 % (ref 39.0–52.0)
Hemoglobin: 13 g/dL (ref 13.0–17.0)
Immature Granulocytes: 0 %
Lymphocytes Relative: 11 %
Lymphs Abs: 0.7 10*3/uL (ref 0.7–4.0)
MCH: 32.1 pg (ref 26.0–34.0)
MCHC: 32.8 g/dL (ref 30.0–36.0)
MCV: 97.8 fL (ref 80.0–100.0)
Monocytes Absolute: 0.8 10*3/uL (ref 0.1–1.0)
Monocytes Relative: 12 %
Neutro Abs: 4.9 10*3/uL (ref 1.7–7.7)
Neutrophils Relative %: 75 %
Platelets: 151 10*3/uL (ref 150–400)
RBC: 4.05 MIL/uL — ABNORMAL LOW (ref 4.22–5.81)
RDW: 14.2 % (ref 11.5–15.5)
WBC: 6.4 10*3/uL (ref 4.0–10.5)
nRBC: 0 % (ref 0.0–0.2)

## 2022-09-04 LAB — URINALYSIS, W/ REFLEX TO CULTURE (INFECTION SUSPECTED)
Bacteria, UA: NONE SEEN
Bilirubin Urine: NEGATIVE
Glucose, UA: NEGATIVE mg/dL
Hgb urine dipstick: NEGATIVE
Ketones, ur: 5 mg/dL — AB
Leukocytes,Ua: NEGATIVE
Nitrite: NEGATIVE
Protein, ur: NEGATIVE mg/dL
Specific Gravity, Urine: 1.008 (ref 1.005–1.030)
pH: 6 (ref 5.0–8.0)

## 2022-09-04 LAB — RESP PANEL BY RT-PCR (RSV, FLU A&B, COVID)  RVPGX2
Influenza A by PCR: NEGATIVE
Influenza B by PCR: NEGATIVE
Resp Syncytial Virus by PCR: NEGATIVE
SARS Coronavirus 2 by RT PCR: NEGATIVE

## 2022-09-04 LAB — LACTIC ACID, PLASMA
Lactic Acid, Venous: 1.3 mmol/L (ref 0.5–1.9)
Lactic Acid, Venous: 4.1 mmol/L (ref 0.5–1.9)
Lactic Acid, Venous: 0.8 mmol/L (ref 0.5–1.9)
Lactic Acid, Venous: 1.8 mmol/L (ref 0.5–1.9)

## 2022-09-04 LAB — MRSA NEXT GEN BY PCR, NASAL: MRSA by PCR Next Gen: NOT DETECTED

## 2022-09-04 LAB — PHOSPHORUS: Phosphorus: 3.2 mg/dL (ref 2.5–4.6)

## 2022-09-04 LAB — MAGNESIUM: Magnesium: 1.9 mg/dL (ref 1.7–2.4)

## 2022-09-04 MED ORDER — METRONIDAZOLE 500 MG/100ML IV SOLN
500.0000 mg | Freq: Two times a day (BID) | INTRAVENOUS | Status: DC
  Administered 2022-09-04 (×2): 500 mg via INTRAVENOUS
  Filled 2022-09-04 (×2): qty 100

## 2022-09-04 MED ORDER — VITAMIN C 500 MG PO TABS
500.0000 mg | ORAL_TABLET | Freq: Every day | ORAL | Status: DC
  Administered 2022-09-04 – 2022-09-09 (×6): 500 mg via ORAL
  Filled 2022-09-04 (×6): qty 1

## 2022-09-04 MED ORDER — VANCOMYCIN HCL IN DEXTROSE 1-5 GM/200ML-% IV SOLN: 1000.0000 mg | INTRAVENOUS | Status: DC

## 2022-09-04 MED ORDER — MELATONIN 5 MG PO TABS
5.0000 mg | ORAL_TABLET | Freq: Every evening | ORAL | Status: DC | PRN
  Administered 2022-09-05 – 2022-09-06 (×2): 5 mg via ORAL
  Filled 2022-09-04 (×2): qty 1

## 2022-09-04 MED ORDER — PROCHLORPERAZINE EDISYLATE 10 MG/2ML IJ SOLN: 5.0000 mg | Freq: Four times a day (QID) | INTRAMUSCULAR | Status: DC | PRN

## 2022-09-04 MED ORDER — POTASSIUM CHLORIDE CRYS ER 20 MEQ PO TBCR
40.0000 meq | EXTENDED_RELEASE_TABLET | Freq: Once | ORAL | Status: AC
  Administered 2022-09-04: 40 meq via ORAL
  Filled 2022-09-04: qty 2

## 2022-09-04 MED ORDER — POLYETHYLENE GLYCOL 3350 17 G PO PACK
17.0000 g | PACK | Freq: Every day | ORAL | Status: DC | PRN
  Administered 2022-09-05 – 2022-09-07 (×4): 17 g via ORAL
  Filled 2022-09-04 (×4): qty 1

## 2022-09-04 MED ORDER — AMLODIPINE BESYLATE 10 MG PO TABS
10.0000 mg | ORAL_TABLET | Freq: Every day | ORAL | Status: DC
  Administered 2022-09-04 – 2022-09-09 (×6): 10 mg via ORAL
  Filled 2022-09-04 (×7): qty 1

## 2022-09-04 MED ORDER — SODIUM CHLORIDE 0.9 % IV SOLN: 2.0000 g | INTRAVENOUS | Status: DC

## 2022-09-04 MED ORDER — CHOLECALCIFEROL 10 MCG (400 UNIT) PO TABS
400.0000 [IU] | ORAL_TABLET | Freq: Every day | ORAL | Status: DC
  Administered 2022-09-04 – 2022-09-09 (×6): 400 [IU] via ORAL
  Filled 2022-09-04 (×6): qty 1

## 2022-09-04 MED ORDER — CARBOXYMETHYLCELLULOSE SODIUM 0.5 % OP SOLN: 1.0000 [drp] | Freq: Four times a day (QID) | OPHTHALMIC | Status: DC

## 2022-09-04 MED ORDER — ATORVASTATIN CALCIUM 40 MG PO TABS
80.0000 mg | ORAL_TABLET | Freq: Every day | ORAL | Status: DC
  Administered 2022-09-04 – 2022-09-08 (×5): 80 mg via ORAL
  Filled 2022-09-04 (×5): qty 2

## 2022-09-04 MED ORDER — PHENOL 1.4 % MT LIQD: 1.0000 | OROMUCOSAL | Status: DC | PRN

## 2022-09-04 MED ORDER — PANTOPRAZOLE SODIUM 40 MG PO TBEC
40.0000 mg | DELAYED_RELEASE_TABLET | Freq: Every day | ORAL | Status: DC
  Administered 2022-09-04 – 2022-09-09 (×6): 40 mg via ORAL
  Filled 2022-09-04 (×6): qty 1

## 2022-09-04 MED ORDER — VANCOMYCIN HCL 750 MG/150ML IV SOLN
750.0000 mg | Freq: Two times a day (BID) | INTRAVENOUS | Status: DC
  Administered 2022-09-04 – 2022-09-05 (×2): 750 mg via INTRAVENOUS
  Filled 2022-09-04 (×2): qty 150

## 2022-09-04 MED ORDER — ALLOPURINOL 300 MG PO TABS
300.0000 mg | ORAL_TABLET | Freq: Every day | ORAL | Status: DC
  Administered 2022-09-04 – 2022-09-09 (×6): 300 mg via ORAL
  Filled 2022-09-04 (×6): qty 1

## 2022-09-04 MED ORDER — FINASTERIDE 5 MG PO TABS
5.0000 mg | ORAL_TABLET | Freq: Every day | ORAL | Status: DC
  Administered 2022-09-04 – 2022-09-09 (×6): 5 mg via ORAL
  Filled 2022-09-04 (×6): qty 1

## 2022-09-04 MED ORDER — ENOXAPARIN SODIUM 60 MG/0.6ML IJ SOSY
50.0000 mg | PREFILLED_SYRINGE | INTRAMUSCULAR | Status: DC
  Filled 2022-09-04 (×3): qty 0.6

## 2022-09-04 MED ORDER — VANCOMYCIN HCL IN DEXTROSE 1-5 GM/200ML-% IV SOLN
1000.0000 mg | INTRAVENOUS | Status: AC
  Administered 2022-09-04: 1000 mg via INTRAVENOUS
  Filled 2022-09-04: qty 200

## 2022-09-04 MED ORDER — TAMSULOSIN HCL 0.4 MG PO CAPS
0.8000 mg | ORAL_CAPSULE | Freq: Every day | ORAL | Status: DC
  Administered 2022-09-04 – 2022-09-09 (×6): 0.8 mg via ORAL
  Filled 2022-09-04 (×6): qty 2

## 2022-09-04 MED ORDER — DALFAMPRIDINE ER 10 MG PO TB12
10.0000 mg | ORAL_TABLET | Freq: Two times a day (BID) | ORAL | Status: DC
  Administered 2022-09-05 – 2022-09-08 (×8): 10 mg via ORAL

## 2022-09-04 MED ORDER — ACETAMINOPHEN 325 MG PO TABS
650.0000 mg | ORAL_TABLET | Freq: Four times a day (QID) | ORAL | Status: DC | PRN
  Administered 2022-09-04: 650 mg via ORAL
  Filled 2022-09-04: qty 2

## 2022-09-04 MED ORDER — GADOBUTROL 1 MMOL/ML IV SOLN
10.0000 mL | Freq: Once | INTRAVENOUS | Status: AC | PRN
  Administered 2022-09-04: 10 mL via INTRAVENOUS

## 2022-09-04 MED ORDER — SODIUM CHLORIDE 0.9 % IV SOLN
2.0000 g | Freq: Three times a day (TID) | INTRAVENOUS | Status: DC
  Administered 2022-09-04 – 2022-09-05 (×4): 2 g via INTRAVENOUS
  Filled 2022-09-04 (×5): qty 12.5

## 2022-09-04 MED ORDER — POLYVINYL ALCOHOL 1.4 % OP SOLN
1.0000 [drp] | Freq: Four times a day (QID) | OPHTHALMIC | Status: DC
  Administered 2022-09-04 – 2022-09-09 (×14): 1 [drp] via OPHTHALMIC
  Filled 2022-09-04: qty 15

## 2022-09-04 NOTE — Plan of Care (Signed)
  Problem: Education: Goal: Knowledge of General Education information will improve Description Including pain rating scale, medication(s)/side effects and non-pharmacologic comfort measures Outcome: Progressing   Problem: Health Behavior/Discharge Planning: Goal: Ability to manage health-related needs will improve Outcome: Progressing   

## 2022-09-04 NOTE — Evaluation (Signed)
Physical Therapy Evaluation Patient Details Name: James Gibbs MRN: [id number] DOB: [birth year] Today's Date: 09/04/2022  History of Present Illness  Pt is a 67 y/o male presenting w/ progressive L LE weakness and sepsis. CT negative, pending MRI. PMH: MS with R LE weakness, HTN, HLD, DM2, BPH, GERD, gout.    Clinical Impression  James Gibbs is 67 y.o. male admitted with above HPI and diagnosis. Patient is currently limited by functional impairments below (see PT problem list). Patient lives alone and is mod independent with upright RW at baseline. Patient was able to sit up to EOB with min assist and intermittent assist to maintain balance sitting EOB due to Rt lateral lean. Pt was able to stand to RW with +2 mod assist and stand to [facility] with +2 min-mod assist. Patient will benefit from continued skilled PT interventions to address impairments and progress independence with mobility, recommending intense follow up rehab in post acute setting. Acute PT will follow and progress as able.        Recommendations for follow up therapy are one component of a multi-disciplinary discharge planning process, led by the attending physician.  Recommendations may be updated based on patient status, additional functional criteria and insurance authorization.  Follow Up Recommendations       Assistance Recommended at Discharge Frequent or constant Supervision/Assistance  Patient can return home with the following  A lot of help with walking and/or transfers;A lot of help with bathing/dressing/bathroom;Assistance with cooking/housework;Assist for transportation;Help with stairs or ramp for entrance    Equipment Recommendations None recommended by PT  Recommendations for Other Services  Rehab consult    Functional Status Assessment Patient has had a recent decline in their functional status and demonstrates the ability to make significant improvements in function in a reasonable and predictable  amount of time.     Precautions / Restrictions Precautions Precautions: Fall;Other (comment) Precaution Comments: chronic R sided weakness Restrictions Weight Bearing Restrictions: No      Mobility  Bed Mobility Overal bed mobility: Needs Assistance Bed Mobility: Supine to Sit     Supine to sit: Mod assist, +2 for physical assistance, +2 for safety/equipment, HOB elevated     General bed mobility comments: assist for LE off of bed and lifting trunk    Transfers Overall transfer level: Needs assistance Equipment used: Rolling walker (2 wheels), Ambulation equipment used Transfers: Sit to/from Stand, Bed to chair/wheelchair/BSC Sit to Stand: Mod assist, +2 physical assistance, +2 safety/equipment           General transfer comment: Stood x 3 with RW with Mod A x2 and difficulty tuckinig in bottom for upright posture. Trialed Stedy with lighter Mod A x 2 to stand and transfer to chair. light Min A to stand from [employer] via Lift Equipment: Stedy  Ambulation/Gait                  Stairs            Wheelchair Mobility    Modified Rankin (Stroke Patients Only)       Balance Overall balance assessment: Needs assistance Sitting-balance support: No upper extremity supported, Feet supported Sitting balance-Leahy Scale: Fair Sitting balance - Comments: R lateral drift w/ dynamic tasks Postural control: Right lateral lean Standing balance support: Bilateral upper extremity supported, During functional activity Standing balance-Leahy Scale: Poor  Pertinent Vitals/Pain      Home Living Family/patient expects to be discharged to:: Private residence Living Arrangements: Alone Available Help at Discharge: Family Type of Home: House Home Access: Stairs to enter Entrance Stairs-Rails: Can reach both Entrance Stairs-Number of Steps: 3   Home Layout: One level Home Equipment: Rollator (4  wheels);BSC/3in1;Shower seat;Hand held shower head;Grab bars - tub/shower;Other (comment) (upright walker)      Prior Function Prior Level of Function : Independent/Modified Independent             Mobility Comments: mobilizes with rollator in the home, uses upright walker in the community ADLs Comments: uses meals on wheels for L/D every day M-F he has cereal in morning and manages as needed on the weekends. reports managing ADLs w/o assist; has medical alert necklace (has access to when showering as well)     Hand Dominance   Dominant Hand: Right    Extremity/Trunk Assessment   Upper Extremity Assessment RUE Deficits / Details: baseline R UE weakness though functional         Cervical / Trunk Assessment Cervical / Trunk Assessment: Normal  Communication   Communication: No difficulties  Cognition Arousal/Alertness: Awake/alert Behavior During Therapy: WFL for tasks assessed/performed Overall Cognitive Status: No family/caregiver present to determine baseline cognitive functioning                                 General Comments: slower processing, pleasant, follows directions w/ increased time.        General Comments      Exercises     Assessment/Plan    PT Assessment Patient needs continued PT services  PT Problem List Decreased strength;Decreased activity tolerance;Decreased balance;Decreased mobility;Decreased knowledge of use of DME;Decreased safety awareness;Decreased knowledge of precautions;Cardiopulmonary status limiting activity;Obesity;Decreased coordination       PT Treatment Interventions DME instruction;Balance training;Therapeutic exercise;Therapeutic activities;Functional mobility training;Stair training;Gait training;Patient/family education;Neuromuscular re-education    PT Goals (Current goals can be found in the Care Plan section)  Acute Rehab PT Goals Patient Stated Goal: regain strength and mobility PT Goal Formulation:  With patient Time For Goal Achievement: 09/18/22 Potential to Achieve Goals: Good    Frequency Min 1X/week     Co-evaluation   Reason for Co-Treatment: To address functional/ADL transfers;For patient/therapist safety   OT goals addressed during session: ADL's and self-care;Proper use of Adaptive equipment and DME;Strengthening/ROM       AM-PAC PT "6 Clicks" Mobility  Outcome Measure Help needed turning from your back to your side while in a flat bed without using bedrails?: A Little Help needed moving from lying on your back to sitting on the side of a flat bed without using bedrails?: A Lot Help needed moving to and from a bed to a chair (including a wheelchair)?: A Lot Help needed standing up from a chair using your arms (e.g., wheelchair or bedside chair)?: A Lot Help needed to walk in hospital room?: Total Help needed climbing 3-5 steps with a railing? : Total 6 Click Score: 11    End of Session Equipment Utilized During Treatment: Gait belt Activity Tolerance: Patient tolerated treatment well Patient left: in chair;with call bell/phone within reach;with chair alarm set;with nursing/sitter in room Nurse Communication: Mobility status PT Visit Diagnosis: Muscle weakness (generalized) (M62.81);Difficulty in walking, not elsewhere classified (R26.2);Unsteadiness on feet (R26.81);Other abnormalities of gait and mobility (R26.89)    Time: [id number] PT Time Calculation (min) (ACUTE ONLY): 28 min  Charges:   PT Evaluation $PT Eval Moderate Complexity: 1 Mod          Wynn Maudlin, DPT Acute Rehabilitation Services Office [phone]  09/04/22 4:58 PM

## 2022-09-04 NOTE — H&P (Addendum)
History and Physical  James LittenWilliam A Gibbs ZOX:[id number] DOB: [birth year] DOA: 09/03/2022  Referring physician: Dr. Blinda LeatherwoodPollina, EDP  PCP: Greta Doomapp, Sheila E, MD  Outpatient Specialists: Neurology Patient coming from: Home  Chief Complaint: Generalized weakness   HPI: James Gibbs is a 67 y.o. male with medical history significant for MS with right lower extremity weakness at baseline, hypertension, hyperlipidemia, type 2 diabetes, BPH, GERD, gout, who presented to [facility] ED from home due to sudden onset generalized weakness.  History is obtained from the patient in the ED.  The patient was in his usual state of health, this morning [facility] to have dental [facility].  He was feeling well afterwards and returned home.  His daughter brought him a meal from [employer] which he ate then took a nap around 6:30 PM.  When he woke up around 7 PM he could not move his left leg.  States his right foot is always weak.  He could not lift his body off the bed.  States he had no strength and could not walk.  EMS was activated and the patient was brought into the ED for further evaluation.  In the ED, febrile with Tmax 102.2, tachycardic with heart rate of 101, tachypneic with respiration rate of 29.  Code sepsis was called in the ED.  Blood cultures were obtained.  The patient was started on broad-spectrum IV antibiotics and IV fluid per sepsis protocol.  COVID-19 screening test, RSV, influenza A&B were negative. UA, non contrast CT head, and chest x-ray were nonacute.  TRH, hospitalist service, was asked to admit.  ED Course: Tmax 102.2.  BP 145/86, pulse 97, respiration rate 24.  Lab studies remarkable for lymphocyte count 0.4.  AST 42, ALT 65.  Review of Systems: Review of systems as noted in the HPI. All other systems reviewed and are negative.   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    Malignant hypertension    Mitral valve  anterior leaflet prolapse    Mixed hyperlipidemia    Past Surgical History:  Procedure Laterality Date   COLONOSCOPY WITH PROPOFOL N/A 12/17/2018   Procedure: COLONOSCOPY WITH PROPOFOL;  Surgeon: Bernette RedbirdBuccini, Robert, MD;  Location: [facility] ENDOSCOPY;  Service: Endoscopy;  Laterality: N/A;   ESOPHAGOGASTRODUODENOSCOPY (EGD) WITH PROPOFOL N/A 12/14/2018   Procedure: ESOPHAGOGASTRODUODENOSCOPY (EGD) MODERATE SEDATION;  Surgeon: Bernette RedbirdBuccini, Robert, MD;  Location: [facility] ENDOSCOPY;  Service: Endoscopy;  Laterality: N/A;   GIVENS CAPSULE STUDY N/A 12/18/2018   Procedure: GIVENS CAPSULE STUDY;  Surgeon: Willis Modenautlaw, Emanuell, MD;  Location: [facility] ENDOSCOPY;  Service: Endoscopy;  Laterality: N/A;   none      Social History:  reports that he has never smoked. He has never used smokeless tobacco. He reports that he does not drink alcohol and does not use drugs.   No Known Allergies  Family History  Problem Relation Age of Onset   Hypertension Mother    Hypertension Father       Prior to Admission medications   Medication Sig Start Date End Date Taking? Authorizing Provider  allopurinol (ZYLOPRIM) 300 MG tablet Take 300 mg by mouth daily.    [provider]  amLODipine (NORVASC) 10 MG tablet Take 10 mg by mouth daily.    [provider]  Ascorbic Acid (VITAMIN C) 100 MG tablet Take 500 mg by mouth in the morning and at bedtime.    [provider]  atorvastatin (LIPITOR) 80 MG tablet  Take 80 mg by mouth at bedtime.    [provider]  carboxymethylcellulose (REFRESH PLUS) 0.5 % SOLN Place 1 drop into both eyes 4 (four) times daily.    [provider]  cholecalciferol (VITAMIN D) 400 UNITS TABS tablet Take 400 Units by mouth daily.    [provider]  dalfampridine 10 MG TB12 Take 1 tablet by mouth every 12 (twelve) hours.    [provider]  Dimethyl Fumarate (TECFIDERA) 240 MG CPDR Take 240 mg by mouth 2 (two) times daily.    [provider]   finasteride (PROSCAR) 5 MG tablet Take 5 mg by mouth daily.    [provider]  Hydrochlorothiazide (MICROZIDE PO) Take 25 mg by mouth daily.    [provider]  lidocaine (LIDODERM) 5 % Place 1 patch onto the skin daily. Remove & Discard patch within 12 hours or as directed by MD    [provider]  lisinopril (PRINIVIL,ZESTRIL) 40 MG tablet Take 40 mg by mouth daily.    [provider]  METFORMIN HCL PO Take 500 mg by mouth daily.     [provider]  Multiple Vitamin (MULTIVITAMIN WITH MINERALS) TABS tablet Take 1 tablet by mouth daily.    [provider]  Omega-3 Fatty Acids (FISH OIL) 1000 MG CAPS Take 1,000 mg by mouth daily.    [provider]  pantoprazole (PROTONIX) 40 MG tablet Take 1 tablet (40 mg total) by mouth daily. Patient taking differently: Take 40 mg by mouth 2 (two) times daily. 12/22/18 04/05/22  Rodolph Bong, MD  polyethylene glycol (MIRALAX / GLYCOLAX) 17 g packet Take 17 g by mouth 2 (two) times daily. Patient taking differently: Take 17 g by mouth daily. 12/22/18   Rodolph Bong, MD  potassium chloride SA (K-DUR) 20 MEQ tablet Take 20 mEq by mouth daily.    [provider]  senna-docusate (SENOKOT-S) 8.6-50 MG tablet Take 1 tablet by mouth 2 (two) times daily. Patient taking differently: Take 2 tablets by mouth 2 (two) times daily. 12/22/18   Rodolph Bong, MD  tamsulosin (FLOMAX) 0.4 MG CAPS capsule Take 0.8 mg by mouth daily.    [provider]    Physical Exam: BP (!) 145/86   Pulse 97   Temp 98.5 F (36.9 C) (Oral)   Resp (!) 24   Ht 5\' 11"  (1.803 m)   Wt 102.1 kg   SpO2 97%   BMI 31.39 kg/m   General: 67 y.o. year-old male well developed well nourished in no acute distress.  Alert and oriented x3. Cardiovascular: Regular rate and rhythm with no rubs or gallops.  No thyromegaly or JVD noted.  No lower extremity edema. 2/4 pulses in all 4 extremities. Respiratory:  Clear to auscultation with no wheezes or rales. Good inspiratory effort. Abdomen: Soft nontender nondistended with normal bowel sounds x4 quadrants. Muskuloskeletal: No cyanosis, clubbing or edema noted bilaterally Neuro: CN II-XII intact, strength, sensation, reflexes Skin: No ulcerative lesions noted or rashes Psychiatry: Judgement and insight appear normal. Mood is appropriate for condition and setting          Labs on Admission:  Basic Metabolic Panel: Recent Labs  Lab 09/03/22 2309  NA 140  K 3.6  CL 104  CO2 29  GLUCOSE 105*  BUN 11  CREATININE 1.10  CALCIUM 8.9   Liver Function Tests: Recent Labs  Lab 09/03/22 2309  AST 42*  ALT 65*  ALKPHOS 105  BILITOT 1.2  PROT 7.4  ALBUMIN 4.4   No results for input(s): "LIPASE", "AMYLASE" in the last 168 hours. No results for input(s): "AMMONIA" in the last 168 hours. CBC: Recent Labs  Lab 09/03/22 2309  WBC 6.8  NEUTROABS 5.8  HGB 14.4  HCT 42.4  MCV 95.7  PLT 155   Cardiac Enzymes: No results for input(s): "CKTOTAL", "CKMB", "CKMBINDEX", "TROPONINI" in the last 168 hours.  BNP (last 3 results) No results for input(s): "BNP" in the last 8760 hours.  ProBNP (last 3 results) No results for input(s): "PROBNP" in the last 8760 hours.  CBG: No results for input(s): "GLUCAP" in the last 168 hours.  Radiological Exams on Admission: CT Head Wo Contrast  Result Date: 09/04/2022 CLINICAL DATA:  Lethargy. EXAM: CT HEAD WITHOUT CONTRAST TECHNIQUE: Contiguous axial images were obtained from the base of the skull through the vertex without intravenous contrast. RADIATION DOSE REDUCTION: This exam was performed according to the departmental dose-optimization program which includes automated exposure control, adjustment of the mA and/or kV according to patient size and/or use of iterative reconstruction technique. COMPARISON:  December 16, 2018 FINDINGS: Brain: There is moderate cerebral atrophy with widening of the extra-axial  spaces and ventricular dilatation. There are areas of decreased attenuation within the white matter tracts of the supratentorial brain, consistent with microvascular disease changes. Vascular: No hyperdense vessel or unexpected calcification. Skull: Normal. Negative for fracture or focal lesion. Sinuses/Orbits: A 15 mm x 11 mm right maxillary sinus polyp versus mucous retention cyst is seen. Other: None. IMPRESSION: 1. No acute intracranial abnormality. 2. Generalized cerebral atrophy with chronic white matter small vessel ischemic changes. Electronically Signed   By: Aram Candela M.D.   On: 09/04/2022 02:00   DG Chest Port 1 View  Result Date: 09/03/2022 CLINICAL DATA:  Weakness. EXAM: PORTABLE CHEST 1 VIEW COMPARISON:  January 29, 2022 FINDINGS: The heart size and mediastinal contours are within normal limits. Both lungs are clear. The visualized skeletal structures are unremarkable. IMPRESSION: No active disease. Electronically Signed   By: Aram Candela M.D.   On: 09/03/2022 23:35    EKG: I independently viewed the EKG done and my findings are as followed: Sinus rhythm rate of 97.  Nonspecific ST-T changes.  QTc 553.  Assessment/Plan Present on Admission: **None**  Principal Problem:   Generalized weakness  Generalized weakness, rule out MS flare versus others Sudden onset, last known well 6:30 PM on 09/03/2022 Follow MRI brain, if abnormal consult neurology PT OT assessment in the morning  Sepsis of unclear etiology Code sepsis called in the ED Fever Tmax 102.2, respiratory 29. Dental implant on 09/03/2022 Follow peripheral blood cultures x 2 Started on broad-spectrum IV antibiotics Monitor fever curve and WBCs  Lactic acidosis Lactic acid 4.1 Trend lactic acid Continue IV fluid hydration  Multiple sclerosis with chronic right lower extremity weakness Resume home regimen PT OT evaluation Fall precautions  Prolonged QTc QTc 553 on admission twelve-lead EKG Avoid  QTc prolonging agents Optimize magnesium and potassium levels  Hypertension, BP is not at goal, elevated Resume home oral antihypertensives. Closely monitor vital signs  Hyperlipidemia Resume home Lipitor  BPH Resume home Flomax and Proscar Monitor urine output  History of hiatal hernia Resume home PPI  GERD Resume home Protonix  Gout Resume home allopurinol  OSA Noncompliant with home CPAP CPAP nightly, receptive  Obesity BMI 31 Recommend weight loss outpatient with regular physical activity and healthy dieting.    DVT prophylaxis: Subcu Lovenox daily  Code Status: Full  code  Family Communication: None at bedside.  Disposition Plan: Admitted to progressive care unit  Consults called: None.  Admission status: Inpatient status.   Status is: Inpatient The patient requires at least 2 midnights for further evaluation and treatment of present condition.   Darlin Drop MD [facility] Pager [phone]  If 7PM-7AM, please contact night-coverage [email or web address] Password [email or web address]  09/04/2022, 3:57 AM

## 2022-09-04 NOTE — Progress Notes (Signed)
Pharmacy Antibiotic Note  James Gibbs is a 67 y.o. male admitted on 09/03/2022 with sepsis.  PMH significant for MS, HTN, DM.  In the ED patient received Vancomycin 1gm IV, Cefepime 2gm IV and Flagyl 500mg  IV x 1 dose each.  Pharmacy has been consulted for Vancomycin dosing.  Plan: Give an additional Vancomycin 1gm IV x 1 dose now (for total loading dose of 2gm) Vancomycin 750 mg IV Q 12 hrs. Goal AUC 400-550.  Expected AUC: 480  SCr used: 1.1 Cefepime 2gm IV q8h Flagyl per MD Follow renal function Monitor vancomycin levels as needed F/u culture results and sensitivities  Height: 5\' 11"  (180.3 cm) Weight: 102.1 kg (225 lb 1.4 oz) IBW/kg (Calculated) : 75.3  Temp (24hrs), Avg:100.8 F (38.2 C), Min:98.5 F (36.9 C), Max:102.2 F (39 C)  Recent Labs  Lab 09/03/22 2309 09/04/22 0229  WBC 6.8  --   CREATININE 1.10  --   LATICACIDVEN 1.3 4.1*    Estimated Creatinine Clearance: 78.2 mL/min (by C-G formula based on SCr of 1.1 mg/dL).    No Known Allergies  Antimicrobials this admission: 4/10 Cefepime >>   4/11 Metronidazole >>   4/11 Vancomycin >>  Dose adjustments this admission:    Microbiology results: 4/10 BCx:      Thank you for allowing pharmacy to be a part of this patient's care.  Maryellen Pile, PharmD 09/04/2022 4:25 AM

## 2022-09-04 NOTE — Sepsis Progress Note (Signed)
Elink monitoring for the code sepsis protocol.  

## 2022-09-04 NOTE — ED Notes (Signed)
ED TO INPATIENT HANDOFF REPORT  ED Nurse Name and Phone #: Deon Pillingzia [phone]  S Name/Age/Gender James Gibbs 67 y.o. male Room/Bed: WA20/WA20  Code Status   Code Status: Prior  Home/SNF/Other Home Patient oriented to: self, place, time, and situation Is this baseline? Yes   Triage Complete: Triage complete  Chief Complaint Generalized weakness [R53.1]  Triage Note Pt BIB EMS from home for more weakness/lethargy per usual. Per family, pt "is usually active but now can't get up on his own    Allergies No Known Allergies  Level of Care/Admitting Diagnosis ED Disposition     ED Disposition  Admit   Condition  --   Comment  [facility] [[id number]]  Level of Care: Progressive [102]  Admit to Progressive based on following criteria: MULTISYSTEM THREATS such as stable sepsis, metabolic/electrolyte imbalance with or without encephalopathy that is responding to early treatment.  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    Malignant hypertension    Mitral valve anterior leaflet prolapse    Mixed hyperlipidemia    Past Surgical History:  Procedure Laterality Date   COLONOSCOPY WITH PROPOFOL N/A 12/17/2018   Procedure: COLONOSCOPY WITH PROPOFOL;  Surgeon: Bernette RedbirdBuccini, Robert, MD;  Location: [facility];  Service: Endoscopy;  Laterality: N/A;   ESOPHAGOGASTRODUODENOSCOPY (EGD) WITH PROPOFOL N/A 12/14/2018    Procedure: ESOPHAGOGASTRODUODENOSCOPY (EGD) MODERATE SEDATION;  Surgeon: Bernette RedbirdBuccini, Robert, MD;  Location: [facility];  Service: Endoscopy;  Laterality: N/A;   GIVENS CAPSULE STUDY N/A 12/18/2018   Procedure: GIVENS CAPSULE STUDY;  Surgeon: Willis Modenautlaw, Shamal, MD;  Location: [facility];  Service: Endoscopy;  Laterality: N/A;   none       A IV Location/Drains/Wounds Patient Lines/Drains/Airways Status     Active Line/Drains/Airways     Name Placement date Placement time Site Days   Peripheral IV 09/03/22 20 G Left Antecubital 09/03/22  2332  Antecubital  1   Peripheral IV 09/03/22 20 G Left;Posterior Hand 09/03/22  2346  Hand  1            Intake/Output Last 24 hours No intake or output data in the 24 hours ending 09/04/22 0330  Labs/Imaging Results for orders placed or performed during the hospital encounter of 09/03/22 (from the past 48 hour(s))  Resp panel by RT-PCR (RSV, Flu A&B, Covid) Anterior Nasal Swab     Status: None   Collection Time: 09/03/22 11:04 PM   Specimen: Anterior Nasal Swab  Result Value Ref Range   SARS Coronavirus 2 by RT PCR NEGATIVE NEGATIVE    Comment: (NOTE) SARS-CoV-2 target nucleic acids are NOT DETECTED.  The SARS-CoV-2 RNA is generally detectable in upper respiratory specimens during the acute phase of infection. The lowest concentration of SARS-CoV-2 viral copies this assay can detect is 138 copies/mL. A negative result does not preclude SARS-Cov-2 infection and should not be used as the sole basis for treatment or other patient management decisions. A negative result may occur with  improper specimen collection/handling, submission of specimen other than nasopharyngeal swab, presence of viral mutation(s) within the areas targeted by this assay, and inadequate number of viral copies(<138 copies/mL). A negative result must be combined with clinical observations, patient history, and epidemiological information. The expected result is  Negative.  Fact Sheet for Patients:  BloggerCourse.com  Fact Sheet for Healthcare Providers:  SeriousBroker.it  This test is no t yet approved or cleared by the [place] FDA and  has been authorized for detection and/or diagnosis of SARS-CoV-2 by FDA under an Emergency Use Authorization (EUA). This EUA will remain  in effect (meaning this test can be used) for the duration of the COVID-19 declaration under Section 564(b)(1) of the Act, 21 U.S.C.section 360bbb-3(b)(1), unless the authorization is terminated  or revoked sooner.       Influenza A by PCR NEGATIVE NEGATIVE   Influenza B by PCR NEGATIVE NEGATIVE    Comment: (NOTE) The Xpert Xpress SARS-CoV-2/FLU/RSV plus assay is intended as an aid in the diagnosis of influenza from Nasopharyngeal swab specimens and should not be used as a sole basis for treatment. Nasal washings and aspirates are unacceptable for Xpert Xpress SARS-CoV-2/FLU/RSV testing.  Fact Sheet for Patients: BloggerCourse.com  Fact Sheet for Healthcare Providers: SeriousBroker.it  This test is not yet approved or cleared by the [place] FDA and has been authorized for detection and/or diagnosis of SARS-CoV-2 by FDA under an Emergency Use Authorization (EUA). This EUA will remain in effect (meaning this test can be used) for the duration of the COVID-19 declaration under Section 564(b)(1) of the Act, 21 U.S.C. section 360bbb-3(b)(1), unless the authorization is terminated or revoked.     Resp Syncytial Virus by PCR NEGATIVE NEGATIVE    Comment: (NOTE) Fact Sheet for Patients: BloggerCourse.com  Fact Sheet for Healthcare Providers: SeriousBroker.it  This test is not yet approved or cleared by the [place] FDA and has been authorized for detection and/or diagnosis of SARS-CoV-2 by FDA under an  Emergency Use Authorization (EUA). This EUA will remain in effect (meaning this test can be used) for the duration of the COVID-19 declaration under Section 564(b)(1) of the Act, 21 U.S.C. section 360bbb-3(b)(1), unless the authorization is terminated or revoked.  Performed at [facility], [address] [place], [place] [address]   Lactic acid, plasma     Status: None   Collection Time: 09/03/22 11:09 PM  Result Value Ref Range   Lactic Acid, Venous 1.3 0.5 - 1.9 mmol/L    Comment: Performed at [facility], [address] [place], [place] [address]  Comprehensive metabolic panel     Status: Abnormal   Collection Time: 09/03/22 11:09 PM  Result Value Ref Range   Sodium 140 135 - 145 mmol/L   Potassium 3.6 3.5 - 5.1 mmol/L   Chloride 104 98 - 111 mmol/L   CO2 29 22 - 32 mmol/L   Glucose, Bld 105 (H) 70 - 99 mg/dL    Comment: Glucose reference range applies only to samples taken after fasting for at least 8 hours.   BUN 11 8 - 23 mg/dL   Creatinine, Ser 7.67 0.61 - 1.24 mg/dL   Calcium 8.9 8.9 - 34.1 mg/dL   Total Protein 7.4 6.5 - 8.1 g/dL   Albumin 4.4 3.5 - 5.0 g/dL   AST 42 (H) 15 - 41 U/L   ALT 65 (H) 0 - 44 U/L   Alkaline Phosphatase 105 38 - 126 U/L   Total Bilirubin  1.2 0.3 - 1.2 mg/dL   GFR, Estimated >40 >98 mL/min    Comment: (NOTE) Calculated using the CKD-EPI Creatinine Equation (2021)    Anion gap 7 5 - 15    Comment: Performed at [facility], [address], [place], [place] [address]  CBC with Differential     Status: Abnormal   Collection Time: 09/03/22 11:09 PM  Result Value Ref Range   WBC 6.8 4.0 - 10.5 K/uL   RBC 4.43 4.22 - 5.81 MIL/uL   Hemoglobin 14.4 13.0 - 17.0 g/dL   HCT 78.2 95.6 - 21.3 %   MCV 95.7 80.0 - 100.0 fL   MCH 32.5 26.0 - 34.0 pg   MCHC 34.0 30.0 - 36.0 g/dL   RDW 08.6 57.8 - 46.9 %   Platelets 155 150 - 400 K/uL   nRBC 0.0 0.0 - 0.2 %   Neutrophils Relative % 85 %    Neutro Abs 5.8 1.7 - 7.7 K/uL   Lymphocytes Relative 6 %   Lymphs Abs 0.4 (L) 0.7 - 4.0 K/uL   Monocytes Relative 7 %   Monocytes Absolute 0.5 0.1 - 1.0 K/uL   Eosinophils Relative 2 %   Eosinophils Absolute 0.1 0.0 - 0.5 K/uL   Basophils Relative 0 %   Basophils Absolute 0.0 0.0 - 0.1 K/uL   Immature Granulocytes 0 %   Abs Immature Granulocytes 0.02 0.00 - 0.07 K/uL    Comment: Performed at [facility], [address], [place], [place] [address]  Protime-INR     Status: None   Collection Time: 09/03/22 11:09 PM  Result Value Ref Range   Prothrombin Time 13.4 11.4 - 15.2 seconds   INR 1.0 0.8 - 1.2    Comment: (NOTE) INR goal varies based on device and disease states. Performed at [facility], [address], [place], [place] [address]   APTT     Status: None   Collection Time: 09/03/22 11:09 PM  Result Value Ref Range   aPTT 30 24 - 36 seconds    Comment: Performed at [facility], [address], [place], [place] [address]  Urinalysis, w/ Reflex to Culture (Infection Suspected) -Urine, Clean Catch     Status: Abnormal   Collection Time: 09/04/22  2:12 AM  Result Value Ref Range   Specimen Source URINE, CLEAN CATCH    Color, Urine STRAW (A) YELLOW   APPearance CLEAR CLEAR   Specific Gravity, Urine 1.008 1.005 - 1.030   pH 6.0 5.0 - 8.0   Glucose, UA NEGATIVE NEGATIVE mg/dL   Hgb urine dipstick NEGATIVE NEGATIVE   Bilirubin Urine NEGATIVE NEGATIVE   Ketones, ur 5 (A) NEGATIVE mg/dL   Protein, ur NEGATIVE NEGATIVE mg/dL   Nitrite NEGATIVE NEGATIVE   Leukocytes,Ua NEGATIVE NEGATIVE   RBC / HPF 0-5 0 - 5 RBC/hpf   WBC, UA 0-5 0 - 5 WBC/hpf    Comment:        Reflex urine culture not performed if WBC <=10, OR if Squamous epithelial cells >5. If Squamous epithelial cells >5 suggest recollection.    Bacteria, UA NONE SEEN NONE SEEN   Squamous Epithelial / HPF 0-5 0 - 5 /HPF    Comment: Performed at [facility], [address], [place], [place] [address]   CT Head Wo Contrast  Result Date: 09/04/2022 CLINICAL DATA:  Lethargy. EXAM: CT HEAD WITHOUT CONTRAST TECHNIQUE: Contiguous axial images were obtained from the base  of the skull through the vertex without intravenous contrast. RADIATION DOSE REDUCTION: This exam was performed according to the departmental dose-optimization program which includes automated exposure control, adjustment of the mA and/or kV according to patient size and/or use of iterative reconstruction technique. COMPARISON:  December 16, 2018 FINDINGS: Brain: There is moderate cerebral atrophy with widening of the extra-axial spaces and ventricular dilatation. There are areas of decreased attenuation within the white matter tracts of the supratentorial brain, consistent with microvascular disease changes. Vascular: No hyperdense vessel or unexpected calcification. Skull: Normal. Negative for fracture or focal lesion. Sinuses/Orbits: A 15 mm x 11 mm right maxillary sinus polyp versus mucous retention cyst is seen. Other: None. IMPRESSION: 1. No acute intracranial abnormality. 2. Generalized cerebral atrophy with chronic white matter small vessel ischemic changes. Electronically Signed   By: Aram Candela M.D.   On: 09/04/2022 02:00   DG Chest Port 1 View  Result Date: 09/03/2022 CLINICAL DATA:  Weakness. EXAM: PORTABLE CHEST 1 VIEW COMPARISON:  January 29, 2022 FINDINGS: The heart size and mediastinal contours are within normal limits. Both lungs are clear. The visualized skeletal structures are unremarkable. IMPRESSION: No active disease. Electronically Signed   By: Aram Candela M.D.   On: 09/03/2022 23:35    Pending Labs Unresulted Labs (From admission, onward)     Start     Ordered   09/03/22 2304  Lactic acid, plasma  (Septic presentation on arrival (screening labs, nursing and treatment orders for obvious sepsis))  STAT Now then every 2 hours,   R (with STAT  occurrences)      09/03/22 2307   09/03/22 2304  Blood Culture (routine x 2)  (Septic presentation on arrival (screening labs, nursing and treatment orders for obvious sepsis))  BLOOD CULTURE X 2,   STAT      09/03/22 2307            Vitals/Pain Today's Vitals   09/04/22 0230 09/04/22 0232 09/04/22 0300 09/04/22 0319  BP: (!) 157/100  (!) 145/86   Pulse: 95  97   Resp: (!) 29  (!) 24   Temp:    98.5 F (36.9 C)  TempSrc:    Oral  SpO2: 98%  97%   Weight:      Height:      PainSc:  2       Isolation Precautions No active isolations  Medications Medications  lactated ringers infusion ( Intravenous New Bag/Given 09/04/22 0025)  sodium chloride 0.9 % bolus 1,000 mL (1,000 mLs Intravenous New Bag/Given 09/03/22 2339)    And  sodium chloride 0.9 % bolus 1,000 mL (1,000 mLs Intravenous Bolus 09/04/22 0027)    And  sodium chloride 0.9 % bolus 1,000 mL (1,000 mLs Intravenous New Bag/Given 09/04/22 0036)  ceFEPIme (MAXIPIME) 2 g in sodium chloride 0.9 % 100 mL IVPB (2 g Intravenous New Bag/Given 09/03/22 2338)  metroNIDAZOLE (FLAGYL) IVPB 500 mg (500 mg Intravenous New Bag/Given 09/04/22 0031)  vancomycin (VANCOCIN) IVPB 1000 mg/200 mL premix (1,000 mg Intravenous New Bag/Given 09/04/22 0024)  acetaminophen (TYLENOL) tablet 650 mg (650 mg Oral Given 09/04/22 0021)     Focused Assessments    R Recommendations: See Admitting Provider Note  Report given to:   Additional Notes:

## 2022-09-04 NOTE — Evaluation (Signed)
Occupational Therapy Evaluation Patient Details Name: James Gibbs MRN: [id number] DOB: 02-May-1956 Today's Date: 09/04/2022   History of Present Illness Pt is a 67 y/o male presenting w/ progressive L LE weakness and sepsis. CT negative, pending MRI. PMH: MS with R LE weakness, HTN, HLD, DM2, BPH, GERD, gout.   Clinical Impression   PTA, pt lives alone, reports Modified Independence with ADLs and mobility with Rollator vs upright walker. Pt presents now with deficits in strength (new LLE weakness), balance and endurance. Pt requires Mod A x 2 to stand with RW though unable to safely take steps to recliner; ultimately, required use of Stedy for transfer OOB. Pt requires Min A for UB ADL and up to Max A x 2 for LB ADLs completed in standing. Based on pt motivation and higher PLOF, recommend intensive postacute rehab to maximize independence/safety at home.      Recommendations for follow up therapy are one component of a multi-disciplinary discharge planning process, led by the attending physician.  Recommendations may be updated based on patient status, additional functional criteria and insurance authorization.   Assistance Recommended at Discharge Frequent or constant Supervision/Assistance  Patient can return home with the following Two people to help with walking and/or transfers;Two people to help with bathing/dressing/bathroom    Functional Status Assessment  Patient has had a recent decline in their functional status and demonstrates the ability to make significant improvements in function in a reasonable and predictable amount of time.  Equipment Recommendations  Other (comment) (TBD pending progress)    Recommendations for Other Services Rehab consult     Precautions / Restrictions Precautions Precautions: Fall;Other (comment) Precaution Comments: chronic R sided weakness Restrictions Weight Bearing Restrictions: No      Mobility Bed Mobility Overal bed mobility:  Needs Assistance Bed Mobility: Supine to Sit     Supine to sit: Mod assist, +2 for physical assistance, +2 for safety/equipment, HOB elevated     General bed mobility comments: assist for LE off of bed and lifting trunk    Transfers Overall transfer level: Needs assistance Equipment used: Rolling walker (2 wheels), Ambulation equipment used Transfers: Sit to/from Stand, Bed to chair/wheelchair/BSC Sit to Stand: Mod assist, +2 physical assistance, +2 safety/equipment           General transfer comment: Stood x 3 with RW with Mod A x2 and difficulty tuckinig in bottom for upright posture. Trialed Stedy with lighter Mod A x 2 to stand and transfer to chair. light Min A to stand from [employer] via Lift Equipment: Stedy    Balance Overall balance assessment: Needs assistance Sitting-balance support: No upper extremity supported, Feet supported Sitting balance-Leahy Scale: Fair Sitting balance - Comments: R lateral drift w/ dynamic tasks Postural control: Right lateral lean Standing balance support: Bilateral upper extremity supported, During functional activity Standing balance-Leahy Scale: Poor                             ADL either performed or assessed with clinical judgement   ADL Overall ADL's : Needs assistance/impaired Eating/Feeding: Independent   Grooming: Set up;Sitting;Wash/dry face   Upper Body Bathing: Minimal assistance;Sitting Upper Body Bathing Details (indicate cue type and reason): assist w/ back Lower Body Bathing: Moderate assistance;Sitting/lateral leans;Sit to/from stand;+2 for physical assistance;+2 for safety/equipment Lower Body Bathing Details (indicate cue type and reason): able to bathe reaching down to lower LE sitting EOB. assist for posterior hygiene standing with RW.  Then able to assist with anterior peri care standing in Stedy Upper Body Dressing : Minimal assistance;Sitting   Lower Body Dressing: Maximal assistance;+2  for safety/equipment;+2 for physical assistance;Sitting/lateral leans;Sit to/from stand       Toileting- ArchitectClothing Manipulation and Hygiene: Maximal assistance;+2 for physical assistance;+2 for safety/equipment;Sit to/from stand;Sitting/lateral lean         General ADL Comments: Limited by chronic R weakness and new L sided weakness     Vision Baseline Vision/History: 1 Wears glasses Ability to See in Adequate Light: 1 Impaired Patient Visual Report: No change from baseline Vision Assessment?: No apparent visual deficits     Perception     Praxis      Pertinent Vitals/Pain Pain Assessment Pain Assessment: No/denies pain     Hand Dominance Right   Extremity/Trunk Assessment Upper Extremity Assessment Upper Extremity Assessment: RUE deficits/detail RUE Deficits / Details: baseline R UE weakness though functional   Lower Extremity Assessment Lower Extremity Assessment: Defer to PT evaluation   Cervical / Trunk Assessment Cervical / Trunk Assessment: Normal   Communication Communication Communication: No difficulties   Cognition Arousal/Alertness: Awake/alert Behavior During Therapy: WFL for tasks assessed/performed Overall Cognitive Status: No family/caregiver present to determine baseline cognitive functioning                                 General Comments: slower processing, pleasant, follows directions w/ increased time.     General Comments       Exercises     Shoulder Instructions      Home Living Family/patient expects to be discharged to:: Private residence Living Arrangements: Alone Available Help at Discharge: Family Type of Home: House Home Access: Stairs to enter Secretary/administratorntrance Stairs-Number of Steps: 3 Entrance Stairs-Rails: Can reach both Home Layout: One level     Bathroom Shower/Tub: Producer, television/film/videoWalk-in shower   Bathroom Toilet: Handicapped height Bathroom Accessibility: Yes   Home Equipment: Rollator (4 wheels);BSC/3in1;Shower  seat;Hand held shower head;Grab bars - tub/shower;Other (comment) (upright walker)          Prior Functioning/Environment Prior Level of Function : Independent/Modified Independent             Mobility Comments: mobilizes with rollator in the home, uses upright walker in the community ADLs Comments: uses meals on wheels for L/D every day M-F he has cereal in morning and manages as needed on the weekends. reports managing ADLs w/o assist; has medical alert necklace (has access to when showering as well)        OT Problem List: Decreased strength;Decreased activity tolerance;Impaired balance (sitting and/or standing);Decreased cognition      OT Treatment/Interventions: Self-care/ADL training;Therapeutic exercise;Energy conservation;DME and/or AE instruction;Therapeutic activities;Patient/family education    OT Goals(Current goals can be found in the care plan section) Acute Rehab OT Goals Patient Stated Goal: get back to independence, get in chair OT Goal Formulation: With patient Time For Goal Achievement: 09/18/22 Potential to Achieve Goals: Good ADL Goals Pt Will Perform Lower Body Bathing: with min assist;sit to/from stand;sitting/lateral leans Pt Will Perform Lower Body Dressing: with min assist;sit to/from stand;sitting/lateral leans Pt Will Transfer to Toilet: with min assist;ambulating Pt Will Perform Toileting - Clothing Manipulation and hygiene: with min assist;sitting/lateral leans;sit to/from stand  OT Frequency: Min 2X/week    Co-evaluation PT/OT/SLP Co-Evaluation/Treatment: Yes Reason for Co-Treatment: To address functional/ADL transfers;For patient/therapist safety   OT goals addressed during session: ADL's and self-care;Proper use of Adaptive equipment and DME;Strengthening/ROM  AM-PAC OT "6 Clicks" Daily Activity     Outcome Measure Help from another person eating meals?: None Help from another person taking care of personal grooming?: A Little Help  from another person toileting, which includes using toliet, bedpan, or urinal?: A Lot Help from another person bathing (including washing, rinsing, drying)?: A Lot Help from another person to put on and taking off regular upper body clothing?: A Little Help from another person to put on and taking off regular lower body clothing?: A Lot 6 Click Score: 16   End of Session Equipment Utilized During Treatment: Gait belt;Rolling walker (2 wheels) Nurse Communication: Mobility status;Other (comment) (RN/NT present)  Activity Tolerance: Patient tolerated treatment well Patient left: in chair;with call bell/phone within reach;with chair alarm set;with nursing/sitter in room  OT Visit Diagnosis: Unsteadiness on feet (R26.81);Other abnormalities of gait and mobility (R26.89);Muscle weakness (generalized) (M62.81)                Time: 2979-8921 OT Time Calculation (min): 27 min Charges:  OT General Charges $OT Visit: 1 Visit OT Evaluation $OT Eval Moderate Complexity: 1 Mod  Bradd Canary, OTR/L Acute Rehab Services Office: [phone]   Lorre Munroe 09/04/2022, 12:59 PM

## 2022-09-04 NOTE — ED Provider Notes (Signed)
[place] [facility] AT [facility] Provider Note   CSN: [id number] Arrival date & time: 09/03/22  2210     History  Chief Complaint  Patient presents with   Weakness    MATILDE MARKIE is a 67 y.o. male.  HPI   Patient with medical history including hypertension, mitral valve prolapse, MS, presenting with complaints of generalized weakness.  Patient states started yesterday, states that he was laying in bed and did not have the strength to get up to close the blinds, he said he feels generally weak all over, no associated headaches change in vision paresthesias in the upper and or lower extremities no injury recent head trauma, not on anticoag.  States that he had a slight cough the day before, states it is productive, states he felt slightly chilled today, no associated chest pain stomach pains nausea vomiting diarrhea, denies any urinary symptoms, states that he did undergo a dental procedure today, had a root canal done.  Not endorse any worsening mouth pain.  Spoke to patient's daughter who informed me that patient lives alone, ambulates with a walker, has chronic right-sided weakness due to the MS, no recent falls, other than a slight cough no other complaints.  Home Medications Prior to Admission medications   Medication Sig Start Date End Date Taking? Authorizing Provider  allopurinol (ZYLOPRIM) 300 MG tablet Take 300 mg by mouth daily.    [provider]  amLODipine (NORVASC) 10 MG tablet Take 10 mg by mouth daily.    [provider]  Ascorbic Acid (VITAMIN C) 100 MG tablet Take 500 mg by mouth in the morning and at bedtime.    [provider]  atorvastatin (LIPITOR) 80 MG tablet Take 80 mg by mouth at bedtime.    [provider]  carboxymethylcellulose (REFRESH PLUS) 0.5 % SOLN Place 1 drop into both eyes 4 (four) times daily.    [provider]  cholecalciferol (VITAMIN D) 400 UNITS TABS tablet Take 400  Units by mouth daily.    [provider]  dalfampridine 10 MG TB12 Take 1 tablet by mouth every 12 (twelve) hours.    [provider]  Dimethyl Fumarate (TECFIDERA) 240 MG CPDR Take 240 mg by mouth 2 (two) times daily.    [provider]  finasteride (PROSCAR) 5 MG tablet Take 5 mg by mouth daily.    [provider]  Hydrochlorothiazide (MICROZIDE PO) Take 25 mg by mouth daily.    [provider]  lidocaine (LIDODERM) 5 % Place 1 patch onto the skin daily. Remove & Discard patch within 12 hours or as directed by MD    [provider]  lisinopril (PRINIVIL,ZESTRIL) 40 MG tablet Take 40 mg by mouth daily.    [provider]  METFORMIN HCL PO Take 500 mg by mouth daily.     [provider]  Multiple Vitamin (MULTIVITAMIN WITH MINERALS) TABS tablet Take 1 tablet by mouth daily.    [provider]  Omega-3 Fatty Acids (FISH OIL) 1000 MG CAPS Take 1,000 mg by mouth daily.    [provider]  pantoprazole (PROTONIX) 40 MG tablet Take 1 tablet (40 mg total) by mouth daily. Patient taking differently: Take 40 mg by mouth 2 (two) times daily. 12/22/18 04/05/22  Rodolph Bong, MD  polyethylene glycol (MIRALAX / GLYCOLAX) 17 g packet Take 17 g by mouth 2 (two) times daily. Patient taking differently: Take 17 g by mouth daily. 12/22/18  Rodolph Bonghompson, Daniel V, MD  potassium chloride SA (K-DUR) 20 MEQ tablet Take 20 mEq by mouth daily.    [provider]  senna-docusate (SENOKOT-S) 8.6-50 MG tablet Take 1 tablet by mouth 2 (two) times daily. Patient taking differently: Take 2 tablets by mouth 2 (two) times daily. 12/22/18   Rodolph Bonghompson, Daniel V, MD  tamsulosin (FLOMAX) 0.4 MG CAPS capsule Take 0.8 mg by mouth daily.    [provider]      Allergies    Patient has no known allergies.    Review of Systems   Review of Systems  Constitutional:  Positive for chills. Negative for fever.  Respiratory:   Positive for cough. Negative for shortness of breath.   Cardiovascular:  Negative for chest pain.  Gastrointestinal:  Negative for abdominal pain.  Neurological:  Positive for weakness. Negative for headaches.    Physical Exam Updated Vital Signs BP (!) 145/86   Pulse 97   Temp (!) 101.6 F (38.7 C) (Rectal)   Resp (!) 24   Ht 5\' 11"  (1.803 m)   Wt 102.1 kg   SpO2 97%   BMI 31.39 kg/m  Physical Exam Vitals and nursing note reviewed.  Constitutional:      General: He is not in acute distress.    Appearance: He is not ill-appearing.  HENT:     Head: Normocephalic and atraumatic.     Comments: There is no deformity of the head present no raccoon eyes or Battle sign noted.    Nose: No congestion.     Mouth/Throat:     Mouth: Mucous membranes are moist.     Pharynx: Oropharynx is clear.     Comments: No trismus no torticollis no oral trauma noted Eyes:     Extraocular Movements: Extraocular movements intact.     Conjunctiva/sclera: Conjunctivae normal.     Pupils: Pupils are equal, round, and reactive to light.  Cardiovascular:     Rate and Rhythm: Normal rate and regular rhythm.     Pulses: Normal pulses.     Heart sounds: No murmur heard.    No friction rub. No gallop.  Pulmonary:     Effort: No respiratory distress.     Breath sounds: No wheezing, rhonchi or rales.  Abdominal:     Palpations: Abdomen is soft.     Tenderness: There is no abdominal tenderness. There is no right CVA tenderness or left CVA tenderness.  Musculoskeletal:     Comments: Spine was palpated was nontender to palpation no step-off deformities noted.  No unilateral leg swelling no calf tenderness no palpable cords.  Skin:    General: Skin is warm and dry.  Neurological:     Mental Status: He is alert.     Comments: No facial asymmetry, no difficulty with word finding, can follow two-step commands, has noted right arm contracted, with slight decrease in strength in the right upper and right lower,  this is appear to be is his baseline.   Psychiatric:        Mood and Affect: Mood normal.     ED Results / Procedures / Treatments   Labs (all labs ordered are listed, but only abnormal results are displayed) Labs Reviewed  COMPREHENSIVE METABOLIC PANEL - Abnormal; Notable for the following components:      Result Value   Glucose, Bld 105 (*)    AST 42 (*)    ALT 65 (*)    All other components within normal limits  CBC WITH  DIFFERENTIAL/PLATELET - Abnormal; Notable for the following components:   Lymphs Abs 0.4 (*)    All other components within normal limits  URINALYSIS, W/ REFLEX TO CULTURE (INFECTION SUSPECTED) - Abnormal; Notable for the following components:   Color, Urine STRAW (*)    Ketones, ur 5 (*)    All other components within normal limits  RESP PANEL BY RT-PCR (RSV, FLU A&B, COVID)  RVPGX2  CULTURE, BLOOD (ROUTINE X 2)  CULTURE, BLOOD (ROUTINE X 2)  LACTIC ACID, PLASMA  PROTIME-INR  APTT  LACTIC ACID, PLASMA    EKG EKG Interpretation  Date/Time:  Wednesday September 03 2022 23:41:05 EDT Ventricular Rate:  97 PR Interval:  196 QRS Duration: 80 QT Interval:  435 QTC Calculation: 553 R Axis:   37 Text Interpretation: Sinus or ectopic atrial rhythm Nonspecific T abnormalities, lateral leads Confirmed by Gilda Crease [phone]) on 09/04/2022 1:00:30 AM  Radiology CT Head Wo Contrast  Result Date: 09/04/2022 CLINICAL DATA:  Lethargy. EXAM: CT HEAD WITHOUT CONTRAST TECHNIQUE: Contiguous axial images were obtained from the base of the skull through the vertex without intravenous contrast. RADIATION DOSE REDUCTION: This exam was performed according to the departmental dose-optimization program which includes automated exposure control, adjustment of the mA and/or kV according to patient size and/or use of iterative reconstruction technique. COMPARISON:  December 16, 2018 FINDINGS: Brain: There is moderate cerebral atrophy with widening of the extra-axial spaces and  ventricular dilatation. There are areas of decreased attenuation within the white matter tracts of the supratentorial brain, consistent with microvascular disease changes. Vascular: No hyperdense vessel or unexpected calcification. Skull: Normal. Negative for fracture or focal lesion. Sinuses/Orbits: A 15 mm x 11 mm right maxillary sinus polyp versus mucous retention cyst is seen. Other: None. IMPRESSION: 1. No acute intracranial abnormality. 2. Generalized cerebral atrophy with chronic white matter small vessel ischemic changes. Electronically Signed   By: Aram Candela M.D.   On: 09/04/2022 02:00   DG Chest Port 1 View  Result Date: 09/03/2022 CLINICAL DATA:  Weakness. EXAM: PORTABLE CHEST 1 VIEW COMPARISON:  January 29, 2022 FINDINGS: The heart size and mediastinal contours are within normal limits. Both lungs are clear. The visualized skeletal structures are unremarkable. IMPRESSION: No active disease. Electronically Signed   By: Aram Candela M.D.   On: 09/03/2022 23:35    Procedures .Critical Care  Performed by: Carroll Sage, PA-C Authorized by: Carroll Sage, PA-C   Critical care provider statement:    Critical care time (minutes):  30   Critical care time was exclusive of:  Separately billable procedures and treating other patients   Critical care was necessary to treat or prevent imminent or life-threatening deterioration of the following conditions:  Sepsis   Critical care was time spent personally by me on the following activities:  Development of treatment plan with patient or surrogate, discussions with consultants, evaluation of patient's response to treatment, examination of patient, ordering and review of laboratory studies, ordering and review of radiographic studies, ordering and performing treatments and interventions, pulse oximetry, re-evaluation of patient's condition and review of old charts   I assumed direction of critical care for this patient from  another provider in my specialty: no     Care discussed with: admitting provider       Medications Ordered in ED Medications  lactated ringers infusion ( Intravenous New Bag/Given 09/04/22 0025)  sodium chloride 0.9 % bolus 1,000 mL (1,000 mLs Intravenous New Bag/Given 09/03/22 2339)    And  sodium chloride 0.9 % bolus 1,000 mL (1,000 mLs Intravenous Bolus 09/04/22 0027)    And  sodium chloride 0.9 % bolus 1,000 mL (1,000 mLs Intravenous New Bag/Given 09/04/22 0036)  ceFEPIme (MAXIPIME) 2 g in sodium chloride 0.9 % 100 mL IVPB (2 g Intravenous New Bag/Given 09/03/22 2338)  metroNIDAZOLE (FLAGYL) IVPB 500 mg (500 mg Intravenous New Bag/Given 09/04/22 0031)  vancomycin (VANCOCIN) IVPB 1000 mg/200 mL premix (1,000 mg Intravenous New Bag/Given 09/04/22 0024)  acetaminophen (TYLENOL) tablet 650 mg (650 mg Oral Given 09/04/22 0021)    ED Course/ Medical Decision Making/ A&P                             Medical Decision Making Amount and/or Complexity of Data Reviewed Labs: ordered. Radiology: ordered. ECG/medicine tests: ordered.  Risk OTC drugs. Prescription drug management. Decision regarding hospitalization.   This patient presents to the ED for concern of weakness, this involves an extensive number of treatment options, and is a complaint that carries with it a high risk of complications and morbidity.  The differential diagnosis includes sepsis, ACS, intracranial bleed, CVA, MS    Additional history obtained:  Additional history obtained from daughter External records from outside source obtained and reviewed including recent hospitalization   Co morbidities that complicate the patient evaluation  MS  Social Determinants of Health:  N/A    Lab Tests:  I Ordered, and personally interpreted labs.  The pertinent results include: CBC is unremarkable, CMP shows glucose of 105, AST 42, ALT 65, prothrombin time/INR unremarkable APTT unremarkable, lactic 1.3,   Imaging  Studies ordered:  I ordered imaging studies including chest x-ray I independently visualized and interpreted imaging which showed negative acute findings I agree with the radiologist interpretation   Cardiac Monitoring:  The patient was maintained on a cardiac monitor.  I personally viewed and interpreted the cardiac monitored which showed an underlying rhythm of: EKG without signs of ischemia   Medicines ordered and prescription drug management:  I ordered medication including fluids I have reviewed the patients home medicines and have made adjustments as needed  Critical Interventions:  Patient presents weakness, heart rate above 90, with a rectal temperature of 101, will activate code sepsis, suspect upper respiratory, will start broad-spectrum antibiotics. Reassessed resting comfortably, vital signs remained stable   Reevaluation:  Reassessed the patient resting comfortably, recommend admission for further observation he is agreement this plan will consult hospitalist team  Consultations Obtained:  I requested consultation with the Dr. Margo Aye,  and discussed lab and imaging findings as well as pertinent plan - they recommend: She will admit the patient but does recommend touching base with neurology to ensure he does not to be transferred over due to [facility] Spoke with Dr. Derry Lory neurology, states patient can remain at [facility] long since there is no new deficits and recommends if symptoms or not improving would recommend MRI and reconsulting neurology.    Test Considered:  N/A    Rule out Suspicion for ACS is low at this time not endorse any chest pain, EKG without signs of ischemia.  I doubt CHF does not appear to be volume overloaded my exam, chest x-ray unremarkable for pleural effusions.  I doubt CVA as he has no new focal deficits present on exam, he does have weakness on the right side but this appears to be his baseline, patient states that this feels normal for him,  atypical to develop a fever  as well as CVA.  I doubt intracranial bleed denies any recent head trauma, no noted trauma on my exam, he is not on anticoag's.  Suspicion for intra-abdominal abnormality is low nonnursing stomach pains abdomen was soft nontender no associate nausea or vomiting.   Dispostion and problem list  After consideration of the diagnostic results and the patients response to treatment, I feel that the patent would benefit from admission.  Sepsis-unclear source at this time, on broad-spectrum antibiotics, will need further observation and likely antibiotic de-escalation. Weakness-likely secondary due to infection, but if weakness is not improving would consider MRI brain as well as spinal cord for rule out of possible MS flareup.            Final Clinical Impression(s) / ED Diagnoses Final diagnoses:  Weakness  Sepsis without acute organ dysfunction, due to unspecified organism    Rx / DC Orders ED Discharge Orders     None         Aryon, Nham, PA-C 09/04/22 [id number]    Gilda Crease, MD 09/04/22 [phone]

## 2022-09-04 NOTE — Progress Notes (Signed)
[facility] PROGRESS NOTE   James Gibbs ZOX:[id number] DOB: 1956/04/07 DOA: 09/03/2022  PCP: Greta Doom, MD  Brief History/Interval Summary: 67 y.o. male with medical history significant for MS with right lower extremity weakness at baseline, hypertension, hyperlipidemia, type 2 diabetes, BPH, GERD, gout, who presented to the [facility] ED from home due to sudden onset generalized weakness.  Patient went to [facility] on the day of admission to have dental implants.  He was feeling well afterwards and returned home.  His daughter brought him a meal from [employer] which he ate then took a nap around 6:30 PM.  When he woke up around 7 PM he could not move his left leg.  States his right foot is always weak.  EMS was activated and the patient was brought into the ED for further evaluation.  In the ED he was noted to be febrile.  Hospitalized for further management.  Consultants: None currently  Procedures: None    Subjective/Interval History: Patient mentioned that he feels better today compared to yesterday.  Is able to move his left lower extremity.  Has had a cough for the past 2 days.  Denies any sick contacts.  Denies any diarrhea.  No nausea or vomiting.  No dysuria.  Denies any mouth pain.    Assessment/Plan:  Generalized weakness Likely secondary to febrile illness which exacerbated his MS symptoms.  Seems to be better this morning.  PT and OT evaluation.  Fever Etiology unclear.  He did have a temperature of 102 F overnight in the emergency department.  No obvious source of infection identified.  UA unremarkable.  Chest x-ray does not show any pneumonia.  He does not have any GI symptoms.  He did have dental procedure recently.  Blood cultures are pending. He does mention a cough ongoing for 2 days.  Will also do a respiratory viral panel. His COVID-19 influenza and RSV PCR's were negative. Currently on broad-spectrum antibiotics which is reasonable in case he has  bacteremia.  Lactic acidosis Improved.  History of MS Followed at the [place]. Concern for MS exacerbation due to his lower extremity weakness.  MRI brain and MRI cervical spine is pending.  He is noted to be on dalfampridine. PT and OT evaluation.  Home medication is also shows that he is supposed to be on dimethyl fumarate.  However the medication list has not been reconciled yet.  QT prolongation Avoid QT prolonging medications.  Replace potassium.  Magnesium is 1.9.  Hypokalemia Will be repleted.  Essential hypertension Continue amlodipine  History of BPH Continue Flomax and Proscar.  Hyperlipidemia Continue statin.  GERD and hiatal hernia Continue PPI  History of gout Continue allopurinol  History of obstructive sleep apnea Continue CPAP.  Obesity Estimated body mass index is 31.52 kg/m as calculated from the following:   Height as of this encounter:  (1.803 m).   Weight as of this encounter: 102.5 kg.   DVT Prophylaxis: Lovenox Code Status: Full code Family Communication: Discussed with the patient Disposition Plan: To be determined  Status is: Inpatient Remains inpatient appropriate because: Fever, concern for MS exacerbation      Medications: Scheduled:  allopurinol  300 mg Oral Daily   amLODipine  10 mg Oral Daily   vitamin C  500 mg Oral Daily   atorvastatin  80 mg Oral QHS   cholecalciferol  400 Units Oral Daily   dalfampridine  10 mg Oral Q12H   enoxaparin (LOVENOX) injection  50 mg  Subcutaneous Q24H   finasteride  5 mg Oral Daily   pantoprazole  40 mg Oral Daily   polyvinyl alcohol  1 drop Both Eyes QID   tamsulosin  0.8 mg Oral Daily   Continuous:  ceFEPime (MAXIPIME) IV     lactated ringers 150 mL/hr at 09/04/22 0733   metronidazole     vancomycin     ZOX:[id number], melatonin, polyethylene glycol, prochlorperazine  Antibiotics: Anti-infectives (From admission, onward)    Start     Dose/Rate Route Frequency Ordered Stop    09/04/22 1700  vancomycin (VANCOREADY) IVPB 750 mg/150 mL        750 mg 150 mL/hr over 60 Minutes Intravenous Every 12 hours 09/04/22 0423     09/04/22 1200  metroNIDAZOLE (FLAGYL) IVPB 500 mg        500 mg 100 mL/hr over 60 Minutes Intravenous Every 12 hours 09/04/22 0355     09/04/22 0800  ceFEPIme (MAXIPIME) 2 g in sodium chloride 0.9 % 100 mL IVPB        2 g 200 mL/hr over 30 Minutes Intravenous Every 8 hours 09/04/22 0408     09/04/22 0430  vancomycin (VANCOCIN) IVPB 1000 mg/200 mL premix        1,000 mg 200 mL/hr over 60 Minutes Intravenous STAT 09/04/22 0421 09/04/22 0547   09/04/22 0400  vancomycin (VANCOCIN) IVPB 1000 mg/200 mL premix  Status:  Discontinued        1,000 mg 200 mL/hr over 60 Minutes Intravenous Every 24 hours 09/04/22 0355 09/04/22 0419   09/04/22 0400  ceFEPIme (MAXIPIME) 2 g in sodium chloride 0.9 % 100 mL IVPB  Status:  Discontinued        2 g 200 mL/hr over 30 Minutes Intravenous Every 24 hours 09/04/22 0355 09/04/22 0408   09/03/22 2315  ceFEPIme (MAXIPIME) 2 g in sodium chloride 0.9 % 100 mL IVPB        2 g 200 mL/hr over 30 Minutes Intravenous  Once 09/03/22 2307 09/04/22 0401   09/03/22 2315  metroNIDAZOLE (FLAGYL) IVPB 500 mg        500 mg 100 mL/hr over 60 Minutes Intravenous  Once 09/03/22 2307 09/04/22 0339   09/03/22 2315  vancomycin (VANCOCIN) IVPB 1000 mg/200 mL premix        1,000 mg 200 mL/hr over 60 Minutes Intravenous  Once 09/03/22 2307 09/04/22 0401       Objective:  Vital Signs  Vitals:   09/04/22 0300 09/04/22 0319 09/04/22 0426 09/04/22 0437  BP: (!) 145/86  (!) 142/88   Pulse: 97  85   Resp: (!) 24  20   Temp:  98.5 F (36.9 C) 99.1 F (37.3 C)   TempSrc:  Oral Oral   SpO2: 97%  99%   Weight:    102.5 kg  Height:    5\' 11"  (1.803 m)    Intake/Output Summary (Last 24 hours) at 09/04/2022 0944 Last data filed at 09/04/2022 0726 Gross per 24 hour  Intake 3229.07 ml  Output 1200 ml  Net 2029.07 ml   Filed Weights    09/04/22 0000 09/04/22 0437  Weight: 102.1 kg 102.5 kg    General appearance: Awake alert.  In no distress Resp: Clear to auscultation bilaterally.  Normal effort Cardio: S1-S2 is normal regular.  No S3-S4.  No rubs murmurs or bruit GI: Abdomen is soft.  Nontender nondistended.  Bowel sounds are present normal.  No masses organomegaly Extremities: No edema.   Neurologic: Alert  and oriented x3.  Improved mobility of the left lower extremity is noted.  No weakness of the upper extremities.   Lab Results:  Data Reviewed: I have personally reviewed following labs and reports of the imaging studies  CBC: Recent Labs  Lab 09/03/22 2309 09/04/22 0815  WBC 6.8 6.4  NEUTROABS 5.8 4.9  HGB 14.4 13.0  HCT 42.4 39.6  MCV 95.7 97.8  PLT 155 151    Basic Metabolic Panel: Recent Labs  Lab 09/03/22 2309 09/04/22 0815  NA 140 139  K 3.6 3.3*  CL 104 108  CO2 29 23  GLUCOSE 105* 122*  BUN 11 9  CREATININE 1.10 0.95  CALCIUM 8.9 8.1*  MG  --  1.9  PHOS  --  3.2    GFR: Estimated Creatinine Clearance: 90.7 mL/min (by C-G formula based on SCr of 0.95 mg/dL).  Liver Function Tests: Recent Labs  Lab 09/03/22 2309 09/04/22 0815  AST 42* 42*  ALT 65* 58*  ALKPHOS 105 86  BILITOT 1.2 0.6  PROT 7.4 6.2*  ALBUMIN 4.4 3.6     Coagulation Profile: Recent Labs  Lab 09/03/22 2309  INR 1.0      Recent Results (from the past 240 hour(s))  Resp panel by RT-PCR (RSV, Flu A&B, Covid) Anterior Nasal Swab     Status: None   Collection Time: 09/03/22 11:04 PM   Specimen: Anterior Nasal Swab  Result Value Ref Range Status   SARS Coronavirus 2 by RT PCR NEGATIVE NEGATIVE Final    Comment: (NOTE) SARS-CoV-2 target nucleic acids are NOT DETECTED.  The SARS-CoV-2 RNA is generally detectable in upper respiratory specimens during the acute phase of infection. The lowest concentration of SARS-CoV-2 viral copies this assay can detect is 138 copies/mL. A negative result does not  preclude SARS-Cov-2 infection and should not be used as the sole basis for treatment or other patient management decisions. A negative result may occur with  improper specimen collection/handling, submission of specimen other than nasopharyngeal swab, presence of viral mutation(s) within the areas targeted by this assay, and inadequate number of viral copies(<138 copies/mL). A negative result must be combined with clinical observations, patient history, and epidemiological information. The expected result is Negative.  Fact Sheet for Patients:  BloggerCourse.comhttps://www.fda.gov/media/152166/download  Fact Sheet for Healthcare Providers:  SeriousBroker.ithttps://www.fda.gov/media/152162/download  This test is no t yet approved or cleared by the [place] FDA and  has been authorized for detection and/or diagnosis of SARS-CoV-2 by FDA under an Emergency Use Authorization (EUA). This EUA will remain  in effect (meaning this test can be used) for the duration of the COVID-19 declaration under Section 564(b)(1) of the Act, 21 U.S.C.section 360bbb-3(b)(1), unless the authorization is terminated  or revoked sooner.       Influenza A by PCR NEGATIVE NEGATIVE Final   Influenza B by PCR NEGATIVE NEGATIVE Final    Comment: (NOTE) The Xpert Xpress SARS-CoV-2/FLU/RSV plus assay is intended as an aid in the diagnosis of influenza from Nasopharyngeal swab specimens and should not be used as a sole basis for treatment. Nasal washings and aspirates are unacceptable for Xpert Xpress SARS-CoV-2/FLU/RSV testing.  Fact Sheet for Patients: BloggerCourse.comhttps://www.fda.gov/media/152166/download  Fact Sheet for Healthcare Providers: SeriousBroker.ithttps://www.fda.gov/media/152162/download  This test is not yet approved or cleared by the [place] FDA and has been authorized for detection and/or diagnosis of SARS-CoV-2 by FDA under an Emergency Use Authorization (EUA). This EUA will remain in effect (meaning this test can be used) for the  duration of  the COVID-19 declaration under Section 564(b)(1) of the Act, 21 U.S.C. section 360bbb-3(b)(1), unless the authorization is terminated or revoked.     Resp Syncytial Virus by PCR NEGATIVE NEGATIVE Final    Comment: (NOTE) Fact Sheet for Patients: BloggerCourse.com  Fact Sheet for Healthcare Providers: SeriousBroker.it  This test is not yet approved or cleared by the [place] FDA and has been authorized for detection and/or diagnosis of SARS-CoV-2 by FDA under an Emergency Use Authorization (EUA). This EUA will remain in effect (meaning this test can be used) for the duration of the COVID-19 declaration under Section 564(b)(1) of the Act, 21 U.S.C. section 360bbb-3(b)(1), unless the authorization is terminated or revoked.  Performed at [facility], [address], [place], [place] [address]       Radiology Studies: CT Head Wo Contrast  Result Date: 09/04/2022 CLINICAL DATA:  Lethargy. EXAM: CT HEAD WITHOUT CONTRAST TECHNIQUE: Contiguous axial images were obtained from the base of the skull through the vertex without intravenous contrast. RADIATION DOSE REDUCTION: This exam was performed according to the departmental dose-optimization program which includes automated exposure control, adjustment of the mA and/or kV according to patient size and/or use of iterative reconstruction technique. COMPARISON:  December 16, 2018 FINDINGS: Brain: There is moderate cerebral atrophy with widening of the extra-axial spaces and ventricular dilatation. There are areas of decreased attenuation within the white matter tracts of the supratentorial brain, consistent with microvascular disease changes. Vascular: No hyperdense vessel or unexpected calcification. Skull: Normal. Negative for fracture or focal lesion. Sinuses/Orbits: A 15 mm x 11 mm right maxillary sinus polyp versus mucous retention cyst is seen. Other: None.  IMPRESSION: 1. No acute intracranial abnormality. 2. Generalized cerebral atrophy with chronic white matter small vessel ischemic changes. Electronically Signed   By: Aram Candela M.D.   On: 09/04/2022 02:00   DG Chest Port 1 View  Result Date: 09/03/2022 CLINICAL DATA:  Weakness. EXAM: PORTABLE CHEST 1 VIEW COMPARISON:  January 29, 2022 FINDINGS: The heart size and mediastinal contours are within normal limits. Both lungs are clear. The visualized skeletal structures are unremarkable. IMPRESSION: No active disease. Electronically Signed   By: Aram Candela M.D.   On: 09/03/2022 23:35       LOS: 0 days   Fatmata Legere Rito Ehrlich  [facility] Pager on [email or web address]  09/04/2022, 9:44 AM

## 2022-09-04 NOTE — Progress Notes (Signed)
PHARMACY NOTE:  ANTIMICROBIAL RENAL DOSAGE ADJUSTMENT  Current antimicrobial regimen includes a mismatch between antimicrobial dosage and estimated renal function.  As per policy approved by the Pharmacy & Therapeutics and Medical Executive Committees, the antimicrobial dosage will be adjusted accordingly.  Current antimicrobial dosage:  Cefepime 2gm IV q24h  Indication: unknown source  Renal Function:  Estimated Creatinine Clearance: 78.2 mL/min (by C-G formula based on SCr of 1.1 mg/dL).      Antimicrobial dosage has been changed to:  Cefepime 2gm IV q8h   Thank you for allowing pharmacy to be a part of this patient's care.  Maryellen Pile, [facility] 09/04/2022 4:09 AM

## 2022-09-05 DIAGNOSIS — R509 Fever, unspecified: Secondary | ICD-10-CM | POA: Diagnosis not present

## 2022-09-05 DIAGNOSIS — R531 Weakness: Secondary | ICD-10-CM | POA: Diagnosis not present

## 2022-09-05 DIAGNOSIS — G35 Multiple sclerosis: Secondary | ICD-10-CM | POA: Diagnosis not present

## 2022-09-05 LAB — RESPIRATORY PANEL BY PCR

## 2022-09-05 LAB — CBC
HCT: 42 % (ref 39.0–52.0)
Hemoglobin: 13.8 g/dL (ref 13.0–17.0)
MCH: 32.2 pg (ref 26.0–34.0)
MCHC: 32.9 g/dL (ref 30.0–36.0)
MCV: 97.9 fL (ref 80.0–100.0)
Platelets: 137 10*3/uL — ABNORMAL LOW (ref 150–400)
RBC: 4.29 MIL/uL (ref 4.22–5.81)
RDW: 14.3 % (ref 11.5–15.5)
WBC: 4.9 10*3/uL (ref 4.0–10.5)
nRBC: 0 % (ref 0.0–0.2)

## 2022-09-05 LAB — PROCALCITONIN: Procalcitonin: <0.1 ng/mL

## 2022-09-05 LAB — BASIC METABOLIC PANEL
Anion gap: 10 (ref 5–15)
BUN: 10 mg/dL (ref 8–23)
CO2: 25 mmol/L (ref 22–32)
Calcium: 8.3 mg/dL — ABNORMAL LOW (ref 8.9–10.3)
Chloride: 104 mmol/L (ref 98–111)
Creatinine, Ser: 0.97 mg/dL (ref 0.61–1.24)
GFR, Estimated: >60 mL/min (ref >60)
Glucose, Bld: 100 mg/dL — ABNORMAL HIGH (ref 70–99)
Potassium: 3.3 mmol/L — ABNORMAL LOW (ref 3.5–5.1)
Sodium: 139 mmol/L (ref 135–145)

## 2022-09-05 LAB — CULTURE, BLOOD (ROUTINE X 2): Culture: NO GROWTH

## 2022-09-05 LAB — GLUCOSE, CAPILLARY
Glucose-Capillary: 129 mg/dL — ABNORMAL HIGH (ref 70–99)
Glucose-Capillary: 133 mg/dL — ABNORMAL HIGH (ref 70–99)

## 2022-09-05 MED ORDER — DIMETHYL FUMARATE 240 MG PO CPDR
240.0000 mg | DELAYED_RELEASE_CAPSULE | Freq: Two times a day (BID) | ORAL | Status: DC
  Administered 2022-09-05 – 2022-09-09 (×9): 240 mg via ORAL

## 2022-09-05 MED ORDER — POTASSIUM CHLORIDE CRYS ER 20 MEQ PO TBCR
40.0000 meq | EXTENDED_RELEASE_TABLET | Freq: Once | ORAL | Status: AC
  Administered 2022-09-05: 40 meq via ORAL
  Filled 2022-09-05: qty 2

## 2022-09-05 NOTE — Progress Notes (Addendum)
[facility] PROGRESS NOTE   James Gibbs MGQ:[id number] DOB: 1956-02-04 DOA: 09/03/2022  PCP: Greta Doom, MD  Brief History/Interval Summary: 67 y.o. male with medical history significant for MS with right lower extremity weakness at baseline, hypertension, hyperlipidemia, type 2 diabetes, BPH, GERD, gout, who presented to the [facility] ED from home due to sudden onset generalized weakness.  Patient went to [facility] on the day of admission to have dental implants.  He was feeling well afterwards and returned home.  His daughter brought him a meal from [employer] which he ate then took a nap around 6:30 PM.  When he woke up around 7 PM he could not move his left leg.  States his right foot is always weak.  EMS was activated and the patient was brought into the ED for further evaluation.  In the ED he was noted to be febrile.  Hospitalized for further management.  Consultants: None currently  Procedures: None    Subjective/Interval History: Patient mentions that he continues to improve.  However he continues to have some weakness in his left leg. Not completely back to baseline.  Denies any chest pain.  Occasional cough is present.  No nausea or vomiting.     Assessment/Plan:  Generalized weakness Likely secondary to febrile illness which exacerbated his MS symptoms.  He is to be gradually improving.  Seen by physical therapy.  Inpatient rehabilitation is recommended.    Fever rhinovirus Etiology unclear.  He did have a temperature of 102 F in the emergency department.  No obvious source of infection identified.  UA unremarkable.  Chest x-ray does not show any pneumonia.  He does not have any GI symptoms.  He did have dental procedure recently.  Blood cultures are pending. He does mention a cough ongoing for 2 days.  His COVID-19 influenza and RSV PCR's were negative. Special viral panel is pending.  Blood cultures without any growth so far. If cultures remain negative for  the next 24 hours we will de-escalate antibiotics.  Procalcitonin is pending from this morning. Currently on broad-spectrum antibiotics which is reasonable in case he has bacteremia. ADDENDUM Respiratory viral panel is positive for rhinovirus.  Procalcitonin less than 0.1.  Will discontinue antibacterials.  Supportive care for rhinovirus.  Lactic acidosis Improved.  History of MS Followed at the [place]. He is noted to be on dalfampridine. Home medication is also shows that he is supposed to be on dimethyl fumarate.  However the medication list has not been reconciled yet.  Family to bring in his home medications today. Concern for MS exacerbation due to his lower extremity weakness.  Patient underwent MRI brain and cervical spine which shows only chronic changes without any acute findings.  This is reassuring.   Seen by PT and OT.  Inpatient rehabilitation is recommended.  QT prolongation Avoid QT prolonging medications.  Monitor electrolytes  Hypokalemia Replace potassium.  Magnesium was 1.9 yesterday.  Essential hypertension Continue amlodipine  History of BPH Continue Flomax and Proscar.  Hyperlipidemia Continue statin.  GERD and hiatal hernia Continue PPI  History of gout Continue allopurinol  History of obstructive sleep apnea Continue CPAP.  Obesity Estimated body mass index is 31.52 kg/m as calculated from the following:   Height as of this encounter: 5\' 11"  (1.803 m).   Weight as of this encounter: 102.5 kg.   DVT Prophylaxis: patient adamantly refusing to take Lovenox despite explanation of benefits. Will change to SCD's Code Status: Full code Family Communication: Discussed  with the patient Disposition Plan: Inpatient rehabilitation  Status is: Inpatient Remains inpatient appropriate because: Fever, concern for MS exacerbation      Medications: Scheduled:  allopurinol  300 mg Oral Daily   amLODipine  10 mg Oral Daily   vitamin C  500 mg Oral Daily    atorvastatin  80 mg Oral QHS   cholecalciferol  400 Units Oral Daily   dalfampridine  10 mg Oral Q12H   enoxaparin (LOVENOX) injection  50 mg Subcutaneous Q24H   finasteride  5 mg Oral Daily   pantoprazole  40 mg Oral Daily   polyvinyl alcohol  1 drop Both Eyes QID   potassium chloride  40 mEq Oral Once   tamsulosin  0.8 mg Oral Daily   Continuous:  ceFEPime (MAXIPIME) IV 2 g (09/05/22 0144)   metronidazole 500 mg (09/04/22 2328)   vancomycin 750 mg (09/05/22 0515)   JXB:[id number], melatonin, phenol, polyethylene glycol, prochlorperazine  Antibiotics: Anti-infectives (From admission, onward)    Start     Dose/Rate Route Frequency Ordered Stop   09/04/22 1700  vancomycin (VANCOREADY) IVPB 750 mg/150 mL        750 mg 150 mL/hr over 60 Minutes Intravenous Every 12 hours 09/04/22 0423     09/04/22 1200  metroNIDAZOLE (FLAGYL) IVPB 500 mg        500 mg 100 mL/hr over 60 Minutes Intravenous Every 12 hours 09/04/22 0355     09/04/22 0800  ceFEPIme (MAXIPIME) 2 g in sodium chloride 0.9 % 100 mL IVPB        2 g 200 mL/hr over 30 Minutes Intravenous Every 8 hours 09/04/22 0408     09/04/22 0430  vancomycin (VANCOCIN) IVPB 1000 mg/200 mL premix        1,000 mg 200 mL/hr over 60 Minutes Intravenous STAT 09/04/22 0421 09/04/22 0547   09/04/22 0400  vancomycin (VANCOCIN) IVPB 1000 mg/200 mL premix  Status:  Discontinued        1,000 mg 200 mL/hr over 60 Minutes Intravenous Every 24 hours 09/04/22 0355 09/04/22 0419   09/04/22 0400  ceFEPIme (MAXIPIME) 2 g in sodium chloride 0.9 % 100 mL IVPB  Status:  Discontinued        2 g 200 mL/hr over 30 Minutes Intravenous Every 24 hours 09/04/22 0355 09/04/22 0408   09/03/22 2315  ceFEPIme (MAXIPIME) 2 g in sodium chloride 0.9 % 100 mL IVPB        2 g 200 mL/hr over 30 Minutes Intravenous  Once 09/03/22 2307 09/04/22 0401   09/03/22 2315  metroNIDAZOLE (FLAGYL) IVPB 500 mg        500 mg 100 mL/hr over 60 Minutes Intravenous  Once 09/03/22  2307 09/04/22 0339   09/03/22 2315  vancomycin (VANCOCIN) IVPB 1000 mg/200 mL premix        1,000 mg 200 mL/hr over 60 Minutes Intravenous  Once 09/03/22 2307 09/04/22 0401       Objective:  Vital Signs  Vitals:   09/04/22 1149 09/04/22 2126 09/04/22 2219 09/05/22 0527  BP: (!) 160/91 (!) 150/78  (!) 140/94  Pulse:  78 78 70  Resp:   18 20  Temp:  99.9 F (37.7 C)  98.6 F (37 C)  TempSrc:  Oral  Oral  SpO2:  99% 94% 99%  Weight:      Height:        Intake/Output Summary (Last 24 hours) at 09/05/2022 0834 Last data filed at 09/05/2022 0530 Gross per 24  hour  Intake 540 ml  Output 4675 ml  Net -4135 ml    Filed Weights   09/04/22 0000 09/04/22 0437  Weight: 102.1 kg 102.5 kg    General appearance: Awake alert.  In no distress Resp: Clear to auscultation bilaterally.  Normal effort Cardio: S1-S2 is normal regular.  No S3-S4.  No rubs murmurs or bruit GI: Abdomen is soft.  Nontender nondistended.  Bowel sounds are present normal.  No masses organomegaly Extremities: No edema.   Improved strength in the left lower extremity.  No other focal neurological deficits noted.  He has chronic weakness in his right lower extremity.  Lab Results:  Data Reviewed: I have personally reviewed following labs and reports of the imaging studies  CBC: Recent Labs  Lab 09/03/22 2309 09/04/22 0815 09/05/22 0521  WBC 6.8 6.4 4.9  NEUTROABS 5.8 4.9  --   HGB 14.4 13.0 13.8  HCT 42.4 39.6 42.0  MCV 95.7 97.8 97.9  PLT 155 151 137*     Basic Metabolic Panel: Recent Labs  Lab 09/03/22 2309 09/04/22 0815 09/05/22 0521  NA 140 139 139  K 3.6 3.3* 3.3*  CL 104 108 104  CO2 29 23 25   GLUCOSE 105* 122* 100*  BUN 11 9 10   CREATININE 1.10 0.95 0.97  CALCIUM 8.9 8.1* 8.3*  MG  --  1.9  --   PHOS  --  3.2  --      GFR: Estimated Creatinine Clearance: 88.9 mL/min (by C-G formula based on SCr of 0.97 mg/dL).  Liver Function Tests: Recent Labs  Lab 09/03/22 2309  09/04/22 0815  AST 42* 42*  ALT 65* 58*  ALKPHOS 105 86  BILITOT 1.2 0.6  PROT 7.4 6.2*  ALBUMIN 4.4 3.6      Coagulation Profile: Recent Labs  Lab 09/03/22 2309  INR 1.0       Recent Results (from the past 240 hour(s))  Resp panel by RT-PCR (RSV, Flu A&B, Covid) Anterior Nasal Swab     Status: None   Collection Time: 09/03/22 11:04 PM   Specimen: Anterior Nasal Swab  Result Value Ref Range Status   SARS Coronavirus 2 by RT PCR NEGATIVE NEGATIVE Final    Comment: (NOTE) SARS-CoV-2 target nucleic acids are NOT DETECTED.  The SARS-CoV-2 RNA is generally detectable in upper respiratory specimens during the acute phase of infection. The lowest concentration of SARS-CoV-2 viral copies this assay can detect is 138 copies/mL. A negative result does not preclude SARS-Cov-2 infection and should not be used as the sole basis for treatment or other patient management decisions. A negative result may occur with  improper specimen collection/handling, submission of specimen other than nasopharyngeal swab, presence of viral mutation(s) within the areas targeted by this assay, and inadequate number of viral copies(<138 copies/mL). A negative result must be combined with clinical observations, patient history, and epidemiological information. The expected result is Negative.  Fact Sheet for Patients:  BloggerCourse.com  Fact Sheet for Healthcare Providers:  SeriousBroker.it  This test is no t yet approved or cleared by the [place] FDA and  has been authorized for detection and/or diagnosis of SARS-CoV-2 by FDA under an Emergency Use Authorization (EUA). This EUA will remain  in effect (meaning this test can be used) for the duration of the COVID-19 declaration under Section 564(b)(1) of the Act, 21 U.S.C.section 360bbb-3(b)(1), unless the authorization is terminated  or revoked sooner.       Influenza A by PCR NEGATIVE  NEGATIVE  Final   Influenza B by PCR NEGATIVE NEGATIVE Final    Comment: (NOTE) The Xpert Xpress SARS-CoV-2/FLU/RSV plus assay is intended as an aid in the diagnosis of influenza from Nasopharyngeal swab specimens and should not be used as a sole basis for treatment. Nasal washings and aspirates are unacceptable for Xpert Xpress SARS-CoV-2/FLU/RSV testing.  Fact Sheet for Patients: BloggerCourse.com  Fact Sheet for Healthcare Providers: SeriousBroker.it  This test is not yet approved or cleared by the [place] FDA and has been authorized for detection and/or diagnosis of SARS-CoV-2 by FDA under an Emergency Use Authorization (EUA). This EUA will remain in effect (meaning this test can be used) for the duration of the COVID-19 declaration under Section 564(b)(1) of the Act, 21 U.S.C. section 360bbb-3(b)(1), unless the authorization is terminated or revoked.     Resp Syncytial Virus by PCR NEGATIVE NEGATIVE Final    Comment: (NOTE) Fact Sheet for Patients: BloggerCourse.com  Fact Sheet for Healthcare Providers: SeriousBroker.it  This test is not yet approved or cleared by the [place] FDA and has been authorized for detection and/or diagnosis of SARS-CoV-2 by FDA under an Emergency Use Authorization (EUA). This EUA will remain in effect (meaning this test can be used) for the duration of the COVID-19 declaration under Section 564(b)(1) of the Act, 21 U.S.C. section 360bbb-3(b)(1), unless the authorization is terminated or revoked.  Performed at [facility], [address] [place], [place] [address]   Blood Culture (routine x 2)     Status: None (Preliminary result)   Collection Time: 09/03/22 11:07 PM   Specimen: BLOOD  Result Value Ref Range Status   Specimen Description   Final    BLOOD BLOOD RIGHT HAND Performed at [facility], [address], [place], [place] [address]    Special Requests   Final    BOTTLES DRAWN AEROBIC ONLY Blood Culture results may not be optimal due to an inadequate volume of blood received in culture bottles Performed at [facility], [address], [place], [place] [address]    Culture   Final    NO GROWTH 1 DAY Performed at [facility], [address] [place], [place] [address]    Report Status PENDING  Incomplete  Blood Culture (routine x 2)     Status: None (Preliminary result)   Collection Time: 09/03/22 11:09 PM   Specimen: BLOOD  Result Value Ref Range Status   Specimen Description   Final    BLOOD LEFT ANTECUBITAL Performed at [facility], [address], [place], [place] [address]    Special Requests   Final    BOTTLES DRAWN AEROBIC AND ANAEROBIC Blood Culture adequate volume Performed at [facility], [address], [place], [place] [address]    Culture   Final    NO GROWTH 1 DAY Performed at [facility], [address], [place], [place] [address]    Report Status PENDING  Incomplete  MRSA Next Gen by PCR, Nasal     Status: None   Collection Time: 09/04/22  6:27 PM   Specimen: Nasal Mucosa; Nasal Swab  Result Value Ref Range Status   MRSA by PCR Next Gen NOT DETECTED NOT DETECTED Final    Comment: (NOTE) The GeneXpert MRSA Assay (FDA approved for NASAL specimens only), is one component of a comprehensive MRSA colonization surveillance program. It is not intended to diagnose MRSA infection nor to guide or monitor treatment  for MRSA infections. Test performance is not FDA approved in patients less than 21 years old. Performed at [facility], [address], [place], [place] [address]       Radiology Studies: MR CERVICAL SPINE W WO CONTRAST  Result Date: 09/04/2022 CLINICAL DATA:  History of multiple sclerosis with lower extremity weakness. EXAM: MRI CERVICAL  SPINE WITHOUT AND WITH CONTRAST TECHNIQUE: Multiplanar and multiecho pulse sequences of the cervical spine, to include the craniocervical junction and cervicothoracic junction, were obtained without and with intravenous contrast. CONTRAST:  10mL GADAVIST GADOBUTROL 1 MMOL/ML IV SOLN COMPARISON:  Cervical spine MRI 01/30/2022. FINDINGS: Alignment: There is straightening of the normal cervical lordosis. There is no antero or retrolisthesis. Vertebrae: Vertebral body heights are preserved. Background marrow signal is normal. There is multilevel degenerative endplate signal abnormality without edema. A peripherally T1 hyperintense lesion in the T2 vertebral body is unchanged and favored benign. There is no other focal marrow signal abnormality, marrow edema, or enhancement. Cord: There is T2 signal abnormality in the left aspect of the cord at C2-C3 (8-17), right aspect of the cord at C3-C4 (8-20), and in the right and left aspect of the cord at C5-C6 (8-31, 8-32). Findings are not significantly changed compared to the prior MRI and are consistent with provided history of demyelinating disease. There is no new, progressive, or enhancing cord signal abnormality to suggest active demyelination. Posterior Fossa, vertebral arteries, paraspinal tissues: The posterior fossa is assessed on the separately dictated brain MRI. The vertebral artery flow voids are normal. The paraspinal soft tissues are unremarkable. Disc levels: C2-C3: There is a shallow disc protrusion and mild left worse than right facet arthropathy without significant spinal canal or neural foraminal stenosis, unchanged. C3-C4: There is prominent left worse than right uncovertebral ridging with a shallow posterior disc osteophyte complex and mild left worse than right facet arthropathy resulting in mild-to-moderate left and no significant right neural foraminal stenosis and no significant spinal canal stenosis, unchanged. C4-C5: There is left worse than right  uncovertebral ridging and mild facet arthropathy resulting in moderate left and mild right neural foraminal stenosis without significant spinal canal stenosis, unchanged C5-C6: There is an inferiorly migrated disc extrusion, bilateral uncovertebral ridging, and mild facet arthropathy resulting in mild-to-moderate spinal canal stenosis with slight flattening of the ventral cord surface, and moderate left and mild right neural foraminal stenosis, unchanged C6-C7: There is left worse than right uncovertebral ridging and mild facet arthropathy resulting in mild left and no significant right neural foraminal stenosis and no significant spinal canal stenosis, unchanged. C7-T1: Mild uncovertebral and facet arthropathy resulting in mild right worse than left neural foraminal stenosis and no significant spinal canal stenosis, unchanged T1-T2: Only imaged in the sagittal plane. There is right-sided endplate spurring and right foraminal/extraforaminal disc protrusion without significant spinal canal or neural foraminal stenosis. IMPRESSION: 1. Patchy signal abnormality in the cervical cord as detailed above consistent with provided history of multiple sclerosis is unchanged since the prior MRI from 01/29/2022. No abnormal enhancement to suggest active demyelination. 2. Multilevel degenerative changes throughout the cervical spine as above, stable since the prior study and most notable at C5-C6 where there is an inferiorly migrated disc extrusion resulting in mild-to-moderate spinal canal stenosis and moderate left and mild right neural foraminal stenosis. 3. Peripherally T1 hyperintense lesion in the T2 vertebral body is unchanged and favored benign. Recommend continued attention on follow-up studies. Electronically Signed   By: Lesia Hausen M.D.   On: 09/04/2022 18:22  MR BRAIN W WO CONTRAST  Result Date: 09/04/2022 CLINICAL DATA:  Multiple sclerosis, evaluate for acute flare. EXAM: MRI HEAD WITHOUT AND WITH CONTRAST  TECHNIQUE: Multiplanar, multiecho pulse sequences of the brain and surrounding structures were obtained without and with intravenous contrast. CONTRAST:  10mL GADAVIST GADOBUTROL 1 MMOL/ML IV SOLN COMPARISON:  Same-day CT head, brain MRI 01/30/2022 FINDINGS: Brain: There is no evidence of acute intracranial hemorrhage, extra-axial fluid collection, or acute infarct Background parenchymal volume loss with prominence of the ventricular system and extra-axial CSF spaces is unchanged. The ventricles are stable in size compared to the prior brain MRI. Again seen is extensive white matter signal abnormality throughout the supratentorial brain with numerous areas of T1 hypointensity. Findings may reflect sequela of advanced chronic small-vessel ischemic change and/or multiple sclerosis with T1 hypointensity reflecting remote infarcts versus axonal loss related to demyelinating disease. There is no abnormal enhancement to suggest active demyelination. The pituitary and suprasellar region are normal. There is no mass lesion or abnormal enhancement. There is no mass effect or midline shift. Vascular: Normal flow voids. Skull and upper cervical spine: Normal marrow signal. Sinuses/Orbits: There is mild mucosal thickening in the paranasal sinuses. The globes and orbits are unremarkable. Other: None. IMPRESSION: 1. No acute intracranial pathology or significant change since the MRI from 01/29/2022. 2. Extensive white matter signal abnormality may reflect sequela of multiple sclerosis and/or advanced chronic small-vessel ischemic change. No abnormal enhancement to suggest active demyelination. Electronically Signed   By: Lesia Hausen M.D.   On: 09/04/2022 18:11   CT Head Wo Contrast  Result Date: 09/04/2022 CLINICAL DATA:  Lethargy. EXAM: CT HEAD WITHOUT CONTRAST TECHNIQUE: Contiguous axial images were obtained from the base of the skull through the vertex without intravenous contrast. RADIATION DOSE REDUCTION: This exam was  performed according to the departmental dose-optimization program which includes automated exposure control, adjustment of the mA and/or kV according to patient size and/or use of iterative reconstruction technique. COMPARISON:  December 16, 2018 FINDINGS: Brain: There is moderate cerebral atrophy with widening of the extra-axial spaces and ventricular dilatation. There are areas of decreased attenuation within the white matter tracts of the supratentorial brain, consistent with microvascular disease changes. Vascular: No hyperdense vessel or unexpected calcification. Skull: Normal. Negative for fracture or focal lesion. Sinuses/Orbits: A 15 mm x 11 mm right maxillary sinus polyp versus mucous retention cyst is seen. Other: None. IMPRESSION: 1. No acute intracranial abnormality. 2. Generalized cerebral atrophy with chronic white matter small vessel ischemic changes. Electronically Signed   By: Aram Candela M.D.   On: 09/04/2022 02:00   DG Chest Port 1 View  Result Date: 09/03/2022 CLINICAL DATA:  Weakness. EXAM: PORTABLE CHEST 1 VIEW COMPARISON:  January 29, 2022 FINDINGS: The heart size and mediastinal contours are within normal limits. Both lungs are clear. The visualized skeletal structures are unremarkable. IMPRESSION: No active disease. Electronically Signed   By: Aram Candela M.D.   On: 09/03/2022 23:35       LOS: 1 day   Osvaldo Shipper  [facility] Pager on [email or web address]  09/05/2022, 8:34 AM

## 2022-09-05 NOTE — Progress Notes (Signed)
Physical Therapy Treatment Patient Details Name: James Gibbs MRN: [id number] DOB: 04-26-1956 Today's Date: 09/05/2022   History of Present Illness Pt is a 67 y/o male presenting w/ progressive L LE weakness and sepsis. Pt with rhinovirus.  MRI negative for MS flair. PMH: MS with R LE weakness, HTN, HLD, DM2, BPH, GERD, gout.    PT Comments    Pt demonstrating significant improvement today with goals met and updated.  He does still require increased time and cues throughout session for posture and safety.  Pt ambulated 50'x2 with min guard and chair follow.  L LE strength has improved. Would benefit from rollator next session.  Continue plan of care as pt lives alone.     Recommendations for follow up therapy are one component of a multi-disciplinary discharge planning process, led by the attending physician.  Recommendations may be updated based on patient status, additional functional criteria and insurance authorization.  Follow Up Recommendations       Assistance Recommended at Discharge Intermittent Supervision/Assistance  Patient can return home with the following A little help with walking and/or transfers;A little help with bathing/dressing/bathroom;Assistance with cooking/housework;Help with stairs or ramp for entrance   Equipment Recommendations  None recommended by PT    Recommendations for Other Services Rehab consult     Precautions / Restrictions Precautions Precautions: Fall Precaution Comments: chronic R sided weakness     Mobility  Bed Mobility Overal bed mobility: Needs Assistance Bed Mobility: Supine to Sit     Supine to sit: Min assist          Transfers Overall transfer level: Needs assistance Equipment used: Rolling walker (2 wheels) Transfers: Sit to/from Stand Sit to Stand: Min guard, Min assist           General transfer comment: Performed x 5 during session with initial min A progressing to min guard.  Requiring cues for upright  posture and knees straight    Ambulation/Gait Ambulation/Gait assistance: Min guard, +2 safety/equipment Gait Distance (Feet): 50 Feet (50'x2) Assistive device: Rolling walker (2 wheels) Gait Pattern/deviations: Step-to pattern, Decreased stride length, Trunk flexed Gait velocity: decreased     General Gait Details: Pt with gradual increase in trunk and knee flexion with gait requiring cues to correct.  Also, cues for RW proximity particularly with turns.  Pt expressed typically uses rollator   Stairs             Wheelchair Mobility    Modified Rankin (Stroke Patients Only)       Balance Overall balance assessment: Needs assistance Sitting-balance support: No upper extremity supported, Feet supported Sitting balance-Leahy Scale: Fair Sitting balance - Comments: R lateral drift w/ dynamic tasks   Standing balance support: Bilateral upper extremity supported, During functional activity, Reliant on assistive device for balance Standing balance-Leahy Scale: Poor                              Cognition Arousal/Alertness: Awake/alert Behavior During Therapy: WFL for tasks assessed/performed Overall Cognitive Status: No family/caregiver present to determine baseline cognitive functioning                                 General Comments: slower processing, pleasant, follows directions w/ increased time.        Exercises General Exercises - Lower Extremity Long Arc Quad: AROM, Both, 10 reps, Seated Other Exercises Other Exercises:  Mini-squats x 10 with RW and focus on terminal knee ext    General Comments        Pertinent Vitals/Pain Pain Assessment Pain Assessment: No/denies pain    Home Living                          Prior Function            PT Goals (current goals can now be found in the care plan section) Acute Rehab PT Goals Time For Goal Achievement: 09/19/22 Potential to Achieve Goals: Good Progress towards  PT goals: Goals met and updated - see care plan    Frequency    Min 1X/week      PT Plan Current plan remains appropriate    Co-evaluation              AM-PAC PT "6 Clicks" Mobility   Outcome Measure  Help needed turning from your back to your side while in a flat bed without using bedrails?: A Little Help needed moving from lying on your back to sitting on the side of a flat bed without using bedrails?: A Little Help needed moving to and from a bed to a chair (including a wheelchair)?: A Little Help needed standing up from a chair using your arms (e.g., wheelchair or bedside chair)?: A Little Help needed to walk in hospital room?: A Little Help needed climbing 3-5 steps with a railing? : A Lot 6 Click Score: 17    End of Session Equipment Utilized During Treatment: Gait belt Activity Tolerance: Patient tolerated treatment well Patient left: in chair;with call bell/phone within reach;with chair alarm set Nurse Communication: Mobility status PT Visit Diagnosis: Muscle weakness (generalized) (M62.81);Difficulty in walking, not elsewhere classified (R26.2);Unsteadiness on feet (R26.81);Other abnormalities of gait and mobility (R26.89)     Time: 5397-6734 PT Time Calculation (min) (ACUTE ONLY): 35 min  Charges:  $Gait Training: 8-22 mins $Therapeutic Activity: 8-22 mins                     Anise Salvo, PT Acute Rehab [facility] [phone]    James Gibbs 09/05/2022, 2:07 PM

## 2022-09-05 NOTE — Progress Notes (Signed)
Inpatient Rehab Admissions Coordinator:  ? ?Per therapy recommendations,  patient was screened for CIR candidacy by Mase Dhondt, MS, CCC-SLP. At this time, Pt. Appears to be a a potential candidate for CIR. I will place   order for rehab consult per protocol for full assessment. Please contact me any with questions. ? ?Rawson Minix, MS, CCC-SLP ?Rehab Admissions Coordinator  ?[phone] (celll) ?[phone] (office) ? ?

## 2022-09-06 DIAGNOSIS — B348 Other viral infections of unspecified site: Secondary | ICD-10-CM | POA: Diagnosis not present

## 2022-09-06 DIAGNOSIS — G35 Multiple sclerosis: Secondary | ICD-10-CM | POA: Diagnosis not present

## 2022-09-06 LAB — BASIC METABOLIC PANEL
Anion gap: 9 (ref 5–15)
BUN: 13 mg/dL (ref 8–23)
CO2: 26 mmol/L (ref 22–32)
Calcium: 8.6 mg/dL — ABNORMAL LOW (ref 8.9–10.3)
Chloride: 104 mmol/L (ref 98–111)
Creatinine, Ser: 0.97 mg/dL (ref 0.61–1.24)
GFR, Estimated: >60 mL/min (ref >60)
Glucose, Bld: 175 mg/dL — ABNORMAL HIGH (ref 70–99)
Potassium: 3.6 mmol/L (ref 3.5–5.1)
Sodium: 139 mmol/L (ref 135–145)

## 2022-09-06 LAB — CULTURE, BLOOD (ROUTINE X 2): Culture: NO GROWTH

## 2022-09-06 MED ORDER — POTASSIUM CHLORIDE CRYS ER 20 MEQ PO TBCR
40.0000 meq | EXTENDED_RELEASE_TABLET | Freq: Once | ORAL | Status: AC
  Administered 2022-09-06: 40 meq via ORAL
  Filled 2022-09-06: qty 2

## 2022-09-06 NOTE — TOC Initial Note (Signed)
Transition of Care [facility]) - Initial/Assessment Note    Patient Details  Name: James Gibbs MRN: [id number] Date of Birth: 03-Aug-1955  Transition of Care [facility]) CM/SW Contact:    Otelia Santee, LCSW Phone Number: 09/06/2022, 10:22 AM  Clinical Narrative:                 Pt currently being recommended for CIR. TOC will continue to follow for discharge plan and needs.   Expected Discharge Plan: IP Rehab Facility Barriers to Discharge: Insurance Authorization   Patient Goals and CMS Choice Patient states their goals for this hospitalization and ongoing recovery are:: UTA CMS Medicare.gov Compare Post Acute Care list provided to:: Patient Choice offered to / list presented to : Patient [place] ownership interest in [facility]provided to:: Patient    Expected Discharge Plan and Services In-house Referral: NA Discharge Planning Services: NA Post Acute Care Choice: IP Rehab Living arrangements for the past 2 months: Single Family Home                 DME Arranged: N/A DME Agency: NA                  Prior Living Arrangements/Services Living arrangements for the past 2 months: Single Family Home Lives with:: Self Patient language and need for interpreter reviewed:: Yes Do you feel safe going back to the place where you live?: Yes      Need for Family Participation in Patient Care: No (Comment) Care giver support system in place?: No (comment) Current home services: DME (rollator) Criminal Activity/Legal Involvement Pertinent to Current Situation/Hospitalization: No - Comment as needed  Activities of Daily Living Home Assistive Devices/Equipment: Walker (specify type) ADL Screening (condition at time of admission) Patient's cognitive ability adequate to safely complete daily activities?: Yes Is the patient deaf or have difficulty hearing?: No Does the patient have difficulty seeing, even when wearing glasses/contacts?: No Does the patient have  difficulty concentrating, remembering, or making decisions?: No Patient able to express need for assistance with ADLs?: Yes Does the patient have difficulty dressing or bathing?: Yes Independently performs ADLs?: Yes (appropriate for developmental age) Does the patient have difficulty walking or climbing stairs?: Yes Weakness of Legs: Both Weakness of Arms/Hands: None  Permission Sought/Granted Permission sought to share information with : Facility Industrial/product designer granted to share information with : No              Emotional Assessment Appearance:: Appears stated age Attitude/Demeanor/Rapport: Unable to Assess Affect (typically observed): Unable to Assess Orientation: : Oriented to Self, Oriented to Place, Oriented to  Time, Oriented to Situation Alcohol / Substance Use: Not Applicable Psych Involvement: No (comment)  Admission diagnosis:  Weakness [R53.1] Generalized weakness [R53.1] Sepsis without acute organ dysfunction, due to unspecified organism [A41.9] Patient Active Problem List   Diagnosis Date Noted   Generalized weakness 09/04/2022   COVID-19 virus infection 01/30/2022   Multiple sclerosis 01/30/2022   COVID-19 01/30/2022   Acute blood loss anemia    GIB (gastrointestinal bleeding) 12/13/2018   HTN (hypertension) 12/13/2018   Syncope, vasovagal 12/13/2018   Hypotension 12/13/2018   Gout 12/13/2018   HYPERTENSION, UNCONTROLLED 06/04/2009   OBESITY, UNSPECIFIED 08/15/2008   MITRAL REGURGITATION 08/15/2008   MITRAL VALVE PROLAPSE 08/15/2008   PCP:  Greta Doom, MD Pharmacy:   [facility] - [place], [place] - [address] [place] [address] Phone: [phone] Fax: [phone]  Social Determinants of Health (SDOH) Social History: SDOH Screenings   Food Insecurity: No Food Insecurity (09/04/2022)  Housing: Low Risk  (09/04/2022)  Transportation Needs:  No Transportation Needs (09/04/2022)  Utilities: Not At Risk (09/04/2022)  Tobacco Use: Low Risk  (09/03/2022)   SDOH Interventions:     Readmission Risk Interventions    09/06/2022   10:20 AM  Readmission Risk Prevention Plan  Transportation Screening Complete  PCP or Specialist Appt within 5-7 Days Complete  Home Care Screening Complete  Medication Review (RN CM) Complete

## 2022-09-06 NOTE — Progress Notes (Signed)
[facility] PROGRESS NOTE   James Gibbs ZOX:[id number] DOB: 1956/05/26 DOA: 09/03/2022  PCP: Greta Doom, MD  Brief History/Interval Summary: 67 y.o. male with medical history significant for MS with right lower extremity weakness at baseline, hypertension, hyperlipidemia, type 2 diabetes, BPH, GERD, gout, who presented to the [facility] ED from home due to sudden onset generalized weakness.  Patient went to [facility] on the day of admission to have dental implants.  He was feeling well afterwards and returned home.  His daughter brought him a meal from [employer] which he ate then took a nap around 6:30 PM.  When he woke up around 7 PM he could not move his left leg.  States his right foot is always weak.  EMS was activated and the patient was brought into the ED for further evaluation.  In the ED he was noted to be febrile.  Hospitalized for further management.  Consultants: None currently  Procedures: None    Subjective/Interval History: Patient mentions that he continues to improve.  However he continues to have some weakness in his left leg. Not completely back to baseline.  Denies any chest pain.  Occasional cough is present.  No nausea or vomiting.     Assessment/Plan:  Generalized weakness Generalized weakness triggered by rhinovirus infection.  Seems to be improving.  Seen by physical therapy.  Inpatient rehabilitation is recommended.     Rhinovirus respiratory infection He did have a temperature of 102 F in the emergency department.  No obvious source of infection was initially identified.  UA unremarkable.  Chest x-ray does not show any pneumonia.  He does not have any GI symptoms.  He did have dental procedure recently.   He did mention cough.  COVID-19 influenza and RSV PCR's were negative.  Respiratory viral panel positive for rhinovirus which explains his fever.  Cultures negative so far.  Procalcitonin was unremarkable.  Broad-spectrum antibiotics were  discontinued yesterday.  Lactic acidosis Improved.  History of MS Followed at the [place]. He is noted to be on dalfampridine. Home medication is also shows that he is supposed to be on dimethyl fumarate.  However the medication list has not been reconciled yet.  Family to bring in his home medications today. Concern for MS exacerbation due to his lower extremity weakness.  Patient underwent MRI brain and cervical spine which shows only chronic changes without any acute findings.  This is reassuring.   Seen by PT and OT.  Inpatient rehabilitation is recommended.  QT prolongation Avoid QT prolonging medications.  Monitor electrolytes  Hypokalemia Repleted  Essential hypertension Continue amlodipine  History of BPH Continue Flomax and Proscar.  Hyperlipidemia Continue statin.  GERD and hiatal hernia Continue PPI  History of gout Continue allopurinol  History of obstructive sleep apnea Continue CPAP.  Obesity Estimated body mass index is 31.52 kg/m as calculated from the following:   Height as of this encounter:  (1.803 m).   Weight as of this encounter: 102.5 kg.   DVT Prophylaxis: SCD's Code Status: Full code Family Communication: Discussed with the patient Disposition Plan: Inpatient rehabilitation  Status is: Inpatient Remains inpatient appropriate because: Fever, concern for MS exacerbation      Medications: Scheduled:  allopurinol  300 mg Oral Daily   amLODipine  10 mg Oral Daily   vitamin C  500 mg Oral Daily   atorvastatin  80 mg Oral QHS   cholecalciferol  400 Units Oral Daily   dalfampridine  10 mg Oral  Q12H   Dimethyl Fumarate  240 mg Oral BID   finasteride  5 mg Oral Daily   pantoprazole  40 mg Oral Daily   polyvinyl alcohol  1 drop Both Eyes QID   tamsulosin  0.8 mg Oral Daily   Continuous:   ZOX:[id number], melatonin, phenol, polyethylene glycol, prochlorperazine  Antibiotics: Anti-infectives (From admission, onward)    Start      Dose/Rate Route Frequency Ordered Stop   09/04/22 1700  vancomycin (VANCOREADY) IVPB 750 mg/150 mL  Status:  Discontinued        750 mg 150 mL/hr over 60 Minutes Intravenous Every 12 hours 09/04/22 0423 09/05/22 0957   09/04/22 1200  metroNIDAZOLE (FLAGYL) IVPB 500 mg  Status:  Discontinued        500 mg 100 mL/hr over 60 Minutes Intravenous Every 12 hours 09/04/22 0355 09/05/22 0957   09/04/22 0800  ceFEPIme (MAXIPIME) 2 g in sodium chloride 0.9 % 100 mL IVPB  Status:  Discontinued        2 g 200 mL/hr over 30 Minutes Intravenous Every 8 hours 09/04/22 0408 09/05/22 0957   09/04/22 0430  vancomycin (VANCOCIN) IVPB 1000 mg/200 mL premix        1,000 mg 200 mL/hr over 60 Minutes Intravenous STAT 09/04/22 0421 09/04/22 0547   09/04/22 0400  vancomycin (VANCOCIN) IVPB 1000 mg/200 mL premix  Status:  Discontinued        1,000 mg 200 mL/hr over 60 Minutes Intravenous Every 24 hours 09/04/22 0355 09/04/22 0419   09/04/22 0400  ceFEPIme (MAXIPIME) 2 g in sodium chloride 0.9 % 100 mL IVPB  Status:  Discontinued        2 g 200 mL/hr over 30 Minutes Intravenous Every 24 hours 09/04/22 0355 09/04/22 0408   09/03/22 2315  ceFEPIme (MAXIPIME) 2 g in sodium chloride 0.9 % 100 mL IVPB        2 g 200 mL/hr over 30 Minutes Intravenous  Once 09/03/22 2307 09/04/22 0401   09/03/22 2315  metroNIDAZOLE (FLAGYL) IVPB 500 mg        500 mg 100 mL/hr over 60 Minutes Intravenous  Once 09/03/22 2307 09/04/22 0339   09/03/22 2315  vancomycin (VANCOCIN) IVPB 1000 mg/200 mL premix        1,000 mg 200 mL/hr over 60 Minutes Intravenous  Once 09/03/22 2307 09/04/22 0401       Objective:  Vital Signs  Vitals:   09/05/22 1334 09/05/22 2141 09/06/22 0434 09/06/22 0435  BP: 138/86 134/69  (!) 146/81  Pulse: 81 71  78  Resp: Temp: 97.8 F (36.6 C)  97.9 F (36.6 C) 97.9 F (36.6 C)  TempSrc:   Oral   SpO2: 99% 99%  99%  Weight:      Height:        Intake/Output Summary (Last 24 hours) at  09/06/2022 0947 Last data filed at 09/06/2022 0433 Gross per 24 hour  Intake 460 ml  Output 3250 ml  Net -2790 ml    Filed Weights   09/04/22 0000 09/04/22 0437  Weight: 102.1 kg 102.5 kg    General appearance: Awake alert.  In no distress Resp: Clear to auscultation bilaterally.  Normal effort Cardio: S1-S2 is normal regular.  No S3-S4.  No rubs murmurs or bruit GI: Abdomen is soft.  Nontender nondistended.  Bowel sounds are present normal.  No masses organomegaly Extremities: No edema.  Improving range of motion of bilateral lower extremities. No  new focal neurological deficits identified.  Lab Results:  Data Reviewed: I have personally reviewed following labs and reports of the imaging studies  CBC: Recent Labs  Lab 09/03/22 2309 09/04/22 0815 09/05/22 0521  WBC 6.8 6.4 4.9  NEUTROABS 5.8 4.9  --   HGB 14.4 13.0 13.8  HCT 42.4 39.6 42.0  MCV 95.7 97.8 97.9  PLT 155 151 137*     Basic Metabolic Panel: Recent Labs  Lab 09/03/22 2309 09/04/22 0815 09/05/22 0521 09/06/22 0438  NA 140 139 139 139  K 3.6 3.3* 3.3* 3.6  CL 104 108 104 104  CO2 29 23 25 26   GLUCOSE 105* 122* 100* 175*  BUN 11 9 10 13   CREATININE 1.10 0.95 0.97 0.97  CALCIUM 8.9 8.1* 8.3* 8.6*  MG  --  1.9  --   --   PHOS  --  3.2  --   --      GFR: Estimated Creatinine Clearance: 88.9 mL/min (by C-G formula based on SCr of 0.97 mg/dL).  Liver Function Tests: Recent Labs  Lab 09/03/22 2309 09/04/22 0815  AST 42* 42*  ALT 65* 58*  ALKPHOS 105 86  BILITOT 1.2 0.6  PROT 7.4 6.2*  ALBUMIN 4.4 3.6      Coagulation Profile: Recent Labs  Lab 09/03/22 2309  INR 1.0       Recent Results (from the past 240 hour(s))  Resp panel by RT-PCR (RSV, Flu A&B, Covid) Anterior Nasal Swab     Status: None   Collection Time: 09/03/22 11:04 PM   Specimen: Anterior Nasal Swab  Result Value Ref Range Status   SARS Coronavirus 2 by RT PCR NEGATIVE NEGATIVE Final    Comment:  (NOTE) SARS-CoV-2 target nucleic acids are NOT DETECTED.  The SARS-CoV-2 RNA is generally detectable in upper respiratory specimens during the acute phase of infection. The lowest concentration of SARS-CoV-2 viral copies this assay can detect is 138 copies/mL. A negative result does not preclude SARS-Cov-2 infection and should not be used as the sole basis for treatment or other patient management decisions. A negative result may occur with  improper specimen collection/handling, submission of specimen other than nasopharyngeal swab, presence of viral mutation(s) within the areas targeted by this assay, and inadequate number of viral copies(<138 copies/mL). A negative result must be combined with clinical observations, patient history, and epidemiological information. The expected result is Negative.  Fact Sheet for Patients:  BloggerCourse.com  Fact Sheet for Healthcare Providers:  SeriousBroker.it  This test is no t yet approved or cleared by the [place] FDA and  has been authorized for detection and/or diagnosis of SARS-CoV-2 by FDA under an Emergency Use Authorization (EUA). This EUA will remain  in effect (meaning this test can be used) for the duration of the COVID-19 declaration under Section 564(b)(1) of the Act, 21 U.S.C.section 360bbb-3(b)(1), unless the authorization is terminated  or revoked sooner.       Influenza A by PCR NEGATIVE NEGATIVE Final   Influenza B by PCR NEGATIVE NEGATIVE Final    Comment: (NOTE) The Xpert Xpress SARS-CoV-2/FLU/RSV plus assay is intended as an aid in the diagnosis of influenza from Nasopharyngeal swab specimens and should not be used as a sole basis for treatment. Nasal washings and aspirates are unacceptable for Xpert Xpress SARS-CoV-2/FLU/RSV testing.  Fact Sheet for Patients: BloggerCourse.com  Fact Sheet for Healthcare  Providers: SeriousBroker.it  This test is not yet approved or cleared by the [place] and has been authorized  for detection and/or diagnosis of SARS-CoV-2 by FDA under an Emergency Use Authorization (EUA). This EUA will remain in effect (meaning this test can be used) for the duration of the COVID-19 declaration under Section 564(b)(1) of the Act, 21 U.S.C. section 360bbb-3(b)(1), unless the authorization is terminated or revoked.     Resp Syncytial Virus by PCR NEGATIVE NEGATIVE Final    Comment: (NOTE) Fact Sheet for Patients: BloggerCourse.com  Fact Sheet for Healthcare Providers: SeriousBroker.it  This test is not yet approved or cleared by the [place] FDA and has been authorized for detection and/or diagnosis of SARS-CoV-2 by FDA under an Emergency Use Authorization (EUA). This EUA will remain in effect (meaning this test can be used) for the duration of the COVID-19 declaration under Section 564(b)(1) of the Act, 21 U.S.C. section 360bbb-3(b)(1), unless the authorization is terminated or revoked.  Performed at [facility], [address], [place], [place] [address]   Blood Culture (routine x 2)     Status: None (Preliminary result)   Collection Time: 09/03/22 11:07 PM   Specimen: BLOOD  Result Value Ref Range Status   Specimen Description   Final    BLOOD BLOOD RIGHT HAND Performed at [facility], [address], [place], [place] [address]    Special Requests   Final    BOTTLES DRAWN AEROBIC ONLY Blood Culture results may not be optimal due to an inadequate volume of blood received in culture bottles Performed at [facility], [address], [place], [place] [address]    Culture   Final    NO GROWTH 2 DAYS Performed at [facility], [address], [place], [place] [address]    Report Status PENDING  Incomplete   Blood Culture (routine x 2)     Status: None (Preliminary result)   Collection Time: 09/03/22 11:09 PM   Specimen: BLOOD  Result Value Ref Range Status   Specimen Description   Final    BLOOD LEFT ANTECUBITAL Performed at [facility], [address] [place], [place] [address]    Special Requests   Final    BOTTLES DRAWN AEROBIC AND ANAEROBIC Blood Culture adequate volume Performed at [facility], [address], [place], [place] [address]    Culture   Final    NO GROWTH 2 DAYS Performed at [facility], [address] [place], [place] [address]    Report Status PENDING  Incomplete  Respiratory (~20 pathogens) panel by PCR     Status: Abnormal   Collection Time: 09/04/22  5:44 PM   Specimen: Nasopharyngeal Swab; Respiratory  Result Value Ref Range Status   Adenovirus NOT DETECTED NOT DETECTED Final   Coronavirus 229E NOT DETECTED NOT DETECTED Final    Comment: (NOTE) The Coronavirus on the Respiratory Panel, DOES NOT test for the novel  Coronavirus (2019 nCoV)    Coronavirus HKU1 NOT DETECTED NOT DETECTED Final   Coronavirus NL63 NOT DETECTED NOT DETECTED Final   Coronavirus OC43 NOT DETECTED NOT DETECTED Final   Metapneumovirus NOT DETECTED NOT DETECTED Final   Rhinovirus / Enterovirus DETECTED (A) NOT DETECTED Final   Influenza A NOT DETECTED NOT DETECTED Final   Influenza B NOT DETECTED NOT DETECTED Final   Parainfluenza Virus 1 NOT DETECTED NOT DETECTED Final   Parainfluenza Virus 2 NOT DETECTED NOT DETECTED Final   Parainfluenza Virus 3 NOT DETECTED NOT DETECTED Final   Parainfluenza Virus 4 NOT DETECTED NOT  DETECTED Final   Respiratory Syncytial Virus NOT DETECTED NOT DETECTED Final   Bordetella pertussis NOT DETECTED NOT DETECTED Final   Bordetella Parapertussis NOT DETECTED NOT DETECTED Final   Chlamydophila pneumoniae NOT DETECTED NOT DETECTED Final   Mycoplasma pneumoniae NOT DETECTED NOT DETECTED Final    Comment:  Performed at [facility], [address], [place], [place] [address]  MRSA Next Gen by PCR, Nasal     Status: None   Collection Time: 09/04/22  6:27 PM   Specimen: Nasal Mucosa; Nasal Swab  Result Value Ref Range Status   MRSA by PCR Next Gen NOT DETECTED NOT DETECTED Final    Comment: (NOTE) The GeneXpert MRSA Assay (FDA approved for NASAL specimens only), is one component of a comprehensive MRSA colonization surveillance program. It is not intended to diagnose MRSA infection nor to guide or monitor treatment for MRSA infections. Test performance is not FDA approved in patients less than 42 years old. Performed at [facility], [address], [place], [place] [address]       Radiology Studies: MR CERVICAL SPINE W WO CONTRAST  Result Date: 09/04/2022 CLINICAL DATA:  History of multiple sclerosis with lower extremity weakness. EXAM: MRI CERVICAL SPINE WITHOUT AND WITH CONTRAST TECHNIQUE: Multiplanar and multiecho pulse sequences of the cervical spine, to include the craniocervical junction and cervicothoracic junction, were obtained without and with intravenous contrast. CONTRAST:  10mL GADAVIST GADOBUTROL 1 MMOL/ML IV SOLN COMPARISON:  Cervical spine MRI 01/30/2022. FINDINGS: Alignment: There is straightening of the normal cervical lordosis. There is no antero or retrolisthesis. Vertebrae: Vertebral body heights are preserved. Background marrow signal is normal. There is multilevel degenerative endplate signal abnormality without edema. A peripherally T1 hyperintense lesion in the T2 vertebral body is unchanged and favored benign. There is no other focal marrow signal abnormality, marrow edema, or enhancement. Cord: There is T2 signal abnormality in the left aspect of the cord at C2-C3 (8-17), right aspect of the cord at C3-C4 (8-20), and in the right and left aspect of the cord at C5-C6 (8-31, 8-32). Findings are not significantly changed compared to the prior MRI and  are consistent with provided history of demyelinating disease. There is no new, progressive, or enhancing cord signal abnormality to suggest active demyelination. Posterior Fossa, vertebral arteries, paraspinal tissues: The posterior fossa is assessed on the separately dictated brain MRI. The vertebral artery flow voids are normal. The paraspinal soft tissues are unremarkable. Disc levels: C2-C3: There is a shallow disc protrusion and mild left worse than right facet arthropathy without significant spinal canal or neural foraminal stenosis, unchanged. C3-C4: There is prominent left worse than right uncovertebral ridging with a shallow posterior disc osteophyte complex and mild left worse than right facet arthropathy resulting in mild-to-moderate left and no significant right neural foraminal stenosis and no significant spinal canal stenosis, unchanged. C4-C5: There is left worse than right uncovertebral ridging and mild facet arthropathy resulting in moderate left and mild right neural foraminal stenosis without significant spinal canal stenosis, unchanged C5-C6: There is an inferiorly migrated disc extrusion, bilateral uncovertebral ridging, and mild facet arthropathy resulting in mild-to-moderate spinal canal stenosis with slight flattening of the ventral cord surface, and moderate left and mild right neural foraminal stenosis, unchanged C6-C7: There is left worse than right uncovertebral ridging and mild facet arthropathy resulting in mild left and no significant right neural foraminal stenosis and no significant spinal canal stenosis, unchanged. C7-T1: Mild uncovertebral and facet arthropathy resulting in mild right  worse than left neural foraminal stenosis and no significant spinal canal stenosis, unchanged T1-T2: Only imaged in the sagittal plane. There is right-sided endplate spurring and right foraminal/extraforaminal disc protrusion without significant spinal canal or neural foraminal stenosis. IMPRESSION:  1. Patchy signal abnormality in the cervical cord as detailed above consistent with provided history of multiple sclerosis is unchanged since the prior MRI from 01/29/2022. No abnormal enhancement to suggest active demyelination. 2. Multilevel degenerative changes throughout the cervical spine as above, stable since the prior study and most notable at C5-C6 where there is an inferiorly migrated disc extrusion resulting in mild-to-moderate spinal canal stenosis and moderate left and mild right neural foraminal stenosis. 3. Peripherally T1 hyperintense lesion in the T2 vertebral body is unchanged and favored benign. Recommend continued attention on follow-up studies. Electronically Signed   By: Lesia Hausen M.D.   On: 09/04/2022 18:22   MR BRAIN W WO CONTRAST  Result Date: 09/04/2022 CLINICAL DATA:  Multiple sclerosis, evaluate for acute flare. EXAM: MRI HEAD WITHOUT AND WITH CONTRAST TECHNIQUE: Multiplanar, multiecho pulse sequences of the brain and surrounding structures were obtained without and with intravenous contrast. CONTRAST:  71mL GADAVIST GADOBUTROL 1 MMOL/ML IV SOLN COMPARISON:  Same-day CT head, brain MRI 01/30/2022 FINDINGS: Brain: There is no evidence of acute intracranial hemorrhage, extra-axial fluid collection, or acute infarct Background parenchymal volume loss with prominence of the ventricular system and extra-axial CSF spaces is unchanged. The ventricles are stable in size compared to the prior brain MRI. Again seen is extensive white matter signal abnormality throughout the supratentorial brain with numerous areas of T1 hypointensity. Findings may reflect sequela of advanced chronic small-vessel ischemic change and/or multiple sclerosis with T1 hypointensity reflecting remote infarcts versus axonal loss related to demyelinating disease. There is no abnormal enhancement to suggest active demyelination. The pituitary and suprasellar region are normal. There is no mass lesion or abnormal  enhancement. There is no mass effect or midline shift. Vascular: Normal flow voids. Skull and upper cervical spine: Normal marrow signal. Sinuses/Orbits: There is mild mucosal thickening in the paranasal sinuses. The globes and orbits are unremarkable. Other: None. IMPRESSION: 1. No acute intracranial pathology or significant change since the MRI from 01/29/2022. 2. Extensive white matter signal abnormality may reflect sequela of multiple sclerosis and/or advanced chronic small-vessel ischemic change. No abnormal enhancement to suggest active demyelination. Electronically Signed   By: Lesia Hausen M.D.   On: 09/04/2022 18:11       LOS: 2 days   Dustee Bottenfield Rito Ehrlich  [facility] Pager on [email or web address]  09/06/2022, 9:47 AM

## 2022-09-06 NOTE — Progress Notes (Addendum)
Medications at bedside: Potassium cl 20 mEq Finasteride 5 mg Vitamin D3 5000 IU Amlodipine besylate 10 mg Metformin hcl 500 mg Lisinopril 40 mg Mirtazapine 15 mg filled 07/01/2022 An additional bottle of mirtazapine 15 mg filled 08/08/2022 Tamsulosin hcl 0.4 mg Polyethylene glycol 3350 Allopurinol 300 mg atorvastatin calcium 80 mgtumeric xl hydrochlorothiazide 25 mg  Cyclobenzaprine hcl 10 mg 1 tablet in bottle  Pt stated his daughter is coming by to pick up these medications.  Two other home medications have been taken to pharmacy to be dose administered during admission. Form completed with those medications and copy in chart.

## 2022-09-06 NOTE — Plan of Care (Signed)

## 2022-09-07 DIAGNOSIS — R531 Weakness: Secondary | ICD-10-CM | POA: Diagnosis not present

## 2022-09-07 LAB — CULTURE, BLOOD (ROUTINE X 2)

## 2022-09-07 MED ORDER — MIRTAZAPINE 15 MG PO TABS
15.0000 mg | ORAL_TABLET | Freq: Every day | ORAL | Status: DC
  Administered 2022-09-07 – 2022-09-08 (×2): 15 mg via ORAL
  Filled 2022-09-07 (×2): qty 1

## 2022-09-07 NOTE — Progress Notes (Signed)
Inpatient Rehab Admissions Coordinator:    I spoke with Pt. To discuss potential CIR admit. He is interested, states he lives alone with intermittent support from daughter and friends. Will need Modified independent goals to return home safely. I will discuss whether this is a reasonable expectation with rehab MD tomorrow but [facility].   Megan Salon, MS, CCC-SLP Rehab Admissions Coordinator  [phone] (celll) [phone] (office)

## 2022-09-07 NOTE — Progress Notes (Signed)
Mobility Specialist - Progress Note   09/07/22 1106  Mobility  Activity Ambulated with assistance in hallway  Level of Assistance Minimal assist, patient does 75% or more  Assistive Device Front wheel walker  Distance Ambulated (ft) 120 ft  Activity Response Tolerated well  Mobility Referral Yes  $Mobility charge 1 Mobility   Pt received in bed and agreeable to mobility. Pt was MinA from sit>stand & contact during ambulation due to his feet turning inward. No complaints during session. Nurse made aware of session. Pt to recliner after session with all needs met. Chair alarm on.   [facility]

## 2022-09-07 NOTE — Discharge Summary (Signed)
[facility]  Physician Discharge Summary   Patient ID: James Gibbs MRN: [id number] DOB/AGE: 01-May-1956 67 y.o.  Admit date: 09/03/2022 Discharge date:   09/07/2022   PCP: Greta Doom, MD  DISCHARGE DIAGNOSES:  Rhinovirus infection Multiple sclerosis Essential hypertension Generalized weakness, improving History of BPH Hyperlipidemia History of gout    RECOMMENDATIONS FOR OUTPATIENT FOLLOW UP: Please check CBC and basic metabolic panel in 1 week   Home Health: Possible discharge to inpatient rehabilitation Equipment/Devices: None  CODE STATUS: Full code  DISCHARGE CONDITION: fair  Diet recommendation: Heart healthy  INITIAL HISTORY: 67 y.o. male with medical history significant for MS with right lower extremity weakness at baseline, hypertension, hyperlipidemia, type 2 diabetes, BPH, GERD, gout, who presented to the [facility] ED from home due to sudden onset generalized weakness.  Patient went to [facility] on the day of admission to have dental implants.  He was feeling well afterwards and returned home.  His daughter brought him a meal from [employer] which he ate then took a nap around 6:30 PM.  When he woke up around 7 PM he could not move his left leg.  States his right foot is always weak.  EMS was activated and the patient was brought into the ED for further evaluation.  In the ED he was noted to be febrile.  Hospitalized for further management.    HOSPITAL COURSE:   Generalized weakness Generalized weakness triggered by rhinovirus infection.  Seems to be improving.  Seen by physical therapy.  Inpatient rehabilitation is recommended.      Rhinovirus respiratory infection He did have a temperature of 102 F in the emergency department.  No obvious source of infection was initially identified.  UA unremarkable.  Chest x-ray does not show any pneumonia.  He does not have any GI symptoms.  He did have dental procedure recently.   He did mention cough.   COVID-19 influenza and RSV PCR's were negative.  Respiratory viral panel positive for rhinovirus which explains his fever.  Cultures negative so far.  Procalcitonin was unremarkable.  Broad-spectrum antibiotics were discontinued. Remains stable from a respiratory standpoint.   Lactic acidosis Improved.   History of MS Followed at the [place]. He is noted to be on dalfampridine as well as dimethyl fumarate.  These are being continued. Concern for MS exacerbation due to his lower extremity weakness.  Patient underwent MRI brain and cervical spine which shows only chronic changes without any acute findings.  This is reassuring.   Seen by PT and OT.  Inpatient rehabilitation is recommended.   QT prolongation Avoid QT prolonging medications.    Hypokalemia Repleted   Essential hypertension Resume home medications   History of BPH Continue Flomax and Proscar.   Hyperlipidemia Continue statin.   GERD and hiatal hernia Continue PPI   History of gout Continue allopurinol   History of obstructive sleep apnea Continue CPAP.   Obesity Estimated body mass index is 31.52 kg/m as calculated from the following:   Height as of this encounter: 5\' 11"  (1.803 m).   Weight as of this encounter: 102.5 kg.   Patient is stable.  Okay for discharge to inpatient rehabilitation when bed is available.   PERTINENT LABS:  The results of significant diagnostics from this hospitalization (including imaging, microbiology, ancillary and laboratory) are listed below for reference.    Microbiology: Recent Results (from the past 240 hour(s))  Resp panel by RT-PCR (RSV, Flu A&B, Covid) Anterior Nasal Swab  Status: None   Collection Time: 09/03/22 11:04 PM   Specimen: Anterior Nasal Swab  Result Value Ref Range Status   SARS Coronavirus 2 by RT PCR NEGATIVE NEGATIVE Final    Comment: (NOTE) SARS-CoV-2 target nucleic acids are NOT DETECTED.  The SARS-CoV-2 RNA is generally detectable in upper  respiratory specimens during the acute phase of infection. The lowest concentration of SARS-CoV-2 viral copies this assay can detect is 138 copies/mL. A negative result does not preclude SARS-Cov-2 infection and should not be used as the sole basis for treatment or other patient management decisions. A negative result may occur with  improper specimen collection/handling, submission of specimen other than nasopharyngeal swab, presence of viral mutation(s) within the areas targeted by this assay, and inadequate number of viral copies(<138 copies/mL). A negative result must be combined with clinical observations, patient history, and epidemiological information. The expected result is Negative.  Fact Sheet for Patients:  BloggerCourse.com  Fact Sheet for Healthcare Providers:  SeriousBroker.it  This test is no t yet approved or cleared by the [place] FDA and  has been authorized for detection and/or diagnosis of SARS-CoV-2 by FDA under an Emergency Use Authorization (EUA). This EUA will remain  in effect (meaning this test can be used) for the duration of the COVID-19 declaration under Section 564(b)(1) of the Act, 21 U.S.C.section 360bbb-3(b)(1), unless the authorization is terminated  or revoked sooner.       Influenza A by PCR NEGATIVE NEGATIVE Final   Influenza B by PCR NEGATIVE NEGATIVE Final    Comment: (NOTE) The Xpert Xpress SARS-CoV-2/FLU/RSV plus assay is intended as an aid in the diagnosis of influenza from Nasopharyngeal swab specimens and should not be used as a sole basis for treatment. Nasal washings and aspirates are unacceptable for Xpert Xpress SARS-CoV-2/FLU/RSV testing.  Fact Sheet for Patients: BloggerCourse.com  Fact Sheet for Healthcare Providers: SeriousBroker.it  This test is not yet approved or cleared by the [place] FDA and has been  authorized for detection and/or diagnosis of SARS-CoV-2 by FDA under an Emergency Use Authorization (EUA). This EUA will remain in effect (meaning this test can be used) for the duration of the COVID-19 declaration under Section 564(b)(1) of the Act, 21 U.S.C. section 360bbb-3(b)(1), unless the authorization is terminated or revoked.     Resp Syncytial Virus by PCR NEGATIVE NEGATIVE Final    Comment: (NOTE) Fact Sheet for Patients: BloggerCourse.com  Fact Sheet for Healthcare Providers: SeriousBroker.it  This test is not yet approved or cleared by the [place] FDA and has been authorized for detection and/or diagnosis of SARS-CoV-2 by FDA under an Emergency Use Authorization (EUA). This EUA will remain in effect (meaning this test can be used) for the duration of the COVID-19 declaration under Section 564(b)(1) of the Act, 21 U.S.C. section 360bbb-3(b)(1), unless the authorization is terminated or revoked.  Performed at [facility], [address] [place], [place] [address]   Blood Culture (routine x 2)     Status: None (Preliminary result)   Collection Time: 09/03/22 11:07 PM   Specimen: BLOOD  Result Value Ref Range Status   Specimen Description   Final    BLOOD BLOOD RIGHT HAND Performed at [facility], [address], [place], [place] [address]    Special Requests   Final    BOTTLES DRAWN AEROBIC ONLY Blood Culture results may not be optimal due to an inadequate volume of blood received in culture bottles Performed at [facility], [address], [place], [place] [address]    Culture   Final    NO GROWTH 3 DAYS Performed at [facility], [address], [place], [place] [address]    Report Status PENDING  Incomplete  Blood Culture (routine x 2)     Status: None (Preliminary result)   Collection Time: 09/03/22 11:09 PM   Specimen: BLOOD  Result  Value Ref Range Status   Specimen Description   Final    BLOOD LEFT ANTECUBITAL Performed at [facility], [address] [place], [place] [address]    Special Requests   Final    BOTTLES DRAWN AEROBIC AND ANAEROBIC Blood Culture adequate volume Performed at [facility], [address], [place], [place] [address]    Culture   Final    NO GROWTH 3 DAYS Performed at [facility], [address] [place], [place] [address]    Report Status PENDING  Incomplete  Respiratory (~20 pathogens) panel by PCR     Status: Abnormal   Collection Time: 09/04/22  5:44 PM   Specimen: Nasopharyngeal Swab; Respiratory  Result Value Ref Range Status   Adenovirus NOT DETECTED NOT DETECTED Final   Coronavirus 229E NOT DETECTED NOT DETECTED Final    Comment: (NOTE) The Coronavirus on the Respiratory Panel, DOES NOT test for the novel  Coronavirus (2019 nCoV)    Coronavirus HKU1 NOT DETECTED NOT DETECTED Final   Coronavirus NL63 NOT DETECTED NOT DETECTED Final   Coronavirus OC43 NOT DETECTED NOT DETECTED Final   Metapneumovirus NOT DETECTED NOT DETECTED Final   Rhinovirus / Enterovirus DETECTED (A) NOT DETECTED Final   Influenza A NOT DETECTED NOT DETECTED Final   Influenza B NOT DETECTED NOT DETECTED Final   Parainfluenza Virus 1 NOT DETECTED NOT DETECTED Final   Parainfluenza Virus 2 NOT DETECTED NOT DETECTED Final   Parainfluenza Virus 3 NOT DETECTED NOT DETECTED Final   Parainfluenza Virus 4 NOT DETECTED NOT DETECTED Final   Respiratory Syncytial Virus NOT DETECTED NOT DETECTED Final   Bordetella pertussis NOT DETECTED NOT DETECTED Final   Bordetella Parapertussis NOT DETECTED NOT DETECTED Final   Chlamydophila pneumoniae NOT DETECTED NOT DETECTED Final   Mycoplasma pneumoniae NOT DETECTED NOT DETECTED Final    Comment: Performed at [facility], [address], [place], [place] [address]  MRSA Next Gen by PCR, Nasal     Status: None    Collection Time: 09/04/22  6:27 PM   Specimen: Nasal Mucosa; Nasal Swab  Result Value Ref Range Status   MRSA by PCR Next Gen NOT DETECTED NOT DETECTED Final    Comment: (NOTE) The GeneXpert MRSA Assay (FDA approved for NASAL specimens only), is one component of a comprehensive MRSA colonization surveillance program. It is not intended to diagnose MRSA infection nor to guide or monitor treatment for MRSA infections. Test performance is not FDA approved in patients less than 58 years old. Performed at [facility], [address] [place], [place] [address]      Labs:   Basic Metabolic Panel: Recent Labs  Lab 09/03/22 2309 09/04/22 0815 09/05/22 0521 09/06/22 0438  NA 140 139 139 139  K 3.6 3.3* 3.3* 3.6  CL 104 108 104 104  CO2 29 23 25 26   GLUCOSE 105* 122* 100* 175*  BUN 11 9 10 13   CREATININE 1.10 0.95 0.97 0.97  CALCIUM 8.9 8.1* 8.3* 8.6*  MG  --  1.9  --   --  PHOS  --  3.2  --   --    Liver Function Tests: Recent Labs  Lab 09/03/22 2309 09/04/22 0815  AST 42* 42*  ALT 65* 58*  ALKPHOS 105 86  BILITOT 1.2 0.6  PROT 7.4 6.2*  ALBUMIN 4.4 3.6    CBC: Recent Labs  Lab 09/03/22 2309 09/04/22 0815 09/05/22 0521  WBC 6.8 6.4 4.9  NEUTROABS 5.8 4.9  --   HGB 14.4 13.0 13.8  HCT 42.4 39.6 42.0  MCV 95.7 97.8 97.9  PLT 155 151 137*    CBG: Recent Labs  Lab 09/05/22 0821 09/05/22 1119  GLUCAP 129* 133*     IMAGING STUDIES MR CERVICAL SPINE W WO CONTRAST  Result Date: 09/04/2022 CLINICAL DATA:  History of multiple sclerosis with lower extremity weakness. EXAM: MRI CERVICAL SPINE WITHOUT AND WITH CONTRAST TECHNIQUE: Multiplanar and multiecho pulse sequences of the cervical spine, to include the craniocervical junction and cervicothoracic junction, were obtained without and with intravenous contrast. CONTRAST:  2mL GADAVIST GADOBUTROL 1 MMOL/ML IV SOLN COMPARISON:  Cervical spine MRI 01/30/2022. FINDINGS: Alignment: There is  straightening of the normal cervical lordosis. There is no antero or retrolisthesis. Vertebrae: Vertebral body heights are preserved. Background marrow signal is normal. There is multilevel degenerative endplate signal abnormality without edema. A peripherally T1 hyperintense lesion in the T2 vertebral body is unchanged and favored benign. There is no other focal marrow signal abnormality, marrow edema, or enhancement. Cord: There is T2 signal abnormality in the left aspect of the cord at C2-C3 (8-17), right aspect of the cord at C3-C4 (8-20), and in the right and left aspect of the cord at C5-C6 (8-31, 8-32). Findings are not significantly changed compared to the prior MRI and are consistent with provided history of demyelinating disease. There is no new, progressive, or enhancing cord signal abnormality to suggest active demyelination. Posterior Fossa, vertebral arteries, paraspinal tissues: The posterior fossa is assessed on the separately dictated brain MRI. The vertebral artery flow voids are normal. The paraspinal soft tissues are unremarkable. Disc levels: C2-C3: There is a shallow disc protrusion and mild left worse than right facet arthropathy without significant spinal canal or neural foraminal stenosis, unchanged. C3-C4: There is prominent left worse than right uncovertebral ridging with a shallow posterior disc osteophyte complex and mild left worse than right facet arthropathy resulting in mild-to-moderate left and no significant right neural foraminal stenosis and no significant spinal canal stenosis, unchanged. C4-C5: There is left worse than right uncovertebral ridging and mild facet arthropathy resulting in moderate left and mild right neural foraminal stenosis without significant spinal canal stenosis, unchanged C5-C6: There is an inferiorly migrated disc extrusion, bilateral uncovertebral ridging, and mild facet arthropathy resulting in mild-to-moderate spinal canal stenosis with slight flattening  of the ventral cord surface, and moderate left and mild right neural foraminal stenosis, unchanged C6-C7: There is left worse than right uncovertebral ridging and mild facet arthropathy resulting in mild left and no significant right neural foraminal stenosis and no significant spinal canal stenosis, unchanged. C7-T1: Mild uncovertebral and facet arthropathy resulting in mild right worse than left neural foraminal stenosis and no significant spinal canal stenosis, unchanged T1-T2: Only imaged in the sagittal plane. There is right-sided endplate spurring and right foraminal/extraforaminal disc protrusion without significant spinal canal or neural foraminal stenosis. IMPRESSION: 1. Patchy signal abnormality in the cervical cord as detailed above consistent with provided history of multiple sclerosis is unchanged since the prior MRI from 01/29/2022. No abnormal enhancement to suggest active  demyelination. 2. Multilevel degenerative changes throughout the cervical spine as above, stable since the prior study and most notable at C5-C6 where there is an inferiorly migrated disc extrusion resulting in mild-to-moderate spinal canal stenosis and moderate left and mild right neural foraminal stenosis. 3. Peripherally T1 hyperintense lesion in the T2 vertebral body is unchanged and favored benign. Recommend continued attention on follow-up studies. Electronically Signed   By: Lesia Hausen M.D.   On: 09/04/2022 18:22   MR BRAIN W WO CONTRAST  Result Date: 09/04/2022 CLINICAL DATA:  Multiple sclerosis, evaluate for acute flare. EXAM: MRI HEAD WITHOUT AND WITH CONTRAST TECHNIQUE: Multiplanar, multiecho pulse sequences of the brain and surrounding structures were obtained without and with intravenous contrast. CONTRAST:  10mL GADAVIST GADOBUTROL 1 MMOL/ML IV SOLN COMPARISON:  Same-day CT head, brain MRI 01/30/2022 FINDINGS: Brain: There is no evidence of acute intracranial hemorrhage, extra-axial fluid collection, or acute  infarct Background parenchymal volume loss with prominence of the ventricular system and extra-axial CSF spaces is unchanged. The ventricles are stable in size compared to the prior brain MRI. Again seen is extensive white matter signal abnormality throughout the supratentorial brain with numerous areas of T1 hypointensity. Findings may reflect sequela of advanced chronic small-vessel ischemic change and/or multiple sclerosis with T1 hypointensity reflecting remote infarcts versus axonal loss related to demyelinating disease. There is no abnormal enhancement to suggest active demyelination. The pituitary and suprasellar region are normal. There is no mass lesion or abnormal enhancement. There is no mass effect or midline shift. Vascular: Normal flow voids. Skull and upper cervical spine: Normal marrow signal. Sinuses/Orbits: There is mild mucosal thickening in the paranasal sinuses. The globes and orbits are unremarkable. Other: None. IMPRESSION: 1. No acute intracranial pathology or significant change since the MRI from 01/29/2022. 2. Extensive white matter signal abnormality may reflect sequela of multiple sclerosis and/or advanced chronic small-vessel ischemic change. No abnormal enhancement to suggest active demyelination. Electronically Signed   By: Lesia Hausen M.D.   On: 09/04/2022 18:11   CT Head Wo Contrast  Result Date: 09/04/2022 CLINICAL DATA:  Lethargy. EXAM: CT HEAD WITHOUT CONTRAST TECHNIQUE: Contiguous axial images were obtained from the base of the skull through the vertex without intravenous contrast. RADIATION DOSE REDUCTION: This exam was performed according to the departmental dose-optimization program which includes automated exposure control, adjustment of the mA and/or kV according to patient size and/or use of iterative reconstruction technique. COMPARISON:  December 16, 2018 FINDINGS: Brain: There is moderate cerebral atrophy with widening of the extra-axial spaces and ventricular  dilatation. There are areas of decreased attenuation within the white matter tracts of the supratentorial brain, consistent with microvascular disease changes. Vascular: No hyperdense vessel or unexpected calcification. Skull: Normal. Negative for fracture or focal lesion. Sinuses/Orbits: A 15 mm x 11 mm right maxillary sinus polyp versus mucous retention cyst is seen. Other: None. IMPRESSION: 1. No acute intracranial abnormality. 2. Generalized cerebral atrophy with chronic white matter small vessel ischemic changes. Electronically Signed   By: Aram Candela M.D.   On: 09/04/2022 02:00   DG Chest Port 1 View  Result Date: 09/03/2022 CLINICAL DATA:  Weakness. EXAM: PORTABLE CHEST 1 VIEW COMPARISON:  January 29, 2022 FINDINGS: The heart size and mediastinal contours are within normal limits. Both lungs are clear. The visualized skeletal structures are unremarkable. IMPRESSION: No active disease. Electronically Signed   By: Aram Candela M.D.   On: 09/03/2022 23:35    DISCHARGE EXAMINATION: Vitals:   09/06/22 0435 09/06/22 1332  09/06/22 2205 09/07/22 0610  BP: (!) 146/81 138/86 139/87 138/76  Pulse: 78 74 68 72  Resp: Temp: 97.9 F (36.6 C) 98 F (36.7 C) 98.3 F (36.8 C) 98.7 F (37.1 C)  TempSrc:  Oral Oral Oral  SpO2: 99% 99% 99% 98%  Weight:      Height:       General appearance: Awake alert.  In no distress Resp: Clear to auscultation bilaterally.  Normal effort Cardio: S1-S2 is normal regular.  No S3-S4.  No rubs murmurs or bruit GI: Abdomen is soft.  Nontender nondistended.  Bowel sounds are present normal.  No masses organomegaly Extremities: Improved mobility of the bilateral lower extremities.  He does have chronic right lower extremity weakness.   DISPOSITION: Inpatient rehabilitation  Discharge Instructions     Call MD for:  difficulty breathing, headache or visual disturbances   Complete by: As directed    Call MD for:  extreme fatigue   Complete  by: As directed    Call MD for:  persistant dizziness or light-headedness   Complete by: As directed    Call MD for:  persistant nausea and vomiting   Complete by: As directed    Call MD for:  severe uncontrolled pain   Complete by: As directed    Call MD for:  temperature >100.4   Complete by: As directed    Diet - low sodium heart healthy   Complete by: As directed    Discharge instructions   Complete by: As directed    Please review instructions on the discharge summary.  You were cared for by a hospitalist during your hospital stay. If you have any questions about your discharge medications or the care you received while you were in the hospital after you are discharged, you can call the unit and asked to speak with the hospitalist on call if the hospitalist that took care of you is not available. Once you are discharged, your primary care physician will handle any further medical issues. Please note that NO REFILLS for any discharge medications will be authorized once you are discharged, as it is imperative that you return to your primary care physician (or establish a relationship with a primary care physician if you do not have one) for your aftercare needs so that they can reassess your need for medications and monitor your lab values. If you do not have a primary care physician, you can call [phone] for a physician referral.   Increase activity slowly   Complete by: As directed           Allergies as of 09/07/2022   No Known Allergies      Medication List     TAKE these medications    acetaminophen 650 MG CR tablet Commonly known as: TYLENOL Take 650 mg by mouth every 8 (eight) hours as needed for pain.   allopurinol 300 MG tablet Commonly known as: ZYLOPRIM Take 300 mg by mouth daily.   amLODipine 10 MG tablet Commonly known as: NORVASC Take 10 mg by mouth daily.   atorvastatin 80 MG tablet Commonly known as: LIPITOR Take 80 mg by mouth at bedtime.    cholecalciferol 10 MCG (400 UNIT) Tabs tablet Commonly known as: VITAMIN D3 Take 400 Units by mouth daily.   dalfampridine 10 MG Tb12 Take 1 tablet by mouth every 12 (twelve) hours.   finasteride 5 MG tablet Commonly known as: PROSCAR Take 5 mg by mouth daily.  Fish Oil 1000 MG Caps Take 1,000 mg by mouth daily.   lidocaine 5 % Commonly known as: LIDODERM Place 1 patch onto the skin daily. Remove & Discard patch within 12 hours or as directed by MD   lisinopril 40 MG tablet Commonly known as: ZESTRIL Take 40 mg by mouth daily.   metFORMIN 500 MG 24 hr tablet Commonly known as: GLUCOPHAGE-XR Take 500 mg by mouth daily.   MICROZIDE PO Take 25 mg by mouth daily.   mirtazapine 15 MG tablet Commonly known as: REMERON Take 15 mg by mouth at bedtime. May repeat with a quarter of a tablet if you wake up - up to twice a night)   multivitamin with minerals Tabs tablet Take 1 tablet by mouth daily.   pantoprazole 40 MG tablet Commonly known as: Protonix Take 1 tablet (40 mg total) by mouth daily. What changed: when to take this   polyethylene glycol 17 g packet Commonly known as: MIRALAX / GLYCOLAX Take 17 g by mouth 2 (two) times daily. What changed: when to take this   potassium chloride SA 20 MEQ tablet Commonly known as: KLOR-CON M Take 20 mEq by mouth daily.   senna-docusate 8.6-50 MG tablet Commonly known as: Senokot-S Take 1 tablet by mouth 2 (two) times daily. What changed: how much to take   tamsulosin 0.4 MG Caps capsule Commonly known as: FLOMAX Take 0.8 mg by mouth daily.   Tecfidera 240 MG Cpdr Generic drug: Dimethyl Fumarate Take 240 mg by mouth 2 (two) times daily.   vitamin C 100 MG tablet Take 500 mg by mouth in the morning and at bedtime.          Follow-up Information     Tapp, Karen Chafe, MD. Schedule an appointment as soon as possible for a visit in 1 week(s).   Specialty: Internal Medicine Why: post hospitalization follow  up Contact information: [address] [place] [address] [phone]                 TOTAL DISCHARGE TIME: 35 minutes  Daichi Moris Rito Ehrlich  [facility] Pager on [email or web address]  09/07/2022, 10:40 AM

## 2022-09-07 NOTE — Progress Notes (Signed)
Mobility Specialist - Progress Note   09/07/22 1413  Mobility  Activity Ambulated with assistance in hallway  Level of Assistance Standby assist, set-up cues, supervision of patient - no hands on  Assistive Device Front wheel walker  Distance Ambulated (ft) 120 ft  Activity Response Tolerated well  Mobility Referral Yes  $Mobility charge 1 Mobility   Pt received in recliner and agreeable to mobility. Pt was MinA from sit>stand. No complaints during session. Pt to bed after session with all needs met.    [facility]

## 2022-09-07 NOTE — PMR Pre-admission (Signed)
PMR Admission Coordinator Pre-Admission Assessment  Patient: James Gibbs is an 67 y.o., male MRN: [id number] DOB: April 27, 1956 Height: 5\' 11"  (180.3 cm) Weight: 102.5 kg  Insurance Information HMO: ***    PPO: ***     PCP: ***     IPA: ***     80/20: ***     OTHER: *** PRIMARY: [facility]      Policy#:  [id number]         Subscriber: *** CM Name: ***      Phone#: ***     Fax#: *** Pre-Cert#: ***      Employer: *** Benefits:  Phone #: ***     Name: *** Dolores Hoose. Date: ***     Deduct: ***      Out of Pocket Max: ***      Life Max: *** CIR: ***      SNF: *** Outpatient: ***     Co-Pay: *** Home Health: ***      Co-Pay: *** DME: ***     Co-Pay: *** Providers: *** SECONDARY: [employer]      Policy#: [id number]      Phone#:   Financial Counselor:       Phone#:   The "Data Collection Information Summary" for patients in Inpatient Rehabilitation Facilities with attached "Privacy Act Statement-Health Care Records" was provided and verbally reviewed with: Patient  Emergency Contact Information Contact Information     Name Relation Home Work Mobile   James Gibbs Daughter [phone]         Current Medical History  Patient Admitting Diagnosis: MS Pseudo-exacerbation History of Present Illness: James Gibbs is a 67 y.o. 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  The patient was in his usual state of health, the  morning PTA and  he went to the [facility] to have dental implants.  He was feeling well afterwards and returned home. Around 7 PM he could not move his left leg.  States his right foot is always weak.  He could not lift his body off the bed.  States he had no strength and could not walk.  EMS was activated and the patient was brought into  the ED for further evaluation. In the ED, febrile with Tmax 102.2, tachycardic with heart rate of 101, tachypneic with respiration rate of 29.  Code sepsis was called in the ED.  Blood cultures were obtained.  The patient was started on broad-spectrum IV antibiotics and IV fluid per sepsis protocol. COVID-19 screening test, RSV, influenza A&B were negative. Respiratory panel was positive for rhinovirus. UA, non contrast CT head, and chest x-ray were nonacute.Patient underwent MRI brain and cervical spine which shows only chronic changes without any acute findings. Pt. Felt to be experiencing weakness due to rhinovirus in the contest of his MS diagnosis. Pt. Was seen by PT/OT and they recommended CIR to assist return to PLOF.    Patient's medical record from [facility] has been reviewed by the rehabilitation admission coordinator and physician.  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 No  Family History   family history includes Hypertension in his father and mother.  Current Medications  Current Facility-Administered Medications:    acetaminophen (TYLENOL) tablet 650 mg, 650 mg, Oral, Q6H PRN, Darlin Drop, DO, 650 mg at 09/04/22 2118   allopurinol (ZYLOPRIM) tablet 300 mg, 300 mg, Oral, Daily, Hall, Carole N, DO, 300 mg at 09/07/22 1008   amLODipine (NORVASC) tablet 10 mg, 10 mg, Oral, Daily, Hall, Carole N, DO, 10 mg at 09/07/22 1008   ascorbic acid (VITAMIN C) tablet 500 mg, 500 mg, Oral, Daily, Hall, Carole N, DO, 500 mg at 09/07/22 1008   atorvastatin (LIPITOR) tablet 80 mg, 80 mg, Oral, QHS, Hall, Carole N, DO, 80 mg at 09/06/22 2142   cholecalciferol (VITAMIN D3) 10 MCG (400 UNIT) tablet 400 Units, 400 Units, Oral, Daily,  Darlin Drop, DO, 400 Units at 09/07/22 1008   dalfampridine TB12 10 mg, 10 mg, Oral, Q12H, Hall, Carole N, DO, 10 mg at 09/07/22 1129   Dimethyl Fumarate CPDR 240 mg, 240 mg, Oral, BID, Osvaldo Shipper, MD, 240 mg at 09/07/22 1129   finasteride (PROSCAR) tablet 5 mg, 5 mg, Oral, Daily, Hall, Carole N, DO, 5 mg at 09/07/22 1008   mirtazapine (REMERON) tablet 15 mg, 15 mg, Oral, QHS, Osvaldo Shipper, MD   pantoprazole (PROTONIX) EC tablet 40 mg, 40 mg, Oral, Daily, Hall, Carole N, DO, 40 mg at 09/07/22 1008   phenol (CHLORASEPTIC) mouth spray 1 spray, 1 spray, Mouth/Throat, PRN, Osvaldo Shipper, MD   polyethylene glycol (MIRALAX / GLYCOLAX) packet 17 g, 17 g, Oral, Daily PRN, Dow Adolph N, DO, 17 g at 09/07/22 1030   polyvinyl alcohol (LIQUIFILM TEARS) 1.4 % ophthalmic solution 1 drop, 1 drop, Both Eyes, QID, Hall, Carole N, DO, 1 drop at 09/05/22 2145   prochlorperazine (COMPAZINE) injection 5 mg, 5 mg, Intravenous, Q6H PRN, Margo Aye, Carole N, DO   tamsulosin (FLOMAX) capsule 0.8 mg, 0.8 mg, Oral, Daily, Hall, Carole N, DO, 0.8 mg at 09/07/22 1008  Patients Current Diet:  Diet Order             Diet - low sodium heart healthy           Diet Heart Room service appropriate? Yes; Fluid consistency: Thin  Diet effective now                   Precautions / Restrictions Precautions Precautions: Fall Precaution Comments: chronic R sided weakness Restrictions Weight Bearing Restrictions: No   Has the patient had 2 or more falls or a fall with injury in the past year? Yes  Prior Activity Level Community (5-7x/wk): Pt was active in the community PTA  Prior Functional Level Self Care: Did the patient need help bathing, dressing, using the toilet or eating? Independent  Indoor Mobility: Did the patient need assistance with walking from room to room (with or without device)? Independent  Stairs: Did the patient need assistance with internal or external stairs (with or without device)?  Independent  Functional Cognition: Did the patient need help planning regular tasks such as shopping or remembering to take medications? Independent  Patient Information Are you of Hispanic, Latino/a,or Spanish origin?: A. No, not of Hispanic, Latino/a, or Spanish origin What is your race?: A. White Do you need or want an interpreter to communicate with a doctor or health care staff?: 0. No  Patient's Response To:  Health Literacy and Transportation Is the patient able to respond  to health literacy and transportation needs?: No Health Literacy - How often do you need to have someone help you when you read instructions, pamphlets, or other written material from your doctor or pharmacy?: Never In the past 12 months, has lack of transportation kept you from medical appointments or from getting medications?: No In the past 12 months, has lack of transportation kept you from meetings, work, or from getting things needed for daily living?: No  Home Assistive Devices / Equipment Home Assistive Devices/Equipment: Environmental consultant (specify type) Home Equipment: Rollator (4 wheels), BSC/3in1, Shower seat, Hand held shower head, Grab bars - tub/shower, Other (comment) (upright walker)  Prior Device Use: Indicate devices/aids used by the patient prior to current illness, exacerbation or injury? Walker  Current Functional Level Cognition  Overall Cognitive Status: No family/caregiver present to determine baseline cognitive functioning Orientation Level: Oriented X4 General Comments: slower processing, pleasant, follows directions w/ increased time.    Extremity Assessment (includes Sensation/Coordination)  Upper Extremity Assessment: RUE deficits/detail RUE Deficits / Details: baseline R UE weakness though functional  Lower Extremity Assessment: Defer to PT evaluation    ADLs  Overall ADL's : Needs assistance/impaired Eating/Feeding: Independent Grooming: Set up, Sitting, Wash/dry face Upper Body  Bathing: Minimal assistance, Sitting Upper Body Bathing Details (indicate cue type and reason): assist w/ back Lower Body Bathing: Moderate assistance, Sitting/lateral leans, Sit to/from stand, +2 for physical assistance, +2 for safety/equipment Lower Body Bathing Details (indicate cue type and reason): able to bathe reaching down to lower LE sitting EOB. assist for posterior hygiene standing with RW. Then able to assist with anterior peri care standing in [place] Upper Body Dressing : Minimal assistance, Sitting Lower Body Dressing: Maximal assistance, +2 for safety/equipment, +2 for physical assistance, Sitting/lateral leans, Sit to/from stand Toileting- Clothing Manipulation and Hygiene: Maximal assistance, +2 for physical assistance, +2 for safety/equipment, Sit to/from stand, Sitting/lateral lean General ADL Comments: Limited by chronic R weakness and new L sided weakness    Mobility  Overal bed mobility: Needs Assistance Bed Mobility: Supine to Sit Supine to sit: Min assist General bed mobility comments: assist for LE off of bed and lifting trunk    Transfers  Overall transfer level: Needs assistance Equipment used: Rolling walker (2 wheels) Transfers: Sit to/from Stand Sit to Stand: Min guard, Min assist Bed to/from chair/wheelchair/BSC transfer type:: Via Financial planner via Lift Equipment: [employer] transfer comment: Performed x 5 during session with initial min A progressing to min guard.  Requiring cues for upright posture and knees straight    Ambulation / Gait / Stairs / Wheelchair Mobility  Ambulation/Gait Ambulation/Gait assistance: Min guard, +2 safety/equipment Gait Distance (Feet): 50 Feet (50'x2) Assistive device: Rolling walker (2 wheels) Gait Pattern/deviations: Step-to pattern, Decreased stride length, Trunk flexed General Gait Details: Pt with gradual increase in trunk and knee flexion with gait requiring cues to correct.  Also, cues for RW proximity  particularly with turns.  Pt expressed typically uses rollator Gait velocity: decreased    Posture / Balance Dynamic Sitting Balance Sitting balance - Comments: R lateral drift w/ dynamic tasks Balance Overall balance assessment: Needs assistance Sitting-balance support: No upper extremity supported, Feet supported Sitting balance-Leahy Scale: Fair Sitting balance - Comments: R lateral drift w/ dynamic tasks Postural control: Right lateral lean Standing balance support: Bilateral upper extremity supported, During functional activity, Reliant on assistive device for balance Standing balance-Leahy Scale: Poor    Special needs/care consideration Skin *** and Special service needs contact/droplet precautions   Previous  Home Environment (from acute therapy documentation) Living Arrangements: Alone Available Help at Discharge: Family Type of Home: House Home Layout: One level Home Access: Stairs to enter Entrance Stairs-Rails: Can reach both Entrance Stairs-Number of Steps: 3 Bathroom Shower/Tub: Health visitor: Handicapped height Bathroom Accessibility: No Home Care Services: No  Discharge Living Setting Plans for Discharge Living Setting: Patient's home Type of Home at Discharge: House Discharge Home Layout: One level Discharge Home Access: Stairs to enter Entrance Stairs-Rails: Can reach both Entrance Stairs-Number of Steps: 3 Discharge Bathroom Shower/Tub: Walk-in shower Discharge Bathroom Toilet: Handicapped height Discharge Bathroom Accessibility: No Does the patient have any problems obtaining your medications?: No  Social/Family/Support Systems Patient Roles: Other (Comment) Contact Information: [phone] Anticipated Caregiver: Merric Ansari (daughter) Anticipated Caregiver's Contact Information: Intermittent support, Pt. needs mod I gaols Discharge Plan Discussed with Primary Caregiver: Yes Is Caregiver In Agreement with Plan?: Yes Does  Caregiver/Family have Issues with Lodging/Transportation while Pt is in Rehab?: No  Goals Patient/Family Goal for Rehab: PT/OT mod I Expected length of stay: 7-10 days Pt/Family Agrees to Admission and willing to participate: Yes Program Orientation Provided & Reviewed with Pt/Caregiver Including Roles  & Responsibilities: Yes  Decrease burden of Care through IP rehab admission: not anticipated  Possible need for SNF placement upon discharge: not anticipated  Patient Condition: I have reviewed medical records from [facility], spoken with CM, and patient. I met with patient at the bedside for inpatient rehabilitation assessment.  Patient will benefit from ongoing PT and OT, can actively participate in 3 hours of therapy a day 5 days of the week, and can make measurable gains during the admission.  Patient will also benefit from the coordinated team approach during an Inpatient Acute Rehabilitation admission.  The patient will receive intensive therapy as well as Rehabilitation physician, nursing, social worker, and care management interventions.  Due to safety, skin/wound care, disease management, medication administration, pain management, and patient education the patient requires 24 hour a day rehabilitation nursing.  The patient is currently *** with mobility and basic ADLs.  Discharge setting and therapy post discharge at home with home health is anticipated.  Patient has agreed to participate in the Acute Inpatient Rehabilitation Program and will admit {Time; today/tomorrow:10263}.  Preadmission Screen Completed By:  Jeronimo Greaves, 09/07/2022 12:47 PM ______________________________________________________________________   Discussed status with Dr. Marland Kitchen on *** at *** and received approval for admission today.  Admission Coordinator:  Jeronimo Greaves, CCC-SLP, time Marland KitchenDorna Bloom ***   Assessment/Plan: Diagnosis: Does the need for close, 24 hr/day Medical supervision in concert with the patient's rehab  needs make it unreasonable for this patient to be served in a less intensive setting? {yes_no_potentially:3041433} Co-Morbidities requiring supervision/potential complications: *** Due to {due QQ:2297989}, does the patient require 24 hr/day rehab nursing? {yes_no_potentially:3041433} Does the patient require coordinated care of a physician, rehab nurse, PT, OT, and SLP to address physical and functional deficits in the context of the above medical diagnosis(es)? {yes_no_potentially:3041433} Addressing deficits in the following areas: {deficits:3041436} Can the patient actively participate in an intensive therapy program of at least 3 hrs of therapy 5 days a week? {yes_no_potentially:3041433} The potential for patient to make measurable gains while on inpatient rehab is {potential:3041437} Anticipated functional outcomes upon discharge from inpatient rehab: {functional outcomes:304600100} PT, {functional outcomes:304600100} OT, {functional outcomes:304600100} SLP Estimated rehab length of stay to reach the above functional goals is: *** Anticipated discharge destination: {anticipated dc setting:21604} 10. Overall Rehab/Functional Prognosis: {potential:3041437}   MD Signature: ***

## 2022-09-08 LAB — CULTURE, BLOOD (ROUTINE X 2)

## 2022-09-08 MED ORDER — SENNOSIDES-DOCUSATE SODIUM 8.6-50 MG PO TABS
2.0000 | ORAL_TABLET | Freq: Every day | ORAL | Status: DC
  Administered 2022-09-08: 2 via ORAL
  Filled 2022-09-08: qty 2

## 2022-09-08 MED ORDER — FLEET ENEMA 7-19 GM/118ML RE ENEM
1.0000 | ENEMA | Freq: Once | RECTAL | Status: AC
  Administered 2022-09-08: 1 via RECTAL
  Filled 2022-09-08: qty 1

## 2022-09-08 MED ORDER — POLYETHYLENE GLYCOL 3350 17 G PO PACK
17.0000 g | PACK | Freq: Two times a day (BID) | ORAL | Status: DC
  Administered 2022-09-08 – 2022-09-09 (×3): 17 g via ORAL
  Filled 2022-09-08 (×3): qty 1

## 2022-09-08 NOTE — Progress Notes (Signed)
Inpatient Rehab Admissions Coordinator:    CIR following for potential admit. Case opened with insurance today. Awaiting auth.   Megan Salon, MS, CCC-SLP Rehab Admissions Coordinator  [phone] (celll) [phone] (office)

## 2022-09-08 NOTE — Progress Notes (Signed)
RT note: Pt. ready and placed on CPAP at this time, tolerating well, made aware to notify if needed.

## 2022-09-08 NOTE — Discharge Summary (Signed)
[facility]  Physician Discharge Summary   Patient ID: James Gibbs MRN: [id number] DOB/AGE: [birth year] 67 y.o.  Admit date: 09/03/2022 Discharge date:   09/08/2022   PCP: Greta Doom, MD  DISCHARGE DIAGNOSES:  Rhinovirus infection Multiple sclerosis Essential hypertension Generalized weakness, improving History of BPH Hyperlipidemia History of gout    RECOMMENDATIONS FOR OUTPATIENT FOLLOW UP: Please check CBC and basic metabolic panel in 1 week   Home Health: Possible discharge to inpatient rehabilitation Equipment/Devices: None  CODE STATUS: Full code  DISCHARGE CONDITION: fair  Diet recommendation: Heart healthy  INITIAL HISTORY: 67 y.o. male with medical history significant for MS with right lower extremity weakness at baseline, hypertension, hyperlipidemia, type 2 diabetes, BPH, GERD, gout, who presented to the [facility] ED from home due to sudden onset generalized weakness.  Patient went to [facility] on the day of admission to have dental implants.  He was feeling well afterwards and returned home.  His daughter brought him a meal from [employer] which he ate then took a nap around 6:30 PM.  When he woke up around 7 PM he could not move his left leg.  States his right foot is always weak.  EMS was activated and the patient was brought into the ED for further evaluation.  In the ED he was noted to be febrile.  Hospitalized for further management.    HOSPITAL COURSE:   Generalized weakness Generalized weakness triggered by rhinovirus infection.  Seems to be improving.  Seen by physical therapy.  Inpatient rehabilitation is recommended.      Rhinovirus respiratory infection He did have a temperature of 102 F in the emergency department.  No obvious source of infection was initially identified.  UA unremarkable.  Chest x-ray does not show any pneumonia.  He does not have any GI symptoms.  He did have dental procedure recently.   He did mention cough.   COVID-19 influenza and RSV PCR's were negative.  Respiratory viral panel positive for rhinovirus which explains his fever.  Cultures negative so far.  Procalcitonin was unremarkable.  Broad-spectrum antibiotics were discontinued. Remains stable from a respiratory standpoint.   Lactic acidosis Improved.   History of MS Followed at the [place]. He is noted to be on dalfampridine as well as dimethyl fumarate.  These are being continued. Concern for MS exacerbation due to his lower extremity weakness.  Patient underwent MRI brain and cervical spine which shows only chronic changes without any acute findings.  This is reassuring.   Seen by PT and OT.  Inpatient rehabilitation is recommended.  Constipation Complains of constipation over the past several days.  Will be given MiraLAX Senokot.  Will be given Fleet enema today.   QT prolongation Avoid QT prolonging medications.    Hypokalemia Repleted   Essential hypertension Resume home medications   History of BPH Continue Flomax and Proscar.   Hyperlipidemia Continue statin.   GERD and hiatal hernia Continue PPI   History of gout Continue allopurinol   History of obstructive sleep apnea Continue CPAP.   Obesity Estimated body mass index is 31.52 kg/m as calculated from the following:   Height as of this encounter: 5\' 11"  (1.803 m).   Weight as of this encounter: 102.5 kg.   Patient is stable.  Okay for discharge to inpatient rehabilitation when bed is available.   PERTINENT LABS:  The results of significant diagnostics from this hospitalization (including imaging, microbiology, ancillary and laboratory) are listed below for reference.    Microbiology:  Recent Results (from the past 240 hour(s))  Resp panel by RT-PCR (RSV, Flu A&B, Covid) Anterior Nasal Swab     Status: None   Collection Time: 09/03/22 11:04 PM   Specimen: Anterior Nasal Swab  Result Value Ref Range Status   SARS Coronavirus 2 by RT PCR NEGATIVE NEGATIVE  Final    Comment: (NOTE) SARS-CoV-2 target nucleic acids are NOT DETECTED.  The SARS-CoV-2 RNA is generally detectable in upper respiratory specimens during the acute phase of infection. The lowest concentration of SARS-CoV-2 viral copies this assay can detect is 138 copies/mL. A negative result does not preclude SARS-Cov-2 infection and should not be used as the sole basis for treatment or other patient management decisions. A negative result may occur with  improper specimen collection/handling, submission of specimen other than nasopharyngeal swab, presence of viral mutation(s) within the areas targeted by this assay, and inadequate number of viral copies(<138 copies/mL). A negative result must be combined with clinical observations, patient history, and epidemiological information. The expected result is Negative.  Fact Sheet for Patients:  BloggerCourse.com  Fact Sheet for Healthcare Providers:  SeriousBroker.it  This test is no t yet approved or cleared by the [place] FDA and  has been authorized for detection and/or diagnosis of SARS-CoV-2 by FDA under an Emergency Use Authorization (EUA). This EUA will remain  in effect (meaning this test can be used) for the duration of the COVID-19 declaration under Section 564(b)(1) of the Act, 21 U.S.C.section 360bbb-3(b)(1), unless the authorization is terminated  or revoked sooner.       Influenza A by PCR NEGATIVE NEGATIVE Final   Influenza B by PCR NEGATIVE NEGATIVE Final    Comment: (NOTE) The Xpert Xpress SARS-CoV-2/FLU/RSV plus assay is intended as an aid in the diagnosis of influenza from Nasopharyngeal swab specimens and should not be used as a sole basis for treatment. Nasal washings and aspirates are unacceptable for Xpert Xpress SARS-CoV-2/FLU/RSV testing.  Fact Sheet for Patients: BloggerCourse.com  Fact Sheet for Healthcare  Providers: SeriousBroker.it  This test is not yet approved or cleared by the [place] FDA and has been authorized for detection and/or diagnosis of SARS-CoV-2 by FDA under an Emergency Use Authorization (EUA). This EUA will remain in effect (meaning this test can be used) for the duration of the COVID-19 declaration under Section 564(b)(1) of the Act, 21 U.S.C. section 360bbb-3(b)(1), unless the authorization is terminated or revoked.     Resp Syncytial Virus by PCR NEGATIVE NEGATIVE Final    Comment: (NOTE) Fact Sheet for Patients: BloggerCourse.com  Fact Sheet for Healthcare Providers: SeriousBroker.it  This test is not yet approved or cleared by the [place] FDA and has been authorized for detection and/or diagnosis of SARS-CoV-2 by FDA under an Emergency Use Authorization (EUA). This EUA will remain in effect (meaning this test can be used) for the duration of the COVID-19 declaration under Section 564(b)(1) of the Act, 21 U.S.C. section 360bbb-3(b)(1), unless the authorization is terminated or revoked.  Performed at [facility], [address], [place], [place] [address]   Blood Culture (routine x 2)     Status: None (Preliminary result)   Collection Time: 09/03/22 11:07 PM   Specimen: BLOOD  Result Value Ref Range Status   Specimen Description   Final    BLOOD BLOOD RIGHT HAND Performed at [facility], [address], [place], [place] [address]    Special Requests   Final    BOTTLES DRAWN AEROBIC ONLY  Blood Culture results may not be optimal due to an inadequate volume of blood received in culture bottles Performed at [facility], [address], [place], [place] [address]    Culture   Final    NO GROWTH 4 DAYS Performed at [facility], [address] [place], [place] [address]    Report Status PENDING  Incomplete   Blood Culture (routine x 2)     Status: None (Preliminary result)   Collection Time: 09/03/22 11:09 PM   Specimen: BLOOD  Result Value Ref Range Status   Specimen Description   Final    BLOOD LEFT ANTECUBITAL Performed at [facility], [address] [place], [place] [address]    Special Requests   Final    BOTTLES DRAWN AEROBIC AND ANAEROBIC Blood Culture adequate volume Performed at [facility], [address], [place], [place] [address]    Culture   Final    NO GROWTH 4 DAYS Performed at [facility], [address] [place], [place] [address]    Report Status PENDING  Incomplete  Respiratory (~20 pathogens) panel by PCR     Status: Abnormal   Collection Time: 09/04/22  5:44 PM   Specimen: Nasopharyngeal Swab; Respiratory  Result Value Ref Range Status   Adenovirus NOT DETECTED NOT DETECTED Final   Coronavirus 229E NOT DETECTED NOT DETECTED Final    Comment: (NOTE) The Coronavirus on the Respiratory Panel, DOES NOT test for the novel  Coronavirus (2019 nCoV)    Coronavirus HKU1 NOT DETECTED NOT DETECTED Final   Coronavirus NL63 NOT DETECTED NOT DETECTED Final   Coronavirus OC43 NOT DETECTED NOT DETECTED Final   Metapneumovirus NOT DETECTED NOT DETECTED Final   Rhinovirus / Enterovirus DETECTED (A) NOT DETECTED Final   Influenza A NOT DETECTED NOT DETECTED Final   Influenza B NOT DETECTED NOT DETECTED Final   Parainfluenza Virus 1 NOT DETECTED NOT DETECTED Final   Parainfluenza Virus 2 NOT DETECTED NOT DETECTED Final   Parainfluenza Virus 3 NOT DETECTED NOT DETECTED Final   Parainfluenza Virus 4 NOT DETECTED NOT DETECTED Final   Respiratory Syncytial Virus NOT DETECTED NOT DETECTED Final   Bordetella pertussis NOT DETECTED NOT DETECTED Final   Bordetella Parapertussis NOT DETECTED NOT DETECTED Final   Chlamydophila pneumoniae NOT DETECTED NOT DETECTED Final   Mycoplasma pneumoniae NOT DETECTED NOT DETECTED Final    Comment:  Performed at [facility], [address] [place], [place] [address]  MRSA Next Gen by PCR, Nasal     Status: None   Collection Time: 09/04/22  6:27 PM   Specimen: Nasal Mucosa; Nasal Swab  Result Value Ref Range Status   MRSA by PCR Next Gen NOT DETECTED NOT DETECTED Final    Comment: (NOTE) The GeneXpert MRSA Assay (FDA approved for NASAL specimens only), is one component of a comprehensive MRSA colonization surveillance program. It is not intended to diagnose MRSA infection nor to guide or monitor treatment for MRSA infections. Test performance is not FDA approved in patients less than 66 years old. Performed at [facility], [address], [place], [place] [address]      Labs:   Basic Metabolic Panel: Recent Labs  Lab 09/03/22 2309 09/04/22 0815 09/05/22 0521 09/06/22 0438  NA 140 139 139 139  K 3.6 3.3* 3.3* 3.6  CL 104 108 104 104  CO2 29 23 25 26   GLUCOSE 105* 122* 100* 175*  BUN  CREATININE 1.10 0.95 0.97 0.97  CALCIUM 8.9 8.1* 8.3* 8.6*  MG  --  1.9  --   --   PHOS  --  3.2  --   --     Liver Function Tests: Recent Labs  Lab 09/03/22 2309 09/04/22 0815  AST 42* 42*  ALT 65* 58*  ALKPHOS 105 86  BILITOT 1.2 0.6  PROT 7.4 6.2*  ALBUMIN 4.4 3.6     CBC: Recent Labs  Lab 09/03/22 2309 09/04/22 0815 09/05/22 0521  WBC 6.8 6.4 4.9  NEUTROABS 5.8 4.9  --   HGB 14.4 13.0 13.8  HCT 42.4 39.6 42.0  MCV 95.7 97.8 97.9  PLT 155 151 137*     CBG: Recent Labs  Lab 09/05/22 0821 09/05/22 1119  GLUCAP 129* 133*      IMAGING STUDIES MR CERVICAL SPINE W WO CONTRAST  Result Date: 09/04/2022 CLINICAL DATA:  History of multiple sclerosis with lower extremity weakness. EXAM: MRI CERVICAL SPINE WITHOUT AND WITH CONTRAST TECHNIQUE: Multiplanar and multiecho pulse sequences of the cervical spine, to include the craniocervical junction and cervicothoracic junction, were obtained without and with intravenous  contrast. CONTRAST:  10mL GADAVIST GADOBUTROL 1 MMOL/ML IV SOLN COMPARISON:  Cervical spine MRI 01/30/2022. FINDINGS: Alignment: There is straightening of the normal cervical lordosis. There is no antero or retrolisthesis. Vertebrae: Vertebral body heights are preserved. Background marrow signal is normal. There is multilevel degenerative endplate signal abnormality without edema. A peripherally T1 hyperintense lesion in the T2 vertebral body is unchanged and favored benign. There is no other focal marrow signal abnormality, marrow edema, or enhancement. Cord: There is T2 signal abnormality in the left aspect of the cord at C2-C3 (8-17), right aspect of the cord at C3-C4 (8-20), and in the right and left aspect of the cord at C5-C6 (8-31, 8-32). Findings are not significantly changed compared to the prior MRI and are consistent with provided history of demyelinating disease. There is no new, progressive, or enhancing cord signal abnormality to suggest active demyelination. Posterior Fossa, vertebral arteries, paraspinal tissues: The posterior fossa is assessed on the separately dictated brain MRI. The vertebral artery flow voids are normal. The paraspinal soft tissues are unremarkable. Disc levels: C2-C3: There is a shallow disc protrusion and mild left worse than right facet arthropathy without significant spinal canal or neural foraminal stenosis, unchanged. C3-C4: There is prominent left worse than right uncovertebral ridging with a shallow posterior disc osteophyte complex and mild left worse than right facet arthropathy resulting in mild-to-moderate left and no significant right neural foraminal stenosis and no significant spinal canal stenosis, unchanged. C4-C5: There is left worse than right uncovertebral ridging and mild facet arthropathy resulting in moderate left and mild right neural foraminal stenosis without significant spinal canal stenosis, unchanged C5-C6: There is an inferiorly migrated disc  extrusion, bilateral uncovertebral ridging, and mild facet arthropathy resulting in mild-to-moderate spinal canal stenosis with slight flattening of the ventral cord surface, and moderate left and mild right neural foraminal stenosis, unchanged C6-C7: There is left worse than right uncovertebral ridging and mild facet arthropathy resulting in mild left and no significant right neural foraminal stenosis and no significant spinal canal stenosis, unchanged. C7-T1: Mild uncovertebral and facet arthropathy resulting in mild right worse than left neural foraminal stenosis and no significant spinal canal stenosis, unchanged T1-T2: Only imaged in the sagittal plane. There is right-sided endplate spurring and right foraminal/extraforaminal disc protrusion without significant spinal canal or neural foraminal stenosis.  IMPRESSION: 1. Patchy signal abnormality in the cervical cord as detailed above consistent with provided history of multiple sclerosis is unchanged since the prior MRI from 01/29/2022. No abnormal enhancement to suggest active demyelination. 2. Multilevel degenerative changes throughout the cervical spine as above, stable since the prior study and most notable at C5-C6 where there is an inferiorly migrated disc extrusion resulting in mild-to-moderate spinal canal stenosis and moderate left and mild right neural foraminal stenosis. 3. Peripherally T1 hyperintense lesion in the T2 vertebral body is unchanged and favored benign. Recommend continued attention on follow-up studies. Electronically Signed   By: Lesia Hausen M.D.   On: 09/04/2022 18:22   MR BRAIN W WO CONTRAST  Result Date: 09/04/2022 CLINICAL DATA:  Multiple sclerosis, evaluate for acute flare. EXAM: MRI HEAD WITHOUT AND WITH CONTRAST TECHNIQUE: Multiplanar, multiecho pulse sequences of the brain and surrounding structures were obtained without and with intravenous contrast. CONTRAST:  10mL GADAVIST GADOBUTROL 1 MMOL/ML IV SOLN COMPARISON:   Same-day CT head, brain MRI 01/30/2022 FINDINGS: Brain: There is no evidence of acute intracranial hemorrhage, extra-axial fluid collection, or acute infarct Background parenchymal volume loss with prominence of the ventricular system and extra-axial CSF spaces is unchanged. The ventricles are stable in size compared to the prior brain MRI. Again seen is extensive white matter signal abnormality throughout the supratentorial brain with numerous areas of T1 hypointensity. Findings may reflect sequela of advanced chronic small-vessel ischemic change and/or multiple sclerosis with T1 hypointensity reflecting remote infarcts versus axonal loss related to demyelinating disease. There is no abnormal enhancement to suggest active demyelination. The pituitary and suprasellar region are normal. There is no mass lesion or abnormal enhancement. There is no mass effect or midline shift. Vascular: Normal flow voids. Skull and upper cervical spine: Normal marrow signal. Sinuses/Orbits: There is mild mucosal thickening in the paranasal sinuses. The globes and orbits are unremarkable. Other: None. IMPRESSION: 1. No acute intracranial pathology or significant change since the MRI from 01/29/2022. 2. Extensive white matter signal abnormality may reflect sequela of multiple sclerosis and/or advanced chronic small-vessel ischemic change. No abnormal enhancement to suggest active demyelination. Electronically Signed   By: Lesia Hausen M.D.   On: 09/04/2022 18:11   CT Head Wo Contrast  Result Date: 09/04/2022 CLINICAL DATA:  Lethargy. EXAM: CT HEAD WITHOUT CONTRAST TECHNIQUE: Contiguous axial images were obtained from the base of the skull through the vertex without intravenous contrast. RADIATION DOSE REDUCTION: This exam was performed according to the departmental dose-optimization program which includes automated exposure control, adjustment of the mA and/or kV according to patient size and/or use of iterative reconstruction  technique. COMPARISON:  December 16, 2018 FINDINGS: Brain: There is moderate cerebral atrophy with widening of the extra-axial spaces and ventricular dilatation. There are areas of decreased attenuation within the white matter tracts of the supratentorial brain, consistent with microvascular disease changes. Vascular: No hyperdense vessel or unexpected calcification. Skull: Normal. Negative for fracture or focal lesion. Sinuses/Orbits: A 15 mm x 11 mm right maxillary sinus polyp versus mucous retention cyst is seen. Other: None. IMPRESSION: 1. No acute intracranial abnormality. 2. Generalized cerebral atrophy with chronic white matter small vessel ischemic changes. Electronically Signed   By: Aram Candela M.D.   On: 09/04/2022 02:00   DG Chest Port 1 View  Result Date: 09/03/2022 CLINICAL DATA:  Weakness. EXAM: PORTABLE CHEST 1 VIEW COMPARISON:  January 29, 2022 FINDINGS: The heart size and mediastinal contours are within normal limits. Both lungs are clear. The visualized skeletal  structures are unremarkable. IMPRESSION: No active disease. Electronically Signed   By: Aram Candela M.D.   On: 09/03/2022 23:35    DISCHARGE EXAMINATION: Vitals:   09/07/22 1314 09/07/22 1444 09/07/22 2141 09/08/22 0511  BP: (!) 142/94  136/80 137/82  Pulse: 76  70 70  Resp: 19  17 18   Temp: 99.9 F (37.7 C) 98 F (36.7 C) 97.7 F (36.5 C) 97.7 F (36.5 C)  TempSrc: Oral Oral Oral Oral  SpO2: 100%  99% 98%  Weight:      Height:       General appearance: Awake alert.  In no distress Resp: Clear to auscultation bilaterally.  Normal effort Cardio: S1-S2 is normal regular.  No S3-S4.  No rubs murmurs or bruit GI: Abdomen is soft.  Nontender nondistended.  Bowel sounds are present normal.  No masses organomegaly    DISPOSITION: Inpatient rehabilitation  Discharge Instructions     Call MD for:  difficulty breathing, headache or visual disturbances   Complete by: As directed    Call MD for:  extreme  fatigue   Complete by: As directed    Call MD for:  persistant dizziness or light-headedness   Complete by: As directed    Call MD for:  persistant nausea and vomiting   Complete by: As directed    Call MD for:  severe uncontrolled pain   Complete by: As directed    Call MD for:  temperature >100.4   Complete by: As directed    Diet - low sodium heart healthy   Complete by: As directed    Discharge instructions   Complete by: As directed    Please review instructions on the discharge summary.  You were cared for by a hospitalist during your hospital stay. If you have any questions about your discharge medications or the care you received while you were in the hospital after you are discharged, you can call the unit and asked to speak with the hospitalist on call if the hospitalist that took care of you is not available. Once you are discharged, your primary care physician will handle any further medical issues. Please note that NO REFILLS for any discharge medications will be authorized once you are discharged, as it is imperative that you return to your primary care physician (or establish a relationship with a primary care physician if you do not have one) for your aftercare needs so that they can reassess your need for medications and monitor your lab values. If you do not have a primary care physician, you can call [phone] for a physician referral.   Increase activity slowly   Complete by: As directed           Allergies as of 09/08/2022   No Known Allergies      Medication List     TAKE these medications    acetaminophen 650 MG CR tablet Commonly known as: TYLENOL Take 650 mg by mouth every 8 (eight) hours as needed for pain.   allopurinol 300 MG tablet Commonly known as: ZYLOPRIM Take 300 mg by mouth daily.   amLODipine 10 MG tablet Commonly known as: NORVASC Take 10 mg by mouth daily.   atorvastatin 80 MG tablet Commonly known as: LIPITOR Take 80 mg by mouth  at bedtime.   cholecalciferol 10 MCG (400 UNIT) Tabs tablet Commonly known as: VITAMIN D3 Take 400 Units by mouth daily.   dalfampridine 10 MG Tb12 Take 1 tablet by mouth every 12 (twelve) hours.  finasteride 5 MG tablet Commonly known as: PROSCAR Take 5 mg by mouth daily.   Fish Oil 1000 MG Caps Take 1,000 mg by mouth daily.   lidocaine 5 % Commonly known as: LIDODERM Place 1 patch onto the skin daily. Remove & Discard patch within 12 hours or as directed by MD   lisinopril 40 MG tablet Commonly known as: ZESTRIL Take 40 mg by mouth daily.   metFORMIN 500 MG 24 hr tablet Commonly known as: GLUCOPHAGE-XR Take 500 mg by mouth daily.   MICROZIDE PO Take 25 mg by mouth daily.   mirtazapine 15 MG tablet Commonly known as: REMERON Take 15 mg by mouth at bedtime. May repeat with a quarter of a tablet if you wake up - up to twice a night)   multivitamin with minerals Tabs tablet Take 1 tablet by mouth daily.   pantoprazole 40 MG tablet Commonly known as: Protonix Take 1 tablet (40 mg total) by mouth daily. What changed: when to take this   polyethylene glycol 17 g packet Commonly known as: MIRALAX / GLYCOLAX Take 17 g by mouth 2 (two) times daily. What changed: when to take this   potassium chloride SA 20 MEQ tablet Commonly known as: KLOR-CON M Take 20 mEq by mouth daily.   senna-docusate 8.6-50 MG tablet Commonly known as: Senokot-S Take 1 tablet by mouth 2 (two) times daily. What changed: how much to take   tamsulosin 0.4 MG Caps capsule Commonly known as: FLOMAX Take 0.8 mg by mouth daily.   Tecfidera 240 MG Cpdr Generic drug: Dimethyl Fumarate Take 240 mg by mouth 2 (two) times daily.   vitamin C 100 MG tablet Take 500 mg by mouth in the morning and at bedtime.          Follow-up Information     Tapp, Karen Chafe, MD. Schedule an appointment as soon as possible for a visit in 1 week(s).   Specialty: Internal Medicine Why: post hospitalization  follow up Contact information: [address] [place] [address] [phone]                 TOTAL DISCHARGE TIME: 35 minutes  Savas Elvin Rito Ehrlich  [facility] Pager on [email or web address]  09/08/2022, 9:29 AM

## 2022-09-08 NOTE — Progress Notes (Signed)
Physical Therapy Treatment Patient Details Name: James Gibbs MRN: [id number] DOB: 07/15/1955 Today's Date: 09/08/2022   History of Present Illness Pt is a 67 y/o male presenting w/ progressive L LE weakness and sepsis. Pt with rhinovirus.  MRI negative for MS flair. PMH: MS with R LE weakness, HTN, HLD, DM2, BPH, GERD, gout.    PT Comments     Pt admitted with above diagnosis.  Pt currently with functional limitations due to the deficits listed below (see PT Problem List). Pt in bed and agreeable to therapy. Pt required increased time and min cues for bed mobility with S. STS from EOB to Rollator with min guard and cues, pt reports rollator PLOF and is aware of brake management however will benefit from reinforcement for safety. Pt amb 150 feet x 2 in hallway with seated therapeutic rest break on rollator and pt instruction for safe management of AD. Pt elected to return to bed, all needs met and in place. Pt deneis pain, dizziness or SOB. Pt will benefit from acute skilled PT in current setting and next venue to increase their independence and safety with mobility to allow discharge.   Recommendations for follow up therapy are one component of a multi-disciplinary discharge planning process, led by the attending physician.  Recommendations may be updated based on patient status, additional functional criteria and insurance authorization.  Follow Up Recommendations       Assistance Recommended at Discharge Intermittent Supervision/Assistance  Patient can return home with the following A little help with walking and/or transfers;A little help with bathing/dressing/bathroom;Assistance with cooking/housework;Help with stairs or ramp for entrance   Equipment Recommendations  None recommended by PT    Recommendations for Other Services Rehab consult     Precautions / Restrictions Precautions Precautions: Fall Precaution Comments: chronic R sided weakness Restrictions Weight Bearing  Restrictions: No     Mobility  Bed Mobility Overal bed mobility: Needs Assistance Bed Mobility: Supine to Sit, Sit to Supine     Supine to sit: Supervision, HOB elevated Sit to supine: Supervision, HOB elevated   General bed mobility comments: increased time and use of bed rail    Transfers Overall transfer level: Needs assistance   Transfers: Sit to/from Stand Sit to Stand: Min guard           General transfer comment: pt maintains flexed posture with all functional mobility tasks    Ambulation/Gait   Gait Distance (Feet): 150 Feet Assistive device: Rollator (4 wheels) Gait Pattern/deviations: Step-to pattern, Decreased stride length, Trunk flexed, Decreased dorsiflexion - right Gait velocity: decreased     General Gait Details: gait assessed with rollator, flexed posture regardelss of cues noted bounce with gait, limited R LE foot clearance with WB on lateral aspect of foot   Stairs             Wheelchair Mobility    Modified Rankin (Stroke Patients Only)       Balance Overall balance assessment: Needs assistance Sitting-balance support: No upper extremity supported, Feet supported Sitting balance-Leahy Scale: Fair     Standing balance support: No upper extremity supported (static standing reliant on B UE support for dynamic or gait tasks) Standing balance-Leahy Scale: Fair                              Cognition Arousal/Alertness: Awake/alert Behavior During Therapy: WFL for tasks assessed/performed  Exercises      General Comments        Pertinent Vitals/Pain      Home Living Family/patient expects to be discharged to:: Private residence Living Arrangements: Alone Available Help at Discharge: Family Type of Home: House Home Access: Stairs to enter Entrance Stairs-Rails: Can reach both Entrance Stairs-Number of Steps: 3   Home Layout: One level Home  Equipment: Rollator (4 wheels);BSC/3in1;Shower seat;Hand held shower head;Grab bars - tub/shower;Other (comment) (upright walker) Additional Comments: pt livea alone, has been doing OPPT at [facility] in [place], family/daughter helps him as needed.    Prior Function            PT Goals (current goals can now be found in the care plan section) Acute Rehab PT Goals Patient Stated Goal: regain strength and mobility PT Goal Formulation: With patient Time For Goal Achievement: 09/19/22 Potential to Achieve Goals: Good    Frequency    Min 1X/week      PT Plan      Co-evaluation              AM-PAC PT "6 Clicks" Mobility   Outcome Measure  Help needed turning from your back to your side while in a flat bed without using bedrails?: A Little Help needed moving from lying on your back to sitting on the side of a flat bed without using bedrails?: A Little Help needed moving to and from a bed to a chair (including a wheelchair)?: A Little Help needed standing up from a chair using your arms (e.g., wheelchair or bedside chair)?: A Little Help needed to walk in hospital room?: A Little Help needed climbing 3-5 steps with a railing? : A Lot 6 Click Score: 17    End of Session Equipment Utilized During Treatment: Gait belt Activity Tolerance: Patient tolerated treatment well Patient left: with bed alarm set;with call bell/phone within reach Nurse Communication: Mobility status PT Visit Diagnosis: Muscle weakness (generalized) (M62.81);Difficulty in walking, not elsewhere classified (R26.2);Unsteadiness on feet (R26.81);Other abnormalities of gait and mobility (R26.89)     Time: 8299-3716 PT Time Calculation (min) (ACUTE ONLY): 38 min  Charges:  $Gait Training: 8-22 mins $Therapeutic Activity: 23-37 mins                     Rica Mote, PT    Jacqualyn Posey 09/08/2022, 11:11 AM

## 2022-09-08 NOTE — Progress Notes (Signed)
Occupational Therapy Treatment Patient Details Name: James Gibbs MRN: [id number] DOB: 1955-07-22 Today's Date: 09/08/2022   History of present illness Pt is a 67 y/o male presenting w/ progressive L LE weakness and sepsis. Pt with rhinovirus.  MRI negative for MS flair. PMH: MS with R LE weakness, HTN, HLD, DM2, BPH, GERD, gout.   OT comments  Patient progressing and showed improved overall ADLs today as evidenced by an increased score on 6-Clicks AM-PAC measure of occupational Performance with previous score of 16/24, and current score of 18/24.   Patient remains limited by cognitive deficits with need of redirection to task and topic, generalized weakness in setting of chronic RT hemiparesis, and decreased activity tolerance along with deficits noted below. Pt is progressing well, continues to demonstrate good rehab potential and would benefit from continued skilled OT to increase safety and independence with ADLs and functional transfers to allow pt to return home safely and reduce caregiver burden and fall risk.    Recommendations for follow up therapy are one component of a multi-disciplinary discharge planning process, led by the attending physician.  Recommendations may be updated based on patient status, additional functional criteria and insurance authorization.    Assistance Recommended at Discharge Frequent or constant Supervision/Assistance  Patient can return home with the following  Two people to help with walking and/or transfers;Two people to help with bathing/dressing/bathroom   Equipment Recommendations  Other (comment)    Recommendations for Other Services Rehab consult    Precautions / Restrictions Precautions Precautions: Fall Precaution Comments: chronic R sided weakness Restrictions Weight Bearing Restrictions: No       Mobility Bed Mobility Overal bed mobility: Needs Assistance Bed Mobility: Supine to Sit, Sit to Supine     Supine to sit: Min  assist, HOB elevated (Min HHa at trunk after pt tried without.) Sit to supine: Supervision, HOB elevated        Transfers                         Balance Overall balance assessment: Needs assistance Sitting-balance support: No upper extremity supported, Feet supported Sitting balance-Leahy Scale: Fair     Standing balance support: During functional activity, Single extremity supported Standing balance-Leahy Scale: Fair                             ADL either performed or assessed with clinical judgement   ADL Overall ADL's : Needs assistance/impaired     Grooming: Wash/dry face;Oral care;Wash/dry hands;Standing;Supervision/safety Grooming Details (indicate cue type and reason): Cues for placement of RW. Rests forearms on sink which pt reports is baseline.             Lower Body Dressing: Set up;Bed level;Sitting/lateral leans Lower Body Dressing Details (indicate cue type and reason): Pt able to doff and don socks with increased time. Pt doffed at EOB, then moved to long sitting position on bed to don. Toilet Transfer: Nurse, adult (2 wheels) Toilet Transfer Details (indicate cue type and reason): Pt stood from EOB to RW with Min guard assist and ambulated a few steps to sink. Pt stood at sink for grooming then pivoted another few steps to recliner. Pt showed good eccentric control with stand to sit.   Toileting - Clothing Manipulation Details (indicate cue type and reason): Pt on condom catheter.     Functional mobility during ADLs: Min guard;Supervision/safety;Rolling walker (2 wheels)  Extremity/Trunk Assessment Upper Extremity Assessment RUE Deficits / Details: baseline R UE weakness though functional   Lower Extremity Assessment Lower Extremity Assessment: Generalized weakness (R side)   Cervical / Trunk Assessment Cervical / Trunk Assessment: Normal    Vision Baseline Vision/History: 1 Wears glasses Ability to See in  Adequate Light: 1 Impaired Patient Visual Report: No change from baseline     Perception     Praxis      Cognition Arousal/Alertness: Awake/alert Behavior During Therapy: WFL for tasks assessed/performed Overall Cognitive Status: No family/caregiver present to determine baseline cognitive functioning                                 General Comments: Very talkative and needs cues to redirect to task and topic. Very pleasant and likes to engage in discussions.        Exercises      Shoulder Instructions       General Comments      Pertinent Vitals/ Pain       Pain Assessment Pain Assessment: No/denies pain  Home Living Family/patient expects to be discharged to:: Private residence Living Arrangements: Alone Available Help at Discharge: Family Type of Home: House Home Access: Stairs to enter Secretary/administrator of Steps: 3 Entrance Stairs-Rails: Can reach both Home Layout: One level     Bathroom Shower/Tub: Producer, television/film/video: Handicapped height Bathroom Accessibility: No   Home Equipment: Rollator (4 wheels);BSC/3in1;Shower seat;Hand held shower head;Grab bars - tub/shower;Other (comment) (upright walker)   Additional Comments: pt livea alone, has been doing OPPT at [facility] in [place], family/daughter helps him as needed.      Prior Functioning/Environment              Frequency  Min 2X/week        Progress Toward Goals  OT Goals(current goals can now be found in the care plan section)  Progress towards OT goals: Progressing toward goals  Acute Rehab OT Goals Patient Stated Goal: Not to be dependent on Rollator. OT Goal Formulation: With patient Time For Goal Achievement: 09/18/22 Potential to Achieve Goals: Good  Plan Discharge plan remains appropriate    Co-evaluation                 AM-PAC OT "6 Clicks" Daily Activity     Outcome Measure   Help from another person eating meals?: None Help from  another person taking care of personal grooming?: A Little Help from another person toileting, which includes using toliet, bedpan, or urinal?: A Lot Help from another person bathing (including washing, rinsing, drying)?: A Little Help from another person to put on and taking off regular upper body clothing?: A Little Help from another person to put on and taking off regular lower body clothing?: A Little 6 Click Score: 18    End of Session Equipment Utilized During Treatment: Rolling walker (2 wheels)  OT Visit Diagnosis: Unsteadiness on feet (R26.81);Other abnormalities of gait and mobility (R26.89);Muscle weakness (generalized) (M62.81)   Activity Tolerance Patient tolerated treatment well   Patient Left in chair;with call bell/phone within reach;with chair alarm set;with nursing/sitter in room   Nurse Communication Other (comment) (In chair\)        Time: 1916-6060 OT Time Calculation (min): 29 min  Charges: OT General Charges $OT Visit: 1 Visit OT Treatments $Self Care/Home Management : 8-22 mins $Therapeutic Activity: 8-22 mins  Victorino Dike, OT Acute Rehab Services  Office: [phone] 09/08/2022  Theodoro Clock 09/08/2022, 12:05 PM

## 2022-09-09 ENCOUNTER — Inpatient Hospital Stay (HOSPITAL_COMMUNITY)
Admission: RE | Admit: 2022-09-09 | Discharge: 2022-09-20 | DRG: 060 | Disposition: A | Discharge status: Home / Self Care | Payer: No Typology Code available for payment source | Source: Other Acute Inpatient Hospital | Attending: Physical Medicine & Rehabilitation | Admitting: Physical Medicine & Rehabilitation

## 2022-09-09 ENCOUNTER — Encounter: Payer: Self-pay | Admitting: Physical Medicine and Rehabilitation

## 2022-09-09 ENCOUNTER — Other Ambulatory Visit: Payer: Self-pay

## 2022-09-09 ENCOUNTER — Encounter (HOSPITAL_COMMUNITY): Payer: Self-pay | Admitting: Physical Medicine & Rehabilitation

## 2022-09-09 ENCOUNTER — Other Ambulatory Visit: Payer: Self-pay | Admitting: Physical Medicine and Rehabilitation

## 2022-09-09 DIAGNOSIS — B9789 Other viral agents as the cause of diseases classified elsewhere: Secondary | ICD-10-CM | POA: Diagnosis present

## 2022-09-09 DIAGNOSIS — Z79899 Other long term (current) drug therapy: Secondary | ICD-10-CM | POA: Diagnosis not present

## 2022-09-09 DIAGNOSIS — K219 Gastro-esophageal reflux disease without esophagitis: Secondary | ICD-10-CM | POA: Diagnosis present

## 2022-09-09 DIAGNOSIS — G8191 Hemiplegia, unspecified affecting right dominant side: Secondary | ICD-10-CM | POA: Diagnosis present

## 2022-09-09 DIAGNOSIS — E782 Mixed hyperlipidemia: Secondary | ICD-10-CM | POA: Diagnosis present

## 2022-09-09 DIAGNOSIS — G4733 Obstructive sleep apnea (adult) (pediatric): Secondary | ICD-10-CM | POA: Diagnosis present

## 2022-09-09 DIAGNOSIS — Z8249 Family history of ischemic heart disease and other diseases of the circulatory system: Secondary | ICD-10-CM | POA: Diagnosis not present

## 2022-09-09 DIAGNOSIS — G35 Multiple sclerosis: Principal | ICD-10-CM | POA: Diagnosis present

## 2022-09-09 DIAGNOSIS — D696 Thrombocytopenia, unspecified: Secondary | ICD-10-CM | POA: Diagnosis present

## 2022-09-09 DIAGNOSIS — N400 Enlarged prostate without lower urinary tract symptoms: Secondary | ICD-10-CM | POA: Diagnosis present

## 2022-09-09 DIAGNOSIS — R531 Weakness: Secondary | ICD-10-CM

## 2022-09-09 DIAGNOSIS — R32 Unspecified urinary incontinence: Secondary | ICD-10-CM | POA: Diagnosis present

## 2022-09-09 DIAGNOSIS — E118 Type 2 diabetes mellitus with unspecified complications: Secondary | ICD-10-CM | POA: Diagnosis not present

## 2022-09-09 DIAGNOSIS — E1159 Type 2 diabetes mellitus with other circulatory complications: Secondary | ICD-10-CM | POA: Diagnosis present

## 2022-09-09 DIAGNOSIS — R058 Other specified cough: Secondary | ICD-10-CM | POA: Diagnosis not present

## 2022-09-09 DIAGNOSIS — E119 Type 2 diabetes mellitus without complications: Secondary | ICD-10-CM | POA: Diagnosis present

## 2022-09-09 DIAGNOSIS — K5900 Constipation, unspecified: Secondary | ICD-10-CM | POA: Insufficient documentation

## 2022-09-09 DIAGNOSIS — G253 Myoclonus: Secondary | ICD-10-CM | POA: Diagnosis present

## 2022-09-09 DIAGNOSIS — I10 Essential (primary) hypertension: Secondary | ICD-10-CM | POA: Diagnosis present

## 2022-09-09 DIAGNOSIS — Z87891 Personal history of nicotine dependence: Secondary | ICD-10-CM | POA: Diagnosis not present

## 2022-09-09 DIAGNOSIS — E876 Hypokalemia: Secondary | ICD-10-CM | POA: Diagnosis present

## 2022-09-09 DIAGNOSIS — R5381 Other malaise: Secondary | ICD-10-CM

## 2022-09-09 DIAGNOSIS — Z7984 Long term (current) use of oral hypoglycemic drugs: Secondary | ICD-10-CM | POA: Diagnosis not present

## 2022-09-09 DIAGNOSIS — B348 Other viral infections of unspecified site: Secondary | ICD-10-CM | POA: Insufficient documentation

## 2022-09-09 HISTORY — DX: Type 2 diabetes mellitus without complications: E11.9

## 2022-09-09 LAB — GLUCOSE, CAPILLARY
Glucose-Capillary: 104 mg/dL — ABNORMAL HIGH (ref 70–99)
Glucose-Capillary: 86 mg/dL (ref 70–99)
Glucose-Capillary: 130 mg/dL — ABNORMAL HIGH (ref 70–99)
Glucose-Capillary: 140 mg/dL — ABNORMAL HIGH (ref 70–99)
Glucose-Capillary: 163 mg/dL — ABNORMAL HIGH (ref 70–99)

## 2022-09-09 LAB — CULTURE, BLOOD (ROUTINE X 2): Special Requests: ADEQUATE

## 2022-09-09 MED ORDER — MIRTAZAPINE 15 MG PO TABS
15.0000 mg | ORAL_TABLET | Freq: Every day | ORAL | Status: DC
  Administered 2022-09-09 – 2022-09-19 (×11): 15 mg via ORAL
  Filled 2022-09-09 (×11): qty 1

## 2022-09-09 MED ORDER — PROCHLORPERAZINE EDISYLATE 10 MG/2ML IJ SOLN: 5.0000 mg | Freq: Four times a day (QID) | INTRAMUSCULAR | Status: DC | PRN

## 2022-09-09 MED ORDER — DIMETHYL FUMARATE 240 MG PO CPDR
240.0000 mg | DELAYED_RELEASE_CAPSULE | Freq: Two times a day (BID) | ORAL | Status: DC
  Administered 2022-09-09 – 2022-09-20 (×22): 240 mg via ORAL
  Filled 2022-09-09 (×21): qty 1

## 2022-09-09 MED ORDER — PROCHLORPERAZINE 25 MG RE SUPP: 12.5000 mg | Freq: Four times a day (QID) | RECTAL | Status: DC | PRN

## 2022-09-09 MED ORDER — BISACODYL 10 MG RE SUPP: 10.0000 mg | Freq: Every day | RECTAL | Status: DC | PRN

## 2022-09-09 MED ORDER — NEPRO/CARBSTEADY PO LIQD: 237.0000 mL | Freq: Three times a day (TID) | ORAL | Status: DC

## 2022-09-09 MED ORDER — POLYETHYLENE GLYCOL 3350 17 G PO PACK
17.0000 g | PACK | Freq: Two times a day (BID) | ORAL | Status: DC
  Administered 2022-09-09 – 2022-09-19 (×19): 17 g via ORAL
  Filled 2022-09-09 (×21): qty 1

## 2022-09-09 MED ORDER — GUAIFENESIN-DM 100-10 MG/5ML PO SYRP
5.0000 mL | ORAL_SOLUTION | Freq: Four times a day (QID) | ORAL | Status: DC | PRN
  Administered 2022-09-11 – 2022-09-15 (×5): 10 mL via ORAL
  Filled 2022-09-09 (×5): qty 10

## 2022-09-09 MED ORDER — INSULIN ASPART 100 UNIT/ML IJ SOLN: 0.0000 [IU] | Freq: Every day | INTRAMUSCULAR | Status: DC

## 2022-09-09 MED ORDER — CINACALCET HCL 30 MG PO TABS: 60.0000 mg | ORAL_TABLET | ORAL | Status: DC

## 2022-09-09 MED ORDER — ALLOPURINOL 300 MG PO TABS
300.0000 mg | ORAL_TABLET | Freq: Every day | ORAL | Status: DC
  Administered 2022-09-10 – 2022-09-20 (×11): 300 mg via ORAL
  Filled 2022-09-09 (×13): qty 1

## 2022-09-09 MED ORDER — PROCHLORPERAZINE MALEATE 5 MG PO TABS: 5.0000 mg | ORAL_TABLET | Freq: Four times a day (QID) | ORAL | Status: DC | PRN

## 2022-09-09 MED ORDER — ORAL CARE MOUTH RINSE: 15.0000 mL | OROMUCOSAL | Status: DC | PRN

## 2022-09-09 MED ORDER — ALUM & MAG HYDROXIDE-SIMETH 200-200-20 MG/5ML PO SUSP: 30.0000 mL | ORAL | Status: DC | PRN

## 2022-09-09 MED ORDER — POLYETHYLENE GLYCOL 3350 17 G PO PACK: 17.0000 g | PACK | Freq: Every day | ORAL | Status: DC | PRN

## 2022-09-09 MED ORDER — PANTOPRAZOLE SODIUM 40 MG PO TBEC: 40.0000 mg | DELAYED_RELEASE_TABLET | Freq: Every day | ORAL | Status: DC

## 2022-09-09 MED ORDER — INSULIN ASPART 100 UNIT/ML IJ SOLN: 0.0000 [IU] | Freq: Three times a day (TID) | INTRAMUSCULAR | Status: DC

## 2022-09-09 MED ORDER — DALFAMPRIDINE ER 10 MG PO TB12
10.0000 mg | ORAL_TABLET | Freq: Two times a day (BID) | ORAL | Status: DC
  Administered 2022-09-13 – 2022-09-20 (×14): 10 mg via ORAL
  Filled 2022-09-09 (×13): qty 1

## 2022-09-09 MED ORDER — TAMSULOSIN HCL 0.4 MG PO CAPS
0.8000 mg | ORAL_CAPSULE | Freq: Every day | ORAL | Status: DC
  Administered 2022-09-10 – 2022-09-19 (×10): 0.8 mg via ORAL
  Filled 2022-09-09 (×10): qty 2

## 2022-09-09 MED ORDER — FLEET ENEMA 7-19 GM/118ML RE ENEM: 1.0000 | ENEMA | Freq: Once | RECTAL | Status: DC | PRN

## 2022-09-09 MED ORDER — RENA-VITE PO TABS: 1.0000 | ORAL_TABLET | Freq: Every day | ORAL | Status: DC

## 2022-09-09 MED ORDER — CHOLECALCIFEROL 10 MCG (400 UNIT) PO TABS
400.0000 [IU] | ORAL_TABLET | Freq: Every day | ORAL | Status: DC
  Administered 2022-09-10 – 2022-09-20 (×11): 400 [IU] via ORAL
  Filled 2022-09-09 (×11): qty 1

## 2022-09-09 MED ORDER — VITAMIN C 500 MG PO TABS: 250.0000 mg | ORAL_TABLET | Freq: Every day | ORAL | Status: DC

## 2022-09-09 MED ORDER — MELATONIN 3 MG PO TABS
3.0000 mg | ORAL_TABLET | Freq: Every evening | ORAL | Status: DC | PRN
  Administered 2022-09-17: 3 mg via ORAL
  Filled 2022-09-09 (×2): qty 1

## 2022-09-09 MED ORDER — FINASTERIDE 5 MG PO TABS
5.0000 mg | ORAL_TABLET | Freq: Every day | ORAL | Status: DC
  Administered 2022-09-10 – 2022-09-20 (×11): 5 mg via ORAL
  Filled 2022-09-09 (×11): qty 1

## 2022-09-09 MED ORDER — AMLODIPINE BESYLATE 10 MG PO TABS
10.0000 mg | ORAL_TABLET | Freq: Every day | ORAL | Status: DC
  Administered 2022-09-10 – 2022-09-20 (×11): 10 mg via ORAL
  Filled 2022-09-09 (×11): qty 1

## 2022-09-09 MED ORDER — ZINC SULFATE 220 (50 ZN) MG PO CAPS
220.0000 mg | ORAL_CAPSULE | Freq: Every day | ORAL | Status: DC
  Administered 2022-09-09 – 2022-09-19 (×11): 220 mg via ORAL
  Filled 2022-09-09 (×11): qty 1

## 2022-09-09 MED ORDER — CALCIUM CARBONATE ANTACID 500 MG PO CHEW: 1.0000 | CHEWABLE_TABLET | ORAL | Status: DC | PRN

## 2022-09-09 MED ORDER — PANTOPRAZOLE SODIUM 40 MG PO TBEC
40.0000 mg | DELAYED_RELEASE_TABLET | Freq: Every day | ORAL | Status: DC
  Administered 2022-09-10 – 2022-09-20 (×11): 40 mg via ORAL
  Filled 2022-09-09 (×11): qty 1

## 2022-09-09 MED ORDER — ACETAMINOPHEN 325 MG PO TABS
325.0000 mg | ORAL_TABLET | ORAL | Status: DC | PRN
  Filled 2022-09-09: qty 2

## 2022-09-09 MED ORDER — DIPHENHYDRAMINE HCL 12.5 MG/5ML PO ELIX: 12.5000 mg | ORAL_SOLUTION | Freq: Four times a day (QID) | ORAL | Status: DC | PRN

## 2022-09-09 MED ORDER — POLYVINYL ALCOHOL 1.4 % OP SOLN
1.0000 [drp] | Freq: Four times a day (QID) | OPHTHALMIC | Status: DC
  Administered 2022-09-09 – 2022-09-20 (×35): 1 [drp] via OPHTHALMIC
  Filled 2022-09-09: qty 15

## 2022-09-09 MED ORDER — SENNOSIDES-DOCUSATE SODIUM 8.6-50 MG PO TABS
2.0000 | ORAL_TABLET | Freq: Every day | ORAL | Status: DC
  Administered 2022-09-09 – 2022-09-19 (×11): 2 via ORAL
  Filled 2022-09-09 (×11): qty 2

## 2022-09-09 MED ORDER — SALINE SPRAY 0.65 % NA SOLN: 1.0000 | NASAL | Status: DC | PRN

## 2022-09-09 MED ORDER — VITAMIN C 500 MG PO TABS
500.0000 mg | ORAL_TABLET | Freq: Every day | ORAL | Status: DC
  Administered 2022-09-10 – 2022-09-20 (×11): 500 mg via ORAL
  Filled 2022-09-09 (×11): qty 1

## 2022-09-09 MED ORDER — MILK AND MOLASSES ENEMA: 1.0000 | Freq: Every day | RECTAL | Status: DC | PRN

## 2022-09-09 MED ORDER — PHENOL 1.4 % MT LIQD: 1.0000 | OROMUCOSAL | Status: DC | PRN

## 2022-09-09 MED ORDER — SORBITOL 70 % SOLN: 60.0000 mL | Freq: Every day | Status: DC | PRN

## 2022-09-09 MED ORDER — ATORVASTATIN CALCIUM 80 MG PO TABS
80.0000 mg | ORAL_TABLET | Freq: Every day | ORAL | Status: DC
  Administered 2022-09-09 – 2022-09-19 (×11): 80 mg via ORAL
  Filled 2022-09-09 (×11): qty 1

## 2022-09-09 MED ORDER — ENOXAPARIN SODIUM 40 MG/0.4ML IJ SOSY
40.0000 mg | PREFILLED_SYRINGE | INTRAMUSCULAR | Status: DC
  Filled 2022-09-09 (×4): qty 0.4

## 2022-09-09 NOTE — Progress Notes (Signed)
Physical Therapy Treatment Patient Details Name: James Gibbs MRN: [id number] DOB: Sep 27, 1955 Today's Date: 09/09/2022   History of Present Illness Pt is a 67 y/o male presenting w/ progressive L LE weakness and sepsis. Pt with rhinovirus.  MRI negative for MS flair. PMH: MS with R LE weakness, HTN, HLD, DM2, BPH, GERD, gout.    PT Comments     Pt admitted with above diagnosis.  Pt currently with functional limitations due to the deficits listed below (see PT Problem List). Pt in bed when PT arrived, S for supine to sit with HOB elevated, pt required min guard and cues for transfer at rollator level and will benefit from reinforcement for brake management, gait training continued with rollator with pt demonstrating improved gait tolerance of 225. Pt elected to remain seated EOB and all needs met with nurse entering room. It is anticipated pt will transition to CIR for continued therapy services. Pt will benefit from acute skilled PT to increase their independence and safety with mobility to allow discharge.     Recommendations for follow up therapy are one component of a multi-disciplinary discharge planning process, led by the attending physician.  Recommendations may be updated based on patient status, additional functional criteria and insurance authorization.  Follow Up Recommendations       Assistance Recommended at Discharge Intermittent Supervision/Assistance  Patient can return home with the following A little help with walking and/or transfers;A little help with bathing/dressing/bathroom;Assistance with cooking/housework;Help with stairs or ramp for entrance   Equipment Recommendations  None recommended by PT    Recommendations for Other Services Rehab consult     Precautions / Restrictions Precautions Precautions: Fall Precaution Comments: chronic R sided weakness Restrictions Weight Bearing Restrictions: No     Mobility  Bed Mobility Overal bed mobility: Needs  Assistance Bed Mobility: Supine to Sit     Supine to sit: HOB elevated, Supervision (Min HHa at trunk after pt tried without.)     General bed mobility comments: increased time and use of bed rail    Transfers Overall transfer level: Needs assistance Equipment used: Rolling walker (2 wheels) Transfers: Sit to/from Stand Sit to Stand: Min guard           General transfer comment: pt maintains flexed posture with all functional mobility tasks    Ambulation/Gait Ambulation/Gait assistance: Min guard (and pt progressed to intermittent S) Gait Distance (Feet): 225 Feet Assistive device: Rollator (4 wheels) Gait Pattern/deviations: Step-to pattern, Decreased stride length, Trunk flexed, Decreased dorsiflexion - right Gait velocity: decreased     General Gait Details: gait assessed with rollator, flexed posture regardelss of cues noted bounce with gait, limited R LE foot clearance with WB on lateral aspect of foot   Stairs             Wheelchair Mobility    Modified Rankin (Stroke Patients Only)       Balance Overall balance assessment: Needs assistance Sitting-balance support: No upper extremity supported, Feet supported Sitting balance-Leahy Scale: Good     Standing balance support: During functional activity, Bilateral upper extremity supported, Reliant on assistive device for balance Standing balance-Leahy Scale: Fair                              Cognition Arousal/Alertness: Awake/alert Behavior During Therapy: WFL for tasks assessed/performed Overall Cognitive Status: No family/caregiver present to determine baseline cognitive functioning  General Comments: Very talkative and needs cues to redirect to task and topic. Very pleasant and likes to engage in discussions.        Exercises      General Comments        Pertinent Vitals/Pain      Home Living                           Prior Function            PT Goals (current goals can now be found in the care plan section) Acute Rehab PT Goals Patient Stated Goal: regain strength and mobility PT Goal Formulation: With patient Time For Goal Achievement: 09/19/22 Potential to Achieve Goals: Good Progress towards PT goals: Progressing toward goals    Frequency    Min 1X/week      PT Plan Current plan remains appropriate    Co-evaluation   Reason for Co-Treatment: To address functional/ADL transfers;For patient/therapist safety   OT goals addressed during session: ADL's and self-care;Proper use of Adaptive equipment and DME;Strengthening/ROM      AM-PAC PT "6 Clicks" Mobility   Outcome Measure  Help needed turning from your back to your side while in a flat bed without using bedrails?: A Little Help needed moving from lying on your back to sitting on the side of a flat bed without using bedrails?: A Little Help needed moving to and from a bed to a chair (including a wheelchair)?: A Little Help needed standing up from a chair using your arms (e.g., wheelchair or bedside chair)?: A Little Help needed to walk in hospital room?: A Little Help needed climbing 3-5 steps with a railing? : A Lot 6 Click Score: 17    End of Session Equipment Utilized During Treatment: Gait belt Activity Tolerance: Patient tolerated treatment well Patient left: with bed alarm set;with call bell/phone within reach;in bed (seated EOB and nurse entering room) Nurse Communication: Mobility status PT Visit Diagnosis: Muscle weakness (generalized) (M62.81);Difficulty in walking, not elsewhere classified (R26.2);Unsteadiness on feet (R26.81);Other abnormalities of gait and mobility (R26.89)     Time: 1002-1020 PT Time Calculation (min) (ACUTE ONLY): 18 min  Charges:  $Gait Training: 8-22 mins                      Rica Mote, PT    Jacqualyn Posey 09/09/2022, 11:18 AM

## 2022-09-09 NOTE — H&P (Signed)
Physical Medicine and Rehabilitation Admission H&P        Chief Complaint  Patient presents with   Functional deficits due to MS peudoexacerbation.      HPI: James Gibbs. James Gibbs is a 67 year old RH-male with history of MS, LVH, HTN. T2DM, GERD, BPH, MS with right hemiparesis; who was admitted on 09/03/22 with weakness and inability to move his Left leg and inability to get out of bed or walk. He reported having root canal done earlier that day at the [place]. At admission, he was febrile T-102.2 with HR 102 and tachypnic with RR 29. one day history of weakness with inability to get out of bed, cough and being chilled. He reported having cough and was started on broad spectrum antibiotics. CXR, COVID/RSV PCRs and BC negative therefore antibiotics discontinued.  Respiratory culture showed rhinovirus/enterovirus. Hospital course significant for hypokalemia, prolonged QT as well a BLE weakness affecting mobility.  MRI brain and C spine done showing extensive white matter changes due to MS and/or small vessel disease and no abnormal enhancement. Generalized weakness felt to be due to MS pseudoexacerbation and PT/OT has been working with patient who continues to be limited by fatigue, flexed posture with right hemiparesis and cognitive deficits needing redirection. CIR recommended due to functional decline.     Pt reports uses a RW and cane at home- was using RW at [employer]- doesn't want to be here- didn't want to come.  Thinks his cognition is back to baseline, however did describe a nightmare last night and woke up and initially didn't know where he was.    LBM yesterday- usually takes Miralax throughout day and has 3 BM's /day.  Voiding well but has chronic bladder incontinence- doesn't usually wear briefs, but his daughter said would bring some, per pt.  Does feels still a little weaker than normal in RLE.    Denies muscle spasms from MS.      [facility]- no Rehab  doctor.    Review of Systems  Constitutional:  Negative for fever.  HENT:  Negative for hearing loss.   Respiratory:  Positive for cough. Negative for shortness of breath.   Cardiovascular:  Negative for chest pain.  Gastrointestinal:  Negative for constipation.  Genitourinary:  Negative for dysuria.  Musculoskeletal:  Positive for falls (last a year ago in the yard). Negative for myalgias.  Skin:  Negative for rash.  Neurological:  Positive for weakness.  Psychiatric/Behavioral:  The patient is not nervous/anxious and does not have insomnia.   All other systems reviewed and are negative.           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     Malignant hypertension     Mitral valve anterior leaflet prolapse     Mixed hyperlipidemia             Past Surgical History:  Procedure Laterality Date   COLONOSCOPY WITH PROPOFOL N/A 12/17/2018    Procedure: COLONOSCOPY WITH PROPOFOL;  Surgeon: Bernette Redbird, MD;  Location: [facility];  Service: Endoscopy;  Laterality: N/A;   ESOPHAGOGASTRODUODENOSCOPY (EGD) WITH PROPOFOL N/A 12/14/2018    Procedure: ESOPHAGOGASTRODUODENOSCOPY (EGD) MODERATE SEDATION;  Surgeon: Bernette Redbird, MD;  Location: [facility];  Service: Endoscopy;  Laterality: N/A;   GIVENS CAPSULE STUDY N/A 12/18/2018    Procedure: GIVENS CAPSULE STUDY;  Surgeon: Willis Modena, MD;  Location: [facility];  Service: Endoscopy;  Laterality: N/A;   none               Family History  Problem Relation Age of Onset   Hypertension Mother     Hypertension Father        Social History:  Lives alone and independent PTA. Has help with housework. He  reports that he smoked for 10 years and quit 30+ years ago. Marland Kitchen He has never used smokeless tobacco. He reports that he does not drink alcohol and does not use drugs.     Allergies: No Known Allergies           Medications Prior to Admission  Medication Sig Dispense  Refill   acetaminophen (TYLENOL) 650 MG CR tablet Take 650 mg by mouth every 8 (eight) hours as needed for pain.       allopurinol (ZYLOPRIM) 300 MG tablet Take 300 mg by mouth daily.       amLODipine (NORVASC) 10 MG tablet Take 10 mg by mouth daily.       Ascorbic Acid (VITAMIN C) 100 MG tablet Take 500 mg by mouth in the morning and at bedtime.       atorvastatin (LIPITOR) 80 MG tablet Take 80 mg by mouth at bedtime.       cholecalciferol (VITAMIN D) 400 UNITS TABS tablet Take 400 Units by mouth daily.       dalfampridine 10 MG TB12 Take 1 tablet by mouth every 12 (twelve) hours.       Dimethyl Fumarate (TECFIDERA) 240 MG CPDR Take 240 mg by mouth 2 (two) times daily.       finasteride (PROSCAR) 5 MG tablet Take 5 mg by mouth daily.       Hydrochlorothiazide (MICROZIDE PO) Take 25 mg by mouth daily.       lidocaine (LIDODERM) 5 % Place 1 patch onto the skin daily. Remove & Discard patch within 12 hours or as directed by MD       lisinopril (PRINIVIL,ZESTRIL) 40 MG tablet Take 40 mg by mouth daily.       metFORMIN (GLUCOPHAGE-XR) 500 MG 24 hr tablet Take 500 mg by mouth daily.       mirtazapine (REMERON) 15 MG tablet Take 15 mg by mouth at bedtime. May repeat with a quarter of a tablet if you wake up - up to twice a night)       Multiple Vitamin (MULTIVITAMIN WITH MINERALS) TABS tablet Take 1 tablet by mouth daily.       Omega-3 Fatty Acids (FISH OIL) 1000 MG CAPS Take 1,000 mg by mouth daily.       pantoprazole (PROTONIX) 40 MG tablet Take 1 tablet (40 mg total) by mouth daily. (Patient taking differently: Take 40 mg by mouth 2 (two) times daily.) 30 tablet 1   polyethylene glycol (MIRALAX / GLYCOLAX) 17 g packet Take 17 g by mouth 2 (two) times daily. (Patient taking differently: Take 17 g by mouth daily.) 14 each 0   potassium chloride SA (K-DUR) 20 MEQ tablet Take 20 mEq by mouth daily.       senna-docusate (SENOKOT-S) 8.6-50 MG tablet Take 1 tablet by mouth 2 (two) times daily. (Patient  taking differently: Take 2 tablets by mouth 2 (two) times daily.)       tamsulosin (FLOMAX) 0.4 MG CAPS capsule Take 0.8 mg by mouth daily.              Home: Home Living Family/patient expects to be discharged to::  Private residence Living Arrangements: Alone Available Help at Discharge: Family Type of Home: House Home Access: Stairs to enter Secretary/administrator of Steps: 3 Entrance Stairs-Rails: Can reach both Home Layout: One level Bathroom Shower/Tub: Health visitor: Handicapped height Bathroom Accessibility: No Home Equipment: Rollator (4 wheels), BSC/3in1, Shower seat, Hand held shower head, Grab bars - tub/shower, Other (comment) (upright walker) Additional Comments: pt livea alone, has been doing OPPT at [facility] in [place], family/daughter helps him as needed.   Functional History: Prior Function Prior Level of Function : Independent/Modified Independent Mobility Comments: mobilizes with rollator in the home, uses upright walker in the community ADLs Comments: uses [employer] on wheels for L/D every day M-F he has cereal in morning and manages as needed on the weekends. reports managing ADLs w/o assist; has medical alert necklace (has access to when showering as well)   Functional Status:  Mobility: Bed Mobility Overal bed mobility: Needs Assistance Bed Mobility: Supine to Sit Supine to sit: HOB elevated, Supervision (Min HHa at trunk after pt tried without.) Sit to supine: Supervision, HOB elevated General bed mobility comments: increased time and use of bed rail Transfers Overall transfer level: Needs assistance Equipment used: Rolling walker (2 wheels) Transfers: Sit to/from Stand Sit to Stand: Min guard Bed to/from chair/wheelchair/BSC transfer type:: Via Financial planner via Lift Equipment: [employer] transfer comment: pt maintains flexed posture with all functional mobility tasks Ambulation/Gait Ambulation/Gait assistance: Min guard  (and pt progressed to intermittent S) Gait Distance (Feet): 225 Feet Assistive device: Rollator (4 wheels) Gait Pattern/deviations: Step-to pattern, Decreased stride length, Trunk flexed, Decreased dorsiflexion - right General Gait Details: gait assessed with rollator, flexed posture regardelss of cues noted bounce with gait, limited R LE foot clearance with WB on lateral aspect of foot Gait velocity: decreased   ADL: ADL Overall ADL's : Needs assistance/impaired Eating/Feeding: Independent Grooming: Wash/dry face, Oral care, Wash/dry hands, Standing, Supervision/safety Grooming Details (indicate cue type and reason): Cues for placement of RW. Rests forearms on sink which pt reports is baseline. Upper Body Bathing: Minimal assistance, Sitting Upper Body Bathing Details (indicate cue type and reason): assist w/ back Lower Body Bathing: Moderate assistance, Sitting/lateral leans, Sit to/from stand, +2 for physical assistance, +2 for safety/equipment Lower Body Bathing Details (indicate cue type and reason): able to bathe reaching down to lower LE sitting EOB. assist for posterior hygiene standing with RW. Then able to assist with anterior peri care standing in [place] Upper Body Dressing : Minimal assistance, Sitting Lower Body Dressing: Set up, Bed level, Sitting/lateral leans Lower Body Dressing Details (indicate cue type and reason): Pt able to doff and don socks with increased time. Pt doffed at EOB, then moved to long sitting position on bed to don. Toilet Transfer: Min guard, Rolling walker (2 wheels) Toilet Transfer Details (indicate cue type and reason): Pt stood from EOB to RW with Min guard assist and ambulated a few steps to sink. Pt stood at sink for grooming then pivoted another few steps to recliner. Pt showed good eccentric control with stand to sit. Toileting- Clothing Manipulation and Hygiene: Maximal assistance, +2 for physical assistance, +2 for safety/equipment, Sit to/from  stand, Sitting/lateral lean Toileting - Clothing Manipulation Details (indicate cue type and reason): Pt on condom catheter. Functional mobility during ADLs: Min guard, Supervision/safety, Rolling walker (2 wheels) General ADL Comments: Limited by chronic R weakness and new L sided weakness   Cognition: Cognition Overall Cognitive Status: No family/caregiver present to determine baseline cognitive functioning Orientation  Level: Oriented to person, Disoriented to place, Disoriented to time, Disoriented to situation Cognition Arousal/Alertness: Awake/alert Behavior During Therapy: WFL for tasks assessed/performed Overall Cognitive Status: No family/caregiver present to determine baseline cognitive functioning General Comments: Very talkative and needs cues to redirect to task and topic. Very pleasant and likes to engage in discussions.     Blood pressure (!) 141/82, pulse 66, temperature 97.8 F (36.6 C), temperature source Oral, resp. rate (!) 22, height 5\' 11"  (1.803 m), weight 102.5 kg, SpO2 97 %. Physical Exam Vitals and nursing note reviewed.  Constitutional:      Appearance: Normal appearance.     Comments: Older gentleman, awake, alert, appropriate, slightly irritable, sitting up in bed, NAD  HENT:     Head: Normocephalic and atraumatic.     Comments: No facial droop    Right Ear: External ear normal.     Left Ear: External ear normal.     Nose: Nose normal. No congestion.     Mouth/Throat:     Mouth: Mucous membranes are dry.     Pharynx: Oropharynx is clear. No oropharyngeal exudate.  Eyes:     General:        Right eye: No discharge.        Left eye: No discharge.     Extraocular Movements: Extraocular movements intact.  Cardiovascular:     Rate and Rhythm: Normal rate and regular rhythm.     Heart sounds: Normal heart sounds. No murmur heard.    No gallop.  Pulmonary:     Effort: Pulmonary effort is normal. No respiratory distress.     Breath sounds: Wheezing  present. No rhonchi or rales.     Comments: A couple of wheezes heard- slightly coarse, but good air movement Abdominal:     General: Bowel sounds are normal. There is no distension.     Palpations: Abdomen is soft.     Tenderness: There is no abdominal tenderness.  Genitourinary:    Comments: Genitals look ok- was incontinent of urine slightly.  Musculoskeletal:     Cervical back: Neck supple. No tenderness.     Comments: UE strength 5/5 B/L RLE HF, KE, KF, DF and PF 5-/5- just slightly weaker than L side LLE- 5/5   Skin:    General: Skin is warm and dry.     Comments: Skin looks well on heels, genitals and backside  Neurological:     Mental Status: He is alert and oriented to person, place, and time.     Comments: Ox3 Intact to light touch in all 4 extremities No increased tone felt and Hoffman's (-); no clonus  Psychiatric:     Comments: Slightly irritable, but polite        Lab Results Last 48 Hours        Results for orders placed or performed during the hospital encounter of 09/03/22 (from the past 48 hour(s))  Glucose, capillary     Status: Abnormal    Collection Time: 09/09/22  4:25 AM  Result Value Ref Range    Glucose-Capillary 104 (H) 70 - 99 mg/dL      Comment: Glucose reference range applies only to samples taken after fasting for at least 8 hours.  Glucose, capillary     Status: None    Collection Time: 09/09/22  7:58 AM  Result Value Ref Range    Glucose-Capillary 86 70 - 99 mg/dL      Comment: Glucose reference range applies only to samples taken after  fasting for at least 8 hours.      Imaging Results (Last 48 hours)  No results found.         Blood pressure (!) 141/82, pulse 66, temperature 97.8 F (36.6 C), temperature source Oral, resp. rate (!) 22, height 5\' 11"  (1.803 m), weight 102.5 kg, SpO2 97 %.   Medical Problem List and Plan: 1. Functional deficits secondary to pseudoexacerbation of MS due to rhinovirus             -patient may   shower             -ELOS/Goals: 7-10 days- mod I- pt doesn't want to "stay long, unhappy he's here".  2.  Antithrombotics: -DVT/anticoagulation:  Pharmaceutical: Lovenox- pt absolutely refuses any type of blood thinner, however willing to use SCDs             -antiplatelet therapy: N/A 3. Pain Management:  tylenol prn.  4. Mood/Behavior/Sleep: LCSW to follow for evaluation and support.              -antipsychotic agents: N/A 5. Neuropsych/cognition: This patient appears capable of making decisions on his own behalf. 6. Skin/Wound Care: Routine pressure relief measures.  7. Fluids/Electrolytes/Nutrition: Monitor I/O. Check CMET in am 8. MS  peudoexacerbation: Improving. Continue Dalfampridine and Dimethyl furmarate.  9.  HTN: Monitor BP TID--on proscar and amlodipine.              --continue to hold HCTZ/K dur 10.  T2DM: Hgb A1C- Monitor BS ac/hs--variable and question intake.  --Will continue to hold metformin. 11. H/o BPH: Continue proscar and flomax-->change flomax to evenings to avoid orthostatic changes. 12. Constipation: Continue miralax bid.  13. OSA: Continue CPAP.  14. GERD:Symptoms managed with PPI 15. Thrombocytopenia: Monitor for signs of bleeding. Recheck plts in am.   16. Droplet precautions- not clear when they will stop       Jacquelynn Cree, PA-C 09/09/2022   I have personally performed a face to face diagnostic evaluation of this patient and formulated the key components of the plan.  Additionally, I have personally reviewed laboratory data, imaging studies, as well as relevant notes and concur with the physician assistant's documentation above.   The patient's status has not changed from the original H&P.  Any changes in documentation from the acute care chart have been noted above.

## 2022-09-09 NOTE — Progress Notes (Signed)
Patient refuses to take Lovenox. States he does not take "blood thinners" and that "everyone knows it" and that "[facility] knew". Explained risks of not taking Lovenox. Patient understands. Called on-call provider and spoke with [facility]. PA states to offer ted hose. Patient declined teds, states he wore SCDs at [employer]. Informed charge nurse, standing order was placed. Called portable to get SCDs for patient.

## 2022-09-09 NOTE — TOC Transition Note (Signed)
Transition of Care [facility]) - CM/SW Discharge Note   Patient Details  Name: James Gibbs MRN: [id number] Date of Birth: [birth year]  [facility] CM/SW Contact:  Larrie Kass, LCSW Phone Number: 09/09/2022, 11:35 AM   Clinical Narrative:     Pt to admit to CIR. No additional TOC needs TOC sign off.     Barriers to Discharge: Insurance Authorization   Patient Goals and CMS Choice CMS Medicare.gov Compare Post Acute Care list provided to:: Patient Choice offered to / list presented to : Patient  Discharge Placement                         Discharge Plan and Services Additional resources added to the After Visit Summary for   In-house Referral: NA Discharge Planning Services: NA Post Acute Care Choice: IP Rehab          DME Arranged: N/A DME Agency: NA                  Social Determinants of Health (SDOH) Interventions SDOH Screenings   Food Insecurity: No Food Insecurity (09/04/2022)  Housing: Low Risk  (09/04/2022)  Transportation Needs: No Transportation Needs (09/04/2022)  Utilities: Not At Risk (09/04/2022)  Tobacco Use: Low Risk  (09/03/2022)     Readmission Risk Interventions    09/06/2022   10:20 AM  Readmission Risk Prevention Plan  Transportation Screening Complete  PCP or Specialist Appt within 5-7 Days Complete  Home Care Screening Complete  Medication Review (RN CM) Complete

## 2022-09-09 NOTE — Progress Notes (Signed)
Inpatient Rehab Admissions Coordinator:    I have a CIR bed for this pt today. RN may call report to [phone]  Pt. In agreement to transfer to CIR to participate in an intensive rehab program for 7-10 days with the goal of discharging home without caregiver support at modified independent level. Mod I goals were discussed with Rehab MD, Dr. Riley Kill, and he feels this is an achievable goal for pt.  Megan Salon, MS, CCC-SLP Rehab Admissions Coordinator  [phone] (celll) [phone] (office)

## 2022-09-09 NOTE — Progress Notes (Signed)
Patient received discharge orders to go to CIR. Patient was given discharge paperwork/instructions. RN went over discharge instructions/paperwork with patient. All questions and concerns were addressed and answered. Patient's medication from the pharmacy were returned to patient. RN gave report to [place] at receiving facility (CIR) about patient prior transport. Patient was picked up by [facility] to be taken to CIR. Upon discharge, patient was stable, had discharge paperwork/instructions, had medications, and had all personal belongings. Patient told RN that he would call his daughter once he got to CIR.

## 2022-09-09 NOTE — Plan of Care (Signed)
  Problem: Education: Goal: Knowledge of General Education information will improve Description: Including pain rating scale, medication(s)/side effects and non-pharmacologic comfort measures Outcome: Completed/Met   Problem: Health Behavior/Discharge Planning: Goal: Ability to manage health-related needs will improve Outcome: Completed/Met   Problem: Clinical Measurements: Goal: Ability to maintain clinical measurements within normal limits will improve Outcome: Completed/Met Goal: Will remain free from infection Outcome: Completed/Met Goal: Diagnostic test results will improve Outcome: Completed/Met Goal: Respiratory complications will improve Outcome: Completed/Met Goal: Cardiovascular complication will be avoided Outcome: Completed/Met   Problem: Activity: Goal: Risk for activity intolerance will decrease Outcome: Completed/Met   Problem: Nutrition: Goal: Adequate nutrition will be maintained Outcome: Completed/Met   Problem: Coping: Goal: Level of anxiety will decrease Outcome: Completed/Met   Problem: Elimination: Goal: Will not experience complications related to bowel motility Outcome: Completed/Met Goal: Will not experience complications related to urinary retention Outcome: Completed/Met   Problem: Pain Managment: Goal: General experience of comfort will improve Outcome: Completed/Met   Problem: Safety: Goal: Ability to remain free from injury will improve Outcome: Completed/Met   Problem: Skin Integrity: Goal: Risk for impaired skin integrity will decrease Outcome: Completed/Met   Problem: Education: Goal: Knowledge of disease or condition will improve Outcome: Completed/Met Goal: Knowledge of secondary prevention will improve (MUST DOCUMENT ALL) Outcome: Completed/Met Goal: Knowledge of patient specific risk factors will improve Loraine Leriche N/A or DELETE if not current risk factor) Outcome: Completed/Met   Problem: Ischemic Stroke/TIA Tissue  Perfusion: Goal: Complications of ischemic stroke/TIA will be minimized Outcome: Completed/Met   Problem: Coping: Goal: Will verbalize positive feelings about self Outcome: Completed/Met Goal: Will identify appropriate support needs Outcome: Completed/Met   Problem: Health Behavior/Discharge Planning: Goal: Ability to manage health-related needs will improve Outcome: Completed/Met Goal: Goals will be collaboratively established with patient/family Outcome: Completed/Met   Problem: Self-Care: Goal: Ability to participate in self-care as condition permits will improve Outcome: Completed/Met Goal: Verbalization of feelings and concerns over difficulty with self-care will improve Outcome: Completed/Met Goal: Ability to communicate needs accurately will improve Outcome: Completed/Met   Problem: Nutrition: Goal: Risk of aspiration will decrease Outcome: Completed/Met Goal: Dietary intake will improve Outcome: Completed/Met

## 2022-09-09 NOTE — Progress Notes (Addendum)
PMR Admission Coordinator Pre-Admission Assessment   Patient: James Gibbs is an 67 y.o., male MRN: [id number] DOB: 1956-03-23 Height: 5\' 11"  (180.3 cm) Weight: 102.5 kg   Insurance Information HMO:     PPO:      PCP:      IPA:      80/20:      OTHER:  [facility]      Policy#: [id number]   Subscriber: Pt  CM Name: [name]       Phone#: [phone]      Fax#: [phone]  Received authorization 09/07/28 stating Berkley Harvey for 30 days. Pt. Approved 4/01-0/27/25 Pre-Cert#: [id number]      Employer:  Benefits:  Phone #:      Name:  Dolores Hoose Date: 10/22/2017 - still active      Deduct:       Out of Pocket Max:       Life Max:  CIR:       SNF:  Outpatient:      Co-Pay:  Home Health:       Co-Pay:  DME:      Co-Pay:  Providers: in network     SECONDARY: [employer]      Policy#: [id number]      Phone#:    Financial Counselor:       Phone#:    The "Data Collection Information Summary" for patients in Inpatient Rehabilitation Facilities with attached "Privacy Act Statement-Health Care Records" was provided and verbally reviewed with: Patient   Emergency Contact Information Contact Information       Name Relation Home Work Mobile    Racanelli,Heather Daughter [phone]               Current Medical History  Patient Admitting Diagnosis: MS Pseudo-exacerbation History of Present Illness: James Gibbs is a 67 y.o. 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  The patient was in his usual state of health, the  morning PTA and  he went to the [facility] to have dental implants.  He was feeling well afterwards and returned home. Around 7 PM he could not move his left leg.  States his right foot is always weak.  He could not lift his body off the bed.   States he had no strength and could not walk.  EMS was activated and the patient was brought into the ED for further evaluation. In the ED, febrile with Tmax 102.2, tachycardic with heart rate of 101, tachypneic with respiration rate of 29.  Code sepsis was called in the ED.  Blood cultures were obtained.  The patient was started on broad-spectrum IV antibiotics and IV fluid per sepsis protocol. COVID-19 screening test, RSV, influenza A&B were negative. Respiratory panel was positive for rhinovirus. UA, non contrast CT head, and chest x-ray were nonacute.Patient underwent MRI brain and cervical spine which shows only chronic changes without any acute findings. Pt. Felt to be experiencing weakness due to rhinovirus in the contest of his MS diagnosis. Pt. Was seen by PT/OT and they recommended CIR to assist return to PLOF.     Patient's medical record from [facility] has been reviewed by the rehabilitation admission coordinator and physician.   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 No   Family History   family history includes Hypertension in his father and mother.   Current Medications   Current Facility-Administered Medications:    acetaminophen (TYLENOL) tablet 650 mg, 650 mg, Oral, Q6H PRN, Darlin Drop, DO, 650 mg at 09/04/22 2118   allopurinol (ZYLOPRIM) tablet 300 mg, 300 mg, Oral, Daily, Hall, Carole N, DO, 300 mg at 09/07/22 1008   amLODipine (NORVASC) tablet 10 mg, 10 mg, Oral, Daily, Hall, Carole N, DO, 10 mg at 09/07/22 1008   ascorbic acid (VITAMIN C) tablet 500 mg, 500 mg, Oral, Daily, Hall, Carole N, DO, 500 mg at 09/07/22 1008   atorvastatin (LIPITOR) tablet 80 mg, 80 mg, Oral, QHS, Hall, Carole N, DO, 80 mg at  09/06/22 2142   cholecalciferol (VITAMIN D3) 10 MCG (400 UNIT) tablet 400 Units, 400 Units, Oral, Daily, Darlin Drop, DO, 400 Units at 09/07/22 1008   dalfampridine TB12 10 mg, 10 mg, Oral, Q12H, Hall, Carole N, DO, 10 mg at 09/07/22 1129   Dimethyl Fumarate CPDR 240 mg, 240 mg, Oral, BID, Osvaldo Shipper, MD, 240 mg at 09/07/22 1129   finasteride (PROSCAR) tablet 5 mg, 5 mg, Oral, Daily, Hall, Carole N, DO, 5 mg at 09/07/22 1008   mirtazapine (REMERON) tablet 15 mg, 15 mg, Oral, QHS, Osvaldo Shipper, MD   pantoprazole (PROTONIX) EC tablet 40 mg, 40 mg, Oral, Daily, Hall, Carole N, DO, 40 mg at 09/07/22 1008   phenol (CHLORASEPTIC) mouth spray 1 spray, 1 spray, Mouth/Throat, PRN, Osvaldo Shipper, MD   polyethylene glycol (MIRALAX / GLYCOLAX) packet 17 g, 17 g, Oral, Daily PRN, Dow Adolph N, DO, 17 g at 09/07/22 1030   polyvinyl alcohol (LIQUIFILM TEARS) 1.4 % ophthalmic solution 1 drop, 1 drop, Both Eyes, QID, Hall, Carole N, DO, 1 drop at 09/05/22 2145   prochlorperazine (COMPAZINE) injection 5 mg, 5 mg, Intravenous, Q6H PRN, Margo Aye, Carole N, DO   tamsulosin (FLOMAX) capsule 0.8 mg, 0.8 mg, Oral, Daily, Hall, Carole N, DO, 0.8 mg at 09/07/22 1008   Patients Current Diet:  Diet Order                  Diet - low sodium heart healthy             Diet Heart Room service appropriate? Yes; Fluid consistency: Thin  Diet effective now                         Precautions / Restrictions Precautions Precautions: Fall Precaution Comments: chronic R sided weakness Restrictions Weight Bearing Restrictions: No    Has the patient had 2 or more falls or a fall with injury in the past year? Yes   Prior Activity Level Community (5-7x/wk): Pt was active in the community PTA   Prior Functional Level Self Care: Did the patient need help bathing, dressing, using the toilet or eating? Independent   Indoor Mobility: Did the patient need assistance with walking from room to room (with or  without device)? Independent   Stairs: Did the patient need assistance with internal or external stairs (with or without device)? Independent   Functional Cognition: Did the patient need help planning regular tasks such  as shopping or remembering to take medications? Independent   Patient Information Are you of Hispanic, Latino/a,or Spanish origin?: A. No, not of Hispanic, Latino/a, or Spanish origin What is your race?: A. White Do you need or want an interpreter to communicate with a doctor or health care staff?: 0. No   Patient's Response To:  Health Literacy and Transportation Is the patient able to respond to health literacy and transportation needs?:yes  Health Literacy - How often do you need to have someone help you when you read instructions, pamphlets, or other written material from your doctor or pharmacy?: Never In the past 12 months, has lack of transportation kept you from medical appointments or from getting medications?: No In the past 12 months, has lack of transportation kept you from meetings, work, or from getting things needed for daily living?: No   Home Assistive Devices / Equipment Home Assistive Devices/Equipment: Environmental consultant (specify type) Home Equipment: Rollator (4 wheels), BSC/3in1, Shower seat, Hand held shower head, Grab bars - tub/shower, Other (comment) (upright walker)   Prior Device Use: Indicate devices/aids used by the patient prior to current illness, exacerbation or injury? Walker   Current Functional Level Cognition   Overall Cognitive Status: No family/caregiver present to determine baseline cognitive functioning Orientation Level: Oriented X4 General Comments: slower processing, pleasant, follows directions w/ increased time.    Extremity Assessment (includes Sensation/Coordination)   Upper Extremity Assessment: RUE deficits/detail RUE Deficits / Details: baseline R UE weakness though functional  Lower Extremity Assessment: Defer to PT evaluation      ADLs   Overall ADL's : Needs assistance/impaired Eating/Feeding: Independent Grooming: Set up, Sitting, Wash/dry face Upper Body Bathing: Minimal assistance, Sitting Upper Body Bathing Details (indicate cue type and reason): assist w/ back Lower Body Bathing: Moderate assistance, Sitting/lateral leans, Sit to/from stand, +2 for physical assistance, +2 for safety/equipment Lower Body Bathing Details (indicate cue type and reason): able to bathe reaching down to lower LE sitting EOB. assist for posterior hygiene standing with RW. Then able to assist with anterior peri care standing in [place] Upper Body Dressing : Minimal assistance, Sitting Lower Body Dressing: Maximal assistance, +2 for safety/equipment, +2 for physical assistance, Sitting/lateral leans, Sit to/from stand Toileting- Clothing Manipulation and Hygiene: Maximal assistance, +2 for physical assistance, +2 for safety/equipment, Sit to/from stand, Sitting/lateral lean General ADL Comments: Limited by chronic R weakness and new L sided weakness     Mobility   Overal bed mobility: Needs Assistance Bed Mobility: Supine to Sit Supine to sit: Min assist General bed mobility comments: assist for LE off of bed and lifting trunk     Transfers   Overall transfer level: Needs assistance Equipment used: Rolling walker (2 wheels) Transfers: Sit to/from Stand Sit to Stand: Min guard, Min assist Bed to/from chair/wheelchair/BSC transfer type:: Via Financial planner via Lift Equipment: [employer] transfer comment: Performed x 5 during session with initial min A progressing to min guard.  Requiring cues for upright posture and knees straight     Ambulation / Gait / Stairs / Wheelchair Mobility   Ambulation/Gait Ambulation/Gait assistance: Min guard, +2 safety/equipment Gait Distance (Feet): 50 Feet (50'x2) Assistive device: Rolling walker (2 wheels) Gait Pattern/deviations: Step-to pattern, Decreased stride length, Trunk  flexed General Gait Details: Pt with gradual increase in trunk and knee flexion with gait requiring cues to correct.  Also, cues for RW proximity particularly with turns.  Pt expressed typically uses rollator Gait velocity: decreased     Posture / Balance Dynamic  Sitting Balance Sitting balance - Comments: R lateral drift w/ dynamic tasks Balance Overall balance assessment: Needs assistance Sitting-balance support: No upper extremity supported, Feet supported Sitting balance-Leahy Scale: Fair Sitting balance - Comments: R lateral drift w/ dynamic tasks Postural control: Right lateral lean Standing balance support: Bilateral upper extremity supported, During functional activity, Reliant on assistive device for balance Standing balance-Leahy Scale: Poor     Special needs/care consideration Skin intact and Special service needs contact/droplet precautions    Previous Home Environment (from acute therapy documentation) Living Arrangements: Alone Available Help at Discharge: Family Type of Home: House Home Layout: One level Home Access: Stairs to enter Entrance Stairs-Rails: Can reach both Entrance Stairs-Number of Steps: 3 Bathroom Shower/Tub: Health visitor: Handicapped height Bathroom Accessibility: No Home Care Services: No   Discharge Living Setting Plans for Discharge Living Setting: Patient's home Type of Home at Discharge: House Discharge Home Layout: One level Discharge Home Access: Stairs to enter Entrance Stairs-Rails: Can reach both Entrance Stairs-Number of Steps: 3 Discharge Bathroom Shower/Tub: Walk-in shower Discharge Bathroom Toilet: Handicapped height Discharge Bathroom Accessibility: No Does the patient have any problems obtaining your medications?: No   Social/Family/Support Systems Patient Roles: Other (Comment) Contact Information: [phone] Anticipated Caregiver: Sohail Milder (daughter) Anticipated Caregiver's Contact Information:  Intermittent support, Pt. needs mod I gaols Discharge Plan Discussed with Primary Caregiver: Yes Is Caregiver In Agreement with Plan?: Yes Does Caregiver/Family have Issues with Lodging/Transportation while Pt is in Rehab?: No   Goals Patient/Family Goal for Rehab: PT/OT mod I Expected length of stay: 7-10 days Pt/Family Agrees to Admission and willing to participate: Yes Program Orientation Provided & Reviewed with Pt/Caregiver Including Roles  & Responsibilities: Yes   Decrease burden of Care through IP rehab admission: not anticipated   Possible need for SNF placement upon discharge: not anticipated   Patient Condition: I have reviewed medical records from [facility], spoken with CM, and patient. I met with patient at the bedside for inpatient rehabilitation assessment.  Patient will benefit from ongoing PT and OT, can actively participate in 3 hours of therapy a day 5 days of the week, and can make measurable gains during the admission.  Patient will also benefit from the coordinated team approach during an Inpatient Acute Rehabilitation admission.  The patient will receive intensive therapy as well as Rehabilitation physician, nursing, social worker, and care management interventions.  Due to safety, skin/wound care, disease management, medication administration, pain management, and patient education the patient requires 24 hour a day rehabilitation nursing.  The patient is currently Min A - min g  with mobility and basic ADLs.  Discharge setting and therapy post discharge at home with home health is anticipated.  Patient has agreed to participate in the Acute Inpatient Rehabilitation Program and will admit today.   Preadmission Screen Completed By:  Jeronimo Greaves, 09/07/2022 12:47 PM ______________________________________________________________________   Discussed status with Dr. Berline Chough  on 09/09/22 at 930 and received approval for admission today.   Admission Coordinator:  Jeronimo Greaves,  CCC-SLP, time 1017/Date 09/09/22    Assessment/Plan: Diagnosis: Does the need for close, 24 hr/day Medical supervision in concert with the patient's rehab needs make it unreasonable for this patient to be served in a less intensive setting? Yes Co-Morbidities requiring supervision/potential complications: DM, HTN, gout, RLE weakness; spesis from rhinovirus- MS pseudoexacerbation Due to bladder management, bowel management, safety, skin/wound care, disease management, medication administration, pain management, and patient education, does the patient require 24 hr/day rehab nursing? Yes  Does the patient require coordinated care of a physician, rehab nurse, PT, OT, and SLP to address physical and functional deficits in the context of the above medical diagnosis(es)? Yes Addressing deficits in the following areas: balance, endurance, locomotion, strength, transferring, bowel/bladder control, bathing, dressing, feeding, grooming, and toileting Can the patient actively participate in an intensive therapy program of at least 3 hrs of therapy 5 days a week? Yes The potential for patient to make measurable gains while on inpatient rehab is good Anticipated functional outcomes upon discharge from inpatient rehab: modified independent PT, modified independent OT, n/a SLP Estimated rehab length of stay to reach the above functional goals is: 7-10 days Anticipated discharge destination: Home 10. Overall Rehab/Functional Prognosis: good     MD Signature:

## 2022-09-09 NOTE — H&P (Signed)
Physical Medicine and Rehabilitation Admission H&P    Chief Complaint  Patient presents with   Functional deficits due to MS peudoexacerbation.    HPI: James Gibbs is a 67 year old RH-male with history of MS, LVH, HTN. T2DM, GERD, BPH, MS with right hemiparesis; who was admitted on 09/03/22 with weakness and inability to move his Left leg and inability to get out of bed or walk. He reported having root canal done earlier that day at the [place]. At admission, he was febrile T-102.2 with HR 102 and tachypnic with RR 29. one day history of weakness with inability to get out of bed, cough and being chilled. He reported having cough and was started on broad spectrum antibiotics. CXR, COVID/RSV PCRs and BC negative therefore antibiotics discontinued.  Respiratory culture showed rhinovirus/enterovirus. Hospital course significant for hypokalemia, prolonged QT as well a BLE weakness affecting mobility.  MRI brain and C spine done showing extensive white matter changes due to MS and/or small vessel disease and no abnormal enhancement. Generalized weakness felt to be due to MS pseudoexacerbation and PT/OT has been working with patient who continues to be limited by fatigue, flexed posture with right hemiparesis and cognitive deficits needing redirection. CIR recommended due to functional decline.    Pt reports uses a RW and cane at home- was using RW at [employer]- doesn't want to be here- didn't want to come.  Thinks his cognition is back to baseline, however did describe a nightmare last night and woke up and initially didn't know where he was.   LBM yesterday- usually takes Miralax throughout day and has 3 BM's /day.  Voiding well but has chronic bladder incontinence- doesn't usually wear briefs, but his daughter said would bring some, per pt.  Does feels still a little weaker than normal in RLE.   Denies muscle spasms from MS.    [facility] and PCP- no Rehab doctor.    Review of Systems  Constitutional:  Negative for fever.  HENT:  Negative for hearing loss.   Respiratory:  Positive for cough. Negative for shortness of breath.   Cardiovascular:  Negative for chest pain.  Gastrointestinal:  Negative for constipation.  Genitourinary:  Negative for dysuria.  Musculoskeletal:  Positive for falls (last a year ago in the yard). Negative for myalgias.  Skin:  Negative for rash.  Neurological:  Positive for weakness.  Psychiatric/Behavioral:  The patient is not nervous/anxious and does not have insomnia.   All other systems reviewed and are negative.    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    Malignant hypertension    Mitral valve anterior leaflet prolapse    Mixed hyperlipidemia     Past Surgical History:  Procedure Laterality Date   COLONOSCOPY WITH PROPOFOL N/A 12/17/2018   Procedure: COLONOSCOPY WITH PROPOFOL;  Surgeon: Bernette Redbird, MD;  Location: [facility];  Service: Endoscopy;  Laterality: N/A;   ESOPHAGOGASTRODUODENOSCOPY (EGD) WITH PROPOFOL N/A 12/14/2018   Procedure: ESOPHAGOGASTRODUODENOSCOPY (EGD) MODERATE SEDATION;  Surgeon: Bernette Redbird, MD;  Location: [facility];  Service: Endoscopy;  Laterality: N/A;   GIVENS CAPSULE STUDY N/A 12/18/2018   Procedure: GIVENS CAPSULE STUDY;  Surgeon: Willis Modena, MD;  Location: [facility];  Service: Endoscopy;  Laterality: N/A;   none      Family History  Problem Relation Age of Onset   Hypertension Mother    Hypertension Father     Social History:  Lives  alone and independent PTA. Has help with housework. He  reports that he smoked for 10 years and quit 30+ years ago. Marland Kitchen He has never used smokeless tobacco. He reports that he does not drink alcohol and does not use drugs.   Allergies: No Known Allergies   Medications Prior to Admission  Medication Sig Dispense Refill   acetaminophen (TYLENOL) 650 MG CR tablet Take 650  mg by mouth every 8 (eight) hours as needed for pain.     allopurinol (ZYLOPRIM) 300 MG tablet Take 300 mg by mouth daily.     amLODipine (NORVASC) 10 MG tablet Take 10 mg by mouth daily.     Ascorbic Acid (VITAMIN C) 100 MG tablet Take 500 mg by mouth in the morning and at bedtime.     atorvastatin (LIPITOR) 80 MG tablet Take 80 mg by mouth at bedtime.     cholecalciferol (VITAMIN D) 400 UNITS TABS tablet Take 400 Units by mouth daily.     dalfampridine 10 MG TB12 Take 1 tablet by mouth every 12 (twelve) hours.     Dimethyl Fumarate (TECFIDERA) 240 MG CPDR Take 240 mg by mouth 2 (two) times daily.     finasteride (PROSCAR) 5 MG tablet Take 5 mg by mouth daily.     Hydrochlorothiazide (MICROZIDE PO) Take 25 mg by mouth daily.     lidocaine (LIDODERM) 5 % Place 1 patch onto the skin daily. Remove & Discard patch within 12 hours or as directed by MD     lisinopril (PRINIVIL,ZESTRIL) 40 MG tablet Take 40 mg by mouth daily.     metFORMIN (GLUCOPHAGE-XR) 500 MG 24 hr tablet Take 500 mg by mouth daily.     mirtazapine (REMERON) 15 MG tablet Take 15 mg by mouth at bedtime. May repeat with a quarter of a tablet if you wake up - up to twice a night)     Multiple Vitamin (MULTIVITAMIN WITH MINERALS) TABS tablet Take 1 tablet by mouth daily.     Omega-3 Fatty Acids (FISH OIL) 1000 MG CAPS Take 1,000 mg by mouth daily.     pantoprazole (PROTONIX) 40 MG tablet Take 1 tablet (40 mg total) by mouth daily. (Patient taking differently: Take 40 mg by mouth 2 (two) times daily.) 30 tablet 1   polyethylene glycol (MIRALAX / GLYCOLAX) 17 g packet Take 17 g by mouth 2 (two) times daily. (Patient taking differently: Take 17 g by mouth daily.) 14 each 0   potassium chloride SA (K-DUR) 20 MEQ tablet Take 20 mEq by mouth daily.     senna-docusate (SENOKOT-S) 8.6-50 MG tablet Take 1 tablet by mouth 2 (two) times daily. (Patient taking differently: Take 2 tablets by mouth 2 (two) times daily.)     tamsulosin (FLOMAX) 0.4 MG  CAPS capsule Take 0.8 mg by mouth daily.        Home: Home Living Family/patient expects to be discharged to:: Private residence Living Arrangements: Alone Available Help at Discharge: Family Type of Home: House Home Access: Stairs to enter Secretary/administrator of Steps: 3 Entrance Stairs-Rails: Can reach both Home Layout: One level Bathroom Shower/Tub: Health visitor: Handicapped height Bathroom Accessibility: No Home Equipment: Rollator (4 wheels), BSC/3in1, Shower seat, Hand held shower head, Grab bars - tub/shower, Other (comment) (upright walker) Additional Comments: pt livea alone, has been doing OPPT at [facility] in [place], family/daughter helps him as needed.   Functional History: Prior Function Prior Level of Function : Independent/Modified Independent Mobility Comments: mobilizes with rollator  in the home, uses upright walker in the community ADLs Comments: uses meals on wheels for L/D every day M-F he has cereal in morning and manages as needed on the weekends. reports managing ADLs w/o assist; has medical alert necklace (has access to when showering as well)  Functional Status:  Mobility: Bed Mobility Overal bed mobility: Needs Assistance Bed Mobility: Supine to Sit Supine to sit: HOB elevated, Supervision (Min HHa at trunk after pt tried without.) Sit to supine: Supervision, HOB elevated General bed mobility comments: increased time and use of bed rail Transfers Overall transfer level: Needs assistance Equipment used: Rolling walker (2 wheels) Transfers: Sit to/from Stand Sit to Stand: Min guard Bed to/from chair/wheelchair/BSC transfer type:: Via Financial planner via Lift Equipment: [employer] transfer comment: pt maintains flexed posture with all functional mobility tasks Ambulation/Gait Ambulation/Gait assistance: Min guard (and pt progressed to intermittent S) Gait Distance (Feet): 225 Feet Assistive device: Rollator (4  wheels) Gait Pattern/deviations: Step-to pattern, Decreased stride length, Trunk flexed, Decreased dorsiflexion - right General Gait Details: gait assessed with rollator, flexed posture regardelss of cues noted bounce with gait, limited R LE foot clearance with WB on lateral aspect of foot Gait velocity: decreased    ADL: ADL Overall ADL's : Needs assistance/impaired Eating/Feeding: Independent Grooming: Wash/dry face, Oral care, Wash/dry hands, Standing, Supervision/safety Grooming Details (indicate cue type and reason): Cues for placement of RW. Rests forearms on sink which pt reports is baseline. Upper Body Bathing: Minimal assistance, Sitting Upper Body Bathing Details (indicate cue type and reason): assist w/ back Lower Body Bathing: Moderate assistance, Sitting/lateral leans, Sit to/from stand, +2 for physical assistance, +2 for safety/equipment Lower Body Bathing Details (indicate cue type and reason): able to bathe reaching down to lower LE sitting EOB. assist for posterior hygiene standing with RW. Then able to assist with anterior peri care standing in [place] Upper Body Dressing : Minimal assistance, Sitting Lower Body Dressing: Set up, Bed level, Sitting/lateral leans Lower Body Dressing Details (indicate cue type and reason): Pt able to doff and don socks with increased time. Pt doffed at EOB, then moved to long sitting position on bed to don. Toilet Transfer: Min guard, Rolling walker (2 wheels) Toilet Transfer Details (indicate cue type and reason): Pt stood from EOB to RW with Min guard assist and ambulated a few steps to sink. Pt stood at sink for grooming then pivoted another few steps to recliner. Pt showed good eccentric control with stand to sit. Toileting- Clothing Manipulation and Hygiene: Maximal assistance, +2 for physical assistance, +2 for safety/equipment, Sit to/from stand, Sitting/lateral lean Toileting - Clothing Manipulation Details (indicate cue type and reason):  Pt on condom catheter. Functional mobility during ADLs: Min guard, Supervision/safety, Rolling walker (2 wheels) General ADL Comments: Limited by chronic R weakness and new L sided weakness  Cognition: Cognition Overall Cognitive Status: No family/caregiver present to determine baseline cognitive functioning Orientation Level: Oriented to person, Disoriented to place, Disoriented to time, Disoriented to situation Cognition Arousal/Alertness: Awake/alert Behavior During Therapy: WFL for tasks assessed/performed Overall Cognitive Status: No family/caregiver present to determine baseline cognitive functioning General Comments: Very talkative and needs cues to redirect to task and topic. Very pleasant and likes to engage in discussions.   Blood pressure (!) 141/82, pulse 66, temperature 97.8 F (36.6 C), temperature source Oral, resp. rate (!) 22, height 5\' 11"  (1.803 m), weight 102.5 kg, SpO2 97 %. Physical Exam Vitals and nursing note reviewed.  Constitutional:      Appearance:  Normal appearance.     Comments: Older gentleman, awake, alert, appropriate, slightly irritable, sitting up in bed, NAD  HENT:     Head: Normocephalic and atraumatic.     Comments: No facial droop    Right Ear: External ear normal.     Left Ear: External ear normal.     Nose: Nose normal. No congestion.     Mouth/Throat:     Mouth: Mucous membranes are dry.     Pharynx: Oropharynx is clear. No oropharyngeal exudate.  Eyes:     General:        Right eye: No discharge.        Left eye: No discharge.     Extraocular Movements: Extraocular movements intact.  Cardiovascular:     Rate and Rhythm: Normal rate and regular rhythm.     Heart sounds: Normal heart sounds. No murmur heard.    No gallop.  Pulmonary:     Effort: Pulmonary effort is normal. No respiratory distress.     Breath sounds: Wheezing present. No rhonchi or rales.     Comments: A couple of wheezes heard- slightly coarse, but good air  movement Abdominal:     General: Bowel sounds are normal. There is no distension.     Palpations: Abdomen is soft.     Tenderness: There is no abdominal tenderness.  Genitourinary:    Comments: Genitals look ok- was incontinent of urine slightly.  Musculoskeletal:     Cervical back: Neck supple. No tenderness.     Comments: UE strength 5/5 B/L RLE HF, KE, KF, DF and PF 5-/5- just slightly weaker than L side LLE- 5/5   Skin:    General: Skin is warm and dry.     Comments: Skin looks well on heels, genitals and backside  Neurological:     Mental Status: He is alert and oriented to person, place, and time.     Comments: Ox3 Intact to light touch in all 4 extremities No increased tone felt and Hoffman's (-); no clonus  Psychiatric:     Comments: Slightly irritable, but polite     Results for orders placed or performed during the hospital encounter of 09/03/22 (from the past 48 hour(s))  Glucose, capillary     Status: Abnormal   Collection Time: 09/09/22  4:25 AM  Result Value Ref Range   Glucose-Capillary 104 (H) 70 - 99 mg/dL    Comment: Glucose reference range applies only to samples taken after fasting for at least 8 hours.  Glucose, capillary     Status: None   Collection Time: 09/09/22  7:58 AM  Result Value Ref Range   Glucose-Capillary 86 70 - 99 mg/dL    Comment: Glucose reference range applies only to samples taken after fasting for at least 8 hours.   No results found.    Blood pressure (!) 141/82, pulse 66, temperature 97.8 F (36.6 C), temperature source Oral, resp. rate (!) 22, height  (1.803 m), weight 102.5 kg, SpO2 97 %.  Medical Problem List and Plan: 1. Functional deficits secondary to pseudoexacerbation of MS due to rhinovirus  -patient may  shower  -ELOS/Goals: 7-10 days- mod I- pt doesn't want to "stay long, unhappy he's here".  2.  Antithrombotics: -DVT/anticoagulation:  Pharmaceutical: Lovenox- pt absolutely refuses any type of blood thinner,  however willing to use SCDs  -antiplatelet therapy: N/A 3. Pain Management:  tylenol prn.  4. Mood/Behavior/Sleep: LCSW to follow for evaluation and support.   -antipsychotic agents:  N/A 5. Neuropsych/cognition: This patient appears capable of making decisions on his own behalf. 6. Skin/Wound Care: Routine pressure relief measures.  7. Fluids/Electrolytes/Nutrition: Monitor I/O. Check CMET in am 8. MS  peudoexacerbation: Improving. Continue Dalfampridine and Dimethyl furmarate.  9.  HTN: Monitor BP TID--on proscar and amlodipine.   --continue to hold HCTZ/K dur 10.  T2DM: Hgb A1C- Monitor BS ac/hs--variable and question intake.  --Will continue to hold metformin. 11. H/o BPH: Continue proscar and flomax-->change flomax to evenings to avoid orthostatic changes. 12. Constipation: Continue miralax bid.  13. OSA: Continue CPAP.  14. GERD:Symptoms managed with PPI 15. Thrombocytopenia: Monitor for signs of bleeding. Recheck plts in am.      Jacquelynn Cree, PA-C 09/09/2022  I have personally performed a face to face diagnostic evaluation of this patient and formulated the key components of the plan.  Additionally, I have personally reviewed laboratory data, imaging studies, as well as relevant notes and concur with the physician assistant's documentation above.   The patient's status has not changed from the original H&P.  Any changes in documentation from the acute care chart have been noted above.

## 2022-09-09 NOTE — TOC Progression Note (Signed)
Transition of Care [facility]) - Progression Note    Patient Details  Name: James Gibbs MRN: [id number] Date of Birth: 06/12/1955  [facility] CM/SW Contact  Larrie Kass, LCSW Phone Number: 09/09/2022, 9:20 AM  Clinical Narrative:    Per chart review pt is awaiting CIR insurance auth. TOC to follow for d/c needs.     Expected Discharge Plan: IP Rehab Facility Barriers to Discharge: Insurance Authorization  Expected Discharge Plan and Services In-house Referral: NA Discharge Planning Services: NA Post Acute Care Choice: IP Rehab Living arrangements for the past 2 months: Single Family Home                 DME Arranged: N/A DME Agency: NA                   Social Determinants of Health (SDOH) Interventions SDOH Screenings   Food Insecurity: No Food Insecurity (09/04/2022)  Housing: Low Risk  (09/04/2022)  Transportation Needs: No Transportation Needs (09/04/2022)  Utilities: Not At Risk (09/04/2022)  Tobacco Use: Low Risk  (09/03/2022)    Readmission Risk Interventions    09/06/2022   10:20 AM  Readmission Risk Prevention Plan  Transportation Screening Complete  PCP or Specialist Appt within 5-7 Days Complete  Home Care Screening Complete  Medication Review (RN CM) Complete

## 2022-09-10 DIAGNOSIS — D696 Thrombocytopenia, unspecified: Secondary | ICD-10-CM

## 2022-09-10 DIAGNOSIS — I10 Essential (primary) hypertension: Secondary | ICD-10-CM | POA: Diagnosis not present

## 2022-09-10 DIAGNOSIS — E876 Hypokalemia: Secondary | ICD-10-CM

## 2022-09-10 DIAGNOSIS — G35 Multiple sclerosis: Secondary | ICD-10-CM | POA: Diagnosis not present

## 2022-09-10 DIAGNOSIS — E119 Type 2 diabetes mellitus without complications: Secondary | ICD-10-CM

## 2022-09-10 DIAGNOSIS — K5900 Constipation, unspecified: Secondary | ICD-10-CM

## 2022-09-10 LAB — CBC WITH DIFFERENTIAL/PLATELET
Abs Immature Granulocytes: 0.01 10*3/uL (ref 0.00–0.07)
Basophils Absolute: 0 10*3/uL (ref 0.0–0.1)
Basophils Relative: 0 %
Eosinophils Absolute: 0.2 10*3/uL (ref 0.0–0.5)
Eosinophils Relative: 4 %
HCT: 35.8 % — ABNORMAL LOW (ref 39.0–52.0)
Hemoglobin: 12.7 g/dL — ABNORMAL LOW (ref 13.0–17.0)
Immature Granulocytes: 0 %
Lymphocytes Relative: 20 %
Lymphs Abs: 1 10*3/uL (ref 0.7–4.0)
MCH: 32.9 pg (ref 26.0–34.0)
MCHC: 35.5 g/dL (ref 30.0–36.0)
MCV: 92.7 fL (ref 80.0–100.0)
Monocytes Absolute: 0.4 10*3/uL (ref 0.1–1.0)
Monocytes Relative: 8 %
Neutro Abs: 3.4 10*3/uL (ref 1.7–7.7)
Neutrophils Relative %: 68 %
Platelets: 155 10*3/uL (ref 150–400)
RBC: 3.86 MIL/uL — ABNORMAL LOW (ref 4.22–5.81)
RDW: 13.6 % (ref 11.5–15.5)
WBC: 5.1 10*3/uL (ref 4.0–10.5)
nRBC: 0 % (ref 0.0–0.2)

## 2022-09-10 LAB — COMPREHENSIVE METABOLIC PANEL
ALT: 61 U/L — ABNORMAL HIGH (ref 0–44)
AST: 37 U/L (ref 15–41)
Albumin: 3.1 g/dL — ABNORMAL LOW (ref 3.5–5.0)
Alkaline Phosphatase: 79 U/L (ref 38–126)
Anion gap: 7 (ref 5–15)
BUN: 12 mg/dL (ref 8–23)
CO2: 27 mmol/L (ref 22–32)
Calcium: 8.2 mg/dL — ABNORMAL LOW (ref 8.9–10.3)
Chloride: 104 mmol/L (ref 98–111)
Creatinine, Ser: 0.9 mg/dL (ref 0.61–1.24)
GFR, Estimated: >60 mL/min (ref >60)
Glucose, Bld: 105 mg/dL — ABNORMAL HIGH (ref 70–99)
Potassium: 3.3 mmol/L — ABNORMAL LOW (ref 3.5–5.1)
Sodium: 138 mmol/L (ref 135–145)
Total Bilirubin: 1.2 mg/dL (ref 0.3–1.2)
Total Protein: 5.8 g/dL — ABNORMAL LOW (ref 6.5–8.1)

## 2022-09-10 LAB — GLUCOSE, CAPILLARY
Glucose-Capillary: 101 mg/dL — ABNORMAL HIGH (ref 70–99)
Glucose-Capillary: 112 mg/dL — ABNORMAL HIGH (ref 70–99)
Glucose-Capillary: 147 mg/dL — ABNORMAL HIGH (ref 70–99)
Glucose-Capillary: 161 mg/dL — ABNORMAL HIGH (ref 70–99)

## 2022-09-10 MED ORDER — POTASSIUM CHLORIDE CRYS ER 20 MEQ PO TBCR
40.0000 meq | EXTENDED_RELEASE_TABLET | Freq: Once | ORAL | Status: AC
  Administered 2022-09-10: 40 meq via ORAL
  Filled 2022-09-10: qty 2

## 2022-09-10 MED ORDER — LISINOPRIL 5 MG PO TABS
10.0000 mg | ORAL_TABLET | Freq: Every day | ORAL | Status: DC
  Administered 2022-09-10 – 2022-09-16 (×7): 10 mg via ORAL
  Filled 2022-09-10 (×7): qty 2

## 2022-09-10 NOTE — Evaluation (Signed)
Occupational Therapy Assessment and Plan  Patient Details  Name: James Gibbs MRN: [id number] Date of Birth: Aug 11, 1955  OT Diagnosis: muscle weakness (generalized) Rehab Potential: Rehab Potential (ACUTE ONLY): Good ELOS: 7-10 days   Today's Date: 09/10/2022 OT Individual Time: 4098-1191 OT Individual Time Calculation (min): 75 min     Hospital Problem: Principal Problem:   Multiple sclerosis Active Problems:   Generalized weakness   Past Medical History:  Past Medical History:  Diagnosis Date   Diabetes    Left ventricular hypertrophy due to hypertensive disease    Malignant hypertension    Mitral valve anterior leaflet prolapse    Mixed hyperlipidemia    Past Surgical History:  Past Surgical History:  Procedure Laterality Date   COLONOSCOPY WITH PROPOFOL N/A 12/17/2018   Procedure: COLONOSCOPY WITH PROPOFOL;  Surgeon: Bernette Redbird, MD;  Location: [facility];  Service: Endoscopy;  Laterality: N/A;   ESOPHAGOGASTRODUODENOSCOPY (EGD) WITH PROPOFOL N/A 12/14/2018   Procedure: ESOPHAGOGASTRODUODENOSCOPY (EGD) MODERATE SEDATION;  Surgeon: Bernette Redbird, MD;  Location: [facility];  Service: Endoscopy;  Laterality: N/A;   GIVENS CAPSULE STUDY N/A 12/18/2018   Procedure: GIVENS CAPSULE STUDY;  Surgeon: Willis Modena, MD;  Location: [facility];  Service: Endoscopy;  Laterality: N/A;   none      Assessment & Plan Clinical Impression: James Gibbs is a 67 year old RH-male with history of MS, LVH, HTN. T2DM, GERD, BPH, MS with right hemiparesis; who was admitted on 09/03/22 with weakness and inability to move his Left leg and inability to get out of bed or walk. He reported having root canal done earlier that day at the [facility]. At admission, he was febrile T-102.2 with HR 102 and tachypnic with RR 29. one day history of weakness with inability to get out of bed, cough and being chilled. He reported having cough and was started on broad spectrum antibiotics. CXR,  COVID/RSV PCRs and BC negative therefore antibiotics discontinued. Respiratory culture showed rhinovirus/enterovirus. Hospital course significant for hypokalemia, prolonged QT as well a BLE weakness affecting mobility. MRI brain and C spine done showing extensive white matter changes due to MS and/or small vessel disease and no abnormal enhancement. Generalized weakness felt to be due to MS pseudoexacerbation and PT/OT has been working with patient who continues to be limited by fatigue, flexed posture with right hemiparesis and cognitive deficits needing redirection. CIR recommended due to functional decline.   Patient transferred to CIR on 09/09/2022 .    Patient currently requires min with basic self-care skills secondary to muscle weakness, decreased cardiorespiratoy endurance, and decreased standing balance, decreased postural control, and decreased balance strategies.  Prior to hospitalization, patient could complete ADLs with modified independent .  Patient will benefit from skilled intervention to decrease level of assist with basic self-care skills prior to discharge home independently.  Anticipate patient will require intermittent supervision and follow up home health.  OT - End of Session Activity Tolerance: Tolerates 30+ min activity without fatigue Endurance Deficit: Yes Endurance Deficit Description: Generalized deconditioning- moderate to mild OT Assessment Rehab Potential (ACUTE ONLY): Good OT Barriers to Discharge: Decreased caregiver support OT Barriers to Discharge Comments: intermittent family support/supervision OT Patient demonstrates impairments in the following area(s): Balance;Safety;Endurance;Motor OT Basic ADL's Functional Problem(s): Bathing;Dressing;Toileting OT Transfers Functional Problem(s): Toilet;Tub/Shower OT Additional Impairment(s): None OT Plan OT Intensity: Minimum of 1-2 x/day, 45 to 90 minutes OT Frequency: 5 out of 7 days OT Duration/Estimated Length of  Stay: 7-10 days OT Treatment/Interventions: Balance/vestibular training;Pain management;Discharge planning;Therapeutic Activities;Self  Care/advanced ADL retraining;UE/LE Coordination activities;Functional mobility training;Disease mangement/prevention;Patient/family education;Therapeutic Exercise;Community reintegration;DME/adaptive equipment instruction;Psychosocial support;UE/LE Strength taining/ROM OT Self Feeding Anticipated Outcome(s): no goal OT Basic Self-Care Anticipated Outcome(s): mod I OT Toileting Anticipated Outcome(s): mod I OT Bathroom Transfers Anticipated Outcome(s): mod I OT Recommendation Patient destination: Home Follow Up Recommendations: Outpatient OT;Home health OT (pt deciding) Equipment Recommended: To be determined   OT Evaluation Precautions/Restrictions  Precautions Precautions: Fall Precaution Comments: chronic R sided weakness Restrictions Weight Bearing Restrictions: No General Chart Reviewed: Yes Family/Caregiver Present: No Home Living/Prior Functioning Home Living Available Help at Discharge: Family Type of Home: House Home Access: Stairs to enter Secretary/administrator of Steps: 3 Entrance Stairs-Rails: Can reach both Home Layout: One level Bathroom Shower/Tub: Health visitor: Handicapped height Additional Comments: pt livea alone, has been doing OPPT at [facility] in [place], family/daughter helps him as needed.  Lives With: Alone IADL History Homemaking Responsibilities: Yes Meal Prep Responsibility: Secondary Laundry Responsibility: Secondary Cleaning Responsibility: Secondary Bill Paying/Finance Responsibility: Secondary Current License: Yes Mode of Transportation: Car Occupation: Retired Prior Function Level of Independence: Requires assistive device for independence, Independent with basic ADLs, Independent with homemaking with wheelchair Vision Baseline Vision/History: 1 Wears glasses Ability to See in Adequate  Light: 0 Adequate Patient Visual Report: No change from baseline Vision Assessment?: No apparent visual deficits Perception  Perception: Within Functional Limits Praxis Praxis: Intact Cognition Cognition Overall Cognitive Status: No family/caregiver present to determine baseline cognitive functioning Arousal/Alertness: Awake/alert Orientation Level: Person;Place;Situation Person: Oriented Place: Oriented Situation: Oriented Memory: Appears intact Awareness: Appears intact Problem Solving: Appears intact Safety/Judgment: Appears intact Brief Interview for Mental Status (BIMS) Repetition of Three Words (First Attempt): 3 Temporal Orientation: Year: Correct Temporal Orientation: Month: Accurate within 5 days Temporal Orientation: Day: Correct Recall: "Sock": Yes, no cue required Recall: "Blue": Yes, no cue required Recall: "Bed": No, could not recall BIMS Summary Score: 13 Sensation Sensation Light Touch: Appears Intact Hot/Cold: Appears Intact Coordination Gross Motor Movements are Fluid and Coordinated: No Fine Motor Movements are Fluid and Coordinated: Yes Coordination and Movement Description: ataxic with hunched over posture- pt unable to fully describe if this is baseline or not Motor  Motor Motor: Abnormal postural alignment and control;Ataxia  Trunk/Postural Assessment  Cervical Assessment Cervical Assessment: Exceptions to [facility] (forward head) Thoracic Assessment Thoracic Assessment: Exceptions to [facility] (rounded shoulders with kyphotic posture when walking) Lumbar Assessment Lumbar Assessment: Within Functional Limits Postural Control Postural Control: Deficits on evaluation Righting Reactions: inadequate  Balance Balance Balance Assessed: Yes Static Sitting Balance Static Sitting - Balance Support: Feet supported Static Sitting - Level of Assistance: 6: Modified independent (Device/Increase time) Dynamic Sitting Balance Dynamic Sitting - Balance Support:  Feet supported Dynamic Sitting - Level of Assistance: 5: Stand by assistance Static Standing Balance Static Standing - Balance Support: During functional activity Static Standing - Level of Assistance: 5: Stand by assistance Dynamic Standing Balance Dynamic Standing - Balance Support: During functional activity Dynamic Standing - Level of Assistance: 4: Min assist Extremity/Trunk Assessment RUE Assessment RUE Assessment: Within Functional Limits General Strength Comments: reports baseline weakness but is fully functional LUE Assessment LUE Assessment: Within Functional Limits  Care Tool Care Tool Self Care Eating   Eating Assist Level: Independent    Oral Care    Oral Care Assist Level: Supervision/Verbal cueing    Bathing   Body parts bathed by patient: Right arm;Left arm;Chest;Right lower leg;Left lower leg;Face;Abdomen;Front perineal area;Buttocks;Right upper leg;Left upper leg     Assist Level: Minimal Assistance - Patient >  75%    Upper Body Dressing(including orthotics)   What is the patient wearing?: Pull over shirt   Assist Level: Supervision/Verbal cueing    Lower Body Dressing (excluding footwear)   What is the patient wearing?: Underwear/pull up;Pants Assist for lower body dressing: Minimal Assistance - Patient > 75%    Putting on/Taking off footwear   What is the patient wearing?: Non-skid slipper socks Assist for footwear: Minimal Assistance - Patient > 75%       Care Tool Toileting Toileting activity   Assist for toileting: Minimal Assistance - Patient > 75%     Care Tool Bed Mobility Roll left and right activity   Roll left and right assist level: Supervision/Verbal cueing    Sit to lying activity   Sit to lying assist level: Supervision/Verbal cueing    Lying to sitting on side of bed activity   Lying to sitting on side of bed assist level: the ability to move from lying on the back to sitting on the side of the bed with no back support.:  Supervision/Verbal cueing     Care Tool Transfers Sit to stand transfer   Sit to stand assist level: Minimal Assistance - Patient > 75%    Chair/bed transfer   Chair/bed transfer assist level: Minimal Assistance - Patient > 75%     Toilet transfer   Assist Level: Minimal Assistance - Patient > 75%     Care Tool Cognition  Expression of Ideas and Wants Expression of Ideas and Wants: 4. Without difficulty (complex and basic) - expresses complex messages without difficulty and with speech that is clear and easy to understand  Understanding Verbal and Non-Verbal Content Understanding Verbal and Non-Verbal Content: 4. Understands (complex and basic) - clear comprehension without cues or repetitions   Memory/Recall Ability Memory/Recall Ability : That he or she is in a hospital/hospital unit;Location of own room;Current season   Refer to Care Plan for Long Term Goals  SHORT TERM GOAL WEEK 1 OT Short Term Goal 1 (Week 1): STG= LTG d/ ELOS  Recommendations for other services: None    Skilled Therapeutic Intervention ADL ADL Eating: Independent Where Assessed-Eating: Bed level Grooming: Supervision/safety Where Assessed-Grooming: Sitting at sink Upper Body Bathing: Supervision/safety Where Assessed-Upper Body Bathing: Shower Lower Body Bathing: Minimal assistance Where Assessed-Lower Body Bathing: Shower Upper Body Dressing: Supervision/safety Where Assessed-Upper Body Dressing: Edge of bed Lower Body Dressing: Minimal assistance Where Assessed-Lower Body Dressing: Edge of bed Toileting: Minimal assistance Where Assessed-Toileting: Teacher, adult education: Curator Method: Proofreader: Gaffer: Information systems manager with back Film/video editor: Insurance underwriter Method: Ambulating Mobility  Bed Mobility Bed Mobility: Supine to Sit;Sit to Supine Supine to Sit: Supervision/Verbal  cueing Sit to Supine: Supervision/Verbal cueing Transfers Sit to Stand: Minimal Assistance - Patient > 75% Stand to Sit: Minimal Assistance - Patient > 75%   Skilled OT evaluation completed with the creation of pt centered OT POC. Pt educated on condition, ELOS, rehab expectations, and fall risk reduction strategies throughout session. ADLs completed at shower level as described above. He completed two trials of 125 ft of functional mobility with the rollator, intermittent R foot toe catching with min A to recover. He was left supine with all needs met, bed alarm set.    Discharge Criteria: Patient will be discharged from OT if patient refuses treatment 3 consecutive times without medical reason, if treatment goals not met, if there is a change in medical  status, if patient makes no progress towards goals or if patient is discharged from hospital.  The above assessment, treatment plan, treatment alternatives and goals were discussed and mutually agreed upon: by patient  Crissie Reese 09/10/2022, 9:09 AM

## 2022-09-10 NOTE — Progress Notes (Signed)
Inpatient Rehabilitation  Patient information reviewed and entered into eRehab system by Ece Cumberland Raya Mckinstry, OTR/L, Rehab Quality Coordinator.   Information including medical coding, functional ability and quality indicators will be reviewed and updated through discharge.   

## 2022-09-10 NOTE — Progress Notes (Signed)
Patient ID: James Gibbs, male   DOB: 1955/06/05, 67 y.o.   MRN: [id number] Met with the patient to review current situation, rehab process, team conference and plan of care. Discussed droplet precautions, infection prevention and MS medications.  Reported urinary incontinence PTA; on flomax and proscar.  No other concerns noted at present. Continue to follow along to address educational needs to facilitate preparation for discharge. Pamelia Hoit

## 2022-09-10 NOTE — Patient Care Conference (Signed)
Inpatient RehabilitationTeam Conference and Plan of Care Update Date: 09/10/2022   Time: 12:10 PM    Patient Name: James Gibbs      Medical Record Number: [id number]  Date of Birth: [birth year] Sex: Male         Room/Bed: 4M12C/4M12C-01 Payor Info: Payor: [employer] / Plan: [facility] / Product Type: *No Product type* /    Admit Date/Time:  09/09/2022  3:47 PM  Primary Diagnosis:  Multiple sclerosis  Hospital Problems: Principal Problem:   Multiple sclerosis Active Problems:   Generalized weakness    Expected Discharge Date: Expected Discharge Date: 09/20/22  Team Members Present: Physician leading conference: Dr. Fanny Dance Social Worker Present: Dossie Der, LCSW Nurse Present: Chana Bode, RN PT Present: Other (comment) Ambrose Finland, PT) OT Present: Jake Shark, OT PPS Coordinator present : Fae Pippin, SLP     Current Status/Progress Goal Weekly Team Focus  Bowel/Bladder   Patient is continent, but incontinent at times due to loss of bladder control.   Patient to be able to request help with voiding.   Assess need to void. Bladder scan every six hours as ordered.    Swallow/Nutrition/ Hydration               ADL's   (S) UB, CGA-min A LB, CGA- min A transfers with the rollator   mod I   dynamic balance, fall risk reduction, d/c planning, ADL, generalized endurance    Mobility   bed mobility supervision, sit<>stand, stand pivot min A, ambulation x5 feet no device mod A   mod I  general strengthening, gait training, increase activity tolerance.    Communication                Safety/Cognition/ Behavioral Observations               Pain   Patient denied pain.   Patient to remain fre of pain.   Pain assessment every shift and PRN.    Skin   Patient's skin is intact.   Skin to remain intact.  Assess skin every shift and PRN.      Discharge Planning:  New evaluation according to the chart will  need to be mod/i home alone will have intermittent assist only.   Team Discussion: Patient admitted with pseudo-exacerbation of MS. Patient incontinent of bladder PTA on flomax and proscar; having loose stools.   Patient on target to meet rehab goals: yes, currently needs supervision for upper body care and CGA for lower body care. Completes sit - stand and stand pivot transfers with min assist and needs mod assist to ambulate up to 5' without adaptive equipment and needs min assist using a rollator.  Goals for discharge set for mod I overall.  *See Care Plan and progress notes for long and short-term goals.   Revisions to Treatment Plan:  PVR with bladder scans prior to addition of medication for bladder management   Teaching Needs: Safety, medications, dietary modifications, toileting, transfers, etc.   Current Barriers to Discharge: Home enviroment access/layout, Incontinence, and Lack of/limited family support. Friends provide transportation to appointments and uses [employer] for transport to and from church.  Possible Resolutions to Barriers: HH follow up services DME; pending     Medical Summary Current Status: MS, rhinovirus, HTN, DM2, BPH, hypokalemia,  Barriers to Discharge: Incontinence;Medical stability;Self-care education  Barriers to Discharge Comments: MS, rhinovirus, HTN, DM2, BPH, hypokalemia Possible Resolutions to [employer] Focus: adjust BP medications, potassium replacement, monitor  lytes, monitor CBGs   Continued Need for Acute Rehabilitation Level of Care: The patient requires daily medical management by a physician with specialized training in physical medicine and rehabilitation for the following reasons: Direction of a multidisciplinary physical rehabilitation program to maximize functional independence : Yes Medical management of patient stability for increased activity during participation in an intensive rehabilitation regime.: Yes Analysis of  laboratory values and/or radiology reports with any subsequent need for medication adjustment and/or medical intervention. : Yes   I attest that I was present, lead the team conference, and concur with the assessment and plan of the team.   Chana Bode B 09/10/2022, 3:14 PM

## 2022-09-10 NOTE — Progress Notes (Signed)
Pt placed on CPAP for bed. RT will cont to monitor as needed.  

## 2022-09-10 NOTE — Progress Notes (Signed)
Inpatient Rehabilitation Care Coordinator Assessment and Plan Patient Details  Name: James Gibbs MRN: [id number] Date of Birth: 03/07/1956  Today's Date: 09/10/2022  Hospital Problems: Principal Problem:   Multiple sclerosis Active Problems:   Generalized weakness  Past Medical History:  Past Medical History:  Diagnosis Date   Diabetes    Left ventricular hypertrophy due to hypertensive disease    Malignant hypertension    Mitral valve anterior leaflet prolapse    Mixed hyperlipidemia    Past Surgical History:  Past Surgical History:  Procedure Laterality Date   COLONOSCOPY WITH PROPOFOL N/A 12/17/2018   Procedure: COLONOSCOPY WITH PROPOFOL;  Surgeon: Bernette Redbird, MD;  Location: [facility];  Service: Endoscopy;  Laterality: N/A;   ESOPHAGOGASTRODUODENOSCOPY (EGD) WITH PROPOFOL N/A 12/14/2018   Procedure: ESOPHAGOGASTRODUODENOSCOPY (EGD) MODERATE SEDATION;  Surgeon: Bernette Redbird, MD;  Location: [facility];  Service: Endoscopy;  Laterality: N/A;   GIVENS CAPSULE STUDY N/A 12/18/2018   Procedure: GIVENS CAPSULE STUDY;  Surgeon: Willis Modena, MD;  Location: [facility];  Service: Endoscopy;  Laterality: N/A;   none     Social History:  reports that he has never smoked. He has never used smokeless tobacco. He reports that he does not drink alcohol and does not use drugs.  Family / Support Systems Marital Status: Widow/Widower Patient Roles: Parent, Other (Comment) (veteran and friend) Children: Heather-daughter [phone] Other Supports: Friends Anticipated Caregiver: Heather Ability/Limitations of Caregiver: not able to proivide care only can provide intermittent assist-works Caregiver Availability: Intermittent Family Dynamics: Close with daughter and friends he attends church and has church members who will check on him and make sure he has what he needs  Social History Preferred language: English Religion: [employer] Cultural Background: No  issues Education: HS Health Literacy - How often do you need to have someone help you when you read instructions, pamphlets, or other written material from your doctor or pharmacy?: Never Writes: Yes Employment Status: Retired Marine scientist Issues: No issues Guardian/Conservator: none-according to MD pt is capable of making his own decisions while here   Abuse/Neglect Abuse/Neglect Assessment Can Be Completed: Yes Physical Abuse: Denies Verbal Abuse: Denies Sexual Abuse: Denies Exploitation of patient/patient's resources: Denies Self-Neglect: Denies  Patient response to: Social Isolation - How often do you feel lonely or isolated from those around you?: Never  Emotional Status Pt's affect, behavior and adjustment status: Pt is motivated to get back to his independent level he was doing everything for himself and driving. He does have some assist with home management-cleaning. Recent Psychosocial Issues: other health issues-MS, diabetes, etc Psychiatric History: No issues seems to be coping appropriately and doing well. He needs to get stronger while here Substance Abuse History: No issues  Patient / Family Perceptions, Expectations & Goals Pt/Family understanding of illness & functional limitations: Pt is able to explain his MS and other diagnoses, he talks with the MD daily while rounding and feels has a good understanding of his treatment plan moving forward. He is recovring from the rhino virus he was admitted for Premorbid pt/family roles/activities: father, retiree, veteran, church member, friend, etc Anticipated changes in roles/activities/participation: resume Pt/family expectations/goals: Pt states: " I plan to be independent when I leave here I have to be, I am alone."  [employer]: Other (Comment) ([facility] [place] Premorbid Home Care/DME Agencies: Other (Comment) (rollator, tall standup walker, bsc, tub seat from  [facility]) Transportation available at discharge: self Is the patient able to respond to transportation needs?: Yes In  the past 12 months, has lack of transportation kept you from medical appointments or from getting medications?: No In the past 12 months, has lack of transportation kept you from meetings, work, or from getting things needed for daily living?: No  Discharge Planning Living Arrangements: Alone Support Systems: Children, Church/faith community Type of Residence: Private residence Insurance Resources: Media planner (specify) ([facility]) Financial Resources: Social Security Financial Screen Referred: No Living Expenses: Own Money Management: Patient Does the patient have any problems obtaining your medications?: No Home Management: has assist with cleaning does own cooking Patient/Family Preliminary Plans: Return home independently with daughter checking in on him. He will need to be mod/i to go home safely alone. Will await therapy evaluations and work on discharge needs. Care Coordinator Barriers to Discharge: Lack of/limited family support Care Coordinator Anticipated Follow Up Needs: HH/OP  Clinical Impression Pleasant gentleman who is motivated to dow ell and regain his independence while here. He is going home alone so will need to be mod/I by discharge. Daughter to check on him at home. Will await therapy evaluations  Lucy Chris 09/10/2022, 10:09 AM

## 2022-09-10 NOTE — Plan of Care (Signed)
  Problem: RH Balance Goal: LTG Patient will maintain dynamic standing balance (PT) Description: LTG:  Patient will maintain dynamic standing balance with assistance during mobility activities (PT) Flowsheets (Taken 09/10/2022 1638) LTG: Pt will maintain dynamic standing balance during mobility activities with:: Independent with assistive device    Problem: Sit to Stand Goal: LTG:  Patient will perform sit to stand with assistance level (PT) Description: LTG:  Patient will perform sit to stand with assistance level (PT) Flowsheets (Taken 09/10/2022 1638) LTG: PT will perform sit to stand in preparation for functional mobility with assistance level: Independent with assistive device   Problem: RH Bed Mobility Goal: LTG Patient will perform bed mobility with assist (PT) Description: LTG: Patient will perform bed mobility with assistance, with/without cues (PT). Flowsheets (Taken 09/10/2022 1638) LTG: Pt will perform bed mobility with assistance level of: Independent   Problem: RH Bed to Chair Transfers Goal: LTG Patient will perform bed/chair transfers w/assist (PT) Description: LTG: Patient will perform bed to chair transfers with assistance (PT). Flowsheets (Taken 09/10/2022 1638) LTG: Pt will perform Bed to Chair Transfers with assistance level: Independent with assistive device    Problem: RH Car Transfers Goal: LTG Patient will perform car transfers with assist (PT) Description: LTG: Patient will perform car transfers with assistance (PT). Flowsheets (Taken 09/10/2022 1638) LTG: Pt will perform car transfers with assist:: Independent with assistive device    Problem: RH Stairs Goal: LTG Patient will ambulate up and down stairs w/assist (PT) Description: LTG: Patient will ambulate up and down # of stairs with assistance (PT) Flowsheets (Taken 09/10/2022 1638) LTG: Pt will ambulate up/down stairs assist needed:: Independent with assistive device

## 2022-09-10 NOTE — Progress Notes (Signed)
[facility]patient Rehabilitation Center Individual Statement of Services  Patient Name:  James Gibbs  Date:  09/10/2022  Welcome to the Inpatient Rehabilitation Center.  Our goal is to provide you with an individualized program based on your diagnosis and situation, designed to meet your specific needs.  With this comprehensive rehabilitation program, you will be expected to participate in at least 3 hours of rehabilitation therapies Monday-Friday, with modified therapy programming on the weekends.  Your rehabilitation program will include the following services:  Physical Therapy (PT), Occupational Therapy (OT), 24 hour per day rehabilitation nursing, Therapeutic Recreaction (TR), Care Coordinator, Rehabilitation Medicine, Nutrition Services, and Pharmacy Services  Weekly team conferences will be held on Wednesday to discuss your progress.  Your Inpatient Rehabilitation Care Coordinator will talk with you frequently to get your input and to update you on team discussions.  Team conferences with you and your family in attendance may also be held.  Expected length of stay: 7-10 days  Overall anticipated outcome: MOD/I LEVEL  Depending on your progress and recovery, your program may change. Your Inpatient Rehabilitation Care Coordinator will coordinate services and will keep you informed of any changes. Your Inpatient Rehabilitation Care Coordinator's name and contact numbers are listed  below.  The following services may also be recommended but are not provided by the Inpatient Rehabilitation Center:  Driving Evaluations Home Health Rehabiltiation Services Outpatient Rehabilitation Services    Arrangements will be made to provide these services after discharge if needed.  Arrangements include referral to agencies that provide these services.  Your insurance has been verified to be:  [employer] Your primary doctor is:  Velna Hatchet Tapp  Pertinent information will be shared with your doctor and  your insurance company.  Inpatient Rehabilitation Care Coordinator:  Dossie Der, [place] [phone] or Luna Glasgow  Information discussed with and copy given to patient by: Lucy Chris, 09/10/2022, 10:11 AM

## 2022-09-10 NOTE — Evaluation (Addendum)
Physical Therapy Assessment and Plan  Patient Details  Name: James Gibbs MRN: [id number] Date of Birth: 03-May-1956  PT Diagnosis: Abnormal posture, Abnormality of gait, Ataxia, Ataxic gait, Difficulty walking, Low back pain, and Muscle weakness Rehab Potential: Good ELOS: 10 days   Today's Date: 09/10/2022 PT Individual Time: 1100-1157, 1416-1530 PT Individual Time Calculation (min): 57 min , 74 min   Hospital Problem: Principal Problem:   Multiple sclerosis Active Problems:   Generalized weakness   Past Medical History:  Past Medical History:  Diagnosis Date   Diabetes    Left ventricular hypertrophy due to hypertensive disease    Malignant hypertension    Mitral valve anterior leaflet prolapse    Mixed hyperlipidemia    Past Surgical History:  Past Surgical History:  Procedure Laterality Date   COLONOSCOPY WITH PROPOFOL N/A 12/17/2018   Procedure: COLONOSCOPY WITH PROPOFOL;  Surgeon: Bernette Redbird, MD;  Location: [facility];  Service: Endoscopy;  Laterality: N/A;   ESOPHAGOGASTRODUODENOSCOPY (EGD) WITH PROPOFOL N/A 12/14/2018   Procedure: ESOPHAGOGASTRODUODENOSCOPY (EGD) MODERATE SEDATION;  Surgeon: Bernette Redbird, MD;  Location: [facility];  Service: Endoscopy;  Laterality: N/A;   GIVENS CAPSULE STUDY N/A 12/18/2018   Procedure: GIVENS CAPSULE STUDY;  Surgeon: Willis Modena, MD;  Location: [facility];  Service: Endoscopy;  Laterality: N/A;   none      Assessment & Plan Clinical Impression: Patient is a 67 y.o. year old male with history of MS, LVH, HTN. T2DM, GERD, BPH, MS with right hemiparesis; who was admitted on 09/03/22 with weakness and inability to move his Left leg and inability to get out of bed or walk. He reported having root canal done earlier that day at the [place]. At admission, he was febrile T-102.2 with HR 102 and tachypnic with RR 29. one day history of weakness with inability to get out of bed, cough and being chilled. He reported having cough  and was started on broad spectrum antibiotics. CXR, COVID/RSV PCRs and BC negative therefore antibiotics discontinued.  Respiratory culture showed rhinovirus/enterovirus. Hospital course significant for hypokalemia, prolonged QT as well a BLE weakness affecting mobility.  MRI brain and C spine done showing extensive white matter changes due to MS and/or small vessel disease and no abnormal enhancement. Generalized weakness felt to be due to MS pseudoexacerbation and PT/OT has been working with patient who continues to be limited by fatigue, flexed posture with right hemiparesis and cognitive deficits needing redirection. CIR recommended due to functional decline.      Patient currently requires min with mobility secondary to muscle weakness and muscle joint tightness, decreased cardiorespiratoy endurance, unbalanced muscle activation, ataxia, decreased coordination, and decreased motor planning, and decreased standing balance and decreased balance strategies.  Prior to hospitalization, patient was modified independent  with mobility and lived with Alone in a House home.  Home access is 3Stairs to enter.  Patient will benefit from skilled PT intervention to maximize safe functional mobility, minimize fall risk, and decrease caregiver burden for planned discharge home with intermittent supervision  Anticipate patient will benefit from follow up [facility] at discharge.  PT - End of Session Activity Tolerance: Tolerates 30+ min activity with multiple rests Endurance Deficit: Yes Endurance Deficit Description: Generalized deconditioning PT Assessment Rehab Potential (ACUTE/IP ONLY): Good PT Barriers to Discharge: Decreased caregiver support;Incontinence;Lack of/limited family support PT Barriers to Discharge Comments: pain, ataxia PT Patient demonstrates impairments in the following area(s): Balance;Endurance;Pain PT Transfers Functional Problem(s): Bed Mobility;Bed to Chair;Car PT Locomotion Functional  Problem(s): Ambulation PT Plan  PT Intensity: Minimum of 1-2 x/day ,45 to 90 minutes PT Frequency: 5 out of 7 days PT Duration Estimated Length of Stay: 10 days PT Treatment/Interventions: Ambulation/gait training;Discharge planning;Functional mobility training PT Transfers Anticipated Outcome(s): mod I PT Locomotion Anticipated Outcome(s): mod I PT Recommendation Follow Up Recommendations: Home health PT Patient destination: Home Equipment Recommended: To be determined Equipment Details: per pt, pt has rollator, SPC, and platform walker   PT Evaluation Precautions/Restrictions Precautions Precautions: Fall Precaution Comments: chronic R sided weakness Restrictions Weight Bearing Restrictions: No  Pain Interference Pain Interference Pain Effect on Sleep: 3. Frequently Pain Interference with Therapy Activities: 3. Frequently Pain Interference with Day-to-Day Activities: 3. Frequently Home Living/Prior Functioning Home Living Available Help at Discharge: Family Type of Home: House Home Access: Stairs to enter Secretary/administrator of Steps: 3 Entrance Stairs-Rails: Can reach both Home Layout: One level Bathroom Shower/Tub: Health visitor: Handicapped height Additional Comments: pt lives alone, has been doing OPPT at [facility] in [place], family/daughter helps him as needed.  Lives With: Alone Prior Function Level of Independence: Independent with basic ADLs;Requires assistive device for independence;Independent with homemaking with wheelchair (Pt reports use of platform walker for grocery store and [place], use of SPC to get from house to car, use of Rollator in common areas of house, no device in room/bathroom due to narrow space (furniture walking) Able to Take Stairs?: Yes Driving: Yes Vision/Perception  Vision - History Ability to See in Adequate Light: 0 Adequate Perception Perception: Within Functional Limits Praxis Praxis: Intact  Cognition Overall  Cognitive Status: No family/caregiver present to determine baseline cognitive functioning Arousal/Alertness: Awake/alert Orientation Level: Oriented X4 Memory: Appears intact Awareness: Appears intact Problem Solving: Appears intact Safety/Judgment: Appears intact Sensation Sensation Light Touch: Appears Intact Hot/Cold: Not tested Proprioception: Impaired by gross assessment Stereognosis: Not tested Coordination Gross Motor Movements are Fluid and Coordinated: No Fine Motor Movements are Fluid and Coordinated: Yes Coordination and Movement Description: ataxic with hunched over posture- pt unable to fully describe if this is baseline or not Motor  Motor Motor: Abnormal postural alignment and control;Ataxia   Trunk/Postural Assessment  Cervical Assessment Cervical Assessment: Exceptions to [facility] (forward head) Thoracic Assessment Thoracic Assessment: Exceptions to [facility] (rounded shoulders and kyphosis) Lumbar Assessment Lumbar Assessment: Exceptions to [facility] (significant lumbar flexion with ambulation without device) Postural Control Postural Control: Deficits on evaluation Righting Reactions: inadequate  Balance Balance Balance Assessed: Yes Static Sitting Balance Static Sitting - Balance Support: Feet supported Static Sitting - Level of Assistance: 6: Modified independent (Device/Increase time) Dynamic Sitting Balance Dynamic Sitting - Balance Support: Feet supported Dynamic Sitting - Level of Assistance: 5: Stand by assistance Static Standing Balance Static Standing - Level of Assistance: 5: Stand by assistance (CGA) Dynamic Standing Balance Dynamic Standing - Balance Support: During functional activity Dynamic Standing - Level of Assistance: 4: Min assist Dynamic Standing - Comments: gait with no AD Extremity Assessment  RLE Assessment RLE Assessment: Exceptions to [facility] General Strength Comments: grossly 4+/5 LLE Assessment LLE Assessment: Exceptions to [facility] General  Strength Comments: grossly 4+/5  Care Tool Care Tool Bed Mobility Roll left and right activity   Roll left and right assist level: Supervision/Verbal cueing    Sit to lying activity   Sit to lying assist level: Supervision/Verbal cueing    Lying to sitting on side of bed activity   Lying to sitting on side of bed assist level: the ability to move from lying on the back to sitting on the side of the bed  with no back support.: Supervision/Verbal cueing     Care Tool Transfers Sit to stand transfer   Sit to stand assist level: Minimal Assistance - Patient > 75%    Chair/bed transfer   Chair/bed transfer assist level: Minimal Assistance - Patient > 75%     Psychologist, clinical transfer assist level: Minimal Assistance - Patient > 75%      Care Tool Locomotion Ambulation   Assist level: Minimal Assistance - Patient > 75% Assistive device: No Device (Pt reports PLOF bedroom unaccessible with device so ambulated with no device until reached hallway) Max distance: 5 feet  Walk 10 feet activity Walk 10 feet activity did not occur: Safety/medical concerns       Walk 50 feet with 2 turns activity Walk 50 feet with 2 turns activity did not occur: Safety/medical concerns      Walk 150 feet activity Walk 150 feet activity did not occur: Safety/medical concerns      Walk 10 feet on uneven surfaces activity Walk 10 feet on uneven surfaces activity did not occur: Safety/medical concerns      Stairs   Assist level: Contact Guard/Touching assist Stairs assistive device: 2 hand rails Max number of stairs: 12  Walk up/down 1 step activity   Walk up/down 1 step (curb) assist level: Contact Guard/Touching assist Walk up/down 1 step or curb assistive device: 2 hand rails  Walk up/down 4 steps activity   Walk up/down 4 steps assist level: Contact Guard/Touching assist Walk up/down 4 steps assistive device: 2 hand rails  Walk up/down 12 steps activity   Walk  up/down 12 steps assist level: Contact Guard/Touching assist Walk up/down 12 steps assistive device: 2 hand rails  Pick up small objects from floor Pick up small object from the floor (from standing position) activity did not occur: Safety/medical concerns      Wheelchair Is the patient using a wheelchair?: No          Wheel 50 feet with 2 turns activity      Wheel 150 feet activity        Refer to Care Plan for Long Term Goals  SHORT TERM GOAL WEEK 1 PT Short Term Goal 1 (Week 1): STG=LTG due to ELOS  Recommendations for other services: None   Skilled Therapeutic Intervention Mobility Bed Mobility Bed Mobility: Supine to Sit;Sit to Supine Supine to Sit: Supervision/Verbal cueing Sit to Supine: Supervision/Verbal cueing Transfers Transfers: Sit to Stand;Stand to Sit;Stand Pivot Transfers Sit to Stand: Minimal Assistance - Patient > 75% Stand to Sit: Minimal Assistance - Patient > 75% Stand Pivot Transfers: Minimal Assistance - Patient > 75% Stand Pivot Transfer Details: Tactile cues for posture;Verbal cues for gait pattern;Verbal cues for technique Transfer (Assistive device): None Locomotion  Gait Ambulation: Yes Gait Assistance: Minimal Assistance - Patient > 75% Gait Distance (Feet): 5 Feet Assistive device: None Gait Assistance Details: Verbal cues for gait pattern;Verbal cues for technique;Tactile cues for posture Gait Assistance Details: performed with no AD as pt reports does not always use AD at home inside the house, pt demos crouched gait, narrow BOS, step to gait Gait Gait: Yes Gait Pattern: Narrow base of support;Step-to pattern;Shuffle;Decreased stride length;Decreased step length - right;Decreased step length - left;Right flexed knee in stance;Left flexed knee in stance Gait velocity: decreased Stairs / Additional Locomotion Stairs: Yes Stairs Assistance: Contact Guard/Touching assist Stair Management Technique: Two rails;Alternating pattern Number of  Stairs: 12 Height  of Stairs: 3 Naval architect Mobility: No   Discharge Criteria: Patient will be discharged from PT if patient refuses treatment 3 consecutive times without medical reason, if treatment goals not met, if there is a change in medical status, if patient makes no progress towards goals or if patient is discharged from hospital.  The above assessment, treatment plan, treatment alternatives and goals were discussed and mutually agreed upon: by patient  Treatment Intervention  Treatment Session 1   Pt supine in bed upon arrival. Pt agreeable to therapy, pt denies any pain.   Evaluation completed (see details above and below) with education on PT POC and goals and individual treatment initiated with focus on gait training. Education provided on PT evaluation, CIR policies, and therapy schedule. Performed all components of caretool with no AD as pt reports he did not use AD in his room or bathroom due to narrow space, inaccessible to Rollator. Pt performed bed mobility with supervision for safety. Pt performed sit<>stand, stand pivot transfer with no AD and min A, verbal cues provided for technique and safety. Pt ambulated 5 feet with no device with ankle instability, pigeon toed, decreased foot clearance, shuffle, scissoring, crouch gait, significant lumbar flexion, kyphosis, rounded shoulders. Pt demos improved endurance with RW and min A as pt able to ambulate 150+ feet, but demos same gait deficits described above, with improved step length, but demos foot slap with fatigue. Pt performed car transfer with no AD min A, verbal cues provided for technique. Pt ascended 12 3 inch steps with B HR, with reciprocal gait and CGA, pt demos tendency to step onto lateral aspect of R foot, and requires verbal cues for step clearance for safety.   Pt supine in bed with HOB elevated, bed alarm on, and all needs within reach at end of session.   Treatment Session 2   Pt supine in  bed asleep upon arrival. Pt agreeable to therapy. Pt denies any pain.   Pt reports preference for [place] of Rollator versus RW. Pt ambulated with Rollator from room to main gym with same deficits as described above. Verbal cuing provided for walking closer to Rollator for safety, verbal cuing provided for upright posture, and leading with heel. Therapist assessed Rollator versus walker, no significant difference. Pt reports daughter is going to bring platform rollator this evening. Pt ambulated with rollator x35 feet with green line on floor as visual cue to widen BOS. Pt performed sit<>stand x10 with min A fading to CGA with verbal cuing for technique, and tactile/verbal cuing for upright posture, and glute activation upon standing, verbal/tactile cues for postural control as pt demos tendency to favor leaning towards R with standing. Pt performed toe taps 2x10 onto 6 inch step with R UE support on chair to R of pt and L HHA with CGA to min A for instability and intermittent LOB.   Education provided on safety with ambulating with RW/Rollator, pt reports understanding. Pt ambulated with Rollator main gym to room with CGA to min A with one episode of loss of balance due to decreased R LE foot clearance, verbal cues provided for foot clearance and heel strike.   [facility] [place], [place], DPT  09/10/2022, 4:18 PM

## 2022-09-10 NOTE — Progress Notes (Signed)
Inpatient Rehabilitation Admission Medication Review by a Pharmacist  A complete drug regimen review was completed for this patient to identify any potential clinically significant medication issues.  High Risk Drug Classes Is patient taking? Indication by Medication  Antipsychotic Yes Comapazine-nausea  Anticoagulant Yes Enoxaparin- VTE ppx  Antibiotic No   Opioid No   Antiplatelet No   Hypoglycemics/insulin No   Vasoactive Medication Yes Amlodipine, lisinopril proscar-HTN Proscar, Tamsulosin- BPH  hemotherapy No   Other Yes Allopurinol- gout Atorvastatin- HLD Dalfamipridine, dimethyl fumarate- MS Protonix- GERD Remeron-depression/mood Melatonin- sleep     Type of Medication Issue Identified Description of Issue Recommendation(s)  Drug Interaction(s) (clinically significant)     Duplicate Therapy     Allergy     No Medication Administration End Date     Incorrect Dose     Additional Drug Therapy Needed     Significant med changes from prior encounter (inform family/care partners about these prior to discharge). Holding HCTZ, metformin, potassium. Lisinopril dose reduced to 10mg  daily. Restart PTA meds when and if necessary during CIR admission or at time of discharge, if warranted   Other       Clinically significant medication issues were identified that warrant physician communication and completion of prescribed/recommended actions by midnight of the next day:  No  Name of provider notified for urgent issues identified:   Provider Method of Notification:     Pharmacist comments:   Time spent performing this drug regimen review (minutes):  30  Thank you for allowing pharmacy to be part of this patients care team. Noah Delaine, RPh Clinical Pharmacist 09/10/2022 8:07 AM

## 2022-09-10 NOTE — Progress Notes (Addendum)
PROGRESS NOTE   Subjective/Complaints: Reports BM with therapy yesterday. He uses miralax at home. Continues to have occasional cough. No new concerns this AM.    Review of Systems  Constitutional:  Negative for chills and fever.  HENT:  Positive for congestion.   Respiratory:  Positive for cough.   Cardiovascular:  Negative for chest pain.  Gastrointestinal:  Positive for constipation. Negative for abdominal pain, nausea and vomiting.  Musculoskeletal:  Negative for joint pain.  Neurological:  Positive for weakness.     Objective:   No results found. Recent Labs    09/10/22 0528  WBC 5.1  HGB 12.7*  HCT 35.8*  PLT 155   Recent Labs    09/10/22 0528  NA 138  K 3.3*  CL 104  CO2 27  GLUCOSE 105*  BUN 12  CREATININE 0.90  CALCIUM 8.2*    Intake/Output Summary (Last 24 hours) at 09/10/2022 0843 Last data filed at 09/10/2022 1610 Gross per 24 hour  Intake 396 ml  Output 275 ml  Net 121 ml        Physical Exam: Vital Signs Blood pressure (!) 157/90, pulse 68, temperature 98 F (36.7 C), temperature source Oral, resp. rate 20, height  (1.803 m), weight 102.2 kg, SpO2 100 %.    General: No apparent distress HEENT: [place], AT, Mucus membranes dry Neck: Supple without JVD or lymphadenopathy Heart: Reg rate and rhythm. No murmurs rubs or gallops Chest: CTA bilaterally without wheezes, rales, or rhonchi; no distress Abdomen: Soft, non-tender, non-distended, bowel sounds positive. Extremities: No clubbing, cyanosis, or edema. Pulses are 2+ Psych: Pt's affect is appropriate. Pt is cooperative Skin: Clean and intact without signs of breakdown Neuro:     He is alert and oriented to person, place, and time.  Intact to light touch in all 4 extremities No increased tone felt and Hoffman's (-); no clonus  Musculoskeletal:    Cervical back: Neck supple. No tenderness.     Comments: UE strength 5/5 B/L RLE  HF, KE, KF, DF and PF 5-/5- just slightly weaker than L side LLE- 5/5    Assessment/Plan: 1. Functional deficits which require 3+ hours per day of interdisciplinary therapy in a comprehensive inpatient rehab setting. Physiatrist is providing close team supervision and 24 hour management of active medical problems listed below. Physiatrist and rehab team continue to assess barriers to discharge/monitor patient progress toward functional and medical goals  Care Tool:  Bathing              Bathing assist       Upper Body Dressing/Undressing Upper body dressing        Upper body assist      Lower Body Dressing/Undressing Lower body dressing            Lower body assist       Toileting Toileting    Toileting assist Assist for toileting: Maximal Assistance - Patient 25 - 49%     Transfers Chair/bed transfer  Transfers assist  Chair/bed transfer activity did not occur: Safety/medical concerns        Locomotion Ambulation   Ambulation assist  Walk 10 feet activity   Assist           Walk 50 feet activity   Assist           Walk 150 feet activity   Assist           Walk 10 feet on uneven surface  activity   Assist           Wheelchair     Assist               Wheelchair 50 feet with 2 turns activity    Assist            Wheelchair 150 feet activity     Assist          Blood pressure (!) 157/90, pulse 68, temperature 98 F (36.7 C), temperature source Oral, resp. rate 20, height 5\' 11"  (1.803 m), weight 102.2 kg, SpO2 100 %.  Medical Problem List and Plan: 1. 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   -Continue CIR  -Team conference today please see physician documentation under team conference tab, met with team   to discuss problems,progress, and goals. Formulized individual treatment plan based on medical history, underlying problem and comorbidities.   2.  Antithrombotics: -DVT/anticoagulation:  Pharmaceutical: Lovenox- pt absolutely refuses any type of blood thinner, however willing to use SCDs             -antiplatelet therapy: N/A 3. Pain Management:  tylenol prn.  4. Mood/Behavior/Sleep: LCSW to follow for evaluation and support.              -antipsychotic agents: N/A 5. Neuropsych/cognition: This patient appears capable of making decisions on his own behalf. 6. Skin/Wound Care: Routine pressure relief measures.  7. Fluids/Electrolytes/Nutrition: Monitor I/O. Check CMET in am 8. MS  peudoexacerbation: Improving. Continue Dalfampridine and Dimethyl furmarate.  9.  HTN: Monitor BP TID--on proscar and amlodipine.              --continue to hold HCTZ/K dur  -4/17 restart lisinopril at 10mg        09/10/2022    5:55 AM 09/09/2022    8:34 PM 09/09/2022    4:00 PM  Vitals with BMI  Height   5\' 11"   Weight   225 lbs 5 oz  BMI   31.44  Systolic 157 149 782  Diastolic 90 95 80  Pulse 68 76 70    10.  T2DM: Hgb A1C- Monitor BS ac/hs--variable and question intake.  --Will continue to hold metformin.  -4/17 controlled overall, continue to monitor CBG (last 3)  Recent Labs    09/09/22 1653 09/09/22 2127 09/10/22 0613  GLUCAP 140* 163* 101*    11. H/o BPH: Continue proscar and flomax-->change flomax to evenings to avoid orthostatic changes.  -Check bladder scans 12. Constipation: Continue miralax bid. Continue senokot  -4/17 improved after enema yesterday  13. OSA: Continue CPAP.  14. GERD:Symptoms managed with PPI 15. Thrombocytopenia: Monitor for signs of bleeding. Recheck plts in am. -4/17 up to 155 today   16. Droplet precautions- not clear when they will stop 17. Hypokalemia  -4/17order 40mg  Kdur, recheck tomorrow      LOS: 1 days A FACE TO FACE  EVALUATION WAS  PERFORMED  Fanny Dance 09/10/2022, 8:43 AM

## 2022-09-11 DIAGNOSIS — K5900 Constipation, unspecified: Secondary | ICD-10-CM | POA: Diagnosis not present

## 2022-09-11 DIAGNOSIS — I10 Essential (primary) hypertension: Secondary | ICD-10-CM | POA: Diagnosis not present

## 2022-09-11 DIAGNOSIS — G35 Multiple sclerosis: Secondary | ICD-10-CM | POA: Diagnosis not present

## 2022-09-11 DIAGNOSIS — E119 Type 2 diabetes mellitus without complications: Secondary | ICD-10-CM | POA: Diagnosis not present

## 2022-09-11 LAB — BASIC METABOLIC PANEL
Anion gap: 10 (ref 5–15)
BUN: 15 mg/dL (ref 8–23)
CO2: 27 mmol/L (ref 22–32)
Calcium: 8.4 mg/dL — ABNORMAL LOW (ref 8.9–10.3)
Chloride: 104 mmol/L (ref 98–111)
Creatinine, Ser: 1.02 mg/dL (ref 0.61–1.24)
GFR, Estimated: >60 mL/min (ref >60)
Glucose, Bld: 98 mg/dL (ref 70–99)
Potassium: 3.5 mmol/L (ref 3.5–5.1)
Sodium: 141 mmol/L (ref 135–145)

## 2022-09-11 LAB — GLUCOSE, CAPILLARY
Glucose-Capillary: 96 mg/dL (ref 70–99)
Glucose-Capillary: 109 mg/dL — ABNORMAL HIGH (ref 70–99)
Glucose-Capillary: 166 mg/dL — ABNORMAL HIGH (ref 70–99)
Glucose-Capillary: 161 mg/dL — ABNORMAL HIGH (ref 70–99)

## 2022-09-11 MED ORDER — POTASSIUM CHLORIDE CRYS ER 20 MEQ PO TBCR
20.0000 meq | EXTENDED_RELEASE_TABLET | Freq: Once | ORAL | Status: AC
  Administered 2022-09-11: 20 meq via ORAL
  Filled 2022-09-11: qty 1

## 2022-09-11 NOTE — Progress Notes (Addendum)
Physical Therapy Session Note  Patient Details  Name: James Gibbs MRN: [id number] Date of Birth: 09-20-1955  Today's Date: 09/11/2022 PT Individual Time: 0904-0959 PT Individual Time Calculation (min): 55 min   Short Term Goals: Week 1:  PT Short Term Goal 1 (Week 1): STG=LTG due to ELOS  Skilled Therapeutic Interventions/Progress Updates:   Patient received in bed resting comfortably with no c/o pain at start of session. Pt removed socks on bil feet as they were doubled up then re-donned hospital socks. No cues needed to manage Lt LE however cues to bring Rt foot onto Lt knee for sock management on Rt foot. Pt able to thread pant legs on bil LE in similar fashion in supine. Supine>sit EOB transfer completed with supervision and use of bed rail. Pt donned shoes at EOB with set up assist for slip on sneakers with rigid heel. Pt's "Upwalker" has been brought in from home and pt directed set up of walker in preparation for standing. Sit<>stand completed from elevated EOB with Min assist to obtain balance at [employer] (rollator). Pt ambulated ~80' to orthogym and took seated rest on mat table. During gait Rt knee noted to remain in flexion throughout stance phase. Standing terminal knee extension completed with green TB at Rt knee. Pt completed 2x 15 reps with seated rest between each set. 4" step taps completed next with 2x 15 reps on Rt LE and 2x 15 reps on Lt. Pt maintained upright midline posture with Rt step taps however pt weight shifting to Rt UE support on Rollator platform with Lt step taps to support Rt LE in stance. Verbal cues for more upright posture on second set effective at slight reduction in remaining more neutral in positioning. Seated rest breaks between each set and extended rest for next exercise, Pt rollator platform lowered from 13 height to 8 to reduce UE extremity support in standing to encourage greater LE activation for mini-squats. 2x 10 reps completed with min-squat taps  from mat table to tall standing with verbal/tactile cues to extend at knees and hips. PT ambulated ~80' back to room from ortho gym with cues to maintain closer proximity and upright posture and to lift Rt LE high to avoid toe drag. Pt sat EOB at EOS and returned to supine with supervision. Alarm on and call bell within reach and all needs met at EOS.    Therapy Documentation Precautions:  Precautions Precautions: Fall Precaution Comments: chronic R sided weakness Restrictions Weight Bearing Restrictions: No  Pain:  Pt denied pain at start and throughout session. Monitored throughout.   Therapy/Group: Individual Therapy   Wynn Maudlin, DPT Acute Rehabilitation Services Office [phone]  09/11/22 4:24 PM

## 2022-09-11 NOTE — Progress Notes (Signed)
Patient ID: James Gibbs, male   DOB: 11-28-1955, 67 y.o.   MRN: [id number]  Met with pt to give him the team conference update with goals of mod/I level and discharge date of 4/27. He will let his daughter know she did bring his upright rolling walker to use while here. Discussed home health versus OP therapies he will think about and discuss next week. Continue to work on discuss follow up.

## 2022-09-11 NOTE — Progress Notes (Signed)
PROGRESS NOTE   Subjective/Complaints: No new concerns this AM. Report he has occasional cough. Discussed trying PRN robitussin.    Review of Systems  Constitutional:  Negative for chills, fever and malaise/fatigue.  HENT:  Positive for congestion.   Respiratory:  Positive for cough.   Cardiovascular:  Negative for chest pain.  Gastrointestinal:  Positive for constipation. Negative for abdominal pain, nausea and vomiting.  Musculoskeletal:  Negative for joint pain.  Skin:  Negative for rash.  Neurological:  Positive for weakness. Negative for headaches.     Objective:   No results found. Recent Labs    09/10/22 0528  WBC 5.1  HGB 12.7*  HCT 35.8*  PLT 155    Recent Labs    09/10/22 0528 09/11/22 0605  NA 138 141  K 3.3* 3.5  CL 104 104  CO2 27 27  GLUCOSE 105* 98  BUN 12 15  CREATININE 0.90 1.02  CALCIUM 8.2* 8.4*     Intake/Output Summary (Last 24 hours) at 09/11/2022 0818 Last data filed at 09/11/2022 0807 Gross per 24 hour  Intake 730 ml  Output --  Net 730 ml         Physical Exam: Vital Signs Blood pressure (!) 141/85, pulse 71, temperature 97.8 F (36.6 C), temperature source Oral, resp. rate 18, height  (1.803 m), weight 102.2 kg, SpO2 98 %.    General: No apparent distress HEENT: [place], AT, Mucus membranes moist Neck: Supple without JVD or lymphadenopathy Heart: Reg rate and rhythm. No murmurs rubs or gallops Chest: CTA bilaterally without wheezes, rales, or rhonchi; no distress Abdomen: Soft, non-tender, non-distended, bowel sounds positive. Extremities: No clubbing, cyanosis, or edema. Pulses are 2+ Psych: Pt's affect is appropriate. Pt is cooperative Skin: Clean and intact without signs of breakdown Neuro:     He is alert and oriented to person, place, and time.  Intact to light touch in all 4 extremities No increased tone felt and Hoffman's (-); no clonus   Musculoskeletal:    Cervical back: Neck supple. No tenderness.     Comments: UE strength 5/5 B/L RLE HF, KE, KF, DF and PF 5-/5- just slightly weaker than L side LLE- 5/5    Assessment/Plan: 1. Functional deficits which require 3+ hours per day of interdisciplinary therapy in a comprehensive inpatient rehab setting. Physiatrist is providing close team supervision and 24 hour management of active medical problems listed below. Physiatrist and rehab team continue to assess barriers to discharge/monitor patient progress toward functional and medical goals  Care Tool:  Bathing    Body parts bathed by patient: Right arm, Left arm, Chest, Right lower leg, Left lower leg, Face, Abdomen, Front perineal area, Buttocks, Right upper leg, Left upper leg         Bathing assist Assist Level: Minimal Assistance - Patient > 75%     Upper Body Dressing/Undressing Upper body dressing   What is the patient wearing?: Pull over shirt    Upper body assist Assist Level: Supervision/Verbal cueing    Lower Body Dressing/Undressing Lower body dressing      What is the patient wearing?: Underwear/pull up, Pants     Lower body assist  Assist for lower body dressing: Minimal Assistance - Patient > 75%     Toileting Toileting    Toileting assist Assist for toileting: Minimal Assistance - Patient > 75%     Transfers Chair/bed transfer  Transfers assist  Chair/bed transfer activity did not occur: Safety/medical concerns  Chair/bed transfer assist level: Minimal Assistance - Patient > 75%     Locomotion Ambulation   Ambulation assist      Assist level: Minimal Assistance - Patient > 75% Assistive device: No Device (Pt reports PLOF bedroom unaccessible with device so ambulated with no device until reached hallway) Max distance: 5 feet   Walk 10 feet activity   Assist  Walk 10 feet activity did not occur: Safety/medical concerns        Walk 50 feet activity   Assist Walk  50 feet with 2 turns activity did not occur: Safety/medical concerns         Walk 150 feet activity   Assist Walk 150 feet activity did not occur: Safety/medical concerns         Walk 10 feet on uneven surface  activity   Assist Walk 10 feet on uneven surfaces activity did not occur: Safety/medical concerns         Wheelchair     Assist Is the patient using a wheelchair?: No             Wheelchair 50 feet with 2 turns activity    Assist            Wheelchair 150 feet activity     Assist          Blood pressure (!) 141/85, pulse 71, temperature 97.8 F (36.6 C), temperature source Oral, resp. rate 18, height 5\' 11"  (1.803 m), weight 102.2 kg, SpO2 98 %.  Medical Problem List and Plan: 1. 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   -Continue CIR  -Est Discharge 4/27  2.  Antithrombotics: -DVT/anticoagulation:  Pharmaceutical: Lovenox- pt absolutely refuses any type of blood thinner, however willing to use SCDs             -antiplatelet therapy: N/A 3. Pain Management:  tylenol prn.  4. Mood/Behavior/Sleep: LCSW to follow for evaluation and support.              -antipsychotic agents: N/A 5. Neuropsych/cognition: This patient appears capable of making decisions on his own behalf. 6. Skin/Wound Care: Routine pressure relief measures.  7. Fluids/Electrolytes/Nutrition: Monitor I/O. Check CMET in am 8. MS  peudoexacerbation: Improving. Continue Dalfampridine and Dimethyl furmarate.  9.  HTN: Monitor BP TID--on proscar and amlodipine.              --continue to hold HCTZ/K dur  -4/17 restart lisinopril at 10mg    4/18 BP improved, continue to monitor trend      09/11/2022    5:54 AM 09/10/2022    9:00 PM 09/10/2022    7:34 PM  Vitals with BMI  Systolic 141  134  Diastolic 85  80   Pulse 71 68 67    10.  T2DM: Hgb A1C- Monitor BS ac/hs--variable and question intake.  --Will continue to hold metformin.  -4/18 CBGs controlled, continue to hold metformin CBG (last 3)  Recent Labs    09/10/22 1635 09/10/22 2040 09/11/22 0552  GLUCAP 147* 161* 96     11. H/o BPH: Continue proscar and flomax-->change flomax to evenings to avoid orthostatic changes.  -Check bladder scans 12. Constipation: Continue miralax bid. Continue senokot  -4/18 LBM yesterday, improved 13. OSA: Continue CPAP.  14. GERD:Symptoms managed with PPI 15. Thrombocytopenia: Monitor for signs of bleeding. Recheck plts in am. -4/17 up to 155 today   16. Droplet precautions- not clear when they will stop 17. Hypokalemia  -4/17order  Kdur, recheck tomorrow  -4/18 3.5 today improved, give additional x1, recheck monday      LOS: 2 days A FACE TO FACE EVALUATION WAS PERFORMED  Fanny Dance 09/11/2022, 8:18 AM

## 2022-09-11 NOTE — Progress Notes (Signed)
Occupational Therapy Session Note  Patient Details  Name: James Gibbs MRN: [id number] Date of Birth: 09-21-1955  Today's Date: 09/11/2022 OT Individual Time: 2025-4270 OT Individual Time Calculation (min): 75 min    Short Term Goals: Week 1:  OT Short Term Goal 1 (Week 1): STG= LTG d/ ELOS  Skilled Therapeutic Interventions/Progress Updates:   Pt seen for skilled OT session with focus on posture, strength, balance and standing tolerance within full self care routine incl shower and shaving retraining. Pt resting bed level upon OT arrival. Pt requesting toileting initially for BM need. Pt moved to EOB with S, sit to stand with rollator support with close S and cues for upright trunk and hip extension as pt reports some fatigue from long PT session prior. Pt required close S for amb to bathroom and min cues to align rollator and body correctly in front of toilet vs abandoning device. Transfer to regular toilet with grab bar with close S.BM and voiding (see flowsheet) with set up for hygiene. Transfer from lower toilet to rollator with CGA and cues for forward weight shift. Used commode inside stall shower with grab bars for standing support with overall close S UB bathing, CGA LB bathing with occasional support for managing shower hose and water inside curtain. CGA for drying in sititng and standing for safety. Used rollator to amb to sink side to w/c set up for seated UB dressing and seated with standing intermittently for shaving and LB dressing with CGA including pull up. Mod cues and facilitation for knee hip and trunk extension especially with prolonged standing. Seated rests between intervals. Once completed, pt amb back to bed for supine stretch with min cues and demo. OT left pt bed level at end of session with alarm set, needs and nurse call button in reach.    Pain:denies all pain 0/10    Therapy Documentation Precautions:  Precautions Precautions: Fall Precaution Comments:  chronic R sided weakness Restrictions Weight Bearing Restrictions: No    Therapy/Group: Individual Therapy  Vicenta Dunning 09/11/2022, 7:40 AM

## 2022-09-11 NOTE — Progress Notes (Signed)
Physical Therapy Session Note  Patient Details  Name: DEANDREW SEANEY MRN: [id number] Date of Birth: 09-11-1955  Today's Date: 09/11/2022 PT Individual Time: 1300-1413 PT Individual Time Calculation (min): 73 min   Short Term Goals: Week 1:  PT Short Term Goal 1 (Week 1): STG=LTG due to ELOS  Skilled Therapeutic Interventions/Progress Updates:     Pt supine in bed with HOB elevated upon arrival. Pt agreeable to therapy. Pt denies any pain.   Pt performed supine to sit with supervision. Pt performed sit<>stand with platform Rollator (upright), with min A, verbal cues for UE positioning.   Pt ambulated from room to main gym with upright and min A for stability and intermittent LOB. Pt demos improved upright posture with upright compared to Rollator/RW. Verbal cues provided to widen base of support and lead with heel as pt continues to demo scissoring, narrow BOS, decreased foot clearance R>L.   Pt performed sit<>stand x10 from mat table with upright CGA to supervision, with verbal cues provided for technique.   Pt ambulated x35 feet with intent to straddle line with therapist intent to provide visual cue for wider BOS but pt unable to see line due to upright seat set up blocking vision of surface below him. Pt ambulated 3x10 feet forward and backwards while straddling 3 dowel rods on floor, with verbal cue to pt, not to touch feet to dowel rod, pt demos improved BOS, with tactile cuing from dowel rod, but demos decreased proprioception of LE R>L. Pt performed forward and lateral toe taps on numbered circles placed on floor to improve proprioception, with R UE on chair (positioned to R of pt and L HHA. Pt amulated 4 x10 feet with upright with numbered dots placed on floor to facilitate wider BOS, and reciprocal gait, with therapist providing CGA and verbal cues provided for stepping on the number dots.   Pt performed 2x10 standing hip abduction with B UE support on chair (positioned in front of  pt), with pt providing verbal and tactile cues to facilitate upright posture, and for pt to maintain knee extension with standing hip abduction. Pt performed 5x15 bilateral lateral stepping with B UE support on // bars, verbal cues provided to face toes, knees, and chest to mirror (posiitoned in front of pt) as pt demos tendency to rotate to R with R lateral steps.   Pt requesting exercises to do once in room. Pt performed 1x10 bridging, 1x10 B supine hip abduction, 1x10 B SLR, 1x10 B clam shells, with pt providing verbal and tactile cues to reduce compensation and to perform within full range.  Pt ambulated from gym to room with min A for intermittent LOB due to scissoring gait. Pt performed sitting EOB to supine with HOB elevated with supervision.   Pt supine in bed at end of session with bed alarm on, and needs within reach.   Therapy Documentation Precautions:  Precautions Precautions: Fall Precaution Comments: chronic R sided weakness Restrictions Weight Bearing Restrictions: No   Therapy/Group: Individual Therapy  [facility] Ambrose [place], [place], DPT  09/11/2022, 7:53 AM

## 2022-09-12 DIAGNOSIS — G35 Multiple sclerosis: Secondary | ICD-10-CM | POA: Diagnosis not present

## 2022-09-12 DIAGNOSIS — E119 Type 2 diabetes mellitus without complications: Secondary | ICD-10-CM | POA: Diagnosis not present

## 2022-09-12 DIAGNOSIS — N400 Enlarged prostate without lower urinary tract symptoms: Secondary | ICD-10-CM

## 2022-09-12 DIAGNOSIS — I10 Essential (primary) hypertension: Secondary | ICD-10-CM | POA: Diagnosis not present

## 2022-09-12 DIAGNOSIS — K5900 Constipation, unspecified: Secondary | ICD-10-CM | POA: Diagnosis not present

## 2022-09-12 LAB — GLUCOSE, CAPILLARY
Glucose-Capillary: 95 mg/dL (ref 70–99)
Glucose-Capillary: 102 mg/dL — ABNORMAL HIGH (ref 70–99)
Glucose-Capillary: 166 mg/dL — ABNORMAL HIGH (ref 70–99)
Glucose-Capillary: 102 mg/dL — ABNORMAL HIGH (ref 70–99)

## 2022-09-12 NOTE — Progress Notes (Signed)
Physical Therapy Session Note  Patient Details  Name: James Gibbs MRN: [id number] Date of Birth: 12-23-1955  Today's Date: 09/12/2022 PT Individual Time: 0920-1005 PT Individual Time Calculation (min): 45 min   Short Term Goals: Week 1:  PT Short Term Goal 1 (Week 1): STG=LTG due to ELOS  Skilled Therapeutic Interventions/Progress Updates:    Pt presents in room in bed, denies pain, agreeable to PT. Session focused on gait training, pregait training, and transfer training to promote improved BOS, stability for multidirectional stepping, and stability for immediate stand. Pt completes bed mobility with increased time and supervision, sit to stand transfers with min assist to supervision throughout session to upwalker, does occasionally demonstrate posterior LOB requiring min assist to correct.  Pt ambulates to/from room to far door in main gym with upwalker, CGA with verbal cues for step width and upright posture. Pt completes 2x5 sit<>stands to upwalker with cues for terminal hip/knee extension with pt able to complete. Pt completes pregait standing hip abduction x10 BLE to promote improved hip abductor strength to prevent scissoring.  Pt completes gait training forward/backward ambulation with upwalker 3x8' CGA/min assist verbal cues for foot placement and step width. Pt ambulates around 3 cones 2 trials, first trial pt with difficulty seeing due to bag on upwalker, pt manages taking bag off of upwalker with unilateral UE support and CGA, improved negotiation of obstacles with 2nd trial with cues for step width to prevent scissoring.  Pt reports only using upwalker in community and states that he will ambulate with rollator and without device within house. Pt then completes 2x8' ambulating in //bars with occasional use of BUE support on //bars, CGA demonstrating excessive trunk and knee flexion.  Pt provided with seated rest breaks between all gait trials and exercises to promote energy  conservation and quality with tasks.  Pt ambulates back to room with verbal cues to stop and adjust posture to promote improved upright posturing with pt able to maintain until distracted.  Pt returns to bed and remains supine with all needs within reach, call light in place, and bed alarm activated at end of session.  Therapy Documentation Precautions:  Precautions Precautions: Fall Precaution Comments: chronic R sided weakness Restrictions Weight Bearing Restrictions: No   Therapy/Group: Individual Therapy  Edwin Cap PT, DPT 09/12/2022, 12:12 PM

## 2022-09-12 NOTE — Progress Notes (Signed)
PROGRESS NOTE   Subjective/Complaints: No new concerns, reports cough is gradually improving.  She would like to Robitussin this morning.   Review of Systems  Constitutional:  Negative for chills, fever and malaise/fatigue.  HENT:  Positive for congestion.   Respiratory:  Positive for cough.   Cardiovascular:  Negative for chest pain.  Gastrointestinal:  Positive for constipation. Negative for abdominal pain, nausea and vomiting.  Genitourinary: Negative.   Musculoskeletal:  Negative for joint pain.  Skin:  Negative for rash.  Neurological:  Positive for weakness. Negative for headaches.     Objective:   No results found. Recent Labs    09/10/22 0528  WBC 5.1  HGB 12.7*  HCT 35.8*  PLT 155    Recent Labs    09/10/22 0528 09/11/22 0605  NA 138 141  K 3.3* 3.5  CL 104 104  CO2 27 27  GLUCOSE 105* 98  BUN 12 15  CREATININE 0.90 1.02  CALCIUM 8.2* 8.4*     Intake/Output Summary (Last 24 hours) at 09/12/2022 1237 Last data filed at 09/12/2022 0843 Gross per 24 hour  Intake 760 ml  Output 1400 ml  Net -640 ml         Physical Exam: Vital Signs Blood pressure (!) 146/89, pulse 72, temperature 97.9 F (36.6 C), temperature source Oral, resp. rate 18, height  (1.803 m), weight 102.2 kg, SpO2 100 %.    General: No apparent distress HEENT: [place], AT, Mucus membranes moist Neck: Supple without JVD or lymphadenopathy Heart: Reg rate and rhythm. No murmurs rubs or gallops Chest: CTA bilaterally without wheezes, rales, or rhonchi; no distress Abdomen: Soft, non-tender, non-distended, bowel sounds positive. Extremities: No clubbing, cyanosis, or edema. Pulses are 2+ Psych: Pt's affect is appropriate. Pt is cooperative Skin: Clean and intact without signs of breakdown Neuro:     He is alert and oriented to person, place, and time.  Intact to light touch in all 4 extremities No increased tone felt and  Hoffman's (-); no clonus  Musculoskeletal:    Cervical back: Neck supple. No tenderness.     Comments: UE strength 5/5 B/L RLE HF, KE, KF, DF and PF 5-/5- just slightly weaker than L side LLE- 5/5    Assessment/Plan: 1. Functional deficits which require 3+ hours per day of interdisciplinary therapy in a comprehensive inpatient rehab setting. Physiatrist is providing close team supervision and 24 hour management of active medical problems listed below. Physiatrist and rehab team continue to assess barriers to discharge/monitor patient progress toward functional and medical goals  Care Tool:  Bathing    Body parts bathed by patient: Right arm, Left arm, Chest, Right lower leg, Left lower leg, Face, Abdomen, Front perineal area, Buttocks, Right upper leg, Left upper leg         Bathing assist Assist Level: Minimal Assistance - Patient > 75%     Upper Body Dressing/Undressing Upper body dressing   What is the patient wearing?: Pull over shirt    Upper body assist Assist Level: Supervision/Verbal cueing    Lower Body Dressing/Undressing Lower body dressing      What is the patient wearing?: Underwear/pull up, Pants  Lower body assist Assist for lower body dressing: Minimal Assistance - Patient > 75%     Toileting Toileting    Toileting assist Assist for toileting: Minimal Assistance - Patient > 75%     Transfers Chair/bed transfer  Transfers assist  Chair/bed transfer activity did not occur: Safety/medical concerns  Chair/bed transfer assist level: Minimal Assistance - Patient > 75%     Locomotion Ambulation   Ambulation assist      Assist level: Minimal Assistance - Patient > 75% Assistive device: Rollator (high) Max distance: 100   Walk 10 feet activity   Assist  Walk 10 feet activity did not occur: Safety/medical concerns  Assist level: Minimal Assistance - Patient > 75% Assistive device: Rollator   Walk 50 feet activity   Assist Walk  50 feet with 2 turns activity did not occur: Safety/medical concerns  Assist level: Minimal Assistance - Patient > 75% Assistive device: Rollator    Walk 150 feet activity   Assist Walk 150 feet activity did not occur: Safety/medical concerns         Walk 10 feet on uneven surface  activity   Assist Walk 10 feet on uneven surfaces activity did not occur: Safety/medical concerns         Wheelchair     Assist Is the patient using a wheelchair?: No             Wheelchair 50 feet with 2 turns activity    Assist            Wheelchair 150 feet activity     Assist          Blood pressure (!) 146/89, pulse 72, temperature 97.9 F (36.6 C), temperature source Oral, resp. rate 18, height 5\' 11"  (1.803 m), weight 102.2 kg, SpO2 100 %.  Medical Problem List and Plan: 1. Functional deficits secondary to pseudoexacerbation of MS due to rhinovirus             -patient may  shower             -ELOS/Goals: 7-10 days- mod I  -Continue CIR  -Est Discharge 4/27  2.  Antithrombotics: -DVT/anticoagulation:  Pharmaceutical: Lovenox- pt absolutely refuses any type of blood thinner, however willing to use SCDs             -antiplatelet therapy: N/A 3. Pain Management:  tylenol prn.  4. Mood/Behavior/Sleep: LCSW to follow for evaluation and support.              -antipsychotic agents: N/A 5. Neuropsych/cognition: This patient appears capable of making decisions on his own behalf. 6. Skin/Wound Care: Routine pressure relief measures.  7. Fluids/Electrolytes/Nutrition: Monitor I/O. Check CMET in am 8. MS  peudoexacerbation: Improving. Continue Dalfampridine and Dimethyl furmarate.  9.  HTN: Monitor BP TID--on proscar and amlodipine.              --continue to hold HCTZ/K dur  -4/17 restart lisinopril at 10mg    4/19 intermittently mildly elevated, consider increase lisinopril to 20 mg if continues      09/12/2022    5:31 AM 09/11/2022    8:30 PM 09/11/2022     2:30 PM  Vitals with BMI  Systolic 146 138 956  Diastolic 89 81 84  Pulse 72 68 72    10.  T2DM: Hgb A1C- Monitor BS ac/hs--variable and question intake.  --Will continue to hold metformin.  -4/19 CBGs controlled continue to monitor  CBG (last 3)  Recent Labs  09/11/22 2032 09/12/22 0635 09/12/22 1154  GLUCAP 161* 95 102*     11. H/o BPH: Continue proscar and flomax-->change flomax to evenings to avoid orthostatic changes.  -PVR 0-115 12. Constipation: Continue miralax bid. Continue senokot  -4/19 bowel movement today improved 13. OSA: Continue CPAP.  14. GERD:Symptoms managed with PPI 15. Thrombocytopenia: Monitor for signs of bleeding. Recheck plts in am. -4/17 up to 155 today   Recheck Monday  16. Droplet precautions- not clear when they will stop 17. Hypokalemia  -4/17order  Kdur, recheck tomorrow  -4/18 3.5 today improved, give additional x1, recheck monday      LOS: 3 days A FACE TO FACE EVALUATION WAS PERFORMED  Fanny Dance 09/12/2022, 12:37 PM

## 2022-09-12 NOTE — Progress Notes (Signed)
Occupational Therapy Session Note  Patient Details  Name: ULYSES PANICO MRN: [id number] Date of Birth: [birth year]  Today's Date: 09/12/2022 OT Individual Time: 4098-1191 OT Individual Time Calculation (min): 42 min    Short Term Goals: Week 1:  OT Short Term Goal 1 (Week 1): STG= LTG d/ ELOS  Skilled Therapeutic Interventions/Progress Updates:    Pt received supine with no c/o pain, agreeable to OT session. Session focused on community accessibility and trialing use of upright rollator pt brought in from home. Discussed community accessibility throughout session and provided education/strategies on fall prevention and safety. He stood from EOB with (S) and transitioned to the upright rollator. He was able to use this for 300+ ft of functional mobility to outside the hospital. Discussed accessing elevators and public doors. He took a seated rest break outside before returning inside. He felt the outdoors really improved psychosocial adjustment. He returned inside in similar fashion. Min cueing for increasing BOS. Discussed single vs quad cane use at the grocery store. Will try to plan an outing to practice this. He was left sitting EOB with a friend present visiting, bed alarm set.   Therapy Documentation Precautions:  Precautions Precautions: Fall Precaution Comments: chronic R sided weakness Restrictions Weight Bearing Restrictions: No  Therapy/Group: Individual Therapy  Crissie Reese 09/12/2022, 6:32 AM

## 2022-09-12 NOTE — Progress Notes (Addendum)
Physical Therapy Session Note  Patient Details  Name: James Gibbs MRN: [id number] Date of Birth: 1955-08-19  Today's Date: 09/12/2022 PT Individual Time: 1308-6578 and 1417-1530 PT Individual Time Calculation (min): 33 min and 73 min.  Short Term Goals: Week 1:  PT Short Term Goal 1 (Week 1): STG=LTG due to ELOS  Skilled Therapeutic Interventions/Progress Updates:   First session:  Pt presents semi-reclined in bed and agreeable to therapy after finishing breakfast.  Pt transfers sup to sit w/ supervision and HOB elevated.  Pt states need to use BR.  Pt transfers sit to stand w/ min A to rollator and amb w/ min A into BR.  Pt transfers to toilet w/ min A and manages clothing.  Pt continent of bowel and bladder in toilet.  Noted in Flowsheets.  Pt requires mod A to complete pericare.  Shorts threaded over feet and then pt manages clothing once standing.  Pt amb back to bedside w/ min A. Pt amb w/ tall rollator from home and min to CGA x 100' and 90' into hallways.  Pt w/ improved posture and 1 episode of scissoring, although verbal cues for increasing BOS.  Pt returned to room and transferred sit to supine w/ supervision.  Bed alarm on and all needs in reach.  Missed time of 25' 2/2 eating.  Second session:  Pt presents sitting on EOB visiting w/ friend, bed alarm going off.  Pt agreeable to therapy.  Pt transfers sit to stand w/ min A.  Pt amb x 75' to small gym w/ tall rollator and min A, verbal cues for upright posture as well as maintaining toes forward, pt tends to "pigeon-toe."  Pt performs Nu-step at Level 5 x 10' for BUE and LES (81 SPM), then decreased to LES only (67 SPM) w/ cues for avoiding frog-leg position, for a total steps of 970.  Pt amb to mat table w/ min A.  Pt performed overhead throws w/ red T-ball w/ feet on Airex cushion, rotation touching T-ball to top of cone outside of BOS to sides and reaching on floor for rolled ball and throwing back overhead to improve trunk  strength, 3 x 10 each.  Pt performed abd crunches from Red T-ball behind on mat table.  Pt amb multiple trials w/ tall rollator and min A w/ cues for posture and BOS.  Pt amb to room and requested to use BR.  Pt stood for continent bladder in toilet, steadying assist only for managing clothing.  Pt returned to bed and transferred sit to supine w/ supervision.  Bed alarm on and all needs in reach.     Therapy Documentation Precautions:  Precautions Precautions: Fall Precaution Comments: chronic R sided weakness Restrictions Weight Bearing Restrictions: No General: PT Amount of Missed Time (min): 12 Minutes PT Missed Treatment Reason: Other (Comment) (finishing breakfast.) Vital Signs:  Pain:0/10      Therapy/Group: Individual Therapy  Lucio Edward 09/12/2022, 9:33 AM

## 2022-09-12 NOTE — IPOC Note (Signed)
Overall Plan of Care [facility]) Patient Details Name: James Gibbs MRN: [id number] DOB: [birth year]  Admitting Diagnosis: Multiple sclerosis  Hospital Problems: Principal Problem:   Multiple sclerosis Active Problems:   Generalized weakness     Functional Problem List: Nursing Bladder, Bowel, Medication Management, Safety, Pain  PT Balance, Endurance, Pain  OT Balance, Safety, Endurance, Motor  SLP    TR         Basic ADL's: OT Bathing, Dressing, Toileting     Advanced  ADL's: OT       Transfers: PT Bed Mobility, Bed to Chair, Car  OT Toilet, Tub/Shower     Locomotion: PT Ambulation     Additional Impairments: OT None  SLP        TR      Anticipated Outcomes Item Anticipated Outcome  Self Feeding no goal  Swallowing      Basic self-care  mod I  Toileting  mod I   Bathroom Transfers mod I  Bowel/Bladder  manage bowel and bladder w mod I assist  Transfers  mod I  Locomotion  mod I  Communication     Cognition     Pain  < 4 with prns  Safety/Judgment  manage w cues   Therapy Plan: PT Intensity: Minimum of 1-2 x/day ,45 to 90 minutes PT Frequency: 5 out of 7 days PT Duration Estimated Length of Stay: 10 days OT Intensity: Minimum of 1-2 x/day, 45 to 90 minutes OT Frequency: 5 out of 7 days OT Duration/Estimated Length of Stay: 7-10 days     Team Interventions: Nursing Interventions Bladder Management, Disease Management/Prevention, Medication Management, Discharge Planning, Bowel Management, Patient/Family Education  PT interventions Ambulation/gait training, Discharge planning, Functional mobility training  OT Interventions Balance/vestibular training, Pain management, Discharge planning, Therapeutic Activities, Self Care/advanced ADL retraining, UE/LE Coordination activities, Functional mobility training, Disease mangement/prevention, Patient/family education, Therapeutic Exercise, Community reintegration, DME/adaptive equipment  instruction, Psychosocial support, UE/LE Strength taining/ROM  SLP Interventions    TR Interventions    SW/CM Interventions Discharge Planning, Psychosocial Support, Patient/Family Education   Barriers to Discharge MD  Medical stability  Nursing Home environment access/layout, Lack of/limited family support 1 level 3ste bil rail solo; has DME  PT Decreased caregiver support, Incontinence, Lack of/limited family support pain, ataxia  OT Decreased caregiver support intermittent family support/supervision  SLP      SW Lack of/limited family support     Team Discharge Planning: Destination: PT-Home ,OT- Home , SLP-  Projected Follow-up: PT-Home health PT, OT-  Outpatient OT, Home health OT (pt deciding), SLP-  Projected Equipment Needs: PT-To be determined, OT- To be determined, SLP-  Equipment Details: PT-per pt, pt has rollator, SPC, and platform walker, OT-  Patient/family involved in discharge planning: PT- Patient,  OT-Patient, SLP-   MD ELOS: 7-10 days Medical Rehab Prognosis:  Excellent Assessment: The patient has been admitted for CIR therapies with the diagnosis of multiple sclerosis pseudoexacerbation. The team will be addressing functional mobility, strength, stamina, balance, safety, adaptive techniques and equipment, self-care, bowel and bladder mgt, patient and caregiver education, NMR, community reentry. Goals have been set at mod I for mobility and self-care. Anticipated discharge destination is home.        See Team Conference Notes for weekly updates to the plan of care

## 2022-09-13 DIAGNOSIS — R058 Other specified cough: Secondary | ICD-10-CM

## 2022-09-13 DIAGNOSIS — G35 Multiple sclerosis: Secondary | ICD-10-CM | POA: Diagnosis not present

## 2022-09-13 DIAGNOSIS — I10 Essential (primary) hypertension: Secondary | ICD-10-CM | POA: Diagnosis not present

## 2022-09-13 DIAGNOSIS — E118 Type 2 diabetes mellitus with unspecified complications: Secondary | ICD-10-CM | POA: Diagnosis not present

## 2022-09-13 LAB — GLUCOSE, CAPILLARY
Glucose-Capillary: 101 mg/dL — ABNORMAL HIGH (ref 70–99)
Glucose-Capillary: 86 mg/dL (ref 70–99)
Glucose-Capillary: 141 mg/dL — ABNORMAL HIGH (ref 70–99)
Glucose-Capillary: 187 mg/dL — ABNORMAL HIGH (ref 70–99)

## 2022-09-13 MED ORDER — DM-GUAIFENESIN ER 30-600 MG PO TB12
1.0000 | ORAL_TABLET | Freq: Two times a day (BID) | ORAL | Status: DC
  Administered 2022-09-13 – 2022-09-20 (×15): 1 via ORAL
  Filled 2022-09-13 (×15): qty 1

## 2022-09-13 NOTE — Progress Notes (Signed)
PROGRESS NOTE   Subjective/Complaints: Still complains of productive cough. Asked if there's anything else we can do for it   Review of Systems  Constitutional:  Negative for chills, fever and malaise/fatigue.  HENT:  Positive for congestion.   Respiratory:  Positive for cough.   Cardiovascular:  Negative for chest pain.  Gastrointestinal:  Positive for constipation. Negative for abdominal pain, nausea and vomiting.  Genitourinary: Negative.   Musculoskeletal:  Negative for joint pain.  Skin:  Negative for rash.  Neurological:  Positive for weakness. Negative for headaches.     Objective:   No results found. No results for input(s): "WBC", "HGB", "HCT", "PLT" in the last 72 hours.  Recent Labs    09/11/22 0605  NA 141  K 3.5  CL 104  CO2 27  GLUCOSE 98  BUN 15  CREATININE 1.02  CALCIUM 8.4*     Intake/Output Summary (Last 24 hours) at 09/13/2022 0939 Last data filed at 09/13/2022 0800 Gross per 24 hour  Intake 475 ml  Output 1750 ml  Net -1275 ml         Physical Exam: Vital Signs Blood pressure (!) 142/88, pulse 65, temperature 98.4 F (36.9 C), temperature source Oral, resp. rate 16, height 5\' 11"  (1.803 m), weight 102.2 kg, SpO2 100 %.    Constitutional: No distress . Vital signs reviewed. HEENT: [place]AT, EOMI, oral membranes moist Neck: supple Cardiovascular: RRR without murmur. No JVD    Respiratory/Chest: CTA Bilaterally without wheezes or rales. Normal effort. Intermittent productive cough   GI/Abdomen: BS +, non-tender, non-distended Ext: no clubbing, cyanosis, or edema Psych: pleasant and cooperative  Skin: Clean and intact without signs of breakdown Neuro:     He is alert and oriented to person, place, and time.  Intact to light touch in all 4 extremities No increased tone felt and Hoffman's (-); no clonus  Musculoskeletal:    Cervical back: Neck supple. No tenderness.     Comments:  UE strength 5/5 B/L RLE HF, KE, KF, DF and PF 5-/5- just slightly weaker than L side LLE- 5/5    Assessment/Plan: 1. Functional deficits which require 3+ hours per day of interdisciplinary therapy in a comprehensive inpatient rehab setting. Physiatrist is providing close team supervision and 24 hour management of active medical problems listed below. Physiatrist and rehab team continue to assess barriers to discharge/monitor patient progress toward functional and medical goals  Care Tool:  Bathing    Body parts bathed by patient: Right arm, Left arm, Chest, Right lower leg, Left lower leg, Face, Abdomen, Front perineal area, Buttocks, Right upper leg, Left upper leg         Bathing assist Assist Level: Minimal Assistance - Patient > 75%     Upper Body Dressing/Undressing Upper body dressing   What is the patient wearing?: Pull over shirt    Upper body assist Assist Level: Supervision/Verbal cueing    Lower Body Dressing/Undressing Lower body dressing      What is the patient wearing?: Underwear/pull up, Pants     Lower body assist Assist for lower body dressing: Minimal Assistance - Patient > 75%     Toileting Toileting  Toileting assist Assist for toileting: Minimal Assistance - Patient > 75%     Transfers Chair/bed transfer  Transfers assist  Chair/bed transfer activity did not occur: Safety/medical concerns  Chair/bed transfer assist level: Minimal Assistance - Patient > 75%     Locomotion Ambulation   Ambulation assist      Assist level: Minimal Assistance - Patient > 75% Assistive device: Rollator Max distance: 150+   Walk 10 feet activity   Assist  Walk 10 feet activity did not occur: Safety/medical concerns  Assist level: Minimal Assistance - Patient > 75% Assistive device: Rollator   Walk 50 feet activity   Assist Walk 50 feet with 2 turns activity did not occur: Safety/medical concerns  Assist level: Minimal Assistance -  Patient > 75% Assistive device: Rollator    Walk 150 feet activity   Assist Walk 150 feet activity did not occur: Safety/medical concerns  Assist level: Minimal Assistance - Patient > 75% Assistive device: Rollator    Walk 10 feet on uneven surface  activity   Assist Walk 10 feet on uneven surfaces activity did not occur: Safety/medical concerns         Wheelchair     Assist Is the patient using a wheelchair?: No             Wheelchair 50 feet with 2 turns activity    Assist            Wheelchair 150 feet activity     Assist          Blood pressure (!) 142/88, pulse 65, temperature 98.4 F (36.9 C), temperature source Oral, resp. rate 16, height  (1.803 m), weight 102.2 kg, SpO2 100 %.  Medical Problem List and Plan: 1. 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  Antithrombotics: -DVT/anticoagulation:  Pharmaceutical: Lovenox- pt absolutely refuses any type of blood thinner, however willing to use SCDs             -antiplatelet therapy: N/A 3. Pain Management:  tylenol prn.  4. Mood/Behavior/Sleep: LCSW to follow for evaluation and support.              -antipsychotic agents: N/A 5. Neuropsych/cognition: This patient appears capable of making decisions on his own behalf. 6. Skin/Wound Care: Routine pressure relief measures.  7. Fluids/Electrolytes/Nutrition: Monitor I/O. Check CMET in am 8. MS  peudoexacerbation: Improving. Continue Dalfampridine and Dimethyl furmarate.  9.  HTN: Monitor BP TID--on proscar and amlodipine.              --continue to hold HCTZ/K dur  -4/17 restart lisinopril at    4/20 improving control.  consider increase lisinopril to 20 mg if needed      09/13/2022    3:25 AM 09/12/2022    8:12 PM 09/12/2022    3:52 PM  Vitals with  BMI  Systolic 142 128 235  Diastolic 88 79 81  Pulse 65 63 68    10.  T2DM: Hgb A1C- Monitor BS ac/hs--variable and question intake.  --Will continue to hold metformin.  -4/20 CBGs controlled continue to monitor  CBG (last 3)  Recent Labs    09/12/22 1628 09/12/22 2238 09/13/22 0616  GLUCAP 166* 102*  101*     11. H/o BPH: Continue proscar and flomax-->change flomax to evenings to avoid orthostatic changes.  -PVR 0-115 12. Constipation: Continue miralax bid. Continue senokot  -4/19 bowel movement --improved 13. OSA: Continue CPAP.  14. GERD:Symptoms managed with PPI 15. Thrombocytopenia: Monitor for signs of bleeding. Recheck plts in am. -4/17 up to 155 today   Recheck Monday  16. Droplet precautions- continue as respiratory symptoms have not resolved  -4/20 still has cough  -will schedule mucinex DM. Has robitussin prn 17. Hypokalemia  -4/17order  Kdur, recheck tomorrow  -4/18 3.5 today improved, give additional x1, recheck monday      LOS: 4 days A FACE TO FACE EVALUATION WAS PERFORMED  Ranelle Oyster 09/13/2022, 9:39 AM

## 2022-09-14 DIAGNOSIS — I10 Essential (primary) hypertension: Secondary | ICD-10-CM | POA: Diagnosis not present

## 2022-09-14 DIAGNOSIS — R058 Other specified cough: Secondary | ICD-10-CM | POA: Diagnosis not present

## 2022-09-14 DIAGNOSIS — E118 Type 2 diabetes mellitus with unspecified complications: Secondary | ICD-10-CM | POA: Diagnosis not present

## 2022-09-14 DIAGNOSIS — G35 Multiple sclerosis: Secondary | ICD-10-CM | POA: Diagnosis not present

## 2022-09-14 LAB — GLUCOSE, CAPILLARY
Glucose-Capillary: 87 mg/dL (ref 70–99)
Glucose-Capillary: 95 mg/dL (ref 70–99)
Glucose-Capillary: 109 mg/dL — ABNORMAL HIGH (ref 70–99)
Glucose-Capillary: 209 mg/dL — ABNORMAL HIGH (ref 70–99)

## 2022-09-14 NOTE — Progress Notes (Signed)
PROGRESS NOTE   Subjective/Complaints: Says cough much better today. Asked why we're cathing him. RN reports caths are low volume   Review of Systems  Constitutional:  Negative for chills, fever and malaise/fatigue.  HENT:  Positive for congestion.   Cardiovascular:  Negative for chest pain.  Gastrointestinal:  Positive for constipation. Negative for abdominal pain, nausea and vomiting.  Genitourinary: Negative.   Musculoskeletal:  Negative for joint pain.  Skin:  Negative for rash.  Neurological:  Positive for weakness. Negative for headaches.     Objective:   No results found. No results for input(s): "WBC", "HGB", "HCT", "PLT" in the last 72 hours.  No results for input(s): "NA", "K", "CL", "CO2", "GLUCOSE", "BUN", "CREATININE", "CALCIUM" in the last 72 hours.   Intake/Output Summary (Last 24 hours) at 09/14/2022 0818 Last data filed at 09/14/2022 0730 Gross per 24 hour  Intake 720 ml  Output 1650 ml  Net -930 ml         Physical Exam: Vital Signs Blood pressure (!) 140/83, pulse 63, temperature 98.5 F (36.9 C), temperature source Oral, resp. rate 16, height 5\' 11"  (1.803 m), weight 102.2 kg, SpO2 96 %.    Constitutional: No distress . Vital signs reviewed. HEENT: NCAT, EOMI, oral membranes moist Neck: supple Cardiovascular: RRR without murmur. No JVD    Respiratory/Chest: CTA Bilaterally without wheezes or rales. Normal effort    GI/Abdomen: BS +, non-tender, non-distended Ext: no clubbing, cyanosis, or edema Psych: pleasant and cooperative  Skin: Clean and intact without signs of breakdown Neuro:     He is alert and oriented to person, place, and time.  Intact to light touch in all 4 extremities No increased tone felt and Hoffman's (-); no clonus  Musculoskeletal:    Cervical back: Neck supple. No tenderness.     Comments: UE strength 5/5 B/L RLE HF, KE, KF, DF and PF 5-/5- just slightly weaker  than L side LLE- 5/5    Assessment/Plan: 1. Functional deficits which require 3+ hours per day of interdisciplinary therapy in a comprehensive inpatient rehab setting. Physiatrist is providing close team supervision and 24 hour management of active medical problems listed below. Physiatrist and rehab team continue to assess barriers to discharge/monitor patient progress toward functional and medical goals  Care Tool:  Bathing    Body parts bathed by patient: Right arm, Left arm, Chest, Right lower leg, Left lower leg, Face, Abdomen, Front perineal area, Buttocks, Right upper leg, Left upper leg         Bathing assist Assist Level: Minimal Assistance - Patient > 75%     Upper Body Dressing/Undressing Upper body dressing   What is the patient wearing?: Pull over shirt    Upper body assist Assist Level: Supervision/Verbal cueing    Lower Body Dressing/Undressing Lower body dressing      What is the patient wearing?: Underwear/pull up, Pants     Lower body assist Assist for lower body dressing: Minimal Assistance - Patient > 75%     Toileting Toileting    Toileting assist Assist for toileting: Minimal Assistance - Patient > 75%     Transfers Chair/bed transfer  Transfers assist  Chair/bed  transfer activity did not occur: Safety/medical concerns  Chair/bed transfer assist level: Minimal Assistance - Patient > 75%     Locomotion Ambulation   Ambulation assist      Assist level: Minimal Assistance - Patient > 75% Assistive device: Rollator Max distance: 150+   Walk 10 feet activity   Assist  Walk 10 feet activity did not occur: Safety/medical concerns  Assist level: Minimal Assistance - Patient > 75% Assistive device: Rollator   Walk 50 feet activity   Assist Walk 50 feet with 2 turns activity did not occur: Safety/medical concerns  Assist level: Minimal Assistance - Patient > 75% Assistive device: Rollator    Walk 150 feet  activity   Assist Walk 150 feet activity did not occur: Safety/medical concerns  Assist level: Minimal Assistance - Patient > 75% Assistive device: Rollator    Walk 10 feet on uneven surface  activity   Assist Walk 10 feet on uneven surfaces activity did not occur: Safety/medical concerns         Wheelchair     Assist Is the patient using a wheelchair?: No             Wheelchair 50 feet with 2 turns activity    Assist            Wheelchair 150 feet activity     Assist          Blood pressure (!) 140/83, pulse 63, temperature 98.5 F (36.9 C), temperature source Oral, resp. rate 16, height  (1.803 m), weight 102.2 kg, SpO2 96 %.  Medical Problem List and Plan: 1. 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  Antithrombotics: -DVT/anticoagulation:  Pharmaceutical: Lovenox- pt absolutely refuses any type of blood thinner, however willing to use SCDs             -antiplatelet therapy: N/A 3. Pain Management:  tylenol prn.  4. Mood/Behavior/Sleep: LCSW to follow for evaluation and support.              -antipsychotic agents: N/A 5. Neuropsych/cognition: This patient appears capable of making decisions on his own behalf. 6. Skin/Wound Care: Routine pressure relief measures.  7. Fluids/Electrolytes/Nutrition: Monitor I/O. Check CMET in am 8. MS  peudoexacerbation: Improving. Continue Dalfampridine and Dimethyl furmarate.  9.  HTN: Monitor BP TID--on proscar and amlodipine.              --continue to hold HCTZ/K dur  -4/17 restart lisinopril at    4/21 improving control.  consider increase lisinopril to 20 mg if needed      09/14/2022    6:22 AM 09/13/2022    6:11 PM 09/13/2022    3:06 PM  Vitals with BMI  Systolic 140 142 161  Diastolic 83 75 77  Pulse 63 64  67    10.  T2DM: Hgb A1C- Monitor BS ac/hs--variable and question intake.  --Will continue to hold metformin.  -4/21 CBGs controlled continue to monitor   -SSI coverage as needed CBG (last 3)  Recent Labs    09/13/22 1710 09/13/22 2057 09/14/22 0620  GLUCAP 141* 187* 87     11. H/o BPH: Continue proscar and flomax-->change flomax to evenings to avoid orthostatic  changes.  -PVR 0-150  4/21-dc PVR's and scans 12. Constipation: Continue miralax bid. Continue senokot  -4/20 bowel movement --improved 13. OSA: Continue CPAP.  14. GERD:Symptoms managed with PPI 15. Thrombocytopenia: Monitor for signs of bleeding. Recheck plts in am. -4/17 up to 155 today   Recheck Monday  16. Droplet precautions-   -4/21 cough better today    -continue scheduled mucinex DM. Has robitussin prn   -dc droplet precautions soon? 17. Hypokalemia  -4/17order  Kdur, recheck tomorrow  -4/18 3.5 today improved, give additional x1, recheck monday      LOS: 5 days A FACE TO FACE EVALUATION WAS PERFORMED  Ranelle Oyster 09/14/2022, 8:18 AM

## 2022-09-14 NOTE — Progress Notes (Signed)
Physical Therapy Session Note  Patient Details  Name: James Gibbs MRN: [id number] Date of Birth: 1955-07-16  Today's Date: 09/14/2022 PT Individual Time: 0800-0857, 4098-1191 PT Individual Time Calculation (min): 57 min, 44 min  Short Term Goals: Week 1:  PT Short Term Goal 1 (Week 1): STG=LTG due to ELOS  Skilled Therapeutic Interventions/Progress Updates:     Treatment Session 1  Pt supine in bed upon arrival. Pt agreeable to therapy. Pt denies any pain.   Pt incontinent of bladder. Pt performed supine to sit from hospital bed with [facility] elevated and supervision with increased time. Pt performed sit<>stand x3 to donn briefs/pants with CGA.   Pt ambulated with upright with CGA/min A room to day room, intermittent LOB noted due to R LE crossing L LE verbal cues provided "to not cross legs" as pt demos pigeon toed/scissoring gait. Pt ambulated in // bars 1x25' forwards and backwards with no UE support, pt demos decreased step length and shuffle gait with increased crouch with CGA ambulating forward and min A due to posterior LOB backwards.   Pt ambulated 4 x 25' forwards and backwards with unilateral UE support with CGA, pt demos improved upright posture, step length, and reciprocal gait, but continues to require light min A due to posterior LOB.   Pt performed B above head shoulder flexion x10 while seated EOM with 2# dowel rod with verbal cues to look at dowel rod when going above head to improve upright posture, and challenge posterior limits of stability. Pt performed same activity in standing with no AD with CGA for stability.   Pt ambulated with upright from main gym to room with CGA with tactile cues of upright to facilitate hip abduction and wider BOS with gait.  Therapist followed up with pt to determine if pt had any questions about bed level exercises. Pt reports he hasn't been performing them because he wasn't able to get HOB flat. Education provided on how to lower/raise  [facility] with pt remote. Pt returned demo.   Pt performed ambulatory transfer to bed with upright and supervision, pt performed sit to supine with supervision. Pt in bed at end of session with bed alarm on, and needs within reach.     Treatment Session 2   Pt supine in bed upon arrival. Pt performed supine to sit with independently, sit to stand with upright and CGA, verbal cues provided for technique. Pt reports his daughter wants him to use upright at all times at home, but he currently keeps it in car and sues it at grocery store and [place] as it is difficult to carry it down the stairs to get to his car. Pt reports he uses a SPC at home. Pt has SPC and quad cane in room. Assessed gait with both with min A 2x20 feet , with therapist providing verbal cues for sequencing with quad cane with 3 point gait pattern and cane in L UE. Pt demos improved sequencing and reciprocal versus step to gait with SPC>quad cane but demos and reports decreased stability. Education provided on improved posture and stability with upright compared to [facility], pt verbalized understanding and agrees.   Pt performed 5x 25 feet bilateral lateral steps over 4 cones, with B UE support on // bars, verbal cueing to reduce trunk rotation compensation and to allow adequate space for following LE as pt demos tendency to step on stance foot with following LE.   Pt ambulated with upright to room with CGA, pt demos no  LOB, and demos carryover of utilizing sides of upright as tactile cue for wider BOS.   Pt supine in bed at end of session with bed alarm on and all needs within reach.    Therapy Documentation Precautions:  Precautions Precautions: Fall Precaution Comments: chronic R sided weakness Restrictions Weight Bearing Restrictions: No  Therapy/Group: Individual Therapy  [facility] Winfield, Raymond, DPT  09/14/2022, 7:29 AM

## 2022-09-14 NOTE — Progress Notes (Signed)
Occupational Therapy Session Note  Patient Details  Name: James Gibbs MRN: [id number] Date of Birth: 05/26/1956  Today's Date: 09/14/2022 OT Individual Time: 1100-1200, 2:45-3:30 OT Individual Time Calculation (min): 60 min , 45 Mins   Short Term Goals: Week 1:  OT Short Term Goal 1 (Week 1): STG= LTG d/ ELOS  Skilled Therapeutic Interventions/Progress Updates:     (1st) OT Treatment Session: Patient seen this day for skilled OT to improve functional outcome. The pt complete BADL related task in bathing at sink LOF .  The pt was able to transfer from bed using the bed rail while completing a stand pivot transfer to the w/c with CGA.  The pt was able to use his feet to transfer to the sink area with close S.  The pt doffed his hospital gown with MinA for fasteners. He was able to bathe UB with s/u assist and CGA for LB while pull up at the sink for additional balance.  The pt was able to donn his overhead shirt with s/u assist and he was MinA for donning his pull-up and pants for threading his legs into the openings. The pt was MaxA for donn his non skid socks.  The pt was able to brush his teeth with s/u assist.  The pt indicated that he needed to go to the restroom and was transported to the bathroom.  The pt was able to come from sit to stand at [facility] using the grab bars, he was able to doff his LB  garments with MinA and he completed the toileting task with close S.  The pt transferred from the commode to standing using the grab bar and completed a stand pivot transfer to the w/c with CGA.  The pt returned to his living quarters and was able to transfer back to bed using the grab bar for additional balance and MinA for managing BLE.  The call light and beside table were within reach and all additional needs were addressed prior to exiting the room.    (2) OT Treatment Session:  Patient in bed at the time of arrival, the pt was in agreement with completing UB exercises .  The pt presents with  poor posture and was instructed in UB stretching exercise while seated EOB for trunk rotation to improve his anatomical position during static and dynamic standing activities . The pt completed the exercise 4x fwith a holding count of 10 for both sides. The pt went on to come from EOB to standing using the RW for additional balance while marching in place.  .  The pt completed dowel rod exercises using a 2lb dowel for shld flexion, horizontal abduction, shld rotation,and large circles. 2 sets of 15 with rest breaks as needed, the pt required 1 rest break following each exercise.  The pt completed the session by completing resistive exercise while hold the 2lb dowel in shld flexion 4x for a count of 10 with  external force exerted on the dowel with rest breaks as needed, the pt required 2 rest break. The pt returned to bed LOF following the exercise and was able to transfer from w/c LOF using the grab bar for additional balance and MinA for managing BLE.  The call light and bedside table were both in place and all additional needs were addressed prior to exiting the room. Therapy Documentation Precautions:  Precautions Precautions: Fall Precaution Comments: chronic R sided weakness Restrictions Weight Bearing Restrictions: No  Therapy/Group: Individual Therapy  Ilda Basset  Jean Rosenthal 09/14/2022, 4:47 PM

## 2022-09-15 DIAGNOSIS — K5900 Constipation, unspecified: Secondary | ICD-10-CM | POA: Diagnosis not present

## 2022-09-15 DIAGNOSIS — I10 Essential (primary) hypertension: Secondary | ICD-10-CM | POA: Diagnosis not present

## 2022-09-15 DIAGNOSIS — E119 Type 2 diabetes mellitus without complications: Secondary | ICD-10-CM | POA: Diagnosis not present

## 2022-09-15 DIAGNOSIS — G35 Multiple sclerosis: Secondary | ICD-10-CM | POA: Diagnosis not present

## 2022-09-15 LAB — GLUCOSE, CAPILLARY
Glucose-Capillary: 84 mg/dL (ref 70–99)
Glucose-Capillary: 99 mg/dL (ref 70–99)
Glucose-Capillary: 114 mg/dL — ABNORMAL HIGH (ref 70–99)
Glucose-Capillary: 136 mg/dL — ABNORMAL HIGH (ref 70–99)

## 2022-09-15 LAB — CBC
HCT: 38.1 % — ABNORMAL LOW (ref 39.0–52.0)
Hemoglobin: 12.8 g/dL — ABNORMAL LOW (ref 13.0–17.0)
MCH: 32.2 pg (ref 26.0–34.0)
MCHC: 33.6 g/dL (ref 30.0–36.0)
MCV: 95.7 fL (ref 80.0–100.0)
Platelets: 183 10*3/uL (ref 150–400)
RBC: 3.98 MIL/uL — ABNORMAL LOW (ref 4.22–5.81)
RDW: 13.8 % (ref 11.5–15.5)
WBC: 6.1 10*3/uL (ref 4.0–10.5)
nRBC: 0 % (ref 0.0–0.2)

## 2022-09-15 LAB — BASIC METABOLIC PANEL
Anion gap: 9 (ref 5–15)
BUN: 12 mg/dL (ref 8–23)
CO2: 27 mmol/L (ref 22–32)
Calcium: 8.5 mg/dL — ABNORMAL LOW (ref 8.9–10.3)
Chloride: 106 mmol/L (ref 98–111)
Creatinine, Ser: 1.03 mg/dL (ref 0.61–1.24)
GFR, Estimated: >60 mL/min (ref >60)
Glucose, Bld: 100 mg/dL — ABNORMAL HIGH (ref 70–99)
Potassium: 3.4 mmol/L — ABNORMAL LOW (ref 3.5–5.1)
Sodium: 142 mmol/L (ref 135–145)

## 2022-09-15 LAB — MAGNESIUM: Magnesium: 1.9 mg/dL (ref 1.7–2.4)

## 2022-09-15 MED ORDER — POTASSIUM CHLORIDE CRYS ER 10 MEQ PO TBCR: 10.0000 meq | EXTENDED_RELEASE_TABLET | Freq: Every day | ORAL | Status: DC

## 2022-09-15 MED ORDER — TAMSULOSIN HCL 0.4 MG PO CAPS: 0.8000 mg | ORAL_CAPSULE | Freq: Every day | ORAL | Status: AC

## 2022-09-15 MED ORDER — POTASSIUM CHLORIDE CRYS ER 20 MEQ PO TBCR
30.0000 meq | EXTENDED_RELEASE_TABLET | Freq: Once | ORAL | Status: AC
  Administered 2022-09-15: 30 meq via ORAL
  Filled 2022-09-15: qty 1

## 2022-09-15 MED ORDER — METFORMIN HCL ER 500 MG PO TB24
500.0000 mg | ORAL_TABLET | Freq: Every day | ORAL | Status: DC
  Administered 2022-09-16 – 2022-09-20 (×5): 500 mg via ORAL
  Filled 2022-09-15 (×5): qty 1

## 2022-09-15 MED ORDER — POLYVINYL ALCOHOL 1.4 % OP SOLN: 1.0000 [drp] | Freq: Four times a day (QID) | OPHTHALMIC | 0 refills | Status: DC

## 2022-09-15 MED ORDER — POTASSIUM CHLORIDE CRYS ER 10 MEQ PO TBCR: 10.0000 meq | EXTENDED_RELEASE_TABLET | Freq: Two times a day (BID) | ORAL | Status: DC

## 2022-09-15 MED ORDER — MAGNESIUM OXIDE -MG SUPPLEMENT 400 (240 MG) MG PO TABS
200.0000 mg | ORAL_TABLET | Freq: Every day | ORAL | Status: DC
  Administered 2022-09-15 – 2022-09-17 (×3): 200 mg via ORAL
  Filled 2022-09-15 (×3): qty 1

## 2022-09-15 MED ORDER — POTASSIUM CHLORIDE CRYS ER 10 MEQ PO TBCR
10.0000 meq | EXTENDED_RELEASE_TABLET | Freq: Every day | ORAL | Status: DC
  Administered 2022-09-16 – 2022-09-20 (×5): 10 meq via ORAL
  Filled 2022-09-15 (×5): qty 1

## 2022-09-15 NOTE — Progress Notes (Signed)
PROGRESS NOTE   Subjective/Complaints: Seen at the bedside.  Patient has no new concerns this morning.  Reports he continues to have nasal congestion but it is gradually improving.   Review of Systems  Constitutional:  Negative for chills, fever and malaise/fatigue.  HENT:  Positive for congestion.   Respiratory:  Positive for cough and sputum production.   Cardiovascular:  Negative for chest pain.  Gastrointestinal:  Negative for abdominal pain, constipation, nausea and vomiting.  Genitourinary: Negative.   Musculoskeletal:  Negative for joint pain.  Skin:  Negative for rash.  Neurological:  Positive for weakness. Negative for headaches.     Objective:   No results found. Recent Labs    09/15/22 0540  WBC 6.1  HGB 12.8*  HCT 38.1*  PLT 183    Recent Labs    09/15/22 0540  NA 142  K 3.4*  CL 106  CO2 27  GLUCOSE 100*  BUN 12  CREATININE 1.03  CALCIUM 8.5*     Intake/Output Summary (Last 24 hours) at 09/15/2022 0824 Last data filed at 09/15/2022 0730 Gross per 24 hour  Intake 720 ml  Output 2875 ml  Net -2155 ml         Physical Exam: Vital Signs Blood pressure 129/87, pulse 64, temperature 98.6 F (37 C), temperature source Axillary, resp. rate 18, height  (1.803 m), weight 102.2 kg, SpO2 100 %.    Constitutional: No distress . Vital signs reviewed. Lying in bed HEENT: NCAT, EOMI, oral membranes moist Neck: supple Cardiovascular: RRR without murmur. No JVD    Respiratory/Chest: CTA Bilaterally without wheezes or rales. Normal effort    GI/Abdomen: BS +, non-tender, non-distended Ext: no clubbing, cyanosis, or edema Psych: pleasant and cooperative  Skin:  warm, dry and intact Neuro:     He is alert and oriented to person, place, and time.  Intact to light touch in all 4 extremities No increased tone felt  Musculoskeletal:    Cervical back: Neck supple. No tenderness.      Comments: UE strength 5/5 B/L RLE HF, KE, KF, DF and PF 5-/5- just slightly weaker than L side LLE- 5/5    Assessment/Plan: 1. Functional deficits which require 3+ hours per day of interdisciplinary therapy in a comprehensive inpatient rehab setting. Physiatrist is providing close team supervision and 24 hour management of active medical problems listed below. Physiatrist and rehab team continue to assess barriers to discharge/monitor patient progress toward functional and medical goals  Care Tool:  Bathing    Body parts bathed by patient: Right arm, Left arm, Chest, Right lower leg, Left lower leg, Face, Abdomen, Front perineal area, Buttocks, Right upper leg, Left upper leg         Bathing assist Assist Level: Minimal Assistance - Patient > 75%     Upper Body Dressing/Undressing Upper body dressing   What is the patient wearing?: Pull over shirt    Upper body assist Assist Level: Supervision/Verbal cueing    Lower Body Dressing/Undressing Lower body dressing      What is the patient wearing?: Underwear/pull up, Pants     Lower body assist Assist for lower body dressing: Minimal Assistance -  Patient > 75%     Editor, commissioning assist Assist for toileting: Minimal Assistance - Patient > 75%     Transfers Chair/bed transfer  Transfers assist  Chair/bed transfer activity did not occur: Safety/medical concerns  Chair/bed transfer assist level: Minimal Assistance - Patient > 75%     Locomotion Ambulation   Ambulation assist      Assist level: Minimal Assistance - Patient > 75% Assistive device: Rollator Max distance: 150+   Walk 10 feet activity   Assist  Walk 10 feet activity did not occur: Safety/medical concerns  Assist level: Minimal Assistance - Patient > 75% Assistive device: Rollator   Walk 50 feet activity   Assist Walk 50 feet with 2 turns activity did not occur: Safety/medical concerns  Assist level: Minimal  Assistance - Patient > 75% Assistive device: Rollator    Walk 150 feet activity   Assist Walk 150 feet activity did not occur: Safety/medical concerns  Assist level: Minimal Assistance - Patient > 75% Assistive device: Rollator    Walk 10 feet on uneven surface  activity   Assist Walk 10 feet on uneven surfaces activity did not occur: Safety/medical concerns         Wheelchair     Assist Is the patient using a wheelchair?: No             Wheelchair 50 feet with 2 turns activity    Assist            Wheelchair 150 feet activity     Assist          Blood pressure 129/87, pulse 64, temperature 98.6 F (37 C), temperature source Axillary, resp. rate 18, height  (1.803 m), weight 102.2 kg, SpO2 100 %.  Medical Problem List and Plan: 1. 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  Antithrombotics: -DVT/anticoagulation:  Pharmaceutical: Lovenox- pt absolutely refuses any type of blood thinner, however willing to use SCDs             -antiplatelet therapy: N/A 3. Pain Management:  tylenol prn.  4. Mood/Behavior/Sleep: LCSW to follow for evaluation and support.              -antipsychotic agents: N/A 5. Neuropsych/cognition: This patient appears capable of making decisions on his own behalf. 6. Skin/Wound Care: Routine pressure relief measures.  7. Fluids/Electrolytes/Nutrition: Monitor I/O. Check CMET in am 8. MS  peudoexacerbation: Improving. Continue Dalfampridine and Dimethyl furmarate.  9.  HTN: Monitor BP TID--on proscar and amlodipine.              --continue to hold HCTZ/K dur  -4/17 restart lisinopril at    4/21 improving control.  consider increase lisinopril to 20 mg if needed  4/22 controlled overall, continue to monitor for now      09/15/2022     6:18 AM 09/14/2022    6:14 PM 09/14/2022    4:11 PM  Vitals with BMI  Systolic 129 149 161  Diastolic 87 87 90  Pulse 64 69 70    10.  T2DM: Hgb A1C- Monitor BS ac/hs--variable and question intake.  --Will continue to hold metformin.  -4/21 CBGs controlled continue to monitor   -  SSI coverage as needed  -restart his home dose metformin 500mg  24hr CBG (last 3)  Recent Labs    09/14/22 1609 09/14/22 2047 09/15/22 0611  GLUCAP 109* 209* 84     11. H/o BPH: Continue proscar and flomax-->change flomax to evenings to avoid orthostatic changes.  -PVR 0-150  4/21-dc PVR's and scans 12. Constipation: Continue miralax bid. Continue senokot  -4/22 LBM 2 days ago, continue miralax, consider additional medication if no BM today 13. OSA: Continue CPAP.  14. GERD:Symptoms managed with PPI 15. Thrombocytopenia: Monitor for signs of bleeding. Recheck plts in am. -4/17 up to 155 today   -4/22 stable at 183 today  16. Droplet precautions-   -4/21 cough better today    -continue scheduled mucinex DM. Has robitussin prn   -dc droplet precautions soon? 17. Hypokalemia  -4/17order 40mg  Kdur, recheck tomorrow  -4/18 3.5 today improved, give additional x1, recheck Monday  -4/19 Kdur today, daily starting tomorrow as K+ continues to be low      LOS: 6 days A FACE TO FACE EVALUATION WAS PERFORMED  Fanny Dance 09/15/2022, 8:24 AM

## 2022-09-15 NOTE — Discharge Instructions (Addendum)
Inpatient Rehab Discharge Instructions  James Gibbs Discharge date and time:    Activities/Precautions/ Functional Status: Activity: no lifting, driving, or strenuous exercise till cleared by MD Diet: regular diet Wound Care: none needed   Functional status:  ___ No restrictions     ___ Walk up steps independently ___ 24/7 supervision/assistance   ___ Walk up steps with assistance ___ Intermittent supervision/assistance  ___ Bathe/dress independently ___ Walk with walker     ___ Bathe/dress with assistance ___ Walk Independently    ___ Shower independently ___ Walk with assistance    ___ Shower with assistance ___ No alcohol     ___ Return to work/school ________  COMMUNITY REFERRALS UPON DISCHARGE:     Outpatient: PT     OT                [facility] Phone:              Appointment Date/Time: PCP will confirm OP location .     Special Instructions:    My questions have been answered and I understand these instructions. I will adhere to these goals and the provided educational materials after my discharge from the hospital.  Patient/Caregiver Signature _______________________________ Date __________  Clinician Signature _______________________________________ Date __________  Please bring this form and your medication list with you to all your follow-up doctor's appointments.

## 2022-09-15 NOTE — Progress Notes (Signed)
Occupational Therapy Session Note  Patient Details  Name: James Gibbs MRN: [id number] Date of Birth: 04-Mar-1956   Session 1 Today's Date: 09/15/2022 OT Individual Time: 4098-1191 OT Individual Time Calculation (min): 22 min    Session 2 Today's Date: 09/15/2022 OT Individual Time: 1415-1500 OT Individual Time Calculation (min): 45 min    Short Term Goals: Week 1:  OT Short Term Goal 1 (Week 1): STG= LTG d/ ELOS  Skilled Therapeutic Interventions/Progress Updates:    Session 1 Pt received supine with no c/o pain, agreeable to OT session. Session focused on practicing with various types of DME during functional mobility for IADL simulation. He used the upright rollator to stand from EOB and then functional mobility to the gym, 100 ft, with close (S). He was able to fold up and lift his rollator to place it on the mat to simulate putting it in his car. CGA provided for this. He then used a SPC to complete about 15 ft of functional mobility to a shopping cart with CGA before placing the cane in the cart and pushing the cart 100 ft to simulate grocery shopping. Will plan for community outing to practice this in more realistic setting. Pt was passed off to PT in gym.    Session 2 Pt received supine with no c/o pain, agreeable to OT session. Session focused on ADL retraining at shower level. He came to EOB with (S) and doffed all clothing. He stood and used the cane for functional mobility to the shower. Pt very unsteady and is much safer with the rollator but this is preferred method so continue to practice. CGA overall, no LOB. He transferred to the [facility] in the shower with CGA. He completed all bathing with set up assist. Incontinent BM in shower. Pt still demonstrates some unsafe behaviors with use/non use of his recommended DME. He attempted to initiate functional mobility to the EOB with no AD at all. Provided stern recommendations to use cane at the very least but prefer rollator when  alone. He used the [facility] for mobility to the sink where he then sat in the rollator to complete oral care and to dress. D/t height of the rollator he required min A to don pants over feet. He returned to EOB and was left supine with all needs met. Friend present. Bed alarm set.    Therapy Documentation Precautions:  Precautions Precautions: Fall Precaution Comments: chronic R sided weakness Restrictions Weight Bearing Restrictions: No  Therapy/Group: Individual Therapy  Crissie Reese 09/15/2022, 6:52 AM

## 2022-09-15 NOTE — Progress Notes (Signed)
Physical Therapy Session Note  Patient Details  Name: James Gibbs MRN: [id number] Date of Birth: 22-May-1956  Today's Date: 09/15/2022 PT Individual Time: 0920-1017 PT Individual Time Calculation (min): 57 min   Short Term Goals: Week 1:  PT Short Term Goal 1 (Week 1): STG=LTG due to ELOS  Skilled Therapeutic Interventions/Progress Updates:    Pt presents in main gym seated EOM, handoff from OT. Pt denies pain, communication with OT reports pt wanting to work on using cane to get to/from grocery cart and getting upwalker in and out of car by practicing on/off mat. Pt also reporting using recumbent bike at home for 1 hour a day, requests to complete during therapy session. Pt completes sit to stand transfers with CGA throughout session with various degrees of UE support.  Pt ambulates 89' and 96' with SPC, CGA/min assist pt demonstrating knee flexion in stance phase more significant on RLE, excessive hip flexion and anterior trunk lean, able to correct minimally with verbal cueing, increased clonus noted with R stance phase. Pt reports this is baseline mobility however does wish to improve pain in back and knees to be able to improve posture.  Pt able to manage upwalker off of mat and unfold with CGA/min assist, pt with intermittent unilateral and no UE support, completed to promote improved independence with managing upwalker in and out of car.  Pt ambulates ~100' from main gym to ortho gym with upwalker CGA, demonstrating improved BOS minimal scissoring noted without distractions.  Pt completes nustep 10:08 for total 610 steps, verbal cues to maintain speed above 60SPM with pt maintaining well if not distracted. For last 40 seconds pt cued to go "as fast as he can" pt reporting RPE 15. Therapist provides cues for terminal knee extension on RLE throughout exercise to decrease spasticity and improved stability with gait and transfers.  Pt completes side stepping 3x10' R/L with [facility] CGA/min  assist, demonstrating improved stability with L side stepping, pt demonstrating increased difficulty with clearing R foot with L side stepping.  Pt educated on spasticity and how that relates to posture and stability, provided with education on completing stretches in morning as well as exercise of bike to improve mobility and stability with pt verbalizing understanding.   Pt completes sit to supine to prone with supervision, pillow placed under hips to promote improved tolerance to prone position. Pt maintains prone x28min to stretch hip flexors and decrease back pain with terminal hip extension with pt reporting feeling better in prone. Pt provided with verbal cues to complete glute sets in prone throughout session to facilitate posterior chain activation while in neutral hip position. Pt completes prone to supine to sit with supervision following activity.  Pt ambulates back to room with upwalker CGA, stops ambulating with distraction requiring verbal cues for attendance to task. Pt returns to supine in bed with all needs within reach, call light in place, and bed alarm activated at end of session.  Therapy Documentation Precautions:  Precautions Precautions: Fall Precaution Comments: chronic R sided weakness Restrictions Weight Bearing Restrictions: No     Therapy/Group: Individual Therapy  Edwin Cap PT, DPT 09/15/2022, 12:30 PM

## 2022-09-15 NOTE — Progress Notes (Signed)
Physical Therapy Session Note  Patient Details  Name: James Gibbs MRN: [id number] Date of Birth: 12-24-1955  Today's Date: 09/15/2022 PT Individual Time: 1100-1156 PT Individual Time Calculation (min): 56 min   Short Term Goals: Week 1:  PT Short Term Goal 1 (Week 1): STG=LTG due to ELOS  Skilled Therapeutic Interventions/Progress Updates:      Pt supine in bed upon arrival. Pt performed supine to sit independently, pt donned shoes while sitting EOB independently. Pt performed sit<>stand with verbal cuing, cuing provided for technique to reduce posterior lean. Pt ambulated from room to apartment gym with upright with supervision, verbal cues provided for not crossing legs.   Therapist and pt reviewed pt daily routine and assistive devices used to perform daily routine.   Pt reports he does not use an AD in the room, and that he furniture walks, discussed pt utilizing a SPC, pt agreed. Pt ambulated from bed to kitchen (to simulate hallway) with SPC and CGA, then grabbed the rollator and ambulated throughout kitchen (to simulate walking in common areas of house, as pt uses rollator in kitchen/living room). Pt performed x2 switching devices with CGA and verbal cues for safety. Pt ambulated from kitchen to rocking chair as pt sits in recliner chair at home. Pt performed sit to stand from rocking chair with supervision, with verbal cues for safety. Pt reports he eats cereal every morning and carries bowl of cereal in one hand and holds rollator in other hand and walks from kitchen to couch. Discussed pt placing bowl of cereal on seat of rollator, pt practiced reaching for bowl out of above head cabinet, grabbing milk out of fridge, and placing bowl on rollator and balancing it to prevent it from spilling when walking over threshold to couch, pt performed with supervision, verbal cues for safety.   Pt ambulated from room to main gym stairs with rollator and supervision, pt demos improved BOS  today. Pt reports at home he uses cane to get from house to car. Pt ascended/descended 12 6 inch steps with R UE support on cane and L UE support on handrail with CGA with reciprocal gait and supervision for step to gait, verbal cues provided for step to gait versus reciprocal gait for stability.   Assessed pt ambulation with gait in L UE versus R UE, pt demos increased reciprocal gait with SPC in R UE versus L UE as pt has walked for years with SPC in R UE.   Pt ambulated from main gym to room with upright and supervision, verbal cues provided for safety.   Pt performed sit <> supine independently.   Pt seated in bed with bed alarm on, and needs within reach and nurse tech in room at end of session.  Therapy Documentation Precautions:  Precautions Precautions: Fall Precaution Comments: chronic R sided weakness Restrictions Weight Bearing Restrictions: No    Therapy/Group: Individual Therapy  [facility] Ambrose Finland, [place], DPT  09/15/2022, 7:43 AM

## 2022-09-16 DIAGNOSIS — K5900 Constipation, unspecified: Secondary | ICD-10-CM | POA: Diagnosis not present

## 2022-09-16 DIAGNOSIS — Z794 Long term (current) use of insulin: Secondary | ICD-10-CM

## 2022-09-16 DIAGNOSIS — G35 Multiple sclerosis: Secondary | ICD-10-CM | POA: Diagnosis not present

## 2022-09-16 DIAGNOSIS — E119 Type 2 diabetes mellitus without complications: Secondary | ICD-10-CM | POA: Diagnosis not present

## 2022-09-16 DIAGNOSIS — I10 Essential (primary) hypertension: Secondary | ICD-10-CM | POA: Diagnosis not present

## 2022-09-16 LAB — GLUCOSE, CAPILLARY
Glucose-Capillary: 127 mg/dL — ABNORMAL HIGH (ref 70–99)
Glucose-Capillary: 109 mg/dL — ABNORMAL HIGH (ref 70–99)
Glucose-Capillary: 128 mg/dL — ABNORMAL HIGH (ref 70–99)

## 2022-09-16 MED ORDER — LISINOPRIL 20 MG PO TABS
20.0000 mg | ORAL_TABLET | Freq: Every day | ORAL | Status: DC
  Administered 2022-09-17 – 2022-09-19 (×3): 20 mg via ORAL
  Filled 2022-09-16 (×3): qty 1

## 2022-09-16 NOTE — Progress Notes (Addendum)
PROGRESS NOTE   Subjective/Complaints: No new concerns this AM. Reports his cough and congestion continues to improve. Reports therapy is going well overall.    Review of Systems  Constitutional:  Negative for fever.  HENT:  Positive for congestion.   Respiratory:  Positive for cough and sputum production.   Cardiovascular:  Negative for chest pain.  Gastrointestinal:  Negative for abdominal pain, constipation, nausea and vomiting.  Genitourinary: Negative.   Musculoskeletal:  Negative for joint pain and myalgias.  Neurological:  Positive for weakness. Negative for headaches.  Psychiatric/Behavioral:  Negative for depression.      Objective:   No results found. Recent Labs    09/15/22 0540  WBC 6.1  HGB 12.8*  HCT 38.1*  PLT 183    Recent Labs    09/15/22 0540  NA 142  K 3.4*  CL 106  CO2 27  GLUCOSE 100*  BUN 12  CREATININE 1.03  CALCIUM 8.5*     Intake/Output Summary (Last 24 hours) at 09/16/2022 1258 Last data filed at 09/16/2022 1241 Gross per 24 hour  Intake 1320 ml  Output 1250 ml  Net 70 ml         Physical Exam: Vital Signs Blood pressure (!) 144/88, pulse 72, temperature 97.7 F (36.5 C), resp. rate 16, height 5\' 11"  (1.803 m), weight 102.2 kg, SpO2 100 %.    Constitutional: No distress . Vital signs reviewed. Lying in bed HEENT: NCAT, EOMI, oral membranes moist Neck: supple Cardiovascular: RRR without murmur. No JVD    Respiratory/Chest: CTA Bilaterally without wheezes or rales. Normal effort    GI/Abdomen: BS +, non-tender, non-distended Ext: no clubbing, cyanosis, or edema Psych: pleasant and cooperative  Skin:  warm, dry and intact Neuro:     He is alert and oriented to person, place, and time.  Intact to light touch in all 4 extremities Follows commands, CN 2-12 grossly intact  Musculoskeletal:    Cervical back: Neck supple. No tenderness.     Comments: UE strength 5/5  B/L RLE HF, KE, KF, DF and PF 5-/5- just slightly weaker than L side LLE- 5/5    Assessment/Plan: 1. Functional deficits which require 3+ hours per day of interdisciplinary therapy in a comprehensive inpatient rehab setting. Physiatrist is providing close team supervision and 24 hour management of active medical problems listed below. Physiatrist and rehab team continue to assess barriers to discharge/monitor patient progress toward functional and medical goals  Care Tool:  Bathing    Body parts bathed by patient: Right arm, Left arm, Chest, Right lower leg, Left lower leg, Face, Abdomen, Front perineal area, Buttocks, Right upper leg, Left upper leg         Bathing assist Assist Level: Minimal Assistance - Patient > 75%     Upper Body Dressing/Undressing Upper body dressing   What is the patient wearing?: Pull over shirt    Upper body assist Assist Level: Supervision/Verbal cueing    Lower Body Dressing/Undressing Lower body dressing      What is the patient wearing?: Underwear/pull up, Pants     Lower body assist Assist for lower body dressing: Minimal Assistance - Patient > 75%  Toileting Toileting    Toileting assist Assist for toileting: Minimal Assistance - Patient > 75%     Transfers Chair/bed transfer  Transfers assist  Chair/bed transfer activity did not occur: Safety/medical concerns  Chair/bed transfer assist level: Minimal Assistance - Patient > 75%     Locomotion Ambulation   Ambulation assist      Assist level: Minimal Assistance - Patient > 75% Assistive device: Rollator Max distance: 150+   Walk 10 feet activity   Assist  Walk 10 feet activity did not occur: Safety/medical concerns  Assist level: Minimal Assistance - Patient > 75% Assistive device: Rollator   Walk 50 feet activity   Assist Walk 50 feet with 2 turns activity did not occur: Safety/medical concerns  Assist level: Minimal Assistance - Patient >  75% Assistive device: Rollator    Walk 150 feet activity   Assist Walk 150 feet activity did not occur: Safety/medical concerns  Assist level: Minimal Assistance - Patient > 75% Assistive device: Rollator    Walk 10 feet on uneven surface  activity   Assist Walk 10 feet on uneven surfaces activity did not occur: Safety/medical concerns         Wheelchair     Assist Is the patient using a wheelchair?: No             Wheelchair 50 feet with 2 turns activity    Assist            Wheelchair 150 feet activity     Assist          Blood pressure (!) 144/88, pulse 72, temperature 97.7 F (36.5 C), resp. rate 16, height  (1.803 m), weight 102.2 kg, SpO2 100 %.  Medical Problem List and Plan: 1. 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  Antithrombotics: -DVT/anticoagulation:  Pharmaceutical: Lovenox- pt absolutely refuses any type of blood thinner, however willing to use SCDs             -antiplatelet therapy: N/A 3. Pain Management:  tylenol prn.  4. Mood/Behavior/Sleep: LCSW to follow for evaluation and support.              -antipsychotic agents: N/A 5. Neuropsych/cognition: This patient appears capable of making decisions on his own behalf. 6. Skin/Wound Care: Routine pressure relief measures.  7. Fluids/Electrolytes/Nutrition: Monitor I/O. Check CMET in am 8. MS  peudoexacerbation: Improving. Continue Dalfampridine and Dimethyl furmarate.   9.  HTN: Monitor BP TID--on proscar and amlodipine.              --continue to hold HCTZ/K dur  -4/17 restart lisinopril at    4/21 improving control.  consider increase lisinopril to 20 mg if needed  4/23 Increase lisinopril to        09/16/2022    5:28 AM 09/15/2022    7:40 PM 09/15/2022    1:08 PM  Vitals  with BMI  Systolic 144 147 161  Diastolic 88 88 85  Pulse 72 69 72    10.  T2DM: Hgb A1C- Monitor BS ac/hs--variable and question intake.  --Will continue to hold metformin.  -4/21 CBGs controlled continue to monitor   -SSI coverage as needed  -restart his home  dose metformin  24hr  4/23 well controlled CBGS, continue to moniotr  CBG (last 3)  Recent Labs    09/15/22 2129 09/16/22 0734 09/16/22 1129  GLUCAP 136* 127* 109*     11. H/o BPH: Continue proscar and flomax-->change flomax to evenings to avoid orthostatic changes.  -PVR 0-150  4/21-dc PVR's and scans 12. Constipation: Continue miralax bid. Continue senokot  -4/22 LBM 2 days ago, continue miralax, consider additional medication if no BM today  -4/23 reports BM yesterday  13. OSA: Continue CPAP.   14. GERD:Symptoms managed with PPI  15. Thrombocytopenia: Monitor for signs of bleeding. Recheck plts in am. -4/17 up to 155 today   -4/22 stable at 183 today   16. Droplet precautions-   -4/21 cough better today    -continue scheduled mucinex DM. Has robitussin prn   -Need to continue droplet precautions until symptoms resolved  17. Hypokalemia  -4/17order  Kdur, recheck tomorrow  -4/18 3.5 today improved, give additional x1, recheck Monday  -4/22 Kdur today, daily starting tomorrow as K+ continues to be low  -4/23 Recheck BMP tomorrow AM      LOS: 7 days A FACE TO FACE EVALUATION WAS PERFORMED  Fanny Dance 09/16/2022, 12:58 PM

## 2022-09-16 NOTE — Progress Notes (Signed)
Patient ID: James Gibbs, male   DOB: [birth year], 67 y.o.   MRN: [id number]  Covering for primary Sw, Becky D.  Sw waiting on d/c recommendations. Sw followed up with the team.

## 2022-09-16 NOTE — Progress Notes (Signed)
HS snack given to pt.   James Gibbs

## 2022-09-16 NOTE — Progress Notes (Signed)
Occupational Therapy Session Note  Patient Details  Name: James Gibbs MRN: [id number] Date of Birth: July 30, 1955  Today's Date: 09/16/2022 OT Individual Time: 0950-1100 OT Individual Time Calculation (min): 70 min    Short Term Goals: Week 1:  OT Short Term Goal 1 (Week 1): STG= LTG d/ ELOS  Skilled Therapeutic Interventions/Progress Updates:    Pt received supine with no c/o pain, agreeable to OT session. He came to EOB with mod I. He donned pants with (S) sit <> stand, as well as shoes. He stood with the upright rollator with close (S)- 2x attempts required. He completed functional mobility to the sink, requiring extra close supervision and time to bring rollator behind him to sit down for oral care. He completed functional mobility to the therapy gym with close (S) using upright rollator. He completed blocked practice sit <> stand from the mat with and without UE support to challenge independence and strength with ADL transfers. 3x8 repetitions with rest breaks between. Pt completed standing level reciprocal tapping activity with SPC using a 4 in step 3x20 repetitions. Activity performed to challenge dynamic standing balance and functional activity tolerance to simulate household threshold management and reduce fall risk. Pt required min A at the trunk for balance support. He completed 200 ft of functional mobility with the upright rollator with close (S). He returned to his room and was left supine with all needs, bed alarm set.    Therapy Documentation Precautions:  Precautions Precautions: Fall Precaution Comments: chronic R sided weakness Restrictions Weight Bearing Restrictions: No  Therapy/Group: Individual Therapy  Crissie Reese 09/16/2022, 6:24 AM

## 2022-09-16 NOTE — Progress Notes (Signed)
Physical Therapy Session Note  Patient Details  Name: James Gibbs MRN: [id number] Date of Birth: [birth year]  Today's Date: 09/16/2022 PT Individual Time: 4098-1191 PT Individual Time Calculation (min): 58 min   Short Term Goals: Week 1:  PT Short Term Goal 1 (Week 1): STG=LTG due to ELOS  Skilled Therapeutic Interventions/Progress Updates:     Pt supine in bed upon arrival. Pt agreeable to therapy. Pt denies any pain.   Pt performed supine to sit with supervision, and sit to stand with CGA. Pt ambulated to table with Rollator to grab mask, with verbal cues provided for safety for reaching and CGA for stability.   Pt ambulated from room to gym with Rollator and CGA, verbal cues for R foot clearance as pt demos 2 epsiodes of LOB requiring CGA-min A for stability.   Pt negotiated obstacles in gym with Rollator and CGA to find numbered pads located throughout at gym at multiple heights, verbal cues provided for safety with reaching. Verbal cuing provided to stay closer to Rollator.  Pt ascended/descended 12 6 inch steps with SPC with step to gait, ascending leading with L and descending leading with R with cane in L UE and R UE support on HR.   Pt ambulated with 2x30 feet with SPC in L UE, with CGA to min A, pt demos improved sequencing and reciporcal gait today, but demos LE shakiness on 2nd trial due to fatigue. Pt ambulated 2x30 feet with SPC in R UE with CGA to min A. Today pt demos increased   Pt ambulated 150 feet with rollator with CGA and 2.5# ankle weight, pt demos improved R LE foot clearance.   Pt ambulated from main gym to no room with Rollator and CGA, pt demos improved carryover of R LE foot clearance, but slapping R LE on floor, likely due to fatigue. Pt performed ambulatory transfer to bed with supervision.   Pt supine in bed at end of session with bed alarm on and all needs within reach.    Therapy Documentation Precautions:  Precautions Precautions:  Fall Precaution Comments: chronic R sided weakness Restrictions Weight Bearing Restrictions: No   Therapy/Group: Individual Therapy  [facility] Ambrose Finland, [place], DPT  09/16/2022, 7:38 AM

## 2022-09-16 NOTE — Progress Notes (Signed)
Physical Therapy Session Note  Patient Details  Name: James Gibbs MRN: [id number] Date of Birth: November 08, 1955  Today's Date: 09/16/2022 PT Individual Time: 9563-8756  PT Individual Time Calculation (min): 83 min   Short Term Goals: Week 1:  PT Short Term Goal 1 (Week 1): STG=LTG due to ELOS  Skilled Therapeutic Interventions/Progress Updates:   Chart reviewed and pt agreeable to therapy. Pt received semi-reclined in bed with no c/o pain. Session focused on amb endurance and quality, endurance training and balance to promote safe home mobility . Pt initiated session with sit to stand and amb of 22ft to therapy gym using CGA + upright RW. Pt noted to require mod VC to avoid scissoring. Pt then completed 12 mins of interval training on NuStep for workloads 5-10 with emphasis on education for including interval training in regular, self-reported cardio exercise in the home.  Pt then completed 4 rounds of cornhole with various throwing techniques using CGA for dynamic standing balance. Pt also completed 2x10 alt UE overhead presses with 2# DB to promote posterior chain activation. Pt then amb 264ft back to room with CGA + upright RW + frequent VC to avoid scissoring. In room, PT gave extensive education to pt on reason for lapbelt and alarm safety, with pt agreeable. At end of session, pt was left seated in recliner with alarm engaged, nurse call bell and all needs in reach.  Therapy Documentation Precautions:  Precautions Precautions: Fall Precaution Comments: chronic R sided weakness Restrictions Weight Bearing Restrictions: No   Therapy/Group: Individual Therapy  Dionne Milo, PT, DPT 09/16/2022, 4:39 PM

## 2022-09-17 DIAGNOSIS — R058 Other specified cough: Secondary | ICD-10-CM | POA: Diagnosis not present

## 2022-09-17 DIAGNOSIS — I10 Essential (primary) hypertension: Secondary | ICD-10-CM | POA: Diagnosis not present

## 2022-09-17 DIAGNOSIS — G35 Multiple sclerosis: Secondary | ICD-10-CM | POA: Diagnosis not present

## 2022-09-17 DIAGNOSIS — E118 Type 2 diabetes mellitus with unspecified complications: Secondary | ICD-10-CM | POA: Diagnosis not present

## 2022-09-17 LAB — GLUCOSE, CAPILLARY
Glucose-Capillary: 86 mg/dL (ref 70–99)
Glucose-Capillary: 107 mg/dL — ABNORMAL HIGH (ref 70–99)
Glucose-Capillary: 127 mg/dL — ABNORMAL HIGH (ref 70–99)
Glucose-Capillary: 130 mg/dL — ABNORMAL HIGH (ref 70–99)

## 2022-09-17 LAB — BASIC METABOLIC PANEL
Anion gap: 10 (ref 5–15)
BUN: 17 mg/dL (ref 8–23)
CO2: 25 mmol/L (ref 22–32)
Calcium: 8.6 mg/dL — ABNORMAL LOW (ref 8.9–10.3)
Chloride: 105 mmol/L (ref 98–111)
Creatinine, Ser: 1.01 mg/dL (ref 0.61–1.24)
GFR, Estimated: >60 mL/min (ref >60)
Glucose, Bld: 90 mg/dL (ref 70–99)
Potassium: 3.6 mmol/L (ref 3.5–5.1)
Sodium: 140 mmol/L (ref 135–145)

## 2022-09-17 MED ORDER — MAGNESIUM OXIDE -MG SUPPLEMENT 400 (240 MG) MG PO TABS
200.0000 mg | ORAL_TABLET | Freq: Two times a day (BID) | ORAL | Status: DC
  Administered 2022-09-17 – 2022-09-20 (×6): 200 mg via ORAL
  Filled 2022-09-17 (×6): qty 1

## 2022-09-17 NOTE — Progress Notes (Signed)
Patient up in bed. Appetite good. Isolation precautions DC per Dr. Riley Kill. No temperature  cough or congestion noted. Charge nurse Misty Stanley notified.

## 2022-09-17 NOTE — Patient Care Conference (Signed)
Inpatient RehabilitationTeam Conference and Plan of Care Update Date: 09/17/2022   Time: 12:04 PM    Patient Name: James Gibbs      Medical Record Number: [id number]  Date of Birth: 1956/02/26 Sex: Male         Room/Bed: 4M12C/4M12C-01 Payor Info: Payor: [employer] / Plan: [facility] / Product Type: *No Product type* /    Admit Date/Time:  09/09/2022  3:47 PM  Primary Diagnosis:  Multiple sclerosis  Hospital Problems: Principal Problem:   Multiple sclerosis Active Problems:   Generalized weakness    Expected Discharge Date: Expected Discharge Date: 09/20/22  Team Members Present: Physician leading conference: Dr. Sula Soda Social Worker Present: Lavera Guise, BSW Nurse Present: Chana Bode, RN PT Present: Other (comment) Ambrose Finland, PT) PPS Coordinator present : Fae Pippin, SLP     Current Status/Progress Goal Weekly Team Focus  Bowel/Bladder      Incontinent of bowel and bladder (premorbid)    Incontinence managed    Timed toileting   Swallow/Nutrition/ Hydration               ADL's   set up assist bathing, (S) overall for dressing. (S) for transfers. Still lapses in safety awareness   mod I   dynamic balance, fall risk reduction, d/c planning, ADLs    Mobility   bed mobility independent, sit<>stand sup/CGA, ambulation with all devices sup/CGA with fatigue   mod I  prepare for discharge 4/27    Communication                Safety/Cognition/ Behavioral Observations               Pain     Mucinex DM  and Robitussin for cough     < 4 with prns    Assess pain q shift  Skin      N/a           Discharge Planning:  Discharging home Saturday (will be home alone and daughter able to check in). Waiting on confirmation of HH VS. OP. Patient has all DME.   Team Discussion: Patient post Rhinovirus with MS, thrombocytopenia. Doing well overall.  Patient on target to meet rehab  goals: yes, on track to meet mod I goals, currently supervision overall.  *See Care Plan and progress notes for long and short-term goals.   Revisions to Treatment Plan:  Outing with rec therapy   Teaching Needs: Safety, medications, energy conservation, transfers, toileting, etc.   Current Barriers to Discharge: Home enviroment access/layout, Incontinence, and Lack of/limited family support  Possible Resolutions to Barriers: HH follow up services     Medical Summary Current Status: improved cough. probably can dc precautions today. pain generally controlled but has spasms. improved bp and dm control  Barriers to Discharge: Medical stability   Possible Resolutions to [employer] Focus: daily assessment of labs and pt data. improved nutrition/fluid intake to help with spasms   Continued Need for Acute Rehabilitation Level of Care: The patient requires daily medical management by a physician with specialized training in physical medicine and rehabilitation for the following reasons: Direction of a multidisciplinary physical rehabilitation program to maximize functional independence : Yes Medical management of patient stability for increased activity during participation in an intensive rehabilitation regime.: Yes Analysis of laboratory values and/or radiology reports with any subsequent need for medication adjustment and/or medical intervention. : Yes   I attest that I was present, lead the team conference, and concur with  the assessment and plan of the team.   Pamelia Hoit 09/17/2022, 2:02 PM

## 2022-09-17 NOTE — Progress Notes (Signed)
PROGRESS NOTE   Subjective/Complaints: Pt up with OT. Discloses today that he's having LE spasms/myoclonus. Has had them for years but hasn't told anyone because he doesn't want to be on another med. No longer has any pulmonary symptoms.    Review of Systems  Constitutional:  Negative for fever.  HENT:  Positive for congestion.   Respiratory:  Negative for cough and sputum production.   Cardiovascular:  Negative for chest pain.  Gastrointestinal:  Negative for abdominal pain, constipation, nausea and vomiting.  Genitourinary: Negative.   Musculoskeletal:  Negative for joint pain and myalgias.  Neurological:  Positive for tremors and weakness. Negative for headaches.  Psychiatric/Behavioral:  Negative for depression.      Objective:   No results found. Recent Labs    09/15/22 0540  WBC 6.1  HGB 12.8*  HCT 38.1*  PLT 183    Recent Labs    09/15/22 0540 09/17/22 0625  NA 142 140  K 3.4* 3.6  CL 106 105  CO2 27 25  GLUCOSE 100* 90  BUN 12 17  CREATININE 1.03 1.01  CALCIUM 8.5* 8.6*     Intake/Output Summary (Last 24 hours) at 09/17/2022 0843 Last data filed at 09/17/2022 0534 Gross per 24 hour  Intake 360 ml  Output 300 ml  Net 60 ml         Physical Exam: Vital Signs Blood pressure 137/81, pulse 70, temperature 98.6 F (37 C), temperature source Oral, resp. rate 16, height  (1.803 m), weight 103.1 kg, SpO2 100 %.    Constitutional: No distress . Vital signs reviewed. HEENT: NCAT, EOMI, oral membranes moist Neck: supple Cardiovascular: RRR without murmur. No JVD    Respiratory/Chest: CTA Bilaterally without wheezes or rales. Normal effort    GI/Abdomen: BS +, non-tender, non-distended Ext: no clubbing, cyanosis, or edema Psych: pleasant and cooperative  Skin:  warm, dry and intact Neuro:     He is alert and oriented to person, place, and time.  Intact to light touch in all 4  extremities Follows commands, CN 2-12 grossly intact  Musculoskeletal:    Cervical back: Neck supple. No tenderness.     Comments: UE strength 5/5 B/L RLE HF, KE, KF, DF and PF 5-/5- just slightly weaker than L side LLE- 5/5. DTR's brisk    Assessment/Plan: 1. Functional deficits which require 3+ hours per day of interdisciplinary therapy in a comprehensive inpatient rehab setting. Physiatrist is providing close team supervision and 24 hour management of active medical problems listed below. Physiatrist and rehab team continue to assess barriers to discharge/monitor patient progress toward functional and medical goals  Care Tool:  Bathing    Body parts bathed by patient: Right arm, Left arm, Chest, Right lower leg, Left lower leg, Face, Abdomen, Front perineal area, Buttocks, Right upper leg, Left upper leg         Bathing assist Assist Level: Minimal Assistance - Patient > 75%     Upper Body Dressing/Undressing Upper body dressing   What is the patient wearing?: Pull over shirt    Upper body assist Assist Level: Supervision/Verbal cueing    Lower Body Dressing/Undressing Lower body dressing  What is the patient wearing?: Underwear/pull up, Pants     Lower body assist Assist for lower body dressing: Minimal Assistance - Patient > 75%     Toileting Toileting    Toileting assist Assist for toileting: Minimal Assistance - Patient > 75%     Transfers Chair/bed transfer  Transfers assist  Chair/bed transfer activity did not occur: Safety/medical concerns  Chair/bed transfer assist level: Minimal Assistance - Patient > 75%     Locomotion Ambulation   Ambulation assist      Assist level: Minimal Assistance - Patient > 75% Assistive device: Rollator Max distance: 150+   Walk 10 feet activity   Assist  Walk 10 feet activity did not occur: Safety/medical concerns  Assist level: Minimal Assistance - Patient > 75% Assistive device: Rollator    Walk 50 feet activity   Assist Walk 50 feet with 2 turns activity did not occur: Safety/medical concerns  Assist level: Minimal Assistance - Patient > 75% Assistive device: Rollator    Walk 150 feet activity   Assist Walk 150 feet activity did not occur: Safety/medical concerns  Assist level: Minimal Assistance - Patient > 75% Assistive device: Rollator    Walk 10 feet on uneven surface  activity   Assist Walk 10 feet on uneven surfaces activity did not occur: Safety/medical concerns         Wheelchair     Assist Is the patient using a wheelchair?: No             Wheelchair 50 feet with 2 turns activity    Assist            Wheelchair 150 feet activity     Assist          Blood pressure 137/81, pulse 70, temperature 98.6 F (37 C), temperature source Oral, resp. rate 16, height 5\' 11"  (1.803 m), weight 103.1 kg, SpO2 100 %.  Medical Problem List and Plan: 1. 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 Interdisciplinary team conference today to discuss goals, barriers to discharge, and dc planning.   -Est Discharge 4/27  2.  Antithrombotics: -DVT/anticoagulation:  Pharmaceutical: Lovenox- pt absolutely refuses any type of blood thinner, however willing to use SCDs             -antiplatelet therapy: N/A 3. Pain Management/spasms/?myoclonus:     4/24-we discussed appropriate fluid and nutritional intake to help reduce muscle spasm  -he's already on magnesium which I increased to 200mg  bid  -ultimately may need to be on medication to control.   4. Mood/Behavior/Sleep: LCSW to follow for evaluation and support.              -antipsychotic agents: N/A 5. Neuropsych/cognition: This patient appears capable of making decisions on his own behalf. 6. Skin/Wound Care: Routine pressure relief  measures.  7. Fluids/Electrolytes/Nutrition: Monitor I/O. Check CMET in am 8. MS  peudoexacerbation: Improving. Continue Dalfampridine and Dimethyl furmarate.   9.  HTN: Monitor BP TID--on proscar and amlodipine.              --continue to hold HCTZ/K dur  -4/17 restart lisinopril at 10mg    4/21 improving control.  consider increase lisinopril to 20 mg if needed  4/23 Increased lisinopril to   with improvement 4/24      09/17/2022    5:33 AM 09/16/2022   10:06 PM 09/16/2022    9:23 PM  Vitals with BMI  Systolic 137  131  Diastolic 81  84  Pulse 70 66 65    10.  T2DM: Hgb A1C- Monitor BS ac/hs--variable and question intake.  --Will continue to hold metformin.  -4/21 CBGs controlled continue to monitor   -SSI coverage as needed  -restart his home dose metformin  24hr  4/24 well controlled CBGS, continue to moniotr  CBG (last 3)  Recent Labs    09/16/22 1129 09/16/22 1638 09/17/22 0631  GLUCAP 109* 128* 86     11. H/o BPH: Continue proscar and flomax-->change flomax to evenings to avoid orthostatic changes.  -PVR 0-150  4/21-dc PVR's and scans 12. Constipation: Continue miralax bid. Continue senokot  -4/22 LBM 2 days ago, continue miralax, consider additional medication if no BM today  -4/23 reports BM yesterday  13. OSA: Continue CPAP.   14. GERD:Symptoms managed with PPI  15. Thrombocytopenia: Monitor for signs of bleeding. Recheck plts in am. -4/17 up to 155 today   -4/22 stable at 183 today   16. Droplet precautions-   -4/21 cough better today    -continue scheduled mucinex DM. Has robitussin prn   -Need to continue droplet precautions until symptoms resolved  4/34--check with IP about removing precautions. He has no sx 17. Hypokalemia  -4/17order  Kdur, recheck tomorrow  -4/18 3.5 today improved, give additional x1, recheck Monday  -4/22 Kdur today, daily starting tomorrow as K+ continues to be low  -4/24 k+ sl improved today.  Continue daily supp      LOS: 8 days A FACE TO FACE EVALUATION WAS PERFORMED  Ranelle Oyster 09/17/2022, 8:43 AM

## 2022-09-17 NOTE — Progress Notes (Signed)
Physical Therapy Session Note  Patient Details  Name: James Gibbs MRN: [id number] Date of Birth: [birth year]  Today's Date: 09/17/2022 PT Individual Time: 1330-1443 PT Individual Time Calculation (min): 73 min   Short Term Goals: Week 1:  PT Short Term Goal 1 (Week 1): STG=LTG due to ELOS  Skilled Therapeutic Interventions/Progress Updates:     Pt supine in bed upon arrival. Pt agreeable to therapy. Pt denies any pain. Pt performed supine to sit independently. Pt ambulated with upright to main gym with sup/CGA.   Pt performed Floor recovery to mat table with supervision. Discussed wearing life alert or keeping phone with pt 24/7 in case of fall. Pt verbalized understanding and reports he sleeps with his phone on them and wears his life alert at all times and has an app on his phone to unlock the door in the instance he needs help getting up. Education provided on use of AD to reduce fall risk with emphasis on using cane in room as pt previously furniture walked. Pt verbalized understanding.   Pt Amb 42 feet with cane in R UE with improved foot clearance and stability and improved sequencing of 2 point gait pattern, but R LE spasming at end, therapist encouraging pt to take seated rest break for safety, "pt reports if I take a break everytime my leg shakes, I want get anything done"  Ascended/descended 12 6 inch steps with sup/CGA for stability with step to gait ascending leading with L UE and descending leading with L LE. PT demos improved recall of sequencing, and only requiring intermittent reminders.   Pt performed ambulatory transfer to chair with SPC and CGA for stability due to R LE spasm, verbal cuing provided for sequencing as pt demos inconsistent stepping with fatigue.   Education provided on importance on recognizing body's fatigue and taking seated rest breaks as needed for fatigue to reduce fall risk. Pt verbalized and demos understanding.   Pt ambulated 2x40 feet with SPC  in R UE, with CGA/sup with improved recall of recognizing symptoms and taking seated rest breaks as needed.   1x10 L  toe taps on squishy cone, with visual/tactile cuing for light taps to prevent cone from squishing. 2x10 R toe taps on squishy cone as described with L . Pt provided R UE HHA, pt required more assist for R toe taps compared to L. Seated rest break provided between sets for fatigue.   Pt amb 2 laps around main gym with 2.5# ankle weight around R LE with upright walker and supervision, verbal cues provided for heel strike   Pt amb from main gym to room with upright and close sup with verbal cues for heel strike. Pt reports "it feels much easier now". Pt demos 1 episode of LOB due to L LE foot drag but pt able to self recover with stepping strategy.   Pt supine in bed at end of session with bed alarm on and all needs within reach.     Therapy Documentation Precautions:  Precautions Precautions: Fall Precaution Comments: chronic R sided weakness Restrictions Weight Bearing Restrictions: No    Therapy/Group: Individual Therapy  [facility] Ambrose Finland, [place], DPT  09/17/2022, 7:34 AM

## 2022-09-17 NOTE — Progress Notes (Addendum)
Patient ID: James Gibbs, male   DOB: 1956/01/03, 67 y.o.   MRN: [id number]  Covering for primary SW, Becky D.   Team Conference Report to Patient/Family  Team Conference discussion was reviewed with the patient and caregiver, including goals, any changes in plan of care and target discharge date.  Patient and caregiver express understanding and are in agreement.  The patient has a target discharge date of 09/20/22.  OP orders to be submitted to [place]. Sw spoke with daughter and daughter will be present tomorrow to attend scheduled therapy sessions. No additional questions or concerns.    James Gibbs 09/18/2022, 9:27 AM

## 2022-09-17 NOTE — Progress Notes (Signed)
Occupational Therapy Session Note  Patient Details  Name: James Gibbs MRN: [id number] Date of Birth: Aug 14, 1955   Session 1 Today's Date: 09/17/2022 OT Individual Time: 4098-1191 OT Individual Time Calculation (min): 43 min   Session 2 Today's Date: 09/17/2022 OT Individual Time: 0932-1100 OT Individual Time Calculation (min): 88 min    Short Term Goals: Week 1:  OT Short Term Goal 1 (Week 1): STG= LTG d/ ELOS  Skilled Therapeutic Interventions/Progress Updates:    Pt received supine with no c/o pain, agreeable to OT session.  Discussed today's outing at length. Provided education on indication and goals. He came to EOB with mod I. He donned pants and a new brief, completing peri hygiene in standing with crouched posture when not using AE. He asked if he could walk to the sink without cane/rollator. Extensive discussion re pt's high fall risk category, possible consequences, and benefits of using AE. He stated "I know I'm not going to win this argument". He completed 200 ft of functional mobility with the upright rollator with (S). He returned to his room and was left sitting EOB with all needs met, bed alarm set.     Session 2 Session focused on community outing to address functional mobility with varying types of AE, dynamic balance/trunk control when reaching to higher shelves, safety awareness development, and stair training to get in/out of rehab van. Education provided by OT, PT, and recreational therapist throughout session on energy conservation, fall risk reduction, community accessibility, and benefits of AE use. Pt engaged throughout and asking questions. He required min A to ascend/descend 3 stairs in/out of van- increased assist needed d/t narrow entry and need to duck head down. He often required min cueing for problem solving. He was able to complete 100 ft of functional mobility from the handicap parking spot to the front of the grocery cart and switch to a shopping cart  for UE support during in- store mobility. He required (S) and cueing for body positioning when removing an UE from the cart to reach onto shelves to load items into the cart. Myoclonus spasms in his RLE especially increasing with activity- discussed implications re fall risk, need to take fall risk and need for increasing AE use (rollator vs cane). Also practiced pt completing short distance mobility while holding two gallon water jugs. He was able to do this with close Min A however was incredibly unsafe with a large ataxic gait. Recommended strongly against this at home. Lengthy discussion re pt wanting to push himself vs what is safe from a fall risk perspective. Following the outing he returned to his room and was left supine with all needs met.   Therapy Documentation Precautions:  Precautions Precautions: Fall Precaution Comments: chronic R sided weakness Restrictions Weight Bearing Restrictions: No  Therapy/Group: Individual Therapy  Crissie Reese 09/17/2022, 7:58 AM

## 2022-09-18 DIAGNOSIS — E119 Type 2 diabetes mellitus without complications: Secondary | ICD-10-CM | POA: Diagnosis not present

## 2022-09-18 DIAGNOSIS — I10 Essential (primary) hypertension: Secondary | ICD-10-CM | POA: Diagnosis not present

## 2022-09-18 DIAGNOSIS — M62838 Other muscle spasm: Secondary | ICD-10-CM

## 2022-09-18 DIAGNOSIS — G35 Multiple sclerosis: Secondary | ICD-10-CM | POA: Diagnosis not present

## 2022-09-18 DIAGNOSIS — K5900 Constipation, unspecified: Secondary | ICD-10-CM | POA: Diagnosis not present

## 2022-09-18 LAB — GLUCOSE, CAPILLARY
Glucose-Capillary: 100 mg/dL — ABNORMAL HIGH (ref 70–99)
Glucose-Capillary: 99 mg/dL (ref 70–99)
Glucose-Capillary: 96 mg/dL (ref 70–99)
Glucose-Capillary: 115 mg/dL — ABNORMAL HIGH (ref 70–99)

## 2022-09-18 MED ORDER — BACLOFEN 5 MG HALF TABLET: 5.0000 mg | ORAL_TABLET | Freq: Three times a day (TID) | ORAL | Status: DC | PRN

## 2022-09-18 MED ORDER — SORBITOL 70 % SOLN
30.0000 mL | Freq: Once | Status: AC
  Administered 2022-09-18: 30 mL via ORAL
  Filled 2022-09-18: qty 30

## 2022-09-18 NOTE — Progress Notes (Signed)
PROGRESS NOTE   Subjective/Complaints: Lying in bed, daughter heather in room today. Reports he hasn't had a BM in couple days, feels a little constipated.     Review of Systems  Constitutional:  Negative for fever.  HENT:  Negative for congestion.   Respiratory:  Negative for cough and sputum production.   Cardiovascular:  Negative for chest pain.  Gastrointestinal:  Positive for constipation. Negative for abdominal pain, nausea and vomiting.  Genitourinary: Negative.   Musculoskeletal:  Negative for joint pain and myalgias.  Neurological:  Positive for tremors and weakness. Negative for headaches.  Psychiatric/Behavioral:  Negative for depression.      Objective:   No results found. No results for input(s): "WBC", "HGB", "HCT", "PLT" in the last 72 hours.  Recent Labs    09/17/22 0625  NA 140  K 3.6  CL 105  CO2 25  GLUCOSE 90  BUN 17  CREATININE 1.01  CALCIUM 8.6*     Intake/Output Summary (Last 24 hours) at 09/18/2022 0824 Last data filed at 09/18/2022 0640 Gross per 24 hour  Intake 594 ml  Output 1025 ml  Net -431 ml         Physical Exam: Vital Signs Blood pressure (!) 141/87, pulse 63, temperature 98.4 F (36.9 C), temperature source Oral, resp. rate 17, height  (1.803 m), weight 103.1 kg, SpO2 97 %.    Constitutional: No distress . Vital signs reviewed. HEENT: NCAT, EOMI, oral membranes moist Neck: supple Cardiovascular: RRR without murmur. No JVD    Respiratory/Chest: CTA Bilaterally without wheezes or rales. Normal effort    GI/Abdomen: BS +, non-tender, mildly-distended Ext: no clubbing, cyanosis, or edema Psych: pleasant and cooperative  Skin:  warm, dry and intact Neuro:     He is alert and oriented to person, place, and time.  Intact to light touch in all 4 extremities Follows commands, CN 2-12 grossly intact  Musculoskeletal:    Cervical back: Neck supple. No tenderness.      Comments: UE strength 5/5 B/L RLE HF, KE, KF, DF and PF 5-/5- just slightly weaker than L side LLE- 5/5. DTR's brisk    Assessment/Plan: 1. Functional deficits which require 3+ hours per day of interdisciplinary therapy in a comprehensive inpatient rehab setting. Physiatrist is providing close team supervision and 24 hour management of active medical problems listed below. Physiatrist and rehab team continue to assess barriers to discharge/monitor patient progress toward functional and medical goals  Care Tool:  Bathing    Body parts bathed by patient: Right arm, Left arm, Chest, Right lower leg, Left lower leg, Face, Abdomen, Front perineal area, Buttocks, Right upper leg, Left upper leg         Bathing assist Assist Level: Minimal Assistance - Patient > 75%     Upper Body Dressing/Undressing Upper body dressing   What is the patient wearing?: Pull over shirt    Upper body assist Assist Level: Supervision/Verbal cueing    Lower Body Dressing/Undressing Lower body dressing      What is the patient wearing?: Underwear/pull up, Pants     Lower body assist Assist for lower body dressing: Minimal Assistance - Patient > 75%  Toileting Toileting    Toileting assist Assist for toileting: Minimal Assistance - Patient > 75%     Transfers Chair/bed transfer  Transfers assist  Chair/bed transfer activity did not occur: Safety/medical concerns  Chair/bed transfer assist level: Minimal Assistance - Patient > 75%     Locomotion Ambulation   Ambulation assist      Assist level: Minimal Assistance - Patient > 75% Assistive device: Rollator Max distance: 150+   Walk 10 feet activity   Assist  Walk 10 feet activity did not occur: Safety/medical concerns  Assist level: Minimal Assistance - Patient > 75% Assistive device: Rollator   Walk 50 feet activity   Assist Walk 50 feet with 2 turns activity did not occur: Safety/medical concerns  Assist  level: Minimal Assistance - Patient > 75% Assistive device: Rollator    Walk 150 feet activity   Assist Walk 150 feet activity did not occur: Safety/medical concerns  Assist level: Minimal Assistance - Patient > 75% Assistive device: Rollator    Walk 10 feet on uneven surface  activity   Assist Walk 10 feet on uneven surfaces activity did not occur: Safety/medical concerns         Wheelchair     Assist Is the patient using a wheelchair?: No             Wheelchair 50 feet with 2 turns activity    Assist            Wheelchair 150 feet activity     Assist          Blood pressure (!) 141/87, pulse 63, temperature 98.4 F (36.9 C), temperature source Oral, resp. rate 17, height  (1.803 m), weight 103.1 kg, SpO2 97 %.  Medical Problem List and Plan: 1. 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 Interdisciplinary team conference today to discuss goals, barriers to discharge, and dc planning.   -Est Discharge 4/27  2.  Antithrombotics: -DVT/anticoagulation:  Pharmaceutical: Lovenox- pt absolutely refuses any type of blood thinner, however willing to use SCDs             -antiplatelet therapy: N/A 3. Pain Management/spasms/?myoclonus:     4/24-we discussed appropriate fluid and nutritional intake to help reduce muscle spasm  -he's already on magnesium which I increased to  bid  -ultimately may need to be on medication to control.    -Will add baclofen  TID PRN 4. Mood/Behavior/Sleep: LCSW to follow for evaluation and support.              -antipsychotic agents: N/A 5. Neuropsych/cognition: This patient appears capable of making decisions on his own behalf. 6. Skin/Wound Care: Routine pressure relief measures.  7. Fluids/Electrolytes/Nutrition: Monitor I/O. Check CMET in am 8. MS   peudoexacerbation: Improving. Continue Dalfampridine and Dimethyl furmarate.   9.  HTN: Monitor BP TID--on proscar and amlodipine.              --continue to hold HCTZ/K dur  -4/17 restart lisinopril at    4/21 improving control.  consider increase lisinopril to 20 mg if needed  4/23 Increased lisinopril to  with improvement 4/24  4/25 Improved, continue current regimen for now      09/18/2022    5:49  AM 09/17/2022    7:25 PM 09/17/2022    3:03 PM  Vitals with BMI  Systolic 141 144 147  Diastolic 87 88 86  Pulse 63 67 78    10.  T2DM: Hgb A1C- Monitor BS ac/hs--variable and question intake.  --Will continue to hold metformin.  -4/21 CBGs controlled continue to monitor   -SSI coverage as needed  -restart his home dose metformin 500mg  24hr  4/25 well controlled  CBG (last 3)  Recent Labs    09/17/22 1626 09/17/22 2111 09/18/22 0610  GLUCAP 127* 130* 100*     11. H/o BPH: Continue proscar and flomax-->change flomax to evenings to avoid orthostatic changes.  -PVR 0-150  4/21-dc PVR's and scans 12. Constipation: Continue miralax bid. Continue senokot  -4/22 LBM 2 days ago, continue miralax, consider additional medication if no BM today  -4/23 reports BM yesterday  4/25 LBM 4/23, will order sorbitol 30ml  13. OSA: Continue CPAP.   14. GERD:Symptoms managed with PPI  15. Thrombocytopenia: Monitor for signs of bleeding. Recheck plts in am. -4/17 up to 155 today   -4/22 stable at 183 today   16. Droplet precautions-   -4/21 cough better today    -continue scheduled mucinex DM. Has robitussin prn   -Need to continue droplet precautions until symptoms resolved  4/34--check with IP about removing precautions. He has no sx 17. Hypokalemia  -4/17order 40mg  Kdur, recheck tomorrow  -4/18 3.5 today improved, give additional x1, recheck Monday  -4/22 Kdur today, daily starting tomorrow as K+ continues to be low  -4/24 k+ sl improved today. Continue  daily supp      LOS: 9 days A FACE TO FACE EVALUATION WAS PERFORMED  Fanny Dance 09/18/2022, 8:24 AM

## 2022-09-18 NOTE — Progress Notes (Signed)
Patient and patients daughter had family education today. Patient nor daughter have any nursing questions or concerns at this time.

## 2022-09-18 NOTE — Progress Notes (Signed)
Physical Therapy Discharge Summary  Patient Details  Name: James Gibbs MRN: [id number] Date of Birth: Mar 13, 1956  Date of Discharge from PT service:September 19, 2022  Today's Date: 09/19/2022 PT Individual Time: 0800-0900 and [phone] PT Individual Time Calculation (min): 60 min and 44 min.   Patient has met 5 of 6 long term goals due to improved activity tolerance, improved balance, improved postural control, increased strength, increased range of motion, ability to compensate for deficits, improved attention, improved awareness, and improved coordination.  Patient to discharge at an ambulatory level Modified Independent.   Patient's care partner is independent to provide the necessary physical assistance at discharge.  Reasons goals not met: pt requires set up assist for car transfer for safety  Recommendation:  Patient will benefit from ongoing skilled PT services in outpatient setting to continue to advance safe functional mobility, address ongoing impairments in strength, balance, safety awareness, gait, and minimize fall risk.  Equipment: No equipment provided as pt has upright walker, rollator, and SPC  Reasons for discharge: treatment goals met and discharge from hospital  Patient/family agrees with progress made and goals achieved: Yes  First session:  Pt presents supine in bed and agreeable to therapy.  Pt transferred sup to sit w/ Independence and sat EOB to doff shirt and don shirt, pants and socks/shoes.  Pt transfers sit to stand w/ Mod I and amb to ink w/ rollator and mod I.  Pt sat to brush teeth.  Pt amb w/ high rollator and mod I x 200' to dayroom.  Pt requires occasional cues for posture.  Pt performed Nu-step at Level 5 x 15", final 5" emphasis on LES, esp. RLE.  Pt performed x 1121 steps and average of 74 SPM.  Pt amb to room and required use of BR, stood for continent bladder, noted in Flowsheets.  Pt returned to bed  w/ all needs in reach, bed alarm on.  Second  session:  Pt presents supine in bed and agreeable to therapy.  Pt transfers w/ mod I to high rollator.  Pt amb to small gym for car transfer to SUV height w/ set-up.  Pt negotiates ramped surface w/ mod I.  Pt amb x 200' x 2 w/ cues for posture.  Pt returned to bed w/ any questions answered.  Pt transferred sit to supine .  All needs in reach.   PT Discharge Precautions/Restrictions Precautions Precautions: Fall Precaution Comments: chronic R sided weakness Restrictions Weight Bearing Restrictions: No Pain Interference Pain Interference Pain Effect on Sleep: 1. Rarely or not at all Pain Interference with Therapy Activities: 2. Occasionally Pain Interference with Day-to-Day Activities: 2. Occasionally Vision/Perception  Vision - History Ability to See in Adequate Light: 0 Adequate Perception Perception: Within Functional Limits Praxis Praxis: Intact  Cognition Overall Cognitive Status: Within Functional Limits for tasks assessed Arousal/Alertness: Awake/alert Orientation Level: Oriented X4 Year: 2024 Month: April Day of Week: Correct Memory: Appears intact Awareness: Appears intact Problem Solving: Appears intact Safety/Judgment: Appears intact Sensation Sensation Light Touch: Appears Intact Hot/Cold: Not tested Proprioception: Impaired by gross assessment Stereognosis: Not tested Coordination Gross Motor Movements are Fluid and Coordinated: No Fine Motor Movements are Fluid and Coordinated: Yes Coordination and Movement Description: ataxic with hunched over posture-baseline MS Motor  Motor Motor: Abnormal postural alignment and control;Ataxia  Mobility Bed Mobility Bed Mobility: Supine to Sit;Sit to Supine;Rolling Right;Rolling Left Rolling Right: Independent Rolling Left: Independent Supine to Sit: Independent Sit to Supine: Independent Transfers Transfers: Sit to Stand;Stand to [employer]  Transfers Sit to Stand: Independent with assistive device (with  upright walker/rollator) Stand to Sit: Independent with assistive device (with upright walker/rollator) Stand Pivot Transfers: Independent with assistive device Transfer (Assistive device): Rolling walker Locomotion  Gait Ambulation: Yes Gait Assistance: Independent with assistive device Gait Distance (Feet): 150 Feet Assistive device: Rollator Pick up small object from the floor assist level: Independent with assistive device Pick up small object from the floor assistive device: Rollator  Trunk/Postural Assessment  Cervical Assessment Cervical Assessment: Exceptions to [facility] (forward head) Thoracic Assessment Thoracic Assessment: Exceptions to [facility] (rounded shoulders and kyphosis) Lumbar Assessment Lumbar Assessment: Exceptions to [facility] (lumbar flexion with ambulation without a device) Postural Control Postural Control: Deficits on evaluation Righting Reactions: limited  Balance Balance Balance Assessed: Yes Static Sitting Balance Static Sitting - Balance Support: Feet supported Static Sitting - Level of Assistance: 7: Independent Dynamic Sitting Balance Dynamic Sitting - Balance Support: Feet supported Dynamic Sitting - Level of Assistance: 6: Modified independent (Device/Increase time) Static Standing Balance Static Standing - Balance Support: During functional activity Static Standing - Level of Assistance: 6: Modified independent (Device/Increase time) Dynamic Standing Balance Dynamic Standing - Balance Support: During functional activity Dynamic Standing - Level of Assistance: 6: Modified independent (Device/Increase time) Dynamic Standing - Comments: gait with rollator/upwalker Extremity Assessment  RLE Assessment RLE Assessment: Exceptions to [facility] General Strength Comments: grossly 4+/5 LLE Assessment LLE Assessment: Within Functional Limits   Lucio Edward, PT   [facility], [place], DPT  09/18/2022, 3:59 PM

## 2022-09-18 NOTE — Progress Notes (Signed)
Occupational Therapy Session Note  Patient Details  Name: James Gibbs MRN: [id number] Date of Birth: [birth year]  Today's Date: 09/18/2022 OT Individual Time: 4098-1191 OT Individual Time Calculation (min): 70 min    Short Term Goals: Week 1:  OT Short Term Goal 1 (Week 1): STG= LTG d/ ELOS  Skilled Therapeutic Interventions/Progress Updates:    Pt greeted semi-reclined in bed and agreeable to OT treatment session. Daughter present for family education. Pt reported need to go to the bathroom. Functional ambulation to bathroom w/ Rollator and supervision. Pt needed cues for safety with rollator and proximity to commode. Pt passed gas but no BM per pt report. Cues for rollator positioning for safety at the sink to wash hands. Pt then ambulated to therapy apartment and sat on couch. Daughter assisted with set-up for walk-In shower transfer in simulated home environment. Education provided regarding safe BADL performance in home environment, home modifications, DME needs, energy conservation techniques, and safety awareness.  Asked daughter to plan to be present for showers to provide supervision for safety with both pt and daughter verbalizing understanding. Addressed UB there-ex, standing balance/endurance using 3 lb dowel rod. 3 sests of 10 chest press, bicep curl, and straight arm raise in standing. Facilitation for anterior weight shift and increased hip and trunk extension. Addressed anterior pelvic tilt using NDT techniques with pt able to get pelvis into neutral, then worked on maintaining that pelvic position when standing. [employer] overhead reach while listening to pt preferred praise and worship music. Utilized Squiggz placed high up on window to achieve more trunkadn hip extension with reach. Pt ambulated back to room and was left semi-reclined in bed with needs met.    Therapy Documentation Precautions:  Precautions Precautions: Fall Precaution Comments: chronic R sided  weakness Restrictions Weight Bearing Restrictions: No Pain:   Therapy/Group: Individual Therapy  Mal Amabile 09/18/2022, 4:17 PM

## 2022-09-18 NOTE — Plan of Care (Signed)
Patient with bladder incontinence PTA; using briefs with medications /MS; able to manage incontinence within the brief and did not soil linen

## 2022-09-18 NOTE — Progress Notes (Signed)
Patient ID: James Gibbs, male   DOB: [birth year], 67 y.o.   MRN: [id number]  [facility] orders [facility] to [facility] to [email or web address]

## 2022-09-18 NOTE — Progress Notes (Signed)
Physical Therapy Session Note  Patient Details  Name: James Gibbs MRN: [id number] Date of Birth: February 18, 1956  Today's Date: 09/18/2022 PT Individual Time: 4098-1191, [id number] PT Individual Time Calculation (min): 45 min, 73 min  Short Term Goals: Week 1:  PT Short Term Goal 1 (Week 1): STG=LTG due to ELOS  Skilled Therapeutic Interventions/Progress Updates:      Treatment Session 1   Pt supine in bed upon arrival. Pt agreeable to therapy. Pt denies any pain.   Pt requesting to use bathroom. Pt ambulated from bed to bathroom with rollator mod I, positioned rollator to L of toilet and locked brakes with verbal cues for safety.   Pt continent of bladder in standing, pt donned/doffed pants in standing with mod I.   Pt ambulated from toilet to sink with mod I. Pt positioned rollator to L of sink and locked brakes with verbal cues for safety. Pt washed hands indepndently in standing.   Pt ambulated with rollator and mod I room to gym.   Pt ascended/descended 12 steps with L UE on SPC and R UE on handrail, with step to gait leading with L LE with ascending and R LE with descending with mod I.   Daughter arrived to family training during stair navigation. Pt ascended 4 6 inch steps with same method as described above. Pt performed car transfer with mod I with Rollator.   Discussed with daughter pt is mod I with rollator/up walker for sit<>stand, stand pivot, indoor ambulation, picking up an object off of the floor, getting onto/off of the lift chair with rollator/upwalker. Pt demos all components for pt and daughter agrees. Emphasized pt needs to be using a rollator/upwalker at all times due to fall risk, pt daughter reports he will be able to fit rollator in all rooms in house including bedroom and bathroom. Emphasized pt needing to take seated rest breaks with fatigue, and importance of recognizing when the body is telling him he is fatigued. Emphasized keeping life alert on pt at all  times.   Pt reports pt will not be driving initally upon discharge and will have assistance to get to grocery store, church, appointments, etc.   Pt performed ambulatory transfer to bed, and performed bed mobility independently.   Pt supine in bed with daughter in room and all needs within reach at end of session.   Treatment Session 2   Pt seated in WC upon arrival. Pt agreeable to therapy. Pt denies any pain.   Pt ambulated from room to ortho gym with upwalker with mod I. Pt lifted upwalker into car with CGA due to posterior LOB. Educated pt to not do this alone at home, as pt unbalanced and will have assistance with transportation. Pt verbalized understanding.   Pt reviewed and performed HEP, with therapist providing verbal and tactile cues to reduce compensation and improve posture.    Access Code: [email or web address] URL: [email or web address] Date: 09/18/2022 Prepared by: Ambrose Finland  Exercises - Supine Bridge  - 1 x daily - 7 x weekly - 3 sets - 10 reps - Sidelying Hip Abduction  - 1 x daily - 7 x weekly - 3 sets - 10 reps - Supine Active Straight Leg Raise  - 1 x daily - 7 x weekly - 3 sets - 10 reps - Supine Bridge  - 1 x daily - 7 x weekly - 3 sets - 10 reps - Prone Hip Extension  - 1 x daily - 7 x weekly - 3 sets -  10 reps - Sit to Stand  - 1 x daily - 7 x weekly - 3 sets - 10 reps - Standing Hip Abduction with Counter Support  - 1 x daily - 7 x weekly - 3 sets - 10 reps - Standing Hip Extension with Counter Support  - 1 x daily - 7 x weekly - 3 sets - 10 reps - Heel Raises with Counter Support  - 1 x daily - 7 x weekly - 3 sets - 10 reps - Standing March with Counter Support  - 1 x daily - 7 x weekly - 3 sets - 10 reps  Pt requesting to use bathroom, pt performed ambulatory transfer to bathroom with up walker. Pt continent of bowel and bladder (documented in flowsheets). Pt donned/doffed pants and performed pericare mod I with B UE on grabbars.   Therapy  Documentation Precautions:  Precautions Precautions: Fall Precaution Comments: chronic R sided weakness Restrictions Weight Bearing Restrictions: No  Therapy/Group: Individual Therapy  [facility] Ambrose Finland, [place], DPT  09/18/2022, 7:39 AM

## 2022-09-19 DIAGNOSIS — K5900 Constipation, unspecified: Secondary | ICD-10-CM | POA: Diagnosis not present

## 2022-09-19 DIAGNOSIS — E119 Type 2 diabetes mellitus without complications: Secondary | ICD-10-CM | POA: Diagnosis not present

## 2022-09-19 DIAGNOSIS — I10 Essential (primary) hypertension: Secondary | ICD-10-CM | POA: Diagnosis not present

## 2022-09-19 DIAGNOSIS — G35 Multiple sclerosis: Secondary | ICD-10-CM | POA: Diagnosis not present

## 2022-09-19 LAB — BASIC METABOLIC PANEL
Anion gap: 11 (ref 5–15)
BUN: 13 mg/dL (ref 8–23)
CO2: 26 mmol/L (ref 22–32)
Calcium: 9.3 mg/dL (ref 8.9–10.3)
Chloride: 102 mmol/L (ref 98–111)
Creatinine, Ser: 1.07 mg/dL (ref 0.61–1.24)
GFR, Estimated: >60 mL/min (ref >60)
Glucose, Bld: 97 mg/dL (ref 70–99)
Potassium: 3.7 mmol/L (ref 3.5–5.1)
Sodium: 139 mmol/L (ref 135–145)

## 2022-09-19 LAB — GLUCOSE, CAPILLARY
Glucose-Capillary: 96 mg/dL (ref 70–99)
Glucose-Capillary: 107 mg/dL — ABNORMAL HIGH (ref 70–99)
Glucose-Capillary: 111 mg/dL — ABNORMAL HIGH (ref 70–99)
Glucose-Capillary: 148 mg/dL — ABNORMAL HIGH (ref 70–99)

## 2022-09-19 MED ORDER — ZINC SULFATE 220 (50 ZN) MG PO CAPS: 220.0000 mg | ORAL_CAPSULE | Freq: Every day | ORAL | 0 refills | Status: DC

## 2022-09-19 MED ORDER — POTASSIUM CHLORIDE CRYS ER 10 MEQ PO TBCR: 10.0000 meq | EXTENDED_RELEASE_TABLET | Freq: Every day | ORAL | 0 refills | Status: DC

## 2022-09-19 MED ORDER — SENNOSIDES-DOCUSATE SODIUM 8.6-50 MG PO TABS: 1.0000 | ORAL_TABLET | Freq: Every day | ORAL | Status: DC

## 2022-09-19 MED ORDER — BACLOFEN 5 MG PO TABS: 5.0000 mg | ORAL_TABLET | Freq: Three times a day (TID) | ORAL | 0 refills | Status: DC | PRN

## 2022-09-19 MED ORDER — CALCIUM CARBONATE ANTACID 500 MG PO CHEW: 1.0000 | CHEWABLE_TABLET | ORAL | 0 refills | Status: DC | PRN

## 2022-09-19 MED ORDER — MELATONIN 3 MG PO TABS: 3.0000 mg | ORAL_TABLET | Freq: Every evening | ORAL | 0 refills | Status: DC | PRN

## 2022-09-19 MED ORDER — POLYETHYLENE GLYCOL 3350 17 G PO PACK: 17.0000 g | PACK | Freq: Two times a day (BID) | ORAL | 0 refills | Status: AC

## 2022-09-19 MED ORDER — DM-GUAIFENESIN ER 30-600 MG PO TB12: 1.0000 | ORAL_TABLET | Freq: Two times a day (BID) | ORAL | 0 refills | Status: DC

## 2022-09-19 MED ORDER — LISINOPRIL 20 MG PO TABS
40.0000 mg | ORAL_TABLET | Freq: Every day | ORAL | Status: DC
  Administered 2022-09-20: 40 mg via ORAL
  Filled 2022-09-19: qty 2

## 2022-09-19 MED ORDER — MAGNESIUM OXIDE -MG SUPPLEMENT 400 (240 MG) MG PO TABS: 200.0000 mg | ORAL_TABLET | Freq: Two times a day (BID) | ORAL | 0 refills | Status: DC

## 2022-09-19 NOTE — Progress Notes (Addendum)
PROGRESS NOTE   Subjective/Complaints: Patient working with physical therapy this morning.  Last bowel movement was yesterday.  He is a little anxious about going home tomorrow.   Review of Systems  Constitutional:  Negative for fever.  HENT:  Negative for congestion.   Respiratory:  Negative for cough and sputum production.   Cardiovascular:  Negative for chest pain.  Gastrointestinal:  Positive for constipation. Negative for abdominal pain, nausea and vomiting.  Genitourinary: Negative.   Musculoskeletal:  Negative for joint pain and myalgias.  Neurological:  Positive for tremors and weakness. Negative for headaches.  Psychiatric/Behavioral:  Negative for depression. The patient is nervous/anxious.      Objective:   No results found. No results for input(s): "WBC", "HGB", "HCT", "PLT" in the last 72 hours.  Recent Labs    09/17/22 0625  NA 140  K 3.6  CL 105  CO2 25  GLUCOSE 90  BUN 17  CREATININE 1.01  CALCIUM 8.6*     Intake/Output Summary (Last 24 hours) at 09/19/2022 0827 Last data filed at 09/19/2022 0743 Gross per 24 hour  Intake 600 ml  Output 825 ml  Net -225 ml         Physical Exam: Vital Signs Blood pressure (!) 140/85, pulse 70, temperature 97.7 F (36.5 C), resp. rate 18, height 5\' 11"  (1.803 m), weight 103.1 kg, SpO2 100 %.    Constitutional: No distress . Vital signs reviewed. HEENT: NCAT, EOMI, oral membranes moist Neck: supple Cardiovascular: RRR without murmur. No JVD    Respiratory/Chest: CTA Bilaterally without wheezes or rales. Normal effort    GI/Abdomen: BS +, non-tender, nondistended Ext: no clubbing, cyanosis, or edema Psych: pleasant and cooperative  Skin:  warm, dry and intact Neuro:     He is alert and oriented to person, place, and time.  Intact to light touch in all 4 extremities Follows commands, CN 2-12 grossly intact  Musculoskeletal:    Cervical back: Neck  supple. No tenderness.     Comments: UE strength 5/5 B/L RLE HF, KE, KF, DF and PF 5-/5- just slightly weaker than L side LLE- 5/5. DTR's brisk    Assessment/Plan: 1. Functional deficits which require 3+ hours per day of interdisciplinary therapy in a comprehensive inpatient rehab setting. Physiatrist is providing close team supervision and 24 hour management of active medical problems listed below. Physiatrist and rehab team continue to assess barriers to discharge/monitor patient progress toward functional and medical goals  Care Tool:  Bathing    Body parts bathed by patient: Right arm, Left arm, Chest, Right lower leg, Left lower leg, Face, Abdomen, Front perineal area, Buttocks, Right upper leg, Left upper leg         Bathing assist Assist Level: Minimal Assistance - Patient > 75%     Upper Body Dressing/Undressing Upper body dressing   What is the patient wearing?: Pull over shirt    Upper body assist Assist Level: Supervision/Verbal cueing    Lower Body Dressing/Undressing Lower body dressing      What is the patient wearing?: Underwear/pull up, Pants     Lower body assist Assist for lower body dressing: Minimal Assistance - Patient >  75%     Toileting Toileting    Toileting assist Assist for toileting: Minimal Assistance - Patient > 75%     Transfers Chair/bed transfer  Transfers assist  Chair/bed transfer activity did not occur: Safety/medical concerns  Chair/bed transfer assist level: Independent with assistive device Chair/bed transfer assistive device: Walker (rollator/upwalker)   Locomotion Ambulation   Ambulation assist      Assist level: Independent with assistive device Assistive device: Rollator Max distance: 150+   Walk 10 feet activity   Assist  Walk 10 feet activity did not occur: Safety/medical concerns  Assist level: Independent with assistive device Assistive device: Rollator   Walk 50 feet activity   Assist Walk 50  feet with 2 turns activity did not occur: Safety/medical concerns  Assist level: Independent with assistive device Assistive device: Rollator    Walk 150 feet activity   Assist Walk 150 feet activity did not occur: Safety/medical concerns  Assist level: Independent with assistive device Assistive device: Rollator    Walk 10 feet on uneven surface  activity   Assist Walk 10 feet on uneven surfaces activity did not occur: Safety/medical concerns   Assist level: Independent with assistive device Assistive device: Rollator   Wheelchair     Assist Is the patient using a wheelchair?: No             Wheelchair 50 feet with 2 turns activity    Assist            Wheelchair 150 feet activity     Assist          Blood pressure (!) 140/85, pulse 70, temperature 97.7 F (36.5 C), resp. rate 18, height 5\' 11"  (1.803 m), weight 103.1 kg, SpO2 100 %.  Medical Problem List and Plan: 1. 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 Interdisciplinary team conference today to discuss goals, barriers to discharge, and dc planning.   -Est Discharge 4/27 tomorrow  2.  Antithrombotics: -DVT/anticoagulation:  Pharmaceutical: Lovenox- pt absolutely refuses any type of blood thinner, however willing to use SCDs             -antiplatelet therapy: N/A 3. Pain Management/spasms/?myoclonus:     4/24-we discussed appropriate fluid and nutritional intake to help reduce muscle spasm  -he's already on magnesium which I increased to 200mg  bid  -ultimately may need to be on medication to control.    -Will add baclofen 5mg  TID PRN 4. Mood/Behavior/Sleep: LCSW to follow for evaluation and support.              -antipsychotic agents: N/A 5. Neuropsych/cognition: This patient appears capable of making decisions on his own  behalf. 6. Skin/Wound Care: Routine pressure relief measures.  7. Fluids/Electrolytes/Nutrition: Monitor I/O. Check CMET in am 8. MS  peudoexacerbation: Improving. Continue Dalfampridine and Dimethyl furmarate.   9.  HTN: Monitor BP TID--on proscar and amlodipine.              --continue to hold HCTZ/K dur  -4/17 restart lisinopril at 10mg    4/21 improving control.  consider increase lisinopril to 20 mg if needed  4/23 Increased lisinopril to 20mg  with improvement 4/24  4/25 BP a little bit above goal, increase lisinopril to 40mg   09/19/2022    6:15 AM 09/18/2022    8:11 PM 09/18/2022    4:47 PM  Vitals with BMI  Systolic 140 138 409  Diastolic 85 97 94  Pulse 70 65 69    10.  T2DM: Hgb A1C- Monitor BS ac/hs--variable and question intake.  --Will continue to hold metformin.  -4/21 CBGs controlled continue to monitor   -SSI coverage as needed  -restart his home dose metformin 500mg  24hr  4/26 well-controlled, continue current regimen  CBG (last 3)  Recent Labs    09/18/22 1645 09/18/22 2113 09/19/22 0617  GLUCAP 96 115* 96     11. H/o BPH: Continue proscar and flomax-->change flomax to evenings to avoid orthostatic changes.  -PVR 0-150  4/21-dc PVR's and scans 12. Constipation: Continue miralax bid. Continue senokot  -4/22 LBM 2 days ago, continue miralax, consider additional medication if no BM today  -4/23 reports BM yesterday  4/25 LBM 4/23, will order sorbitol 30ml  4/26 improved with bowel movement yesterday, patient tried suppositories in the past reports he had GI bleeding after using this and does not want to use this again.  He uses MiraLAX at home resulting in daily bowel movements.  He does not want to change his home regimen at this time.  13. OSA: Continue CPAP.   14. GERD:Symptoms managed with PPI  15. Thrombocytopenia: Monitor for signs of bleeding. Recheck plts in am. -4/17 up to 155 today   -4/22 stable at 183 today   16. Droplet precautions-    -4/21 cough better today    -continue scheduled mucinex DM. Has robitussin prn   -Need to continue droplet precautions until symptoms resolved  4/34--check with IP about removing precautions. He has no sx 17. Hypokalemia  -4/17order 40mg  Kdur, recheck tomorrow  -4/18 3.5 today improved, give additional x1, recheck Monday  -4/22 Kdur today, daily starting tomorrow as K+ continues to be low  -4/24 k+ sl improved today. Continue daily supp  -4/26 recheck today since DC tomorrow      LOS: 10 days A FACE TO FACE EVALUATION WAS PERFORMED  Fanny Dance 09/19/2022, 8:27 AM

## 2022-09-19 NOTE — Progress Notes (Signed)
Inpatient Rehabilitation Care Coordinator Discharge Note   Patient Details  Name: James Gibbs MRN: [id number] Date of Birth: 27-Jan-1956   Discharge location: Home  Length of Stay: 11 Days  Discharge activity level: sup/ MOD I  Home/community participation: Daughter, Research scientist (physical sciences)  Patient response YQ:MVHQIO Literacy - How often do you need to have someone help you when you read instructions, pamphlets, or other written material from your doctor or pharmacy?: Never  Patient response NG:EXBMWU Isolation - How often do you feel lonely or isolated from those around you?: Never  Services provided included: MD, PT, RD, SLP, OT, RN, CM, TR, Pharmacy, Neuropsych, SW  Financial Services:  Field seismologist Utilized: [employer]  Choices offered to/list presented to: Patient and Daughter  Follow-up services arranged:  Outpatient    [facility]      Patient response to transportation need: Is the patient able to respond to transportation needs?: Yes In the past 12 months, has lack of transportation kept you from medical appointments or from getting medications?: No In the past 12 months, has lack of transportation kept you from meetings, work, or from getting things needed for daily living?: No   Patient/Family verbalized understanding of follow-up arrangements:  Yes  Individual responsible for coordination of the follow-up plan: self or daughter  Confirmed correct DME delivered: Andria Rhein 09/19/2022    Comments (or additional information):  Summary of Stay    Date/Time Discharge Planning CSW  09/16/22 1208 Discharging home Saturday (will be home alone and daughter able to check in). Waiting on confirmation of HH VS. OP. Patient has all DME. CJB  09/10/22 0841 New evaluation according to the chart will need to be mod/i home alone will have intermittent assist only. RGD       Andria Rhein

## 2022-09-19 NOTE — Progress Notes (Signed)
Occupational Therapy Discharge Summary  Patient Details  Name: James Gibbs MRN: [id number] Date of Birth: 02-Sep-1955  Date of Discharge from OT service:September 19, 2022  Session 1 Today's Date: 09/19/2022 OT Individual Time: 1015-1100 OT Individual Time Calculation (min): 45 min   Session 2 Today's Date: 09/19/2022 OT Individual Time: 1410-1507 OT Individual Time Calculation (min): 57 min    Patient has met 5 of 5 long term goals due to improved activity tolerance, improved balance, postural control, ability to compensate for deficits, and improved coordination.  Patient to discharge at overall Modified Independent level.  Patient's care partner is independent to provide the necessary physical assistance at discharge with IADLs. Family education has been completed with his daughter. Pt will go home with intermittent supervision from friends and family.   Recommendation:  Patient will benefit from ongoing skilled OT services in home health setting to continue to advance functional skills in the area of BADL and iADL.  Equipment: No equipment provided  Reasons for discharge: treatment goals met and discharge from hospital  Patient/family agrees with progress made and goals achieved: Yes  Skilled OT Intervention Session began with pt supine, no c/o pain, extensive discussion re psychosocial adjustment and coping strategies as pt expressed anxiety re tomorrow's discharge. He reported feelings of loneliness. Discussed IADl engagement and ways to safely attend social events that he enjoys, such as church. Reviewed OT POC and the progress that he has made since admission to CIR. He donned shoes and completed functional mobility to the therapy gym with mod I using rollator. He completed functional activity focused on endurance and dynamic balance on the stairs with single leg step up and dynamic leg raise reciprocally. He required seated rest breaks between trials. He was left supine with  all needs met. Pt made mod I in the room with clear instructions to not shower with set up assist from nursing and to only transfer to toilet d/t lots of equipment in the room making it crowded.    Session 2 Pt received supine with no c/o pain, agreeable to OT session. He used the upright rollator to the therapy gym with mod I. He completed standing level dynamic throwing/catching task to challenge dynamic standing balance and functional activity tolerance. Carryover to higher level IADLs.  3x15 repetitions. He then completed dynamic "wood chops" lifting 2 kg medicine ball dynamically during squat to stand to challenge full body coordination and dynamic balance. Difficulty maintaining full stand and challenging on cardiorespiratory endurance. He ended with 50 ft of functional mobility holding dynamic water weight "tidal tank" (5 lbs) to challenge dynamic righting reactions. He was left supine with all needs met.    OT Discharge Precautions/Restrictions  Precautions Precautions: Fall Precaution Comments: chronic R sided weakness Restrictions Weight Bearing Restrictions: No   ADL ADL Eating: Independent Where Assessed-Eating: Chair Grooming: Modified independent Where Assessed-Grooming: Sitting at sink Upper Body Bathing: Modified independent Where Assessed-Upper Body Bathing: Shower Lower Body Bathing: Modified independent Where Assessed-Lower Body Bathing: Shower Upper Body Dressing: Modified independent (Device) Where Assessed-Upper Body Dressing: Sitting at sink Lower Body Dressing: Modified independent Where Assessed-Lower Body Dressing: Sitting at sink Toileting: Modified independent Where Assessed-Toileting: Teacher, adult education: Engineer, agricultural Method: Proofreader: Gaffer: Information systems manager with back Film/video editor: Modified independent Film/video editor Method: Materials engineer: Information systems manager with back Vision Baseline Vision/History: 1 Wears glasses Patient Visual Report: No change from baseline Vision Assessment?: No apparent  visual deficits Perception  Perception: Within Functional Limits Praxis Praxis: Intact Cognition Cognition Overall Cognitive Status: Within Functional Limits for tasks assessed Arousal/Alertness: Awake/alert Memory: Appears intact Awareness: Appears intact Problem Solving: Appears intact Safety/Judgment: Appears intact Comments: Overestimation of abilities at home and maladaptive patterns of movment Brief Interview for Mental Status (BIMS) Repetition of Three Words (First Attempt): 3 Temporal Orientation: Year: Correct Temporal Orientation: Month: Accurate within 5 days Temporal Orientation: Day: Correct Recall: "Sock": Yes, no cue required Recall: "Blue": Yes, no cue required Recall: "Bed": Yes, no cue required BIMS Summary Score: 15 Sensation Sensation Light Touch: Appears Intact Hot/Cold: Appears Intact Coordination Gross Motor Movements are Fluid and Coordinated: No Fine Motor Movements are Fluid and Coordinated: Yes Coordination and Movement Description: ataxic with hunched over posture-baseline MS Motor  Motor Motor: Abnormal postural alignment and control;Ataxia Mobility  Bed Mobility Bed Mobility: Supine to Sit;Sit to Supine;Rolling Right;Rolling Left Rolling Right: Independent Rolling Left: Independent Supine to Sit: Independent Sit to Supine: Independent Transfers Sit to Stand: Independent with assistive device Stand to Sit: Independent with assistive device  Trunk/Postural Assessment  Cervical Assessment Cervical Assessment: Within Functional Limits Thoracic Assessment Thoracic Assessment: Exceptions to [facility] (rounded shoulders) Lumbar Assessment Lumbar Assessment: Exceptions to [facility] (posterior pelvic tilt) Postural Control Postural Control: Deficits on evaluation Righting Reactions: limited   Balance Balance Balance Assessed: Yes Static Sitting Balance Static Sitting - Balance Support: Feet supported Static Sitting - Level of Assistance: 7: Independent Dynamic Sitting Balance Dynamic Sitting - Balance Support: Feet supported Dynamic Sitting - Level of Assistance: 7: Independent Static Standing Balance Static Standing - Balance Support: During functional activity;Bilateral upper extremity supported Static Standing - Level of Assistance: 6: Modified independent (Device/Increase time) Dynamic Standing Balance Dynamic Standing - Balance Support: During functional activity Dynamic Standing - Level of Assistance: 6: Modified independent (Device/Increase time) Extremity/Trunk Assessment RUE Assessment RUE Assessment: Within Functional Limits General Strength Comments: reports baseline weakness but is fully functional LUE Assessment LUE Assessment: Within Functional Limits   Crissie Reese 09/19/2022, 10:54 AM

## 2022-09-19 NOTE — Discharge Summary (Signed)
Physician Discharge Summary  Patient ID: JSOEPH PODESTA MRN: [id number] DOB/AGE: [id number] 69 y.o.  Admit date: 09/09/2022 Discharge date: 09/20/2022  Discharge Diagnoses:  Principal Problem:   Multiple sclerosis (HCC) Active Problems:   HTN (hypertension)   Generalized weakness   Hypokalemia   Constipation   Diabetes (HCC)   Rhinovirus infection   Discharged Condition: stable  Significant Diagnostic Studies: N/A   Labs:  Basic Metabolic Panel:    Latest Ref Rng & Units 09/19/2022   11:17 AM 09/17/2022    6:25 AM 09/15/2022    5:40 AM  BMP  Glucose 70 - 99 mg/dL 97  90  782   BUN 8 - 23 mg/dL 13  17  12    Creatinine 0.61 - 1.24 mg/dL 9.56  2.13  0.86   Sodium 135 - 145 mmol/L 139  140  142   Potassium 3.5 - 5.1 mmol/L 3.7  3.6  3.4   Chloride 98 - 111 mmol/L 102  105  106   CO2 22 - 32 mmol/L 26  25  27    Calcium 8.9 - 10.3 mg/dL 9.3  8.6  8.5      CBC:    Latest Ref Rng & Units 09/15/2022    5:40 AM 09/10/2022    5:28 AM 09/05/2022    5:21 AM  CBC  WBC 4.0 - 10.5 K/uL 6.1  5.1  4.9   Hemoglobin 13.0 - 17.0 g/dL 57.8  46.9  62.9   Hematocrit 39.0 - 52.0 % 38.1  35.8  42.0   Platelets 150 - 400 K/uL 183  155  137       CBG: Recent Labs  Lab 09/19/22 0617 09/19/22 1154 09/19/22 1655 09/19/22 2106 09/20/22 0632  GLUCAP 96 107* 111* 148* 95    Brief HPI:   JESS TONEY is a 67 y.o. male with history of MS, HTN, T2DM, MS with right sided weakness who was admitted on 09/04/22 with cough, inability to  move LLE and inability to get out of bed or walk. He was started on IVF and antibiotic initially. He was found to have rhinovirus with pseudoexacerbation of MS. CXR and BC negative therefore antibiotics discontinued. Bowel program augmented to treat constipation. PT/OT was consulted and patient was noted to be limited by fatigue, weakness with flexed posture as well as cognitive deficits. CIR was recommended due to functional decline.    Hospital Course:  MARCELLUS PULLIAM was admitted to rehab 09/09/2022 for inpatient therapies to consist of PT  and OT at least three hours five days a week. Past admission physiatrist, therapy team and rehab RN have worked together to provide customized collaborative inpatient rehab. His blood pressures were monitored on TID basis and has been controlled off HCTZ. Hypokalemia has resolved with resumption of Kdur at lower dose. His diabetes has been monitored with ac/hs CBG checks and SSI was use prn for tighter BS control.    Baclofen added prn to help manage episodic myoclonus. His intake has been good and BS have been controlled. Constipation has resolved and he is voiding without difficulty. Respiratory status has been stable and he was kept on respiratory precautions due to ongoing cough. Mucinex DM added with improvement in his symptoms and he was advised to taper this off after discharge.  [facility] for follow up PT and OT after discharge.    Physical Exam: HEENT: AT, [place], EOMI, Cardiovascular: RRR, no murmurs Abdomen: SNT, + BS Extremities: No  edema. RLE slightly weaker than LLE Neuro: Speech soft but clear. Able to follow commands without difficulty. Sensation intact to light touch in extremities: Skin: Warm and dry.  Psych: Affect normal and interacts appropriately.    Rehab course: During patient's stay in rehab weekly team conferences were held to monitor patient's progress, set goals and discuss barriers to discharge. At admission, patient required min assist with mobility and with ADL tasks. He  has had improvement in activity tolerance, balance, postural control as well as ability to compensate for deficits. He is able to complete ADL tasks at modified independent level. He is independent for transfers and to ambulate 150 feet with upright rollater.   Disposition: Home  Diet: Carb modified.  Special Instructions: Recommend repeat BMET in 1-2 weeks to monitor potassium  levels No driving or strenuous activity till cleared by MD.    Allergies as of 09/20/2022   No Known Allergies      Medication List     STOP taking these medications    lidocaine 5 % Commonly known as: LIDODERM   MICROZIDE PO       TAKE these medications    acetaminophen 650 MG CR tablet Commonly known as: TYLENOL Take 650 mg by mouth every 8 (eight) hours as needed for pain.   allopurinol 300 MG tablet Commonly known as: ZYLOPRIM Take 300 mg by mouth daily.   amLODipine 10 MG tablet Commonly known as: NORVASC Take 10 mg by mouth daily.   atorvastatin 80 MG tablet Commonly known as: LIPITOR Take 80 mg by mouth at bedtime.   Baclofen 5 MG Tabs Take 1 tablet (5 mg total) by mouth 3 (three) times daily as needed for muscle spasms.   calcium carbonate 500 MG chewable tablet Commonly known as: TUMS - dosed in mg elemental calcium Chew 1 tablet (200 mg of elemental calcium total) by mouth every 4 (four) hours as needed for indigestion or heartburn.   cholecalciferol 10 MCG (400 UNIT) Tabs tablet Commonly known as: VITAMIN D3 Take 400 Units by mouth daily.   dalfampridine 10 MG Tb12 Take 1 tablet by mouth every 12 (twelve) hours.   dextromethorphan-guaiFENesin 30-600 MG 12hr tablet Commonly known as: MUCINEX DM Take 1 tablet by mouth 2 (two) times daily.   finasteride 5 MG tablet Commonly known as: PROSCAR Take 5 mg by mouth daily.   Fish Oil 1000 MG Caps Take 1,000 mg by mouth daily.   lisinopril 40 MG tablet Commonly known as: ZESTRIL Take 40 mg by mouth daily.   magnesium oxide 400 (240 Mg) MG tablet Commonly known as: MAG-OX Take 0.5 tablets (200 mg total) by mouth 2 (two) times daily.   melatonin 3 MG Tabs tablet Take 1 tablet (3 mg total) by mouth at bedtime as needed (insomnia).   metFORMIN 500 MG 24 hr tablet Commonly known as: GLUCOPHAGE-XR Take 500 mg by mouth daily.   mirtazapine 15 MG tablet Commonly known as: REMERON Take 15 mg by  mouth at bedtime. May repeat with a quarter of a tablet if you wake up - up to twice a night)   multivitamin with minerals Tabs tablet Take 1 tablet by mouth daily.   pantoprazole 40 MG tablet Commonly known as: Protonix Take 1 tablet (40 mg total) by mouth daily. What changed: when to take this   polyethylene glycol 17 g packet Commonly known as: MIRALAX / GLYCOLAX Take 17 g by mouth 2 (two) times daily. What changed: when to take this  polyvinyl alcohol 1.4 % ophthalmic solution Commonly known as: LIQUIFILM TEARS Place 1 drop into both eyes 4 (four) times daily.   potassium chloride 10 MEQ tablet Commonly known as: KLOR-CON M Take 1 tablet (10 mEq total) by mouth daily. What changed:  medication strength how much to take   senna-docusate 8.6-50 MG tablet Commonly known as: Senokot-S Take 1 tablet by mouth at bedtime. What changed: when to take this   tamsulosin 0.4 MG Caps capsule Commonly known as: FLOMAX Take 2 capsules (0.8 mg total) by mouth daily after supper. What changed: when to take this   Tecfidera 240 MG Cpdr Generic drug: Dimethyl Fumarate Take 240 mg by mouth 2 (two) times daily.   vitamin C 100 MG tablet Take 500 mg by mouth in the morning and at bedtime.   zinc sulfate 220 (50 Zn) MG capsule Take 1 capsule (220 mg total) by mouth daily after supper.        Follow-up Information     Tapp, Karen Chafe, MD Follow up.   Specialty: Internal Medicine Why: Call in 1-2 days for post hospital follow up Contact information: [address] [place] [address] [phone]         Fanny Dance, MD Follow up.   Specialty: Physical Medicine and Rehabilitation Why: office will call you with follow up appointment Contact information: [address] [place] [address] [phone]                 Signed: Jacquelynn Cree 09/22/2022, 1:52 PM

## 2022-09-20 LAB — GLUCOSE, CAPILLARY: Glucose-Capillary: 95 mg/dL (ref 70–99)

## 2022-09-20 NOTE — Progress Notes (Signed)
Inpatient Rehabilitation Discharge Medication Review by a Pharmacist  A complete drug regimen review was completed for this patient to identify any potential clinically significant medication issues.  High Risk Drug Classes Is patient taking? Indication by Medication  Antipsychotic No   Anticoagulant No   Antibiotic No   Opioid No   Antiplatelet No   Hypoglycemics/insulin Yes Metformin: DM  Vasoactive Medication Yes Amlodipine, lisinopril: blood pressure Finasteride, flomax: BPH  Chemotherapy No   Other Yes Tylenol, baclofen: pain Allopurinol: gout Lipitor: HLD Dalfampridine, Dimethyl fumarate: MS Mirtazapine: mood/depression Protonix: GERD VitC, vitD, MVI, magnesium, KCl, zinc: supplement Fish oil Miralax: constipation Melatonin -sleep     Type of Medication Issue Identified Description of Issue Recommendation(s)  Drug Interaction(s) (clinically significant)     Duplicate Therapy     Allergy     No Medication Administration End Date     Incorrect Dose     Additional Drug Therapy Needed     Significant med changes from prior encounter (inform family/care partners about these prior to discharge).    Other       Clinically significant medication issues were identified that warrant physician communication and completion of prescribed/recommended actions by midnight of the next day:  No  Name of provider notified for urgent issues identified:    Provider Method of Notification:     Pharmacist comments:    Time spent performing this drug regimen review (minutes): 20

## 2022-09-22 DIAGNOSIS — B348 Other viral infections of unspecified site: Secondary | ICD-10-CM | POA: Insufficient documentation

## 2022-09-22 DIAGNOSIS — E119 Type 2 diabetes mellitus without complications: Secondary | ICD-10-CM

## 2022-09-22 DIAGNOSIS — K5900 Constipation, unspecified: Secondary | ICD-10-CM | POA: Insufficient documentation

## 2022-09-22 DIAGNOSIS — E876 Hypokalemia: Secondary | ICD-10-CM | POA: Insufficient documentation

## 2023-03-25 ENCOUNTER — Encounter (HOSPITAL_COMMUNITY): Payer: Self-pay

## 2023-03-25 ENCOUNTER — Emergency Department (HOSPITAL_COMMUNITY): Payer: No Typology Code available for payment source

## 2023-03-25 ENCOUNTER — Observation Stay (HOSPITAL_COMMUNITY)
Admission: EM | Admit: 2023-03-25 | Discharge: 2023-03-26 | Disposition: A | Discharge status: Home / Self Care | Payer: No Typology Code available for payment source | Attending: Internal Medicine | Admitting: Internal Medicine

## 2023-03-25 DIAGNOSIS — I10 Essential (primary) hypertension: Secondary | ICD-10-CM | POA: Diagnosis present

## 2023-03-25 DIAGNOSIS — I119 Hypertensive heart disease without heart failure: Secondary | ICD-10-CM | POA: Insufficient documentation

## 2023-03-25 DIAGNOSIS — I2722 Pulmonary hypertension due to left heart disease: Secondary | ICD-10-CM | POA: Diagnosis not present

## 2023-03-25 DIAGNOSIS — Z7901 Long term (current) use of anticoagulants: Secondary | ICD-10-CM | POA: Diagnosis not present

## 2023-03-25 DIAGNOSIS — E876 Hypokalemia: Secondary | ICD-10-CM | POA: Diagnosis present

## 2023-03-25 DIAGNOSIS — Z7984 Long term (current) use of oral hypoglycemic drugs: Secondary | ICD-10-CM | POA: Diagnosis not present

## 2023-03-25 DIAGNOSIS — R29898 Other symptoms and signs involving the musculoskeletal system: Principal | ICD-10-CM | POA: Diagnosis present

## 2023-03-25 DIAGNOSIS — I341 Nonrheumatic mitral (valve) prolapse: Secondary | ICD-10-CM | POA: Insufficient documentation

## 2023-03-25 DIAGNOSIS — G35 Multiple sclerosis: Secondary | ICD-10-CM | POA: Diagnosis not present

## 2023-03-25 DIAGNOSIS — E119 Type 2 diabetes mellitus without complications: Secondary | ICD-10-CM

## 2023-03-25 DIAGNOSIS — E1159 Type 2 diabetes mellitus with other circulatory complications: Secondary | ICD-10-CM | POA: Diagnosis present

## 2023-03-25 DIAGNOSIS — R531 Weakness: Principal | ICD-10-CM | POA: Insufficient documentation

## 2023-03-25 DIAGNOSIS — E782 Mixed hyperlipidemia: Secondary | ICD-10-CM | POA: Insufficient documentation

## 2023-03-25 DIAGNOSIS — E11649 Type 2 diabetes mellitus with hypoglycemia without coma: Secondary | ICD-10-CM | POA: Insufficient documentation

## 2023-03-25 HISTORY — DX: Multiple sclerosis: G35

## 2023-03-25 LAB — I-STAT CHEM 8, ED
BUN: 13 mg/dL (ref 8–23)
Calcium, Ion: 1.16 mmol/L (ref 1.15–1.40)
Chloride: 101 mmol/L (ref 98–111)
Creatinine, Ser: 1.2 mg/dL (ref 0.61–1.24)
Glucose, Bld: 113 mg/dL — ABNORMAL HIGH (ref 70–99)
HCT: 39 % (ref 39.0–52.0)
Hemoglobin: 13.3 g/dL (ref 13.0–17.0)
Potassium: 3.3 mmol/L — ABNORMAL LOW (ref 3.5–5.1)
Sodium: 143 mmol/L (ref 135–145)
TCO2: 27 mmol/L (ref 22–32)

## 2023-03-25 LAB — URINALYSIS, ROUTINE W REFLEX MICROSCOPIC
Bilirubin Urine: NEGATIVE
Glucose, UA: NEGATIVE mg/dL
Hgb urine dipstick: NEGATIVE
Ketones, ur: NEGATIVE mg/dL
Leukocytes,Ua: NEGATIVE
Nitrite: NEGATIVE
Protein, ur: NEGATIVE mg/dL
Specific Gravity, Urine: 1.016 (ref 1.005–1.030)
pH: 5 (ref 5.0–8.0)

## 2023-03-25 LAB — COMPREHENSIVE METABOLIC PANEL
ALT: 38 U/L (ref 0–44)
AST: 29 U/L (ref 15–41)
Albumin: 3.4 g/dL — ABNORMAL LOW (ref 3.5–5.0)
Alkaline Phosphatase: 81 U/L (ref 38–126)
Anion gap: 9 (ref 5–15)
BUN: 12 mg/dL (ref 8–23)
CO2: 28 mmol/L (ref 22–32)
Calcium: 8.7 mg/dL — ABNORMAL LOW (ref 8.9–10.3)
Chloride: 104 mmol/L (ref 98–111)
Creatinine, Ser: 1.25 mg/dL — ABNORMAL HIGH (ref 0.61–1.24)
GFR, Estimated: >60 mL/min (ref >60)
Glucose, Bld: 117 mg/dL — ABNORMAL HIGH (ref 70–99)
Potassium: 3.3 mmol/L — ABNORMAL LOW (ref 3.5–5.1)
Sodium: 141 mmol/L (ref 135–145)
Total Bilirubin: 1.1 mg/dL (ref 0.3–1.2)
Total Protein: 6.2 g/dL — ABNORMAL LOW (ref 6.5–8.1)

## 2023-03-25 LAB — CBC
HCT: 38.5 % — ABNORMAL LOW (ref 39.0–52.0)
Hemoglobin: 13.3 g/dL (ref 13.0–17.0)
MCH: 32.9 pg (ref 26.0–34.0)
MCHC: 34.5 g/dL (ref 30.0–36.0)
MCV: 95.3 fL (ref 80.0–100.0)
Platelets: 130 10*3/uL — ABNORMAL LOW (ref 150–400)
RBC: 4.04 MIL/uL — ABNORMAL LOW (ref 4.22–5.81)
RDW: 13.5 % (ref 11.5–15.5)
WBC: 6.4 10*3/uL (ref 4.0–10.5)
nRBC: 0 % (ref 0.0–0.2)

## 2023-03-25 LAB — DIFFERENTIAL
Abs Immature Granulocytes: 0.01 10*3/uL (ref 0.00–0.07)
Basophils Absolute: 0 10*3/uL (ref 0.0–0.1)
Basophils Relative: 1 %
Eosinophils Absolute: 0.1 10*3/uL (ref 0.0–0.5)
Eosinophils Relative: 1 %
Immature Granulocytes: 0 %
Lymphocytes Relative: 9 %
Lymphs Abs: 0.6 10*3/uL — ABNORMAL LOW (ref 0.7–4.0)
Monocytes Absolute: 0.7 10*3/uL (ref 0.1–1.0)
Monocytes Relative: 10 %
Neutro Abs: 5 10*3/uL (ref 1.7–7.7)
Neutrophils Relative %: 79 %

## 2023-03-25 LAB — ETHANOL: Alcohol, Ethyl (B): <10 mg/dL (ref <10)

## 2023-03-25 LAB — CBG MONITORING, ED: Glucose-Capillary: 125 mg/dL — ABNORMAL HIGH (ref 70–99)

## 2023-03-25 MED ORDER — ASPIRIN 325 MG PO TBEC
325.0000 mg | DELAYED_RELEASE_TABLET | Freq: Once | ORAL | Status: AC
  Administered 2023-03-25: 325 mg via ORAL
  Filled 2023-03-25: qty 1

## 2023-03-25 NOTE — ED Triage Notes (Signed)
Patient from home. History of MS, Right sided leg weakness after covid shot yesterday, has a home visit nurse, slid out from his wheelchair called ems.

## 2023-03-25 NOTE — Code Documentation (Signed)
Stroke Response Nurse Documentation Code Documentation  James Gibbs is a 68 y.o. male arriving to [facility]  via [facility] on 03/25/23 with past medical hx of DM, multiple sclerosis, LV hypertrophy, HTN, HLD and mitral valve anterior leaflet prolapse. On No antithrombotic. Code stroke was activated by ED.   Patient from home where he was LKW at 1615 and now complaining of RLE weakness . Pt states he was attempting to move from his chair when his right leg gave out and he fell to the floor. He states he did not hit his head at this time.   Stroke team met pt in CT.  NIHSS 1, see documentation for details and code stroke times. Patient with right leg weakness on exam. The following imaging was completed:  CT Head. Patient is not a candidate for IV Thrombolytic due to outside window. Patient is not a candidate for IR due to no LVO suspected.   Care Plan: Q2H neuro checks and vitals.   Bedside handoff with ED RN James Gibbs.    Mordecai Rasmussen  Stroke Response RN

## 2023-03-25 NOTE — Consult Note (Signed)
NEURO HOSPITALIST CONSULT NOTE   Requestig physician: Dr. Rhae Hammock  Reason for Consult: Acute onset of RLE weakness  History obtained from:  Patient and Chart     HPI:                                                                                                                                          James Gibbs is an 67 y.o. male with a history of multiple sclerosis, DM, LV hypertrophy, HTN, HLD and mitral valve anterior leaflet prolapse who presents to the ED with acute onset of RLE weakness. He was at home when his RLE suddenly gave out on him, causing him to crumple to the floor. Time of symptom onset was approximately 4:15 PM, per patient. He states that chronic knee pain may have contributed to the fall. He feels that his symptoms are most likely due to his MS. He has no prior history of stroke. He does endorse having lower extremity weakness chronically with occasional worsening.   He states that he received Covid and flu vaccinations yesterday. He feels that his worsening may be due to the vaccines, but is not complaining of any fever, chills or other constitutional symptoms.   At baseline he spends about 2 hours exercising on a stationary bicycle daily at home. He follows up with a Neurologist in [place] for his MS. For his MS, he takes dalfampridine and dimethyl fumarate. He is a Cytogeneticist.   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    Malignant hypertension    Mitral valve anterior leaflet prolapse    Mixed hyperlipidemia   Multiple sclerosis  Past Surgical History:  Procedure Laterality Date   COLONOSCOPY WITH PROPOFOL N/A 12/17/2018   Procedure: COLONOSCOPY WITH PROPOFOL;  Surgeon: Bernette Redbird, MD;  Location: [facility];  Service: Endoscopy;  Laterality: N/A;   ESOPHAGOGASTRODUODENOSCOPY (EGD) WITH PROPOFOL N/A 12/14/2018   Procedure: ESOPHAGOGASTRODUODENOSCOPY (EGD)  MODERATE SEDATION;  Surgeon: Bernette Redbird, MD;  Location: [facility];  Service: Endoscopy;  Laterality: N/A;   GIVENS CAPSULE STUDY N/A 12/18/2018   Procedure: GIVENS CAPSULE STUDY;  Surgeon: Willis Modena, MD;  Location: [facility];  Service: Endoscopy;  Laterality: N/A;   none      Family History  Problem Relation Age of Onset   Hypertension Mother    Hypertension Father               Social History:  reports that he has never smoked. He has never used smokeless tobacco. He reports that he does not drink alcohol and does not use drugs.  No Known Allergies  HOME MEDICATIONS:  No current facility-administered medications on file prior to encounter.   Current Outpatient Medications on File Prior to Encounter  Medication Sig Dispense Refill   acetaminophen (TYLENOL) 650 MG CR tablet Take 650 mg by mouth every 8 (eight) hours as needed for pain.     allopurinol (ZYLOPRIM) 300 MG tablet Take 300 mg by mouth daily.     amLODipine (NORVASC) 10 MG tablet Take 10 mg by mouth daily.     Ascorbic Acid (VITAMIN C) 100 MG tablet Take 500 mg by mouth in the morning and at bedtime.     atorvastatin (LIPITOR) 80 MG tablet Take 80 mg by mouth at bedtime.     Baclofen 5 MG TABS Take 1 tablet (5 mg total) by mouth 3 (three) times daily as needed for muscle spasms. 60 tablet 0   calcium carbonate (TUMS - DOSED IN MG ELEMENTAL CALCIUM) 500 MG chewable tablet Chew 1 tablet (200 mg of elemental calcium total) by mouth every 4 (four) hours as needed for indigestion or heartburn. 30 tablet 0   cholecalciferol (VITAMIN D) 400 UNITS TABS tablet Take 400 Units by mouth daily.     dalfampridine 10 MG TB12 Take 1 tablet by mouth every 12 (twelve) hours.     dextromethorphan-guaiFENesin (MUCINEX DM) 30-600 MG 12hr tablet Take 1 tablet by mouth 2 (two) times daily. 60 tablet 0   Dimethyl  Fumarate (TECFIDERA) 240 MG CPDR Take 240 mg by mouth 2 (two) times daily.     finasteride (PROSCAR) 5 MG tablet Take 5 mg by mouth daily.     lisinopril (PRINIVIL,ZESTRIL) 40 MG tablet Take 40 mg by mouth daily.     magnesium oxide (MAG-OX) 400 (240 Mg) MG tablet Take 0.5 tablets (200 mg total) by mouth 2 (two) times daily. 30 tablet 0   melatonin 3 MG TABS tablet Take 1 tablet (3 mg total) by mouth at bedtime as needed (insomnia). 30 tablet 0   metFORMIN (GLUCOPHAGE-XR) 500 MG 24 hr tablet Take 500 mg by mouth daily.     mirtazapine (REMERON) 15 MG tablet Take 15 mg by mouth at bedtime. May repeat with a quarter of a tablet if you wake up - up to twice a night)     Multiple Vitamin (MULTIVITAMIN WITH MINERALS) TABS tablet Take 1 tablet by mouth daily.     Omega-3 Fatty Acids (FISH OIL) 1000 MG CAPS Take 1,000 mg by mouth daily.     pantoprazole (PROTONIX) 40 MG tablet Take 1 tablet (40 mg total) by mouth daily. (Patient taking differently: Take 40 mg by mouth 2 (two) times daily.) 30 tablet 1   polyethylene glycol (MIRALAX / GLYCOLAX) 17 g packet Take 17 g by mouth 2 (two) times daily. 30 each 0   polyvinyl alcohol (LIQUIFILM TEARS) 1.4 % ophthalmic solution Place 1 drop into both eyes 4 (four) times daily. 15 mL 0   potassium chloride (KLOR-CON M) 10 MEQ tablet Take 1 tablet (10 mEq total) by mouth daily. 30 tablet 0   senna-docusate (SENOKOT-S) 8.6-50 MG tablet Take 1 tablet by mouth at bedtime.     tamsulosin (FLOMAX) 0.4 MG CAPS capsule Take 2 capsules (0.8 mg total) by mouth daily after supper. 30 capsule    zinc sulfate 220 (50 Zn) MG capsule Take 1 capsule (220 mg total) by mouth daily after supper. 30 capsule 0     ROS:  As per HPI. He does not endorse any additional symptoms at this time.    Blood pressure (!) 145/80, pulse 83, temperature 99.2 F  (37.3 C), temperature source Oral, resp. rate 19, SpO2 100%.   General Examination:                                                                                                       Physical Exam  HEENT-  [place]/AT    Lungs- Respirations unlabored Extremities- No edema  Neurological Examination Mental Status: Alert, fully oriented, thought content appropriate.  Speech fluent without evidence of aphasia.  Able to follow all commands without difficulty. Cranial Nerves: II: Temporal visual fields intact with no extinction to DSS. Right pupil 3 mm >> 2 mm, left pupil 2 mm >> pinpoint  III,IV, VI: No ptosis. EOMI.  V: Temp sensation equal bilaterally  VII: Smile symmetric VIII: Hearing intact to voice IX,X: No hoarseness XI: Symmetric shoulder shrug XII: Midline tongue extension Motor: BUE 5/5 proximally and distally No pronator drift RLE 4/5 strength to HF, KE, ADF and APF LLE 5/5  Sensory: Light touch intact throughout, bilaterally. No extinction to DSS on almost all trials, but on 1 of the trials he did have extinction to LUE and on the other with extinction to LLE.  Deep Tendon Reflexes:  Right: Brachioradialis 3+, biceps 3+, patellar 1+, achilles 0, toe equivocal Left: Brachioradialis 2+, biceps 2+, patellar 3+, achilles 0, toe downgoing Cerebellar: No ataxia with FNF bilaterally Gait: Deferred    Lab Results: Basic Metabolic Panel: Recent Labs  Lab 03/25/23 2056  NA 143  K 3.3*  CL 101  GLUCOSE 113*  BUN 13  CREATININE 1.20    CBC: Recent Labs  Lab 03/25/23 2056  HGB 13.3  HCT 39.0    Cardiac Enzymes: No results for input(s): "CKTOTAL", "CKMB", "CKMBINDEX", "TROPONINI" in the last 168 hours.  Lipid Panel: No results for input(s): "CHOL", "TRIG", "HDL", "CHOLHDL", "VLDL", "LDLCALC" in the last 168 hours.  Imaging: No results found.   Assessment: 67 year old male with a history of MS, presenting with acute onset of RLE weakness - Exam reveals mild RLE  weakness and intermittent sensory extinction on the left. No facial droop, visual field cut, aphasia or ataxia noted.  - CT head:  No acute intracranial abnormality. ASPECTS is 10. Atrophy with advanced cerebral white matter disease, stable. - Labs: - Mild hypokalemia. Na normal. Ionized Ca normal.  - BUN and Cr normal.  - WBC normal. Hgb 13.3.  - Glucose 113 - Most likely component of the DDx for his presentation is either recrudescence of RLE weakness related to chronic MS lesion versus acute MS exacerbation. Right lumbar radiculopathy is also possible. Stroke is felt to be unlikely.   Recommendations: - MRA of brain without contrast (ordered) - MRI of brain and lumbar spine with and without contrast (ordered).  - TNK is not indicated due to risks outweighing benefits in the setting of low NIHSS.  - Frequent neuro checks.  - ASA 325 mg po x 1 now (ordered)  Electronically signed: Dr. Caryl Pina 03/25/2023, 9:04 PM

## 2023-03-25 NOTE — ED Notes (Signed)
Patient transported to MRI at this time. 

## 2023-03-25 NOTE — ED Provider Notes (Signed)
[place] EMERGENCY DEPARTMENT AT [facility] Provider Note   CSN: [id number] Arrival date & time: 03/25/23  2026     History  Chief Complaint  Patient presents with   Extremity Weakness    Right sided Leg    James Gibbs is a 67 y.o. male.  67 year old male with past medical history of diabetes and MS presenting to the emergency department today with right lower extremity weakness.  This started 2 hours prior to arrival.  The patient got his flu shot yesterday as well as his COVID vaccine.  He states that he was having some mild fatigue and myalgias with this.  He states that tonight that he started noticing right leg weakness 2 hours prior to arrival.  He states that he does have some chronic weakness in his right lower extremity but is able to ambulate without any difficulty.  2 hours prior to arrival he noted that he was feeling more weak in the right leg and was unable to ambulate.  He was brought to the emergency department for further evaluation due to these ongoing symptoms.  The patient denies any headaches.   Extremity Weakness       Home Medications Prior to Admission medications   Medication Sig Start Date End Date Taking? Authorizing Provider  acetaminophen (TYLENOL) 650 MG CR tablet Take 650 mg by mouth every 8 (eight) hours as needed for pain.    [provider]  allopurinol (ZYLOPRIM) 300 MG tablet Take 300 mg by mouth daily.    [provider]  amLODipine (NORVASC) 10 MG tablet Take 10 mg by mouth daily.    [provider]  Ascorbic Acid (VITAMIN C) 100 MG tablet Take 500 mg by mouth in the morning and at bedtime.    [provider]  atorvastatin (LIPITOR) 80 MG tablet Take 80 mg by mouth at bedtime.    [provider]  Baclofen 5 MG TABS Take 1 tablet (5 mg total) by mouth 3 (three) times daily as needed for muscle spasms. 09/19/22   Love, Evlyn Kanner, PA-C  calcium carbonate (TUMS - DOSED IN MG ELEMENTAL  CALCIUM) 500 MG chewable tablet Chew 1 tablet (200 mg of elemental calcium total) by mouth every 4 (four) hours as needed for indigestion or heartburn. 09/19/22   Love, Evlyn Kanner, PA-C  cholecalciferol (VITAMIN D) 400 UNITS TABS tablet Take 400 Units by mouth daily.    [provider]  dalfampridine 10 MG TB12 Take 1 tablet by mouth every 12 (twelve) hours.    [provider]  dextromethorphan-guaiFENesin (MUCINEX DM) 30-600 MG 12hr tablet Take 1 tablet by mouth 2 (two) times daily. 09/19/22   Love, Evlyn Kanner, PA-C  Dimethyl Fumarate (TECFIDERA) 240 MG CPDR Take 240 mg by mouth 2 (two) times daily.    [provider]  finasteride (PROSCAR) 5 MG tablet Take 5 mg by mouth daily.    [provider]  lisinopril (PRINIVIL,ZESTRIL) 40 MG tablet Take 40 mg by mouth daily.    [provider]  magnesium oxide (MAG-OX) 400 (240 Mg) MG tablet Take 0.5 tablets (200 mg total) by mouth 2 (two) times daily. 09/19/22   Love, Evlyn Kanner, PA-C  melatonin 3 MG TABS tablet Take 1 tablet (3 mg total) by mouth at bedtime as needed (insomnia). 09/19/22   Love, Evlyn Kanner, PA-C  metFORMIN (GLUCOPHAGE-XR) 500 MG 24 hr tablet Take 500 mg by mouth daily. 05/29/22   [provider]  mirtazapine (  REMERON) 15 MG tablet Take 15 mg by mouth at bedtime. May repeat with a quarter of a tablet if you wake up - up to twice a night) 06/16/22   [provider]  Multiple Vitamin (MULTIVITAMIN WITH MINERALS) TABS tablet Take 1 tablet by mouth daily.    [provider]  Omega-3 Fatty Acids (FISH OIL) 1000 MG CAPS Take 1,000 mg by mouth daily.    [provider]  pantoprazole (PROTONIX) 40 MG tablet Take 1 tablet (40 mg total) by mouth daily. Patient taking differently: Take 40 mg by mouth 2 (two) times daily. 12/22/18 09/04/22  Rodolph Bong, MD  polyethylene glycol (MIRALAX / GLYCOLAX) 17 g packet Take 17 g by mouth 2 (two) times daily. 09/19/22   Love, Evlyn Kanner, PA-C   polyvinyl alcohol (LIQUIFILM TEARS) 1.4 % ophthalmic solution Place 1 drop into both eyes 4 (four) times daily. 09/15/22   Love, Evlyn Kanner, PA-C  potassium chloride (KLOR-CON M) 10 MEQ tablet Take 1 tablet (10 mEq total) by mouth daily. 09/20/22   Love, Evlyn Kanner, PA-C  senna-docusate (SENOKOT-S) 8.6-50 MG tablet Take 1 tablet by mouth at bedtime. 09/19/22   Love, Evlyn Kanner, PA-C  tamsulosin (FLOMAX) 0.4 MG CAPS capsule Take 2 capsules (0.8 mg total) by mouth daily after supper. 09/15/22   Love, Evlyn Kanner, PA-C  zinc sulfate 220 (50 Zn) MG capsule Take 1 capsule (220 mg total) by mouth daily after supper. 09/19/22   Jacquelynn Cree, PA-C      Allergies    Patient has no known allergies.    Review of Systems   Review of Systems  Musculoskeletal:  Positive for extremity weakness.  Neurological:  Positive for weakness.  All other systems reviewed and are negative.   Physical Exam Updated Vital Signs BP (!) 145/80   Pulse 83   Temp 99.2 F (37.3 C) (Oral)   Resp 19   SpO2 100%  Physical Exam Vitals and nursing note reviewed.   Gen: NAD Eyes: PERRL, EOMI HEENT: no oropharyngeal swelling Neck: trachea midline Resp: clear to auscultation bilaterally Card: RRR, no murmurs, rubs, or gallops Abd: nontender, nondistended Extremities: no calf tenderness, no edema Vascular: 2+ radial pulses bilaterally, 2+ DP pulses bilaterally Neuro: See NIH stroke scale Skin: no rashes Psyc: acting appropriately   ED Results / Procedures / Treatments   Labs (all labs ordered are listed, but only abnormal results are displayed) Labs Reviewed  CBC - Abnormal; Notable for the following components:      Result Value   RBC 4.04 (*)    HCT 38.5 (*)    Platelets 130 (*)    All other components within normal limits  DIFFERENTIAL - Abnormal; Notable for the following components:   Lymphs Abs 0.6 (*)    All other components within normal limits  COMPREHENSIVE METABOLIC PANEL - Abnormal; Notable for the  following components:   Potassium 3.3 (*)    Glucose, Bld 117 (*)    Creatinine, Ser 1.25 (*)    Calcium 8.7 (*)    Total Protein 6.2 (*)    Albumin 3.4 (*)    All other components within normal limits  CBG MONITORING, ED - Abnormal; Notable for the following components:   Glucose-Capillary 125 (*)    All other components within normal limits  I-STAT CHEM 8, ED - Abnormal; Notable for the following components:   Potassium 3.3 (*)    Glucose, Bld 113 (*)    All other components  within normal limits  ETHANOL  RAPID URINE DRUG SCREEN, HOSP PERFORMED  URINALYSIS, ROUTINE W REFLEX MICROSCOPIC  PROTIME-INR  APTT    EKG None  Radiology CT HEAD CODE STROKE WO CONTRAST  Result Date: 03/25/2023 CLINICAL DATA:  Code stroke. Initial evaluation for neuro deficit, stroke, right-sided weakness. EXAM: CT HEAD WITHOUT CONTRAST TECHNIQUE: Contiguous axial images were obtained from the base of the skull through the vertex without intravenous contrast. RADIATION DOSE REDUCTION: This exam was performed according to the departmental dose-optimization program which includes automated exposure control, adjustment of the mA and/or kV according to patient size and/or use of iterative reconstruction technique. COMPARISON:  Comparison made with prior MRI from 09/04/2022. FINDINGS: Brain: Generalized age-related cerebral atrophy with advanced cerebral white matter disease, stable. No acute intracranial hemorrhage. No acute large vessel territory infarct. No mass lesion or midline shift. Mild ventricular prominence without hydrocephalus, stable. No extra-axial fluid collection. Vascular: No abnormal hyperdense vessel. Skull: Scalp soft tissues and calvarium demonstrate no acute finding. Sinuses/Orbits: Globes and orbital soft tissues within normal limits. Scattered mucosal thickening present about the ethmoidal air cells and maxillary sinuses. No significant mastoid effusion. Other: None. ASPECTS [facility] Stroke  Program Early CT Score) - Ganglionic level infarction (caudate, lentiform nuclei, internal capsule, insula, M1-M3 cortex): 7 - Supraganglionic infarction (M4-M6 cortex): 3 Total score (0-10 with 10 being normal): 10 IMPRESSION: 1. No acute intracranial abnormality. 2. ASPECTS is 10. 3. Atrophy with advanced cerebral white matter disease, stable. These results were communicated to Dr. Otelia Limes at 9:10 pm on 03/25/2023 by text page via the [facility] messaging system. Electronically Signed   By: Rise Mu M.D.   On: 03/25/2023 21:11    Procedures Procedures    Medications Ordered in ED Medications  aspirin EC tablet 325 mg (325 mg Oral Given 03/25/23 2338)    ED Course/ Medical Decision Making/ A&P                   NIH Stroke Scale: 1              Medical Decision Making 67 year old male with past medical history of MS and diabetes presenting to the emergency department today with acute onset of right lower extremity weakness starting 2 hours prior to arrival.  At this point it is hard to differentiate whether or not this is due to his multiple sclerosis or potentially new stroke.  I will initiate a code stroke on the patient here.  I will discuss his case with neurology.  I feel that this very well may be due to his underlying MS and an exacerbation of this but given his history of diabetes and hypertension as well as hyperlipidemia I will like to have expedited evaluation by a neurologist as he would be in the window for therapy.  I did discuss the patient's case with Dr. Otelia Limes.  He will evaluate the patient and make further recommendations.  The patient's labs are unremarkable.  Signed out to Dr. Pilar Plate with imaging pending.  Critical care time 36 minutes including initial assessments, reassessments, chart review, coordination of care with neurology    Amount and/or Complexity of Data Reviewed Labs: ordered. Radiology: ordered.           Final Clinical Impression(s) / ED  Diagnoses Final diagnoses:  Right leg weakness    Rx / DC Orders ED Discharge Orders     None         Durwin Glaze, MD 03/25/23 [phone]

## 2023-03-26 ENCOUNTER — Encounter (HOSPITAL_COMMUNITY): Payer: Self-pay | Admitting: Family Medicine

## 2023-03-26 DIAGNOSIS — R29898 Other symptoms and signs involving the musculoskeletal system: Secondary | ICD-10-CM | POA: Diagnosis not present

## 2023-03-26 DIAGNOSIS — E119 Type 2 diabetes mellitus without complications: Secondary | ICD-10-CM | POA: Diagnosis not present

## 2023-03-26 DIAGNOSIS — E876 Hypokalemia: Secondary | ICD-10-CM

## 2023-03-26 DIAGNOSIS — I10 Essential (primary) hypertension: Secondary | ICD-10-CM

## 2023-03-26 LAB — CBC
HCT: 37 % — ABNORMAL LOW (ref 39.0–52.0)
Hemoglobin: 12.5 g/dL — ABNORMAL LOW (ref 13.0–17.0)
MCH: 32.2 pg (ref 26.0–34.0)
MCHC: 33.8 g/dL (ref 30.0–36.0)
MCV: 95.4 fL (ref 80.0–100.0)
Platelets: 123 10*3/uL — ABNORMAL LOW (ref 150–400)
RBC: 3.88 MIL/uL — ABNORMAL LOW (ref 4.22–5.81)
RDW: 13.6 % (ref 11.5–15.5)
WBC: 5.1 10*3/uL (ref 4.0–10.5)
nRBC: 0 % (ref 0.0–0.2)

## 2023-03-26 LAB — BASIC METABOLIC PANEL
Anion gap: 12 (ref 5–15)
BUN: 11 mg/dL (ref 8–23)
CO2: 24 mmol/L (ref 22–32)
Calcium: 8.4 mg/dL — ABNORMAL LOW (ref 8.9–10.3)
Chloride: 103 mmol/L (ref 98–111)
Creatinine, Ser: 1.04 mg/dL (ref 0.61–1.24)
GFR, Estimated: >60 mL/min (ref >60)
Glucose, Bld: 91 mg/dL (ref 70–99)
Potassium: 3.1 mmol/L — ABNORMAL LOW (ref 3.5–5.1)
Sodium: 139 mmol/L (ref 135–145)

## 2023-03-26 LAB — RAPID URINE DRUG SCREEN, HOSP PERFORMED
Amphetamines: NOT DETECTED
Barbiturates: NOT DETECTED
Benzodiazepines: NOT DETECTED
Cocaine: NOT DETECTED
Opiates: NOT DETECTED
Tetrahydrocannabinol: NOT DETECTED

## 2023-03-26 LAB — APTT: aPTT: 31 s (ref 24–36)

## 2023-03-26 LAB — PROTIME-INR
INR: 1.1 (ref 0.8–1.2)
Prothrombin Time: 14.3 s (ref 11.4–15.2)

## 2023-03-26 LAB — HIV ANTIBODY (ROUTINE TESTING W REFLEX): HIV Screen 4th Generation wRfx: NONREACTIVE

## 2023-03-26 LAB — MAGNESIUM: Magnesium: 1.9 mg/dL (ref 1.7–2.4)

## 2023-03-26 MED ORDER — FINASTERIDE 5 MG PO TABS: 5.0000 mg | ORAL_TABLET | Freq: Every day | ORAL | Status: DC

## 2023-03-26 MED ORDER — DALFAMPRIDINE ER 10 MG PO TB12: 10.0000 mg | ORAL_TABLET | Freq: Two times a day (BID) | ORAL | Status: DC

## 2023-03-26 MED ORDER — DIMETHYL FUMARATE 240 MG PO CPDR: 240.0000 mg | DELAYED_RELEASE_CAPSULE | Freq: Two times a day (BID) | ORAL | Status: DC

## 2023-03-26 MED ORDER — POTASSIUM CHLORIDE CRYS ER 20 MEQ PO TBCR
20.0000 meq | EXTENDED_RELEASE_TABLET | Freq: Once | ORAL | Status: AC
  Administered 2023-03-26: 20 meq via ORAL
  Filled 2023-03-26: qty 1

## 2023-03-26 MED ORDER — ENOXAPARIN SODIUM 40 MG/0.4ML IJ SOSY
40.0000 mg | PREFILLED_SYRINGE | INTRAMUSCULAR | Status: DC
  Administered 2023-03-26: 40 mg via SUBCUTANEOUS
  Filled 2023-03-26: qty 0.4

## 2023-03-26 MED ORDER — SENNOSIDES-DOCUSATE SODIUM 8.6-50 MG PO TABS: 1.0000 | ORAL_TABLET | Freq: Every evening | ORAL | Status: DC | PRN

## 2023-03-26 MED ORDER — PANTOPRAZOLE SODIUM 40 MG PO TBEC
40.0000 mg | DELAYED_RELEASE_TABLET | Freq: Two times a day (BID) | ORAL | Status: DC
  Administered 2023-03-26: 40 mg via ORAL
  Filled 2023-03-26: qty 1

## 2023-03-26 MED ORDER — MIRTAZAPINE 15 MG PO TABS
15.0000 mg | ORAL_TABLET | Freq: Every day | ORAL | Status: DC
  Administered 2023-03-26: 15 mg via ORAL
  Filled 2023-03-26: qty 1

## 2023-03-26 MED ORDER — BISACODYL 5 MG PO TBEC: 5.0000 mg | DELAYED_RELEASE_TABLET | Freq: Every day | ORAL | Status: DC | PRN

## 2023-03-26 MED ORDER — ACETAMINOPHEN 325 MG PO TABS: 650.0000 mg | ORAL_TABLET | Freq: Four times a day (QID) | ORAL | Status: DC | PRN

## 2023-03-26 MED ORDER — AMLODIPINE BESYLATE 5 MG PO TABS
10.0000 mg | ORAL_TABLET | Freq: Every day | ORAL | Status: DC
  Administered 2023-03-26: 10 mg via ORAL
  Filled 2023-03-26: qty 2

## 2023-03-26 MED ORDER — TAMSULOSIN HCL 0.4 MG PO CAPS: 0.8000 mg | ORAL_CAPSULE | Freq: Every day | ORAL | Status: DC

## 2023-03-26 MED ORDER — ONDANSETRON HCL 4 MG PO TABS: 4.0000 mg | ORAL_TABLET | Freq: Four times a day (QID) | ORAL | Status: DC | PRN

## 2023-03-26 MED ORDER — BACLOFEN 10 MG PO TABS: 5.0000 mg | ORAL_TABLET | Freq: Three times a day (TID) | ORAL | Status: DC | PRN

## 2023-03-26 MED ORDER — GADOBUTROL 1 MMOL/ML IV SOLN
10.0000 mL | Freq: Once | INTRAVENOUS | Status: AC | PRN
  Administered 2023-03-26: 10 mL via INTRAVENOUS

## 2023-03-26 MED ORDER — ACETAMINOPHEN 650 MG RE SUPP: 650.0000 mg | Freq: Four times a day (QID) | RECTAL | Status: DC | PRN

## 2023-03-26 MED ORDER — ATORVASTATIN CALCIUM 80 MG PO TABS: 80.0000 mg | ORAL_TABLET | Freq: Every day | ORAL | Status: DC

## 2023-03-26 MED ORDER — LISINOPRIL 20 MG PO TABS
40.0000 mg | ORAL_TABLET | Freq: Every day | ORAL | Status: DC
  Administered 2023-03-26: 40 mg via ORAL
  Filled 2023-03-26: qty 2

## 2023-03-26 MED ORDER — POTASSIUM CHLORIDE CRYS ER 20 MEQ PO TBCR: 40.0000 meq | EXTENDED_RELEASE_TABLET | Freq: Once | ORAL | Status: DC

## 2023-03-26 MED ORDER — ALLOPURINOL 100 MG PO TABS
300.0000 mg | ORAL_TABLET | Freq: Every day | ORAL | Status: DC
  Administered 2023-03-26: 300 mg via ORAL
  Filled 2023-03-26: qty 3

## 2023-03-26 MED ORDER — ONDANSETRON HCL 4 MG/2ML IJ SOLN: 4.0000 mg | Freq: Four times a day (QID) | INTRAMUSCULAR | Status: DC | PRN

## 2023-03-26 NOTE — ED Notes (Signed)
Patient back from MRI.

## 2023-03-26 NOTE — Discharge Summary (Signed)
[facility] Physician Discharge Summary   Patient name: James Gibbs  Admit date:     03/25/2023  Discharge date: 03/26/2023  Attending Physician: Briscoe Deutscher [[id number]]  Discharge Physician: Carollee Herter   PCP: Greta Doom, MD  Admitted From: Home  Disposition:  Home  Recommendations for Outpatient Follow-up:  Follow up with PCP at [facility] in 1-2 weeks Follow up with outpatient neurologist at [facility] in 1-2 weeks  Home Health:No Equipment/Devices: None    Discharge Condition:Stable CODE STATUS:FULL Diet recommendation: Diabetic Fluid Restriction: None  Hospital Summary: HPI: James Gibbs is a 67 y.o. male with medical history significant for hypertension, diabetes mellitus, hyperlipidemia, and multiple sclerosis who presents to the emergency department with acute onset of right leg weakness.   Patient notes that he received COVID-19 and influenza vaccinations on 03/24/2023 and wonders if this may have led to his current condition.  He had been in his usual state until shortly after 4 PM yesterday when he felt as though his right leg was unable to support him and he slid to the ground. Aside from the RLE weakness, he has not noticed any new symptoms.     ED Course: Upon arrival to the ED, patient is found to be afebrile and saturating well on room air with normal heart rate and stable blood pressure.  EKG demonstrates sinus rhythm and head CT is negative for acute findings.  Labs are most notable for potassium 3.3, creatinine 1.25, normal WBC, and platelets 130,000.   Neurology was consulted by the ED physician and MRI brain, MRA head, and MRI lumbar spine with and without contrast was ordered.  Significant Events: Admitted 03/25/2023 acute on chronic weakness of right lower leg   Significant Labs:   Significant Imaging Studies: MRI Brain negative for acute CVA MRI lumbar spine showed Right extraforaminal disc protrusion at L3-4, closely approximating  and potentially irritating the exiting right L3 nerve root. 2. Mild clumping of the distal nerve roots of the cauda equina at the level of L5-S1. Findings are nonspecific, and could reflect changes of arachnoiditis, although no associated enhancement is seen. 3. Degenerative spondylosis with resultant moderate right with mild left foraminal narrowing at T11-12, with additional mild bilateral foraminal narrowing at L2-3 through L5-S1 as above.  Antibiotic Therapy: Anti-infectives (From admission, onward)    None       Procedures:   Consultants: Neurology   Hospital Course by Problem: * Right leg weakness Pt admitted for RLE weakness. MRI brain negative for CVA. Shows chronic changes consistent with his hx of MS. Pt seen by physical therapy. No acute PT needs noted. Pt stable for DC back to home. Pt to f/u with his outpatient neurologist and PCP with the [facility] medical center.  Diabetes (HCC) Stable.  Hypokalemia Stable. Pt will need to take OTC potassium supplements at home  HTN (hypertension) Stable. On lisinopril and norvasc.  Multiple sclerosis (HCC) Chronic. Per pharmacy report, pt suppose to be taking Tecfidera 240 mg bid and Ampyra 10 mg bid.    Discharge Diagnoses:  Principal Problem:   Right leg weakness Active Problems:   HTN (hypertension)   Hypokalemia   Diabetes (HCC)   Multiple sclerosis [facility])   Discharge Instructions  Discharge Instructions     Call MD for:  difficulty breathing, headache or visual disturbances   Complete by: As directed    Call MD for:  extreme fatigue   Complete by: As directed    Call MD for:  persistant dizziness or light-headedness   Complete by: As directed    Call MD for:  persistant nausea and vomiting   Complete by: As directed    Call MD for:  temperature >100.4   Complete by: As directed    Diet - low sodium heart healthy   Complete by: As directed    Discharge instructions   Complete by: As directed    1. Follow up  with your primary care physician in 1-2 weeks following discharge. 2. Follow up with your primary neurology in 1-2 weeks following discharge.   Increase activity slowly   Complete by: As directed       Allergies as of 03/26/2023   No Known Allergies      Medication List     TAKE these medications    acetaminophen 325 MG tablet Commonly known as: TYLENOL Take 650 mg by mouth every 6 (six) hours as needed for mild pain (pain score 1-3) or headache.   allopurinol 300 MG tablet Commonly known as: ZYLOPRIM Take 300 mg by mouth daily.   amLODipine 10 MG tablet Commonly known as: NORVASC Take 10 mg by mouth daily.   atorvastatin 80 MG tablet Commonly known as: LIPITOR Take 80 mg by mouth at bedtime.   dalfampridine 10 MG Tb12 Take 1 tablet by mouth every 12 (twelve) hours.   finasteride 5 MG tablet Commonly known as: PROSCAR Take 5 mg by mouth daily.   Fish Oil 1000 MG Caps Take 1,000 mg by mouth daily.   hydrochlorothiazide 25 MG tablet Commonly known as: HYDRODIURIL Take 25 mg by mouth daily.   lisinopril 40 MG tablet Commonly known as: ZESTRIL Take 40 mg by mouth daily.   melatonin 3 MG Tabs tablet Take 1 tablet (3 mg total) by mouth at bedtime as needed (insomnia).   metFORMIN 500 MG 24 hr tablet Commonly known as: GLUCOPHAGE-XR Take 500 mg by mouth daily.   mirtazapine 15 MG tablet Commonly known as: REMERON Take 15 mg by mouth at bedtime. May repeat with a quarter of a tablet if you wake up - up to twice a night)   multivitamin with minerals Tabs tablet Take 1 tablet by mouth daily.   polyethylene glycol 17 g packet Commonly known as: MIRALAX / GLYCOLAX Take 17 g by mouth 2 (two) times daily.   polyvinyl alcohol 1.4 % ophthalmic solution Commonly known as: LIQUIFILM TEARS Place 1 drop into both eyes 4 (four) times daily.   potassium chloride 10 MEQ tablet Commonly known as: KLOR-CON M Take 1 tablet (10 mEq total) by mouth daily.    senna-docusate 8.6-50 MG tablet Commonly known as: Senokot-S Take 1 tablet by mouth at bedtime.   tamsulosin 0.4 MG Caps capsule Commonly known as: FLOMAX Take 2 capsules (0.8 mg total) by mouth daily after supper.   Tecfidera 240 MG Cpdr Generic drug: Dimethyl Fumarate Take 240 mg by mouth 2 (two) times daily.   vitamin C 100 MG tablet Take 500 mg by mouth in the morning and at bedtime.   zinc sulfate 220 (50 Zn) MG capsule Take 1 capsule (220 mg total) by mouth daily after supper.        No Known Allergies  Discharge Exam: Vitals:   03/26/23 0400 03/26/23 0832  BP: 135/89 138/83  Pulse: 79 82  Resp: 16 (!) 26  Temp:  97.7 F (36.5 C)  SpO2: 100% 100%    Physical Exam Vitals and nursing note reviewed.  Constitutional:  General: He is not in acute distress.    Appearance: He is obese. He is not toxic-appearing or diaphoretic.     Comments: Appears chronically ill  HENT:     Head: Normocephalic and atraumatic.     Nose: Nose normal.  Eyes:     General: No scleral icterus. Cardiovascular:     Rate and Rhythm: Normal rate and regular rhythm.  Pulmonary:     Effort: Pulmonary effort is normal.  Abdominal:     General: Abdomen is protuberant. Bowel sounds are normal.     Palpations: Abdomen is soft.  Skin:    General: Skin is warm and dry.     Capillary Refill: Capillary refill takes less than 2 seconds.  Neurological:     Mental Status: He is alert and oriented to person, place, and time.     The results of significant diagnostics from this hospitalization (including imaging, microbiology, ancillary and laboratory) are listed below for reference.     Labs:  Basic Metabolic Panel: Recent Labs  Lab 03/25/23 2054 03/25/23 2056 03/26/23 0508  NA 141 143 139  K 3.3* 3.3* 3.1*  CL 104 101 103  CO2 28  --  24  GLUCOSE 117* 113* 91  BUN 12 13 11   CREATININE 1.25* 1.20 1.04  CALCIUM 8.7*  --  8.4*  MG  --   --  1.9   Liver Function  Tests: Recent Labs  Lab 03/25/23 2054  AST 29  ALT 38  ALKPHOS 81  BILITOT 1.1  PROT 6.2*  ALBUMIN 3.4*   CBC: Recent Labs  Lab 03/25/23 2054 03/25/23 2056 03/26/23 0508  WBC 6.4  --  5.1  NEUTROABS 5.0  --   --   HGB 13.3 13.3 12.5*  HCT 38.5* 39.0 37.0*  MCV 95.3  --  95.4  PLT 130*  --  123*   CBG: Recent Labs  Lab 03/25/23 2038  GLUCAP 125*   Urinalysis    Component Value Date/Time   COLORURINE YELLOW 03/25/2023 2338   APPEARANCEUR CLEAR 03/25/2023 2338   LABSPEC 1.016 03/25/2023 2338   PHURINE 5.0 03/25/2023 2338   GLUCOSEU NEGATIVE 03/25/2023 2338   HGBUR NEGATIVE 03/25/2023 2338   BILIRUBINUR NEGATIVE 03/25/2023 2338   BILIRUBINUR neg 05/10/2013 2025   KETONESUR NEGATIVE 03/25/2023 2338   PROTEINUR NEGATIVE 03/25/2023 2338   UROBILINOGEN 0.2 05/10/2013 2025   NITRITE NEGATIVE 03/25/2023 2338   LEUKOCYTESUR NEGATIVE 03/25/2023 2338   Sepsis Labs Recent Labs  Lab 03/25/23 2054 03/26/23 0508  WBC 6.4 5.1    Procedures/Studies: MR Lumbar Spine W Wo Contrast  Result Date: 03/26/2023 CLINICAL DATA:  Initial evaluation for demyelinating disease, right lower extremity weakness. EXAM: MRI LUMBAR SPINE WITHOUT AND WITH CONTRAST TECHNIQUE: Multiplanar and multiecho pulse sequences of the lumbar spine were obtained without and with intravenous contrast. CONTRAST:  10mL GADAVIST GADOBUTROL 1 MMOL/ML IV SOLN COMPARISON:  Prior radiograph from 05/10/2013. FINDINGS: Segmentation: Standard. Lowest well-formed disc space labeled the L5-S1 level. Alignment: Physiologic with preservation of the normal lumbar lordosis. No significant listhesis. Vertebrae: Vertebral body height maintained without acute or chronic fracture. Bone marrow signal intensity within normal limits. No worrisome osseous lesions. No abnormal marrow edema or enhancement. Conus medullaris and cauda equina: Conus extends to the T12-L1 level. Conus medullaris and visualized distal cord within normal  limits. There is mild clumping of the distal nerve roots of the cauda equina at the level of L5-S1. Nerve roots are somewhat displaced anteriorly and to the  right (series 15, image 38). Findings are nonspecific, and could reflect changes of arachnoiditis. An underlying arachnoid web or cyst could also be considered. No abnormal enhancement. Paraspinal and other soft tissues: Paraspinous soft tissues demonstrate no acute finding. Chronic fatty atrophy noted within the posterior paraspinous musculature. Multiple scattered T2 hyperintense cyst noted about the visualized kidneys, largest of which measures 2 cm on the left. These are benign in appearance, with no follow-up imaging recommended. Disc levels: T11-12: Disc desiccation with diffuse disc bulge. Mild bilateral facet hypertrophy. No spinal stenosis. Moderate right with mild left foraminal narrowing. T12-L1: Normal interspace. Mild bilateral facet hypertrophy. No stenosis. L1-2: Normal interspace. Mild bilateral facet hypertrophy. No stenosis. L2-3: Degenerative intervertebral disc space narrowing with diffuse disc bulge. Mild reactive endplate spurring. Mild right greater than left facet hypertrophy. Mild narrowing of the lateral recesses bilaterally. Central canal remains patent. Mild right greater than left L2 foraminal stenosis. L3-4: Degenerative intervertebral disc space narrowing with diffuse disc bulge and disc desiccation. Superimposed right extraforaminal disc protrusion closely approximates the exiting right L3 nerve root (series 15, image 28). Mild to moderate bilateral facet hypertrophy. Epidural lipomatosis. No significant spinal stenosis. Mild bilateral L3 foraminal narrowing. L4-5: Degenerative intervertebral disc space narrowing with disc desiccation and diffuse disc bulge. Reactive endplate change with marginal endplate osteophytic spurring. Mild to moderate bilateral facet hypertrophy. No significant spinal stenosis. Mild bilateral L4 foraminal  narrowing. L5-S1: Mild disc bulge with reactive endplate spurring. Mild facet hypertrophy. No spinal stenosis. Mild bilateral L5 foraminal narrowing. IMPRESSION: 1. Right extraforaminal disc protrusion at L3-4, closely approximating and potentially irritating the exiting right L3 nerve root. 2. Mild clumping of the distal nerve roots of the cauda equina at the level of L5-S1. Findings are nonspecific, and could reflect changes of arachnoiditis, although no associated enhancement is seen. An underlying arachnoid web or cyst could also be considered. Further evaluation with dedicated CT myelogram could be performed for further evaluation as warranted. 3. Degenerative spondylosis with resultant moderate right with mild left foraminal narrowing at T11-12, with additional mild bilateral foraminal narrowing at L2-3 through L5-S1 as above. Electronically Signed   By: Rise Mu M.D.   On: 03/26/2023 01:59   MR ANGIO HEAD WO CONTRAST  Result Date: 03/26/2023 CLINICAL DATA:  Initial evaluation for neuro deficit, stroke suspected. EXAM: MRA HEAD WITHOUT CONTRAST TECHNIQUE: Angiographic images of the Circle of Willis were acquired using MRA technique without intravenous contrast. COMPARISON:  Brain MRI from the same day. FINDINGS: Anterior circulation: Both internal carotid arteries are widely patent to the termini without stenosis or other abnormality. A1 segments patent bilaterally. Normal anterior communicating artery complex. Visualized anterior cerebral arteries patent without stenosis. No M1 stenosis or occlusion. Distal MCA branches perfused and fairly symmetric. Posterior circulation: Both V4 segments patent without stenosis. Both PICA patent. Basilar patent without stenosis. Superior cerebellar arteries patent bilaterally. Both PCAs primarily supplied via the basilar. PCAs patent to their distal aspects without visible stenosis. Anatomic variants: None significant. Other: No intracranial aneurysm.  IMPRESSION: Negative intracranial MRA. No large vessel occlusion. No hemodynamically significant or correctable stenosis. Electronically Signed   By: Rise Mu M.D.   On: 03/26/2023 01:42   MR BRAIN W WO CONTRAST  Result Date: 03/26/2023 CLINICAL DATA:  Initial evaluation for demyelinating disease. EXAM: MRI HEAD WITHOUT AND WITH CONTRAST TECHNIQUE: Multiplanar, multiecho pulse sequences of the brain and surrounding structures were obtained without and with intravenous contrast. CONTRAST:  10mL GADAVIST GADOBUTROL 1 MMOL/ML IV SOLN COMPARISON:  Prior CT from 03/25/2023 and MRI from 09/04/2022. FINDINGS: Brain: Generalized cerebral atrophy. Patchy and confluent T2/FLAIR hyperintensity involving the periventricular, deep, and juxta cortical white matter both cerebral hemispheres, moderate to advanced in nature. Multiple corresponding T1 black holes. Findings consistent with provided history of multiple sclerosis. No abnormal enhancement to suggest active demyelination. No evidence for acute or subacute ischemia. No areas of chronic cortical infarction. No acute intracranial hemorrhage. Single punctate chronic microhemorrhage noted within the right occipital lobe. No mass lesion, midline shift or mass effect. Mild ventricular prominence related to global parenchymal volume loss of hydrocephalus. No extra-axial fluid collection. Empty sella noted. No abnormal enhancement. Vascular: Major intracranial vascular flow voids are maintained. Skull and upper cervical spine: Craniocervical junction unremarkable. Bone marrow signal intensity within normal limits. No scalp soft tissue abnormality. Sinuses/Orbits: Globes and orbital soft tissues within normal limits. Mild polypoid mucosal thickening noted about the ethmoidal air cells and maxillary sinuses. Mastoid air cells are clear. Other: None. IMPRESSION: 1. Patchy and confluent T2/FLAIR hyperintensity involving the supratentorial white matter, consistent with  provided history of chronic demyelinating disease/multiple sclerosis. No abnormal enhancement to suggest active demyelination. 2. No other acute intracranial abnormality. 3. Generalized cerebral atrophy. Electronically Signed   By: Rise Mu M.D.   On: 03/26/2023 01:38   CT HEAD CODE STROKE WO CONTRAST  Result Date: 03/25/2023 CLINICAL DATA:  Code stroke. Initial evaluation for neuro deficit, stroke, right-sided weakness. EXAM: CT HEAD WITHOUT CONTRAST TECHNIQUE: Contiguous axial images were obtained from the base of the skull through the vertex without intravenous contrast. RADIATION DOSE REDUCTION: This exam was performed according to the departmental dose-optimization program which includes automated exposure control, adjustment of the mA and/or kV according to patient size and/or use of iterative reconstruction technique. COMPARISON:  Comparison made with prior MRI from 09/04/2022. FINDINGS: Brain: Generalized age-related cerebral atrophy with advanced cerebral white matter disease, stable. No acute intracranial hemorrhage. No acute large vessel territory infarct. No mass lesion or midline shift. Mild ventricular prominence without hydrocephalus, stable. No extra-axial fluid collection. Vascular: No abnormal hyperdense vessel. Skull: Scalp soft tissues and calvarium demonstrate no acute finding. Sinuses/Orbits: Globes and orbital soft tissues within normal limits. Scattered mucosal thickening present about the ethmoidal air cells and maxillary sinuses. No significant mastoid effusion. Other: None. ASPECTS [facility] Stroke Program Early CT Score) - Ganglionic level infarction (caudate, lentiform nuclei, internal capsule, insula, M1-M3 cortex): 7 - Supraganglionic infarction (M4-M6 cortex): 3 Total score (0-10 with 10 being normal): 10 IMPRESSION: 1. No acute intracranial abnormality. 2. ASPECTS is 10. 3. Atrophy with advanced cerebral white matter disease, stable. These results were communicated to  Dr. Otelia Limes at 9:10 pm on 03/25/2023 by text page via the [facility] messaging system. Electronically Signed   By: Rise Mu M.D.   On: 03/25/2023 21:11    Time coordinating discharge: 40 mins  SIGNED:  Carollee Herter, DO [facility] 03/26/23, 11:30 AM

## 2023-03-26 NOTE — Assessment & Plan Note (Signed)
Stable

## 2023-03-26 NOTE — Assessment & Plan Note (Signed)
Pt admitted for RLE weakness. MRI brain negative for CVA. Shows chronic changes consistent with his hx of MS. Pt seen by physical therapy. No acute PT needs noted. Pt stable for DC back to home. Pt to f/u with his outpatient neurologist and PCP with the [facility] medical center.

## 2023-03-26 NOTE — Assessment & Plan Note (Signed)
Chronic. Per pharmacy report, pt suppose to be taking Tecfidera 240 mg bid and Ampyra 10 mg bid.

## 2023-03-26 NOTE — H&P (Signed)
History and Physical    TARRELL LEADBEATER ZOX:[id number] DOB: [birth year] DOA: 03/25/2023  PCP: Greta Doom, MD   Patient coming from: Home   Chief Complaint: Right leg weakness   HPI: James Gibbs is a 67 y.o. male with medical history significant for hypertension, diabetes mellitus, hyperlipidemia, and multiple sclerosis who presents to the emergency department with acute onset of right leg weakness.  Patient notes that he received COVID-19 and influenza vaccinations on 03/24/2023 and wonders if this may have led to his current condition.  He had been in his usual state until shortly after 4 PM yesterday when he felt as though his right leg was unable to support him and he slid to the ground. Aside from the RLE weakness, he has not noticed any new symptoms.    ED Course: Upon arrival to the ED, patient is found to be afebrile and saturating well on room air with normal heart rate and stable blood pressure.  EKG demonstrates sinus rhythm and head CT is negative for acute findings.  Labs are most notable for potassium 3.3, creatinine 1.25, normal WBC, and platelets 130,000.  Neurology was consulted by the ED physician and MRI brain, MRA head, and MRI lumbar spine with and without contrast was ordered.  Review of Systems:  All other systems reviewed and apart from HPI, are negative.  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    Malignant hypertension    Mitral valve anterior leaflet prolapse    Mixed hyperlipidemia    Multiple sclerosis (HCC)     Past Surgical History:  Procedure Laterality Date   COLONOSCOPY WITH PROPOFOL N/A 12/17/2018   Procedure: COLONOSCOPY WITH PROPOFOL;  Surgeon: Bernette Redbird, MD;  Location: [facility];  Service: Endoscopy;  Laterality: N/A;   ESOPHAGOGASTRODUODENOSCOPY (EGD) WITH PROPOFOL N/A 12/14/2018   Procedure: ESOPHAGOGASTRODUODENOSCOPY (EGD) MODERATE SEDATION;   Surgeon: Bernette Redbird, MD;  Location: [facility];  Service: Endoscopy;  Laterality: N/A;   GIVENS CAPSULE STUDY N/A 12/18/2018   Procedure: GIVENS CAPSULE STUDY;  Surgeon: Willis Modena, MD;  Location: [facility];  Service: Endoscopy;  Laterality: N/A;   none      Social History:   reports that he has never smoked. He has never used smokeless tobacco. He reports that he does not drink alcohol and does not use drugs.  No Known Allergies  Family History  Problem Relation Age of Onset   Hypertension Mother    Hypertension Father      Prior to Admission medications   Medication Sig Start Date End Date Taking? Authorizing Provider  acetaminophen (TYLENOL) 650 MG CR tablet Take 650 mg by mouth every 8 (eight) hours as needed for pain.    [provider]  allopurinol (ZYLOPRIM) 300 MG tablet Take 300 mg by mouth daily.    [provider]  amLODipine (NORVASC) 10 MG tablet Take 10 mg by mouth daily.    [provider]  Ascorbic Acid (VITAMIN C) 100 MG tablet Take 500 mg by mouth in the morning and at bedtime.    [provider]  atorvastatin (LIPITOR) 80 MG tablet Take 80 mg by mouth at bedtime.    [provider]  Baclofen 5 MG TABS Take 1 tablet (5 mg total) by mouth 3 (three) times daily as needed for muscle spasms. 09/19/22   Love, Evlyn Kanner, PA-C  calcium carbonate (TUMS - DOSED IN MG ELEMENTAL CALCIUM)  500 MG chewable tablet Chew 1 tablet (200 mg of elemental calcium total) by mouth every 4 (four) hours as needed for indigestion or heartburn. 09/19/22   Love, Evlyn Kanner, PA-C  cholecalciferol (VITAMIN D) 400 UNITS TABS tablet Take 400 Units by mouth daily.    [provider]  dalfampridine 10 MG TB12 Take 1 tablet by mouth every 12 (twelve) hours.    [provider]  dextromethorphan-guaiFENesin (MUCINEX DM) 30-600 MG 12hr tablet Take 1 tablet by mouth 2 (two) times daily. 09/19/22   Love, Evlyn Kanner, PA-C  Dimethyl Fumarate  (TECFIDERA) 240 MG CPDR Take 240 mg by mouth 2 (two) times daily.    [provider]  finasteride (PROSCAR) 5 MG tablet Take 5 mg by mouth daily.    [provider]  lisinopril (PRINIVIL,ZESTRIL) 40 MG tablet Take 40 mg by mouth daily.    [provider]  magnesium oxide (MAG-OX) 400 (240 Mg) MG tablet Take 0.5 tablets (200 mg total) by mouth 2 (two) times daily. 09/19/22   Love, Evlyn Kanner, PA-C  melatonin 3 MG TABS tablet Take 1 tablet (3 mg total) by mouth at bedtime as needed (insomnia). 09/19/22   Love, Evlyn Kanner, PA-C  metFORMIN (GLUCOPHAGE-XR) 500 MG 24 hr tablet Take 500 mg by mouth daily. 05/29/22   [provider]  mirtazapine (REMERON) 15 MG tablet Take 15 mg by mouth at bedtime. May repeat with a quarter of a tablet if you wake up - up to twice a night) 06/16/22   [provider]  Multiple Vitamin (MULTIVITAMIN WITH MINERALS) TABS tablet Take 1 tablet by mouth daily.    [provider]  Omega-3 Fatty Acids (FISH OIL) 1000 MG CAPS Take 1,000 mg by mouth daily.    [provider]  pantoprazole (PROTONIX) 40 MG tablet Take 1 tablet (40 mg total) by mouth daily. Patient taking differently: Take 40 mg by mouth 2 (two) times daily. 12/22/18 09/04/22  Rodolph Bong, MD  polyethylene glycol (MIRALAX / GLYCOLAX) 17 g packet Take 17 g by mouth 2 (two) times daily. 09/19/22   Love, Evlyn Kanner, PA-C  polyvinyl alcohol (LIQUIFILM TEARS) 1.4 % ophthalmic solution Place 1 drop into both eyes 4 (four) times daily. 09/15/22   Love, Evlyn Kanner, PA-C  potassium chloride (KLOR-CON M) 10 MEQ tablet Take 1 tablet (10 mEq total) by mouth daily. 09/20/22   Love, Evlyn Kanner, PA-C  senna-docusate (SENOKOT-S) 8.6-50 MG tablet Take 1 tablet by mouth at bedtime. 09/19/22   Love, Evlyn Kanner, PA-C  tamsulosin (FLOMAX) 0.4 MG CAPS capsule Take 2 capsules (0.8 mg total) by mouth daily after supper. 09/15/22   Love, Evlyn Kanner, PA-C  zinc sulfate 220 (50 Zn) MG capsule Take 1  capsule (220 mg total) by mouth daily after supper. 09/19/22   Jacquelynn Cree, PA-C    Physical Exam: Vitals:   03/25/23 2031 03/25/23 2035 03/25/23 2035  BP: (!) 145/80    Pulse:  83   Resp:  19   Temp:   99.2 F (37.3 C)  TempSrc:   Oral  SpO2:  100%     Constitutional: NAD, no pallor or diaphoresis   Eyes: PERTLA, lids and conjunctivae normal ENMT: Mucous membranes are moist. Posterior pharynx clear of any exudate or lesions.   Neck: supple, no masses  Respiratory: no wheezing, no crackles. No accessory muscle use.  Cardiovascular: S1 & S2 heard, regular rate and rhythm. No JVD. Abdomen: No distension, no tenderness, soft.  Bowel sounds active.  Musculoskeletal: no clubbing / cyanosis. No joint deformity upper and lower extremities.   Skin: no significant rashes, lesions, ulcers. Warm, dry, well-perfused. Neurologic: CN 2-12 grossly intact. Strength 4/5 throughout RLE and otherwise 5/5. Alert and oriented.  Psychiatric: Calm. Cooperative.    Labs and Imaging on Admission: I have personally reviewed following labs and imaging studies  CBC: Recent Labs  Lab 03/25/23 2054 03/25/23 2056  WBC 6.4  --   NEUTROABS 5.0  --   HGB 13.3 13.3  HCT 38.5* 39.0  MCV 95.3  --   PLT 130*  --    Basic Metabolic Panel: Recent Labs  Lab 03/25/23 2054 03/25/23 2056  NA 141 143  K 3.3* 3.3*  CL 104 101  CO2 28  --   GLUCOSE 117* 113*  BUN 12 13  CREATININE 1.25* 1.20  CALCIUM 8.7*  --    GFR: CrCl cannot be calculated (Unknown ideal weight.). Liver Function Tests: Recent Labs  Lab 03/25/23 2054  AST 29  ALT 38  ALKPHOS 81  BILITOT 1.1  PROT 6.2*  ALBUMIN 3.4*   No results for input(s): "LIPASE", "AMYLASE" in the last 168 hours. No results for input(s): "AMMONIA" in the last 168 hours. Coagulation Profile: Recent Labs  Lab 03/25/23 2338  INR 1.1   Cardiac Enzymes: No results for input(s): "CKTOTAL", "CKMB", "CKMBINDEX", "TROPONINI" in the last 168 hours. BNP  (last 3 results) No results for input(s): "PROBNP" in the last 8760 hours. HbA1C: No results for input(s): "HGBA1C" in the last 72 hours. CBG: Recent Labs  Lab 03/25/23 2038  GLUCAP 125*   Lipid Profile: No results for input(s): "CHOL", "HDL", "LDLCALC", "TRIG", "CHOLHDL", "LDLDIRECT" in the last 72 hours. Thyroid Function Tests: No results for input(s): "TSH", "T4TOTAL", "FREET4", "T3FREE", "THYROIDAB" in the last 72 hours. Anemia Panel: No results for input(s): "VITAMINB12", "FOLATE", "FERRITIN", "TIBC", "IRON", "RETICCTPCT" in the last 72 hours. Urine analysis:    Component Value Date/Time   COLORURINE YELLOW 03/25/2023 2338   APPEARANCEUR CLEAR 03/25/2023 2338   LABSPEC 1.016 03/25/2023 2338   PHURINE 5.0 03/25/2023 2338   GLUCOSEU NEGATIVE 03/25/2023 2338   HGBUR NEGATIVE 03/25/2023 2338   BILIRUBINUR NEGATIVE 03/25/2023 2338   BILIRUBINUR neg 05/10/2013 2025   KETONESUR NEGATIVE 03/25/2023 2338   PROTEINUR NEGATIVE 03/25/2023 2338   UROBILINOGEN 0.2 05/10/2013 2025   NITRITE NEGATIVE 03/25/2023 2338   LEUKOCYTESUR NEGATIVE 03/25/2023 2338   Sepsis Labs: @LABRCNTIP (procalcitonin:4,lacticidven:4) )No results found for this or any previous visit (from the past 240 hour(s)).   Radiological Exams on Admission: CT HEAD CODE STROKE WO CONTRAST  Result Date: 03/25/2023 CLINICAL DATA:  Code stroke. Initial evaluation for neuro deficit, stroke, right-sided weakness. EXAM: CT HEAD WITHOUT CONTRAST TECHNIQUE: Contiguous axial images were obtained from the base of the skull through the vertex without intravenous contrast. RADIATION DOSE REDUCTION: This exam was performed according to the departmental dose-optimization program which includes automated exposure control, adjustment of the mA and/or kV according to patient size and/or use of iterative reconstruction technique. COMPARISON:  Comparison made with prior MRI from 09/04/2022. FINDINGS: Brain: Generalized age-related cerebral  atrophy with advanced cerebral white matter disease, stable. No acute intracranial hemorrhage. No acute large vessel territory infarct. No mass lesion or midline shift. Mild ventricular prominence without hydrocephalus, stable. No extra-axial fluid collection. Vascular: No abnormal hyperdense vessel. Skull: Scalp soft tissues and calvarium demonstrate no acute finding. Sinuses/Orbits: Globes and orbital soft tissues within normal limits. Scattered mucosal thickening present about  the ethmoidal air cells and maxillary sinuses. No significant mastoid effusion. Other: None. ASPECTS [facility] Stroke Program Early CT Score) - Ganglionic level infarction (caudate, lentiform nuclei, internal capsule, insula, M1-M3 cortex): 7 - Supraganglionic infarction (M4-M6 cortex): 3 Total score (0-10 with 10 being normal): 10 IMPRESSION: 1. No acute intracranial abnormality. 2. ASPECTS is 10. 3. Atrophy with advanced cerebral white matter disease, stable. These results were communicated to Dr. Otelia Limes at 9:10 pm on 03/25/2023 by text page via the [facility] messaging system. Electronically Signed   By: Rise Mu M.D.   On: 03/25/2023 21:11    EKG: Independently reviewed. Sinus rhythm, 1st degree AV block.   Assessment/Plan   1. Acute RLE weakness  - Appreciate neurology consultation   - Follow-up MRI findings, continue supportive care, consult PT    2. Multiple sclerosis  - Follows with neurology in [place] and managed with dimethyl fumarate and dalfampridine    3. Hypokalemia  - Replacing    4. Type II DM  - Check CBGs and use low-intensity SSI for now    5. Hypertension  - Continue Norvasc and lisinopril     DVT prophylaxis: Lovenox  Code Status: Full  Level of Care: Level of care: Med-Surg Family Communication: None present   Disposition Plan:  Patient is from: home  Anticipated d/c is to: TBD Anticipated d/c date is: Possibly as early as 03/27/23  Patient currently: Pending further imaging,  PT eval  Consults called: Neurology  Admission status: Observation     Briscoe Deutscher, MD [facility]  03/26/2023, 1:23 AM

## 2023-03-26 NOTE — ED Notes (Signed)
Assumed care of patient, NAD noted at this time, pt resting in bed, respirations even and unlabored, skin warm and dry, bed in low position, call light within reach. SR noted on cardiac monitor. Comfort measures offered. Will continue to monitor.

## 2023-03-26 NOTE — Hospital Course (Signed)
HPI: James Gibbs is a 67 y.o. male with medical history significant for hypertension, diabetes mellitus, hyperlipidemia, and multiple sclerosis who presents to the emergency department with acute onset of right leg weakness.   Patient notes that he received COVID-19 and influenza vaccinations on 03/24/2023 and wonders if this may have led to his current condition.  He had been in his usual state until shortly after 4 PM yesterday when he felt as though his right leg was unable to support him and he slid to the ground. Aside from the RLE weakness, he has not noticed any new symptoms.     ED Course: Upon arrival to the ED, patient is found to be afebrile and saturating well on room air with normal heart rate and stable blood pressure.  EKG demonstrates sinus rhythm and head CT is negative for acute findings.  Labs are most notable for potassium 3.3, creatinine 1.25, normal WBC, and platelets 130,000.   Neurology was consulted by the ED physician and MRI brain, MRA head, and MRI lumbar spine with and without contrast was ordered.  Significant Events: Admitted 03/25/2023 acute on chronic weakness of right lower leg   Significant Labs:   Significant Imaging Studies: MRI Brain negative for acute CVA MRI lumbar spine showed Right extraforaminal disc protrusion at L3-4, closely approximating and potentially irritating the exiting right L3 nerve root. 2. Mild clumping of the distal nerve roots of the cauda equina at the level of L5-S1. Findings are nonspecific, and could reflect changes of arachnoiditis, although no associated enhancement is seen. 3. Degenerative spondylosis with resultant moderate right with mild left foraminal narrowing at T11-12, with additional mild bilateral foraminal narrowing at L2-3 through L5-S1 as above.  Antibiotic Therapy: Anti-infectives (From admission, onward)    None       Procedures:   Consultants: Neurology

## 2023-03-26 NOTE — Evaluation (Signed)
Physical Therapy Evaluation Patient Details Name: James Gibbs MRN: [id number] DOB: [birth year] Today's Date: 03/26/2023  History of Present Illness  Pt is a 67 y.o. male who presented 03/25/23 with R lower extremity weakness. Pt received his flu and COVID shots the day before. MRI brain negative for acute intracranial abnormality and not suggesting active demyelination. Negative intracranial MRA. PMH: MS with R LE weakness, HTN, HLD, DM2, BPH, GERD, gout, mitral valve anterior leaflet prolapse  Clinical Impression  Pt presented to physical therapy with deficits in strength, mobility, and activity tolerance. The pt was able to ambulate household distances with the use of a rollator. PT observed the pt drags R foot and with fatigue the RLE bounces during weight acceptance. The pt tolerated the treatment well with moderate increase in fatigue following ambulation. The pt will continue to benefit from skilled PT to address remaining functional deficits. Acute rehab recommends PT resume current home health PT.    If plan is discharge home, recommend the following: A little help with walking and/or transfers;Assistance with cooking/housework;Assist for transportation;A little help with bathing/dressing/bathroom   Can travel by private vehicle        Equipment Recommendations None recommended by PT  Recommendations for Other Services       Functional Status Assessment Patient has had a recent decline in their functional status and demonstrates the ability to make significant improvements in function in a reasonable and predictable amount of time.     Precautions / Restrictions Precautions Precautions: Fall Precaution Comments: recent falls Restrictions Weight Bearing Restrictions: No      Mobility  Bed Mobility Overal bed mobility: Needs Assistance Bed Mobility: Supine to Sit, Sit to Supine     Supine to sit: Min assist (Required min assist to facilitate trunk flexion) Sit to  supine: Modified independent (Device/Increase time)   General bed mobility comments: Required minA for supine to sit to facilitate trunk flexion    Transfers Overall transfer level: Needs assistance Equipment used: Rollator (4 wheels) Transfers: Sit to/from Stand Sit to Stand: Contact guard assist                Ambulation/Gait Ambulation/Gait assistance: Contact guard assist Gait Distance (Feet): 300 Feet Assistive device: Rollator (4 wheels) Gait Pattern/deviations: Step-through pattern, Decreased stride length, Trunk flexed (R foot drags during ambulation) Gait velocity: Decreased Gait velocity interpretation: <1.8 ft/sec, indicate of risk for recurrent falls   General Gait Details: R foot drag and with increased fatigue the pt began to bounce during weight acceptance on RLE. Pt maintains excessive trunk flexion during ambulation.  Stairs            Wheelchair Mobility     Tilt Bed    Modified Rankin (Stroke Patients Only)       Balance Overall balance assessment: Modified Independent                                           Pertinent Vitals/Pain Pain Assessment Pain Assessment: Faces Faces Pain Scale: No hurt    Home Living Family/patient expects to be discharged to:: Private residence Living Arrangements: Alone Available Help at Discharge: Family;Available PRN/intermittently;Friend(s) (Daughter can come help intermittenly. Has an aide mon-friday(for two hours). Friends can drive pt to doctor appointments/) Type of Home: House Home Access: Stairs to enter Entrance Stairs-Rails: Can reach both Entrance Stairs-Number of Steps: 2   Home Layout:  One level Home Equipment: Rollator (4 wheels);Shower seat;Grab bars - tub/shower;Lift chair;Cane - quad;Cane - single point;Other (comment) (Upright rollator) Additional Comments: Pt live alone, aide comes for two hours to cook and do laundry (mon-friday). Pt has HHPT 1x week currently.     Prior Function Prior Level of Function : Needs assist       Physical Assist : ADLs (physical)   ADLs (physical): IADLs Mobility Comments: Fell from a lift chair yesterday while trying to put a pillow under his bottom. Pt fell on butt and couldn't get up from the floor, had to use life alert for help. Pt typically prefers to use upright rollator. ADLs Comments: Aide comes two hours (mon-frid) to assist with cooking, cleaning, and laundry. Uses meals on wheels when aide is not present     Extremity/Trunk Assessment   Upper Extremity Assessment Upper Extremity Assessment: Overall WFL for tasks assessed    Lower Extremity Assessment Lower Extremity Assessment: RLE deficits/detail RLE Deficits / Details: Right foot drag during ambulation. 4/5 strength in RLE (5/5 in LLE) RLE Sensation: WNL RLE Coordination: WNL    Cervical / Trunk Assessment Cervical / Trunk Assessment: Normal  Communication   Communication Communication: No apparent difficulties Cueing Techniques: Verbal cues;Tactile cues  Cognition Arousal: Alert Behavior During Therapy: WFL for tasks assessed/performed Overall Cognitive Status: Within Functional Limits for tasks assessed                                          General Comments General comments (skin integrity, edema, etc.): VSS    Exercises     Assessment/Plan    PT Assessment Patient needs continued PT services  PT Problem List Decreased strength;Decreased activity tolerance;Decreased balance;Decreased mobility       PT Treatment Interventions Gait training;Functional mobility training;Therapeutic activities;Therapeutic exercise;Balance training;Neuromuscular re-education;Stair training    PT Goals (Current goals can be found in the Care Plan section)  Acute Rehab PT Goals Patient Stated Goal: To get better PT Goal Formulation: With patient Time For Goal Achievement: 04/09/23 Potential to Achieve Goals: Good    Frequency  Min 1X/week     Co-evaluation               AM-PAC PT "6 Clicks" Mobility  Outcome Measure Help needed turning from your back to your side while in a flat bed without using bedrails?: None Help needed moving from lying on your back to sitting on the side of a flat bed without using bedrails?: A Little Help needed moving to and from a bed to a chair (including a wheelchair)?: A Little Help needed standing up from a chair using your arms (e.g., wheelchair or bedside chair)?: A Little Help needed to walk in hospital room?: A Little Help needed climbing 3-5 steps with a railing? : A Little 6 Click Score: 19    End of Session   Activity Tolerance: Patient tolerated treatment well Patient left: in bed;with call bell/phone within reach;with nursing/sitter in room Nurse Communication: Mobility status PT Visit Diagnosis: Unsteadiness on feet (R26.81);Other abnormalities of gait and mobility (R26.89);Repeated falls (R29.6);History of falling (Z91.81)    Time: 1610-9604 PT Time Calculation (min) (ACUTE ONLY): 45 min   Charges:   PT Evaluation $PT Eval Low Complexity: 1 Low PT Treatments $Gait Training: 8-22 mins $Therapeutic Activity: 8-22 mins PT General Charges $$ ACUTE PT VISIT: 1 Visit  Caryl Comes, SPT Acute Rehabilitation Office Phone [phone]   Caryl Comes 03/26/2023, 10:55 AM

## 2023-03-26 NOTE — ED Provider Notes (Signed)
  Provider Note MRN:  [id number]  Arrival date & time: 03/26/23    ED Course and Medical Decision Making  Assumed care from Dr. Rhae Hammock at shift change.  Right leg weakness ?stroke awaiting MRI.  Discussed case with neurology while awaiting MRI, admitted to hospital service for further care.  Procedures  Final Clinical Impressions(s) / ED Diagnoses     ICD-10-CM   1. Right leg weakness  R29.898       ED Discharge Orders     None       Discharge Instructions   None     Elmer Sow. Pilar Plate, MD [facility] Emergency Medicine [facility] [email or web address]    Sabas Sous, MD 03/26/23 [phone]

## 2023-03-26 NOTE — Assessment & Plan Note (Signed)
Stable. On lisinopril and norvasc.

## 2023-03-26 NOTE — ED Notes (Signed)
NAD noted at this time, pt resting in bed, respirations even and unlabored, skin warm and dry, bed in low position, call light within reach. Will continue to monitor. Pt is sleeping. SR noted on cardiac monitor.

## 2023-03-26 NOTE — Assessment & Plan Note (Signed)
Stable. Pt will need to take OTC potassium supplements at home

## 2023-07-26 ENCOUNTER — Inpatient Hospital Stay (HOSPITAL_COMMUNITY)

## 2023-07-26 ENCOUNTER — Emergency Department (HOSPITAL_COMMUNITY)

## 2023-07-26 ENCOUNTER — Other Ambulatory Visit: Payer: Self-pay

## 2023-07-26 ENCOUNTER — Inpatient Hospital Stay (HOSPITAL_COMMUNITY)
Admission: EM | Admit: 2023-07-26 | Discharge: 2023-07-29 | DRG: 690 | Disposition: A | Discharge status: Home Health | Attending: Student | Admitting: Student

## 2023-07-26 ENCOUNTER — Encounter (HOSPITAL_COMMUNITY): Payer: Self-pay

## 2023-07-26 DIAGNOSIS — N3000 Acute cystitis without hematuria: Secondary | ICD-10-CM | POA: Diagnosis not present

## 2023-07-26 DIAGNOSIS — Z7984 Long term (current) use of oral hypoglycemic drugs: Secondary | ICD-10-CM | POA: Diagnosis not present

## 2023-07-26 DIAGNOSIS — Z79899 Other long term (current) drug therapy: Secondary | ICD-10-CM | POA: Diagnosis not present

## 2023-07-26 DIAGNOSIS — R29898 Other symptoms and signs involving the musculoskeletal system: Secondary | ICD-10-CM

## 2023-07-26 DIAGNOSIS — Z1152 Encounter for screening for COVID-19: Secondary | ICD-10-CM | POA: Diagnosis not present

## 2023-07-26 DIAGNOSIS — H538 Other visual disturbances: Secondary | ICD-10-CM | POA: Diagnosis present

## 2023-07-26 DIAGNOSIS — E119 Type 2 diabetes mellitus without complications: Secondary | ICD-10-CM

## 2023-07-26 DIAGNOSIS — Z8249 Family history of ischemic heart disease and other diseases of the circulatory system: Secondary | ICD-10-CM | POA: Diagnosis not present

## 2023-07-26 DIAGNOSIS — E782 Mixed hyperlipidemia: Secondary | ICD-10-CM | POA: Diagnosis present

## 2023-07-26 DIAGNOSIS — R7401 Elevation of levels of liver transaminase levels: Secondary | ICD-10-CM | POA: Diagnosis present

## 2023-07-26 DIAGNOSIS — E872 Acidosis, unspecified: Secondary | ICD-10-CM | POA: Diagnosis present

## 2023-07-26 DIAGNOSIS — N400 Enlarged prostate without lower urinary tract symptoms: Secondary | ICD-10-CM | POA: Diagnosis present

## 2023-07-26 DIAGNOSIS — N390 Urinary tract infection, site not specified: Principal | ICD-10-CM | POA: Diagnosis present

## 2023-07-26 DIAGNOSIS — R509 Fever, unspecified: Principal | ICD-10-CM

## 2023-07-26 DIAGNOSIS — G35 Multiple sclerosis: Secondary | ICD-10-CM | POA: Diagnosis present

## 2023-07-26 DIAGNOSIS — M109 Gout, unspecified: Secondary | ICD-10-CM | POA: Diagnosis present

## 2023-07-26 LAB — I-STAT CHEM 8, ED
BUN: 12 mg/dL (ref 8–23)
Calcium, Ion: 1.1 mmol/L — ABNORMAL LOW (ref 1.15–1.40)
Chloride: 101 mmol/L (ref 98–111)
Creatinine, Ser: 1.2 mg/dL (ref 0.61–1.24)
Glucose, Bld: 113 mg/dL — ABNORMAL HIGH (ref 70–99)
HCT: 49 % (ref 39.0–52.0)
Hemoglobin: 16.7 g/dL (ref 13.0–17.0)
Potassium: 3.5 mmol/L (ref 3.5–5.1)
Sodium: 142 mmol/L (ref 135–145)
TCO2: 31 mmol/L (ref 22–32)

## 2023-07-26 LAB — URINALYSIS, W/ REFLEX TO CULTURE (INFECTION SUSPECTED)
Bacteria, UA: NONE SEEN
Bilirubin Urine: NEGATIVE
Glucose, UA: NEGATIVE mg/dL
Hgb urine dipstick: NEGATIVE
Ketones, ur: NEGATIVE mg/dL
Leukocytes,Ua: NEGATIVE
Nitrite: NEGATIVE
Protein, ur: NEGATIVE mg/dL
Specific Gravity, Urine: 1.04 — ABNORMAL HIGH (ref 1.005–1.030)
pH: 5 (ref 5.0–8.0)

## 2023-07-26 LAB — CBC
HCT: 47.4 % (ref 39.0–52.0)
HCT: 41.9 % (ref 39.0–52.0)
Hemoglobin: 16.5 g/dL (ref 13.0–17.0)
Hemoglobin: 14.4 g/dL (ref 13.0–17.0)
MCH: 33.1 pg (ref 26.0–34.0)
MCH: 32.6 pg (ref 26.0–34.0)
MCHC: 34.8 g/dL (ref 30.0–36.0)
MCHC: 34.4 g/dL (ref 30.0–36.0)
MCV: 95.2 fL (ref 80.0–100.0)
MCV: 94.8 fL (ref 80.0–100.0)
Platelets: 164 10*3/uL (ref 150–400)
Platelets: 141 10*3/uL — ABNORMAL LOW (ref 150–400)
RBC: 4.98 MIL/uL (ref 4.22–5.81)
RBC: 4.42 MIL/uL (ref 4.22–5.81)
RDW: 13.8 % (ref 11.5–15.5)
RDW: 13.7 % (ref 11.5–15.5)
WBC: 10.7 10*3/uL — ABNORMAL HIGH (ref 4.0–10.5)
WBC: 8.5 10*3/uL (ref 4.0–10.5)
nRBC: 0 % (ref 0.0–0.2)
nRBC: 0 % (ref 0.0–0.2)

## 2023-07-26 LAB — COMPREHENSIVE METABOLIC PANEL
ALT: 96 U/L — ABNORMAL HIGH (ref 0–44)
AST: 73 U/L — ABNORMAL HIGH (ref 15–41)
Albumin: 4.1 g/dL (ref 3.5–5.0)
Alkaline Phosphatase: 97 U/L (ref 38–126)
Anion gap: 13 (ref 5–15)
BUN: 10 mg/dL (ref 8–23)
CO2: 28 mmol/L (ref 22–32)
Calcium: 9 mg/dL (ref 8.9–10.3)
Chloride: 100 mmol/L (ref 98–111)
Creatinine, Ser: 1.18 mg/dL (ref 0.61–1.24)
GFR, Estimated: >60 mL/min (ref >60)
Glucose, Bld: 119 mg/dL — ABNORMAL HIGH (ref 70–99)
Potassium: 3.6 mmol/L (ref 3.5–5.1)
Sodium: 141 mmol/L (ref 135–145)
Total Bilirubin: 1.8 mg/dL — ABNORMAL HIGH (ref 0.0–1.2)
Total Protein: 7.2 g/dL (ref 6.5–8.1)

## 2023-07-26 LAB — RESP PANEL BY RT-PCR (RSV, FLU A&B, COVID)  RVPGX2
Influenza A by PCR: NEGATIVE
Influenza B by PCR: NEGATIVE
Resp Syncytial Virus by PCR: NEGATIVE
SARS Coronavirus 2 by RT PCR: NEGATIVE

## 2023-07-26 LAB — PROTIME-INR
INR: 1 (ref 0.8–1.2)
Prothrombin Time: 13.3 s (ref 11.4–15.2)

## 2023-07-26 LAB — HEMOGLOBIN A1C
Hgb A1c MFr Bld: 5.9 % — ABNORMAL HIGH (ref 4.8–5.6)
Mean Plasma Glucose: 122.63 mg/dL

## 2023-07-26 LAB — I-STAT CG4 LACTIC ACID, ED: Lactic Acid, Venous: 2.2 mmol/L (ref 0.5–1.9)

## 2023-07-26 LAB — BRAIN NATRIURETIC PEPTIDE: B Natriuretic Peptide: 30.1 pg/mL (ref 0.0–100.0)

## 2023-07-26 LAB — GLUCOSE, CAPILLARY: Glucose-Capillary: 109 mg/dL — ABNORMAL HIGH (ref 70–99)

## 2023-07-26 LAB — APTT: aPTT: 29 s (ref 24–36)

## 2023-07-26 LAB — C-REACTIVE PROTEIN: CRP: 1 mg/dL — ABNORMAL HIGH (ref <1.0)

## 2023-07-26 MED ORDER — ONDANSETRON HCL 4 MG/2ML IJ SOLN: 4.0000 mg | Freq: Four times a day (QID) | INTRAMUSCULAR | Status: DC | PRN

## 2023-07-26 MED ORDER — ACETAMINOPHEN 650 MG RE SUPP: 650.0000 mg | Freq: Four times a day (QID) | RECTAL | Status: DC | PRN

## 2023-07-26 MED ORDER — ACETAMINOPHEN 325 MG PO TABS
325.0000 mg | ORAL_TABLET | Freq: Once | ORAL | Status: AC
  Administered 2023-07-26: 325 mg via ORAL
  Filled 2023-07-26: qty 1

## 2023-07-26 MED ORDER — MELATONIN 3 MG PO TABS: 3.0000 mg | ORAL_TABLET | Freq: Every evening | ORAL | Status: DC | PRN

## 2023-07-26 MED ORDER — GADOBUTROL 1 MMOL/ML IV SOLN
10.0000 mL | Freq: Once | INTRAVENOUS | Status: AC | PRN
  Administered 2023-07-26: 10 mL via INTRAVENOUS

## 2023-07-26 MED ORDER — LACTATED RINGERS IV SOLN: INTRAVENOUS | Status: DC

## 2023-07-26 MED ORDER — SODIUM CHLORIDE 0.9 % IV SOLN
1.0000 g | INTRAVENOUS | Status: DC
  Administered 2023-07-26 – 2023-07-28 (×3): 1 g via INTRAVENOUS
  Filled 2023-07-26 (×3): qty 10

## 2023-07-26 MED ORDER — BISACODYL 5 MG PO TBEC: 5.0000 mg | DELAYED_RELEASE_TABLET | Freq: Every day | ORAL | Status: DC | PRN

## 2023-07-26 MED ORDER — ACETAMINOPHEN 325 MG PO TABS: 650.0000 mg | ORAL_TABLET | Freq: Four times a day (QID) | ORAL | Status: DC | PRN

## 2023-07-26 MED ORDER — SODIUM CHLORIDE 0.9 % IV BOLUS
1000.0000 mL | Freq: Once | INTRAVENOUS | Status: AC
  Administered 2023-07-26: 1000 mL via INTRAVENOUS

## 2023-07-26 MED ORDER — LORAZEPAM 2 MG/ML IJ SOLN: 1.0000 mg | Freq: Once | INTRAMUSCULAR | Status: DC | PRN

## 2023-07-26 MED ORDER — ONDANSETRON HCL 4 MG PO TABS: 4.0000 mg | ORAL_TABLET | Freq: Four times a day (QID) | ORAL | Status: DC | PRN

## 2023-07-26 MED ORDER — AMLODIPINE BESYLATE 5 MG PO TABS: 10.0000 mg | ORAL_TABLET | Freq: Every day | ORAL | Status: DC

## 2023-07-26 MED ORDER — HEPARIN SODIUM (PORCINE) 5000 UNIT/ML IJ SOLN
5000.0000 [IU] | Freq: Three times a day (TID) | INTRAMUSCULAR | Status: DC
  Administered 2023-07-26 – 2023-07-28 (×7): 5000 [IU] via SUBCUTANEOUS
  Filled 2023-07-26 (×7): qty 1

## 2023-07-26 MED ORDER — IOHEXOL 350 MG/ML SOLN
75.0000 mL | Freq: Once | INTRAVENOUS | Status: AC | PRN
  Administered 2023-07-26: 75 mL via INTRAVENOUS

## 2023-07-26 MED ORDER — AMLODIPINE BESYLATE 5 MG PO TABS
10.0000 mg | ORAL_TABLET | Freq: Every day | ORAL | Status: DC
  Administered 2023-07-27: 10 mg via ORAL
  Filled 2023-07-26: qty 2

## 2023-07-26 MED ORDER — MIRTAZAPINE 15 MG PO TABS
15.0000 mg | ORAL_TABLET | Freq: Every day | ORAL | Status: DC
  Administered 2023-07-26 – 2023-07-28 (×3): 15 mg via ORAL
  Filled 2023-07-26 (×3): qty 1

## 2023-07-26 MED ORDER — INSULIN ASPART 100 UNIT/ML IJ SOLN
0.0000 [IU] | Freq: Three times a day (TID) | INTRAMUSCULAR | Status: DC
  Administered 2023-07-27 (×2): 1 [IU] via SUBCUTANEOUS

## 2023-07-26 MED ORDER — FINASTERIDE 5 MG PO TABS
5.0000 mg | ORAL_TABLET | Freq: Every day | ORAL | Status: DC
  Administered 2023-07-26 – 2023-07-29 (×4): 5 mg via ORAL
  Filled 2023-07-26 (×4): qty 1

## 2023-07-26 MED ORDER — VANCOMYCIN HCL IN DEXTROSE 1-5 GM/200ML-% IV SOLN: 1000.0000 mg | Freq: Once | INTRAVENOUS | Status: DC

## 2023-07-26 MED ORDER — TAMSULOSIN HCL 0.4 MG PO CAPS
0.8000 mg | ORAL_CAPSULE | Freq: Every day | ORAL | Status: DC
  Administered 2023-07-26 – 2023-07-28 (×3): 0.8 mg via ORAL
  Filled 2023-07-26 (×3): qty 2

## 2023-07-26 MED ORDER — VANCOMYCIN HCL 2000 MG/400ML IV SOLN
2000.0000 mg | Freq: Once | INTRAVENOUS | Status: AC
  Administered 2023-07-26: 2000 mg via INTRAVENOUS
  Filled 2023-07-26: qty 400

## 2023-07-26 MED ORDER — POTASSIUM CHLORIDE CRYS ER 20 MEQ PO TBCR
40.0000 meq | EXTENDED_RELEASE_TABLET | Freq: Once | ORAL | Status: AC
  Administered 2023-07-26: 40 meq via ORAL
  Filled 2023-07-26: qty 2

## 2023-07-26 MED ORDER — POLYETHYLENE GLYCOL 3350 17 G PO PACK
17.0000 g | PACK | Freq: Two times a day (BID) | ORAL | Status: DC
  Administered 2023-07-26 – 2023-07-28 (×4): 17 g via ORAL
  Filled 2023-07-26 (×5): qty 1

## 2023-07-26 MED ORDER — PIPERACILLIN-TAZOBACTAM 3.375 G IVPB 30 MIN
3.3750 g | Freq: Once | INTRAVENOUS | Status: AC
  Administered 2023-07-26: 3.375 g via INTRAVENOUS
  Filled 2023-07-26: qty 50

## 2023-07-26 MED ORDER — ATORVASTATIN CALCIUM 80 MG PO TABS
80.0000 mg | ORAL_TABLET | Freq: Every day | ORAL | Status: DC
  Administered 2023-07-26 – 2023-07-28 (×3): 80 mg via ORAL
  Filled 2023-07-26 (×2): qty 1
  Filled 2023-07-26: qty 2
  Filled 2023-07-26 (×3): qty 1

## 2023-07-26 MED ORDER — INSULIN ASPART 100 UNIT/ML IJ SOLN: 0.0000 [IU] | Freq: Every day | INTRAMUSCULAR | Status: DC

## 2023-07-26 MED ORDER — ALLOPURINOL 100 MG PO TABS
300.0000 mg | ORAL_TABLET | Freq: Every day | ORAL | Status: DC
  Administered 2023-07-26 – 2023-07-29 (×4): 300 mg via ORAL
  Filled 2023-07-26 (×4): qty 3

## 2023-07-26 NOTE — ED Notes (Signed)
 Pt is a difficult stick..Only able to get one set of BC prior to start of ABX.

## 2023-07-26 NOTE — Progress Notes (Signed)
 Microbiology updated that there is an order for Respiratory panel 20 pathogen test, she said that she will try to add it on earlier collected specimen.

## 2023-07-26 NOTE — ED Provider Notes (Signed)
 [place] EMERGENCY DEPARTMENT AT [facility] Provider Note   CSN: [id number] Arrival date & time: 07/26/23  2130     History  Chief Complaint  Patient presents with   Fever   Extremity Weakness   Emesis    James Gibbs is a 68 y.o. male with MS who presents with right lower extremity weakness.  Patient states that he typically has to drag his right lower extremity.  However this morning when he woke at 3:30 AM he felt that he had dragged his foot more.  Denies any numbness or tingling to his lower extremity.  Denies any pain.   Patient was recently admitted on 03/25/2023 with similar complaints.  MRI brain was negative for acute CVA, MRI brain showed no acute abnormality, MRI lumbar spine showed right extraforaminal disc protrusion at L3-L4 with multilevel degenerative changes.  Patient was evaluated by physical therapy at that time.  Felt that no acute PT was needed.  Patient was discharged home to follow-up with his outpatient neurologist and PCP.  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 Authorizing Provider  acetaminophen (TYLENOL) 325 MG tablet Take 650 mg by mouth every 6 (six) hours as needed for mild pain (pain score 1-3) or headache.    [provider]  allopurinol (ZYLOPRIM) 300 MG tablet Take 300 mg by mouth daily.    [provider]  amLODipine (NORVASC) 10 MG tablet Take 10 mg by mouth daily.    [provider]  Ascorbic Acid (VITAMIN C) 100 MG tablet Take 500 mg by mouth in the morning and at bedtime.    [provider]  atorvastatin (LIPITOR) 80 MG tablet Take 80 mg by mouth at bedtime.    [provider]   dalfampridine 10 MG TB12 Take 1 tablet by mouth every 12 (twelve) hours.    [provider]  Dimethyl Fumarate (TECFIDERA) 240 MG CPDR Take 240 mg by mouth 2 (two) times daily.    [provider]  finasteride (PROSCAR) 5 MG tablet Take 5 mg by mouth daily.    [provider]  hydrochlorothiazide (HYDRODIURIL) 25 MG tablet Take 25 mg by mouth daily.    [provider]  lisinopril (PRINIVIL,ZESTRIL) 40 MG tablet Take 40 mg by mouth daily.    [provider]  melatonin 3 MG TABS tablet Take 1 tablet (3 mg total) by mouth at bedtime as needed (insomnia). 09/19/22   Love, Evlyn Kanner, PA-C  metFORMIN (GLUCOPHAGE-XR) 500 MG 24 hr tablet Take 500 mg by mouth daily. 05/29/22   [provider]  mirtazapine (REMERON) 15 MG tablet Take 15 mg by mouth at bedtime. May repeat with a quarter of a tablet if you wake up - up to twice a night) 06/16/22   [provider]  Multiple Vitamin (MULTIVITAMIN WITH MINERALS) TABS tablet Take 1 tablet by mouth daily.    [provider]  Omega-3 Fatty Acids (FISH OIL) 1000 MG CAPS Take 1,000 mg by mouth daily.    [provider]  polyethylene glycol (MIRALAX / GLYCOLAX) 17 g packet Take 17 g by mouth 2 (two) times daily.  09/19/22   Love, Evlyn Kanner, PA-C  polyvinyl alcohol (LIQUIFILM TEARS) 1.4 % ophthalmic solution Place 1 drop into both eyes 4 (four) times daily. 09/15/22   Love, Evlyn Kanner, PA-C  potassium chloride (KLOR-CON M) 10 MEQ tablet Take 1 tablet (10 mEq total) by mouth daily. 09/20/22   Love, Evlyn Kanner, PA-C  senna-docusate (SENOKOT-S) 8.6-50 MG tablet Take 1 tablet by mouth at bedtime. 09/19/22   Love, Evlyn Kanner, PA-C  tamsulosin (FLOMAX) 0.4 MG CAPS capsule Take 2 capsules (0.8 mg total) by mouth daily after supper. 09/15/22   Love, Evlyn Kanner, PA-C  zinc sulfate 220 (50 Zn) MG capsule Take 1 capsule (220 mg total) by mouth daily after supper. 09/19/22   Jacquelynn Cree, PA-C      Allergies    Patient  has no known allergies.    Review of Systems   Review of Systems  Neurological:  Positive for weakness.     Physical Exam Updated Vital Signs BP 124/82   Pulse 92   Temp (!) 100.5 F (38.1 C) (Oral)   Resp (!) 21   SpO2 98%  Physical Exam Vitals and nursing note reviewed.  Constitutional:      General: He is not in acute distress.    Appearance: He is well-developed.  HENT:     Head: Normocephalic and atraumatic.  Eyes:     Conjunctiva/sclera: Conjunctivae normal.  Cardiovascular:     Rate and Rhythm: Normal rate and regular rhythm.     Heart sounds: No murmur heard. Pulmonary:     Effort: Pulmonary effort is normal. No respiratory distress.     Breath sounds: Normal breath sounds.  Abdominal:     Palpations: Abdomen is soft.     Tenderness: There is no abdominal tenderness.  Musculoskeletal:        General: No swelling.     Cervical back: Neck supple.  Skin:    General: Skin is warm and dry.     Capillary Refill: Capillary refill takes less than 2 seconds.     Comments: No obvious wounds in perineum, scrotal area or backside  Neurological:     Mental Status: He is alert.     Comments: Patient is alert and oriented. There is no abnormal phonation. Symmetric smile without facial droop. No pronator drift. Moves all extremities spontaneously.  Upper extremities with 5 out of 5 strength bilaterally, 3 out of 5 right lower extremity strength, 5 out of 5 on the left.  Right patellar reflex appears to be slightly hyporeflexive.  No sensation deficit. There is no nystagmus. EOMI, PERRL. Coordination intact with finger to nose   Psychiatric:        Mood and Affect: Mood normal.     ED Results / Procedures / Treatments   Labs (all labs ordered are listed, but only abnormal results are displayed) Labs Reviewed  COMPREHENSIVE METABOLIC PANEL - Abnormal; Notable for the following components:      Result Value   Glucose, Bld 119 (*)    AST 73 (*)    ALT 96 (*)    Total  Bilirubin 1.8 (*)    All other components within normal limits  CBC - Abnormal; Notable for the following components:   WBC 10.7 (*)    All other components within normal limits  I-STAT CHEM 8, ED - Abnormal; Notable for the following components:   Glucose, Bld 113 (*)    Calcium, Ion 1.10 (*)    All other components  within normal limits  I-STAT CG4 LACTIC ACID, ED - Abnormal; Notable for the following components:   Lactic Acid, Venous 2.2 (*)    All other components within normal limits  RESP PANEL BY RT-PCR (RSV, FLU A&B, COVID)  RVPGX2  CULTURE, BLOOD (ROUTINE X 2)  CULTURE, BLOOD (ROUTINE X 2)  PROTIME-INR  APTT  URINALYSIS, W/ REFLEX TO CULTURE (INFECTION SUSPECTED)  I-STAT CG4 LACTIC ACID, ED    EKG EKG Interpretation Date/Time:  Sunday July 26 2023 10:35:25 EST Ventricular Rate:  94 PR Interval:  236 QRS Duration:  83 QT Interval:  331 QTC Calculation: 414 R Axis:   34  Text Interpretation: Sinus rhythm Prolonged PR interval Probable left atrial enlargement Borderline T abnormalities, lateral leads similar to Oct 2024 Confirmed by Pricilla Loveless [id number]) on 07/26/2023 11:22:54 AM  Radiology DG Chest 2 View Result Date: 07/26/2023 CLINICAL DATA:  Fever. EXAM: CHEST - 2 VIEW COMPARISON:  Radiographs 09/03/2022 and 01/29/2022. FINDINGS: Lordotic positioning on the frontal examination. Mild motion artifact on the lateral view. The heart size and mediastinal contours are stable. The lungs appear clear. No pleural effusion or pneumothorax. No acute osseous findings are evident. IMPRESSION: No evidence of acute cardiopulmonary process. Electronically Signed   By: Carey Bullocks M.D.   On: 07/26/2023 11:58   CT Head Wo Contrast Result Date: 07/26/2023 CLINICAL DATA:  Weakness in the right leg.  Nausea and vomiting. EXAM: CT HEAD WITHOUT CONTRAST TECHNIQUE: Contiguous axial images were obtained from the base of the skull through the vertex without intravenous contrast. RADIATION  DOSE REDUCTION: This exam was performed according to the departmental dose-optimization program which includes automated exposure control, adjustment of the mA and/or kV according to patient size and/or use of iterative reconstruction technique. COMPARISON:  Head CT dated 03/25/2023. FINDINGS: Brain: No evidence of acute infarction, hemorrhage, hydrocephalus, extra-axial collection or mass lesion/mass effect. There is mild cerebral volume loss with associated ex vacuo dilatation. Periventricular white matter hypoattenuation likely represents chronic small vessel ischemic disease. A few scattered bilateral cerebral white matter hypodensities likely reflect changes from chronic demyelinating disease/multiple sclerosis. Vascular: No hyperdense vessel or unexpected calcification. Skull: Normal. Negative for fracture or focal lesion. Sinuses/Orbits: There is right maxillary and left ethmoid sinus disease. Other: None. IMPRESSION: 1. No acute intracranial process. Electronically Signed   By: Romona Curls M.D.   On: 07/26/2023 11:46    Procedures Procedures    Medications Ordered in ED Medications  vancomycin (VANCOREADY) IVPB 2000 mg/400 mL (2,000 mg Intravenous New Bag/Given 07/26/23 1428)  acetaminophen (TYLENOL) tablet 325 mg (325 mg Oral Given 07/26/23 1306)  sodium chloride 0.9 % bolus 1,000 mL (0 mLs Intravenous Stopped 07/26/23 1506)  piperacillin-tazobactam (ZOSYN) IVPB 3.375 g (0 g Intravenous Stopped 07/26/23 1415)  iohexol (OMNIPAQUE) 350 MG/ML injection 75 mL (75 mLs Intravenous Contrast Given 07/26/23 1541)    ED Course/ Medical Decision Making/ A&P Clinical Course as of 07/26/23 1549  Sun Jul 26, 2023  1535 Hx of MS h/f feeling poorly, worsened RLE weakness from baseline. Febrile and tachy on arrival. Septic workup initiated. Started on abx. Liver enzymes mildly elevated. CTH reassuinrg. CXR no PNA. [ ]  CT ab/pel [RK]    Clinical Course User Index [RK] Caron Presume, MD                                  Medical Decision Making Amount and/or Complexity of Data Reviewed  Labs: ordered. Radiology: ordered.  Risk OTC drugs. Prescription drug management.   This patient presents to the ED with chief complaint(s) of fever and weakness.  The complaint involves an extensive differential diagnosis and also carries with it a high risk of complications and morbidity.   pertinent past medical history as listed in HPI  The differential diagnosis includes  Sepsis, URI, pneumonia, UTI, cholecystitis, appendicitis, diverticulitis, CVA, TIA, subarachnoid hemorrhage, MS, fracture, strain, septic joint  Additional history obtained: Records reviewed previous admission documents and Care Everywhere/External Records  Initial Assessment:   Patient presents hypertensive to 150/84 febrile to 100.6 and tachycardic to 106.  He primarily complains of worsening weakness to right lower extremity that started this morning at 3:30 AM when he woke up.  He lives alone.  He has known right lower extremity weakness presumably secondary to MS.  Recent admission in 10/24 with unremarkable workup.  He admits to bilateral blurry vision that started this morning as well.  Otherwise he is without any complaints.  Denies any cough or congestion.  His lungs sound clear.  He is not tachypneic or hypoxic.  Denies any abdominal pain.  His abdomen is nontender without peritoneal signs.  Denies any dysuria or increased frequency.  His right lower extremity is without any point of tenderness or swelling.  There is no overlying erythema or warmth.  No suspicion for right lower extremity infectious process.   Independent ECG interpretation:  Sinus rhythm, prolonged QT interval, borderline T wave abnormality in lateral leads  Independent labs interpretation:  The following labs were independently interpreted:  CBC with mild leukocytosis of 10.7, CMP with mildly elevated AST and ALT of 73 and 96 respectively, lactic acid of 2.2,  Chem-8 without significant findings  Independent visualization and interpretation of imaging: I independently visualized the following imaging with scope of interpretation limited to determining acute life threatening conditions related to emergency care: Chest x-ray, which revealed no evidence of acute cardio pulmonary disease CT head without acute findings Treatment and Reassessment: Patient started on empiric antibiotic coverage for sepsis of unknown source  Consultations obtained:   None at this time  Disposition:   Signout given to Dr. Craige Cotta.  Disposition pending workup.  At this time blood patient remains febrile to 100.5 but otherwise hemodynamically stable.  Social Determinants of Health:   None  This note was dictated with voice recognition software.  Despite best efforts at proofreading, errors may have occurred which can change the documentation meaning.          Final Clinical Impression(s) / ED Diagnoses Final diagnoses:  Fever, unspecified fever cause  Right leg weakness    Rx / DC Orders ED Discharge Orders     None         Halford Decamp, PA-C 07/26/23 1549    Pricilla Loveless, MD 07/28/23 [phone]

## 2023-07-26 NOTE — ED Provider Notes (Signed)
 Assume Care Medical Decision Making  Patient care of assumed from previous provider at change of shift. Please see the original provider note from this emergency department encounter for full history and physical.   Briefly, James Gibbs is a 68 y.o. male who presents with:  Clinical Course as of 07/26/23 1705  Sun Jul 26, 2023  1535 Hx of MS h/f feeling poorly, worsened RLE weakness from baseline. Febrile and tachy on arrival. Septic workup initiated. Started on abx. Liver enzymes mildly elevated. CTH reassuinrg. CXR no PNA. [ ]  CT ab/pel [RK]    Clinical Course User Index [RK] Caron Presume, MD     Please refer to the original provider's note for additional information regarding the care of James Gibbs.  Labs reviewed by myself and considered in medical decision making.  Imaging reviewed by myself and considered in medical decision making. Imaging final read interpreted by radiology.  Additional MDM/ED Course: The patient with multiple sclerosis presenting for worsening right lower extremity pain and inability ambulate. Does have a history of right extremity weakness, but worse over the past few days Febrile and tachycardic.   Sepsis workup was initiated, which was eventually unremarkable.  Respiratory gastrorenal negative, lactic acid mildly elevated 2.2.  Mild leukocytosis to 10.7.  Urinalysis noninfectious  Given acute illness, concern for possible recrudescence of multiple sclerosis versus MS flare.  Plan to consult neurology and admit to hospitalist for further management  Disposition:  I discussed the case with hospitalist who graciously agreed to admit the patient to their service for continued care.   Clinical Impression:  1. Fever, unspecified fever cause   2. Right leg weakness     Rx / DC Orders ED Discharge Orders     None       The plan for this patient was discussed with Dr. Rhunette Croft, who voiced agreement and who oversaw evaluation and  treatment of this patient.    Caron Presume, MD 07/26/23 1705    Derwood Kaplan, MD 07/27/23 [phone]

## 2023-07-26 NOTE — ED Notes (Signed)
 Pt in ct scan

## 2023-07-26 NOTE — ED Triage Notes (Signed)
 Pt arrives via EMS from home. Pt reports waking up at 0330 this morning with weakness in his right leg. Pt also had an episode of nausea and vomiting with EMS. Pt arrives AxOx4. Hx of MS, daughter was concerned he is having a flair up.

## 2023-07-26 NOTE — Sepsis Progress Note (Signed)
 Elink following code sepsis

## 2023-07-26 NOTE — Progress Notes (Signed)
 ED Pharmacy Antibiotic Sign Off An antibiotic consult was received from an ED provider for vancomycin and zosyn per pharmacy dosing for sepsis. A chart review was completed to assess appropriateness.  A single dose of zosyn placed by the ED provider.   The following one time order(s) were placed per pharmacy consult:  vancomycin 2000 mg x 1 dose  Further antibiotic and/or antibiotic pharmacy consults should be ordered by the admitting provider if indicated.   Thank you for allowing pharmacy to be a part of this patient's care.   Delmar Landau, PharmD, BCPS 07/26/2023 12:37 PM ED Clinical Pharmacist -  [phone]

## 2023-07-26 NOTE — ED Notes (Signed)
 At bedside with Caron Presume, MD. Pt asked by Dr. Craige Cotta to lift left leg and patient was only able to lift his leg for 2 seconds before it dropped down to the bed. Dr. Rhunette Croft made aware

## 2023-07-26 NOTE — ED Notes (Signed)
 Phlebotomy to stick for labs.

## 2023-07-26 NOTE — H&P (Signed)
 [facility] H&P   Patient Demographics:    James Gibbs, is a 68 y.o. male  MRN: [id number]   DOB - [birth year]  Admit Date - 07/26/2023  Outpatient Primary MD for the patient is Tapp, Karen Chafe, MD  Outpatient Specialists:   [facility]  Patient coming from: Home  Chief Complaint  Patient presents with   Fever   Extremity Weakness   Emesis      HPI:    James Gibbs  is a 68 y.o. male, with known history of multiple sclerosis for several years gets his treatment at [facility] on [employer], chronic right-sided weakness leg more than arm due to underlying MS, hypertension, dyslipidemia, LVH, DM type II who lives by himself at home as a caregiver who visits him several times a week.     According to the patient he was doing well however yesterday night he noted some night sweats and a dry cough along with some runny nose, this morning he had dysuria while he peed, subsequently he started running low-grade fevers along with generalized weakness and worsening of his right lower extremity weakness, he had problems walking around with a walker which he usually uses subsequently presented to the ER where initial workup was unremarkable including blood work, UA was questionable for UTI, CT abdomen pelvis unremarkable, neurology was consulted and it was thought that patient could have occult infection related MS flare and we were requested to admit the patient.   Currently in the ER besides generalized weakness worse in the right lower extremity, mild runny nose and a dry cough he is otherwise stable, denies any headache, no chest or abdominal pain, no shortness of breath, no blood in stool or urine, no exposure to known  sick contacts, he does have a caregiver who comes to his house who has 2 young kids but he does not know if the caregiver is sick.  He has no other subjective complaints.    Review of systems:    A full 10 point Review of Systems was done, except as stated above, all other Review of Systems were negative.   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    Malignant hypertension  Mitral valve anterior leaflet prolapse    Mixed hyperlipidemia    Multiple sclerosis (HCC)       Past Surgical History:  Procedure Laterality Date   COLONOSCOPY WITH PROPOFOL N/A 12/17/2018   Procedure: COLONOSCOPY WITH PROPOFOL;  Surgeon: Bernette Redbird, MD;  Location: [facility];  Service: Endoscopy;  Laterality: N/A;   ESOPHAGOGASTRODUODENOSCOPY (EGD) WITH PROPOFOL N/A 12/14/2018   Procedure: ESOPHAGOGASTRODUODENOSCOPY (EGD) MODERATE SEDATION;  Surgeon: Bernette Redbird, MD;  Location: [facility];  Service: Endoscopy;  Laterality: N/A;   GIVENS CAPSULE STUDY N/A 12/18/2018   Procedure: GIVENS CAPSULE STUDY;  Surgeon: Willis Modena, MD;  Location: [facility];  Service: Endoscopy;  Laterality: N/A;   none        Social History:     Social History   Tobacco Use   Smoking status: Never   Smokeless tobacco: Never  Substance Use Topics   Alcohol use: No         Family History :     Family History  Problem Relation Age of Onset   Hypertension Mother    Hypertension Father        Home Medications:   Prior to Admission medications   Medication Sig Start Date End Date Taking? Authorizing Provider  acetaminophen (TYLENOL) 325 MG tablet Take 650 mg by mouth every 6 (six) hours as needed for mild pain (pain score 1-3) or headache.    [provider]  allopurinol (ZYLOPRIM) 300 MG tablet Take 300 mg by mouth daily.    [provider]  amLODipine (NORVASC) 10  MG tablet Take 10 mg by mouth daily.    [provider]  Ascorbic Acid (VITAMIN C) 100 MG tablet Take 500 mg by mouth in the morning and at bedtime.    [provider]  atorvastatin (LIPITOR) 80 MG tablet Take 80 mg by mouth at bedtime.    [provider]  dalfampridine 10 MG TB12 Take 1 tablet by mouth every 12 (twelve) hours.    [provider]  Dimethyl Fumarate (TECFIDERA) 240 MG CPDR Take 240 mg by mouth 2 (two) times daily.    [provider]  finasteride (PROSCAR) 5 MG tablet Take 5 mg by mouth daily.    [provider]  hydrochlorothiazide (HYDRODIURIL) 25 MG tablet Take 25 mg by mouth daily.    [provider]  lisinopril (PRINIVIL,ZESTRIL) 40 MG tablet Take 40 mg by mouth daily.    [provider]  melatonin 3 MG TABS tablet Take 1 tablet (3 mg total) by mouth at bedtime as needed (insomnia). 09/19/22   Love, Evlyn Kanner, PA-C  metFORMIN (GLUCOPHAGE-XR) 500 MG 24 hr tablet Take 500 mg by mouth daily. 05/29/22   [provider]  mirtazapine (REMERON) 15 MG tablet Take 15 mg by mouth at bedtime. May repeat with a quarter of a tablet if you wake up - up to twice a night) 06/16/22   [provider]  Multiple Vitamin (MULTIVITAMIN WITH MINERALS) TABS tablet Take 1 tablet by mouth daily.    [provider]  Omega-3 Fatty Acids (FISH OIL) 1000 MG CAPS Take 1,000 mg by mouth daily.    [provider]  polyethylene glycol (MIRALAX / GLYCOLAX) 17 g packet Take 17 g by mouth 2 (two) times daily. 09/19/22   Love, Evlyn Kanner, PA-C  polyvinyl alcohol (LIQUIFILM TEARS) 1.4 % ophthalmic solution Place 1 drop into both eyes 4 (four) times daily. 09/15/22   Love, Evlyn Kanner, PA-C  potassium chloride (  KLOR-CON M) 10 MEQ tablet Take 1 tablet (10 mEq total) by mouth daily. 09/20/22   Love, Evlyn Kanner, PA-C  senna-docusate (SENOKOT-S) 8.6-50 MG tablet Take 1 tablet by mouth at bedtime. 09/19/22   Love, Evlyn Kanner, PA-C   tamsulosin (FLOMAX) 0.4 MG CAPS capsule Take 2 capsules (0.8 mg total) by mouth daily after supper. 09/15/22   Love, Evlyn Kanner, PA-C  zinc sulfate 220 (50 Zn) MG capsule Take 1 capsule (220 mg total) by mouth daily after supper. 09/19/22   Love, Evlyn Kanner, PA-C     Allergies:    No Known Allergies   Physical Exam:   Vitals  Blood pressure 127/80, pulse 100, temperature 99.2 F (37.3 C), temperature source Oral, resp. rate (!) 22, SpO2 96%.   1. General Middle-aged African-American gentleman lying in hospital bed in no apparent discomfort,  2. Normal affect and insight, Not Suicidal or Homicidal, Awake Alert,   3. No F.N deficits, ALL C.Nerves Intact, Strength 5/5 all 4 extremities, Sensation intact all 4 extremities, Plantars down going.  Strength 5/5 in both upper extremities, 4/5 in left lower extremity and 2/5 in the right lower extremity  4. Ears and Eyes appear Normal, Conjunctivae clear, PERRLA. Moist Oral Mucosa.  5. Supple Neck, No JVD, No cervical lymphadenopathy appriciated, No Carotid Bruits.  6. Symmetrical Chest wall movement, Good air movement bilaterally, CTAB.  7. RRR, No Gallops, Rubs or Murmurs, No Parasternal Heave.  8. Positive Bowel Sounds, Abdomen Soft, No tenderness, No organomegaly appriciated,No rebound -guarding or rigidity.  9.  No Cyanosis, Normal Skin Turgor, No Skin Rash or Bruise.  10. Good muscle tone,  joints appear normal , no effusions, Normal ROM.  11. No Palpable Lymph Nodes in Neck or Axillae      Data Review:   Recent Labs  Lab 07/26/23 1028 07/26/23 1040  WBC 10.7*  --   HGB 16.5 16.7  HCT 47.4 49.0  PLT 164  --   MCV 95.2  --   MCH 33.1  --   MCHC 34.8  --   RDW 13.8  --     Recent Labs  Lab 07/26/23 1028 07/26/23 1040 07/26/23 1042  NA 141 142  --   K 3.6 3.5  --   CL 100 101  --   CO2 28  --   --   ANIONGAP 13  --   --   GLUCOSE 119* 113*  --   BUN 10 12  --   CREATININE 1.18 1.20  --   AST 73*  --   --    ALT 96*  --   --   ALKPHOS 97  --   --   BILITOT 1.8*  --   --   ALBUMIN 4.1  --   --   LATICACIDVEN  --   --  2.2*  INR 1.0  --   --   CALCIUM 9.0  --   --     No results found for: "CHOL", "HDL", "LDLCALC", "LDLDIRECT", "TRIG", "CHOLHDL"  Recent Labs  Lab 07/26/23 1028 07/26/23 1042  LATICACIDVEN  --  2.2*  INR 1.0  --   CALCIUM 9.0  --     Recent Labs  Lab 07/26/23 1028 07/26/23 1040 07/26/23 1042  WBC 10.7*  --   --   PLT 164  --   --   LATICACIDVEN  --   --  2.2*  CREATININE 1.18 1.20  --     Urinalysis  Component Value Date/Time   COLORURINE YELLOW 07/26/2023 1618   APPEARANCEUR CLEAR 07/26/2023 1618   LABSPEC 1.040 (H) 07/26/2023 1618   PHURINE 5.0 07/26/2023 1618   GLUCOSEU NEGATIVE 07/26/2023 1618   HGBUR NEGATIVE 07/26/2023 1618   BILIRUBINUR NEGATIVE 07/26/2023 1618   BILIRUBINUR neg 05/10/2013 2025   KETONESUR NEGATIVE 07/26/2023 1618   PROTEINUR NEGATIVE 07/26/2023 1618   UROBILINOGEN 0.2 05/10/2013 2025   NITRITE NEGATIVE 07/26/2023 1618   LEUKOCYTESUR NEGATIVE 07/26/2023 1618      Imaging Results:    CT ABDOMEN PELVIS W CONTRAST Result Date: 07/26/2023 CLINICAL DATA:  Sepsis nausea and vomiting. History of multiple sclerosis EXAM: CT ABDOMEN AND PELVIS WITH CONTRAST TECHNIQUE: Multidetector CT imaging of the abdomen and pelvis was performed using the standard protocol following bolus administration of intravenous contrast. RADIATION DOSE REDUCTION: This exam was performed according to the departmental dose-optimization program which includes automated exposure control, adjustment of the mA and/or kV according to patient size and/or use of iterative reconstruction technique. CONTRAST:  75mL OMNIPAQUE IOHEXOL 350 MG/ML SOLN COMPARISON:  12/20/2018 FINDINGS: Lower chest: Dependent bibasilar subsegmental atelectasis. Left hemidiaphragm elevation. Normal heart size with trace bilateral pleural fluid. Hepatobiliary: Normal liver. Normal gallbladder,  without biliary ductal dilatation. Pancreas: Normal, without mass or ductal dilatation. Spleen: Normal in size, without focal abnormality. Adrenals/Urinary Tract: Normal left adrenal gland. Minimal right adrenal nodularity. Left renal 2.6 cm interpolar cyst. Other bilateral renal lesions are too small to characterize but likely cysts . In the absence of clinically indicated signs/symptoms require(s) no independent follow-up. No hydronephrosis. Normal urinary bladder. Stomach/Bowel: The stomach is primarily underdistended, especially proximally. Normal colon, appendix, and terminal ileum. Normal small bowel. Vascular/Lymphatic: Aortic atherosclerosis. No abdominopelvic adenopathy. Reproductive: Mild prostatomegaly. Other: No significant free fluid. No free intraperitoneal air. Ventral abdominal wall hernia repair with mesh. Residual or recurrent small fat containing hernia above and right of the umbilicus, similar to 2020 (47/3). Musculoskeletal: Lumbosacral spondylosis. IMPRESSION: 1.  No acute process or explanation for sepsis. 2. Trace bilateral pleural fluid and bibasilar subsegmental atelectasis. 3. Prior ventral abdominal wall hernia repair with similar small fat containing right paramidline upper abdominal hernia. 4.  Aortic Atherosclerosis (ICD10-I70.0). Electronically Signed   By: Jeronimo Greaves M.D.   On: 07/26/2023 16:00   DG Chest 2 View Result Date: 07/26/2023 CLINICAL DATA:  Fever. EXAM: CHEST - 2 VIEW COMPARISON:  Radiographs 09/03/2022 and 01/29/2022. FINDINGS: Lordotic positioning on the frontal examination. Mild motion artifact on the lateral view. The heart size and mediastinal contours are stable. The lungs appear clear. No pleural effusion or pneumothorax. No acute osseous findings are evident. IMPRESSION: No evidence of acute cardiopulmonary process. Electronically Signed   By: Carey Bullocks M.D.   On: 07/26/2023 11:58   CT Head Wo Contrast Result Date: 07/26/2023 CLINICAL DATA:  Weakness in  the right leg.  Nausea and vomiting. EXAM: CT HEAD WITHOUT CONTRAST TECHNIQUE: Contiguous axial images were obtained from the base of the skull through the vertex without intravenous contrast. RADIATION DOSE REDUCTION: This exam was performed according to the departmental dose-optimization program which includes automated exposure control, adjustment of the mA and/or kV according to patient size and/or use of iterative reconstruction technique. COMPARISON:  Head CT dated 03/25/2023. FINDINGS: Brain: No evidence of acute infarction, hemorrhage, hydrocephalus, extra-axial collection or mass lesion/mass effect. There is mild cerebral volume loss with associated ex vacuo dilatation. Periventricular white matter hypoattenuation likely represents chronic small vessel ischemic disease. A few scattered bilateral cerebral white matter hypodensities  likely reflect changes from chronic demyelinating disease/multiple sclerosis. Vascular: No hyperdense vessel or unexpected calcification. Skull: Normal. Negative for fracture or focal lesion. Sinuses/Orbits: There is right maxillary and left ethmoid sinus disease. Other: None. IMPRESSION: 1. No acute intracranial process. Electronically Signed   By: Romona Curls M.D.   On: 07/26/2023 11:46    My personal review of EKG: Rhythm NSR,   no Acute ST changes   Assessment & Plan:    1.  Generalized weakness, worsening of chronic right lower extremity weakness, difficulty in ambulation in a patient with moderate to severe chronic MS who is on Tecfidera treatment at the [facility] clinic, likely a flare of his MS due to underlying URI/UTI.  Will be admitted to the hospital, will obtain COVID-19 testing along with extended respiratory viral panel, UA is borderline however he did have dysuria this morning and has generalized symptoms hence we will place him on empiric Rocephin, follow urine culture results, PT OT, gentle IV fluids and monitor.  Also obtain MRI brain, C and T-spine with  and without contrast to rule out worsening of his underlying MS as recommended by neuro.  2.  Hypertension.  Blood pressure mildly soft, gentle IV fluids resume Norvasc from tomorrow.  For now continue to hold ACE inhibitor and HCTZ.  3.  Dyslipidemia.  Home dose statin.  4.  Mild asymptomatic transaminitis.  Patient also taking some over-the-counter supplements, requested to abstain from taking them as they could be causing transaminitis, however a viral URI might also explain the symptoms.  For now we will check INR to look at liver synthetic function, repeat CMP in the morning, abdominal imaging reassuring.  5.  Gout.  Allopurinol.  6.  BPH.  Continue alpha-blockers.  7.  DM type II.  Check A1c, ISS.   DVT Prophylaxis Heparin    AM Labs Ordered, also please review Full Orders  Family Communication: Admission, patients condition and plan of care including tests being ordered have been discussed with the patient who indicates understanding and agree with the plan and Code Status.  Code Status Full  Likely DC to   Home  Condition GUARDED     Consults called:  Neurology - who requested to treat with supportive care and obtain MRI brain C and T-spine.  Nothing else to order at this point.  Admission status: Inpatient  Time spent in minutes : 40 minutes  Signature  -    Susa Raring M.D on 07/26/2023 at 5:38 PM   -  To page go to [email or web address]

## 2023-07-26 NOTE — Plan of Care (Signed)
 Discussed with Dr. Velvet Bathe, patient with known multiple sclerosis on Tecfidera (gets his care through the [place] system) presenting fever without clear source at this time and generalized weakness but much worse with right leg weakness than is typical for him which has been progressive over a couple of days.    Concern for MS flare versus pseudo flare in the setting of infection and fever   Work-up of fever per ED/primary team MRI brain, cervical spine and thoracic spine with and without contrast to evaluate for flare versus pseudo flare If MRI imaging is positive or additional neurological questions/concerns arise please reach out to neurology for full consultation  Of note, he did present very similarly 01/29/2022 as well as 03/25/2023 and did not require treatment for MS flare either of those admissions   Brooke Dare MD-PhD [facility] [phone] Available 7 AM to 7 PM, outside these hours please contact Neurologist on call listed on AMION

## 2023-07-26 NOTE — ED Notes (Signed)
 Patient transported to CT

## 2023-07-26 NOTE — Progress Notes (Signed)
 Patient received from ED alert and oriented X4, vital signs stable, tele box connected, call bell within reach, bed alarm on, will continue to monitor

## 2023-07-27 DIAGNOSIS — G35 Multiple sclerosis: Secondary | ICD-10-CM | POA: Diagnosis not present

## 2023-07-27 LAB — GLUCOSE, CAPILLARY
Glucose-Capillary: 112 mg/dL — ABNORMAL HIGH (ref 70–99)
Glucose-Capillary: 135 mg/dL — ABNORMAL HIGH (ref 70–99)
Glucose-Capillary: 134 mg/dL — ABNORMAL HIGH (ref 70–99)
Glucose-Capillary: 114 mg/dL — ABNORMAL HIGH (ref 70–99)

## 2023-07-27 LAB — CBC WITH DIFFERENTIAL/PLATELET
Abs Immature Granulocytes: 0.02 10*3/uL (ref 0.00–0.07)
Basophils Absolute: 0 10*3/uL (ref 0.0–0.1)
Basophils Relative: 0 %
Eosinophils Absolute: 0 10*3/uL (ref 0.0–0.5)
Eosinophils Relative: 0 %
HCT: 39.6 % (ref 39.0–52.0)
Hemoglobin: 13.8 g/dL (ref 13.0–17.0)
Immature Granulocytes: 0 %
Lymphocytes Relative: 5 %
Lymphs Abs: 0.3 10*3/uL — ABNORMAL LOW (ref 0.7–4.0)
MCH: 32.7 pg (ref 26.0–34.0)
MCHC: 34.8 g/dL (ref 30.0–36.0)
MCV: 93.8 fL (ref 80.0–100.0)
Monocytes Absolute: 0.6 10*3/uL (ref 0.1–1.0)
Monocytes Relative: 9 %
Neutro Abs: 5.5 10*3/uL (ref 1.7–7.7)
Neutrophils Relative %: 86 %
Platelets: 141 10*3/uL — ABNORMAL LOW (ref 150–400)
RBC: 4.22 MIL/uL (ref 4.22–5.81)
RDW: 14 % (ref 11.5–15.5)
WBC: 6.4 10*3/uL (ref 4.0–10.5)
nRBC: 0 % (ref 0.0–0.2)

## 2023-07-27 LAB — COMPREHENSIVE METABOLIC PANEL
ALT: 72 U/L — ABNORMAL HIGH (ref 0–44)
AST: 40 U/L (ref 15–41)
Albumin: 3.3 g/dL — ABNORMAL LOW (ref 3.5–5.0)
Alkaline Phosphatase: 75 U/L (ref 38–126)
Anion gap: 10 (ref 5–15)
BUN: 15 mg/dL (ref 8–23)
CO2: 24 mmol/L (ref 22–32)
Calcium: 8.4 mg/dL — ABNORMAL LOW (ref 8.9–10.3)
Chloride: 106 mmol/L (ref 98–111)
Creatinine, Ser: 1.07 mg/dL (ref 0.61–1.24)
GFR, Estimated: >60 mL/min (ref >60)
Glucose, Bld: 114 mg/dL — ABNORMAL HIGH (ref 70–99)
Potassium: 3.6 mmol/L (ref 3.5–5.1)
Sodium: 140 mmol/L (ref 135–145)
Total Bilirubin: 1.1 mg/dL (ref 0.0–1.2)
Total Protein: 6 g/dL — ABNORMAL LOW (ref 6.5–8.1)

## 2023-07-27 LAB — RESPIRATORY PANEL BY PCR

## 2023-07-27 LAB — BRAIN NATRIURETIC PEPTIDE: B Natriuretic Peptide: 26.7 pg/mL (ref 0.0–100.0)

## 2023-07-27 LAB — MAGNESIUM: Magnesium: 1.9 mg/dL (ref 1.7–2.4)

## 2023-07-27 LAB — PROCALCITONIN: Procalcitonin: 0.2 ng/mL

## 2023-07-27 LAB — PROTIME-INR
INR: 1.1 (ref 0.8–1.2)
Prothrombin Time: 14.1 s (ref 11.4–15.2)

## 2023-07-27 LAB — C-REACTIVE PROTEIN: CRP: 4.6 mg/dL — ABNORMAL HIGH (ref <1.0)

## 2023-07-27 MED ORDER — OXYBUTYNIN CHLORIDE 5 MG PO TABS
5.0000 mg | ORAL_TABLET | Freq: Two times a day (BID) | ORAL | Status: DC
  Administered 2023-07-27 – 2023-07-29 (×5): 5 mg via ORAL
  Filled 2023-07-27 (×5): qty 1

## 2023-07-27 MED ORDER — AMLODIPINE BESYLATE 5 MG PO TABS
10.0000 mg | ORAL_TABLET | Freq: Every day | ORAL | Status: DC
  Administered 2023-07-28 – 2023-07-29 (×2): 10 mg via ORAL
  Filled 2023-07-27 (×2): qty 2

## 2023-07-27 MED ORDER — DIMETHYL FUMARATE 240 MG PO CPDR
240.0000 mg | DELAYED_RELEASE_CAPSULE | Freq: Two times a day (BID) | ORAL | Status: DC
  Administered 2023-07-27 – 2023-07-29 (×4): 240 mg via ORAL
  Filled 2023-07-27 (×4): qty 1

## 2023-07-27 MED ORDER — VITAMIN C 500 MG PO TABS
500.0000 mg | ORAL_TABLET | Freq: Two times a day (BID) | ORAL | Status: DC
  Administered 2023-07-27 – 2023-07-29 (×5): 500 mg via ORAL
  Filled 2023-07-27 (×5): qty 1

## 2023-07-27 MED ORDER — DALFAMPRIDINE ER 10 MG PO TB12
10.0000 mg | ORAL_TABLET | Freq: Two times a day (BID) | ORAL | Status: DC
  Administered 2023-07-27 – 2023-07-29 (×4): 10 mg via ORAL
  Filled 2023-07-27 (×4): qty 1

## 2023-07-27 MED ORDER — BACLOFEN 10 MG PO TABS
10.0000 mg | ORAL_TABLET | Freq: Every day | ORAL | Status: DC
  Administered 2023-07-27 – 2023-07-29 (×3): 10 mg via ORAL
  Filled 2023-07-27 (×3): qty 1

## 2023-07-27 NOTE — Evaluation (Signed)
 Occupational Therapy Evaluation Patient Details Name: James Gibbs MRN: [id number] DOB: 18-Feb-1956 Today's Date: 07/27/2023   History of Present Illness   Pt is a 68 y.o. male admitted 3/2 with RLE weakness, concerning for MS flair. MRI brain negative for acute intracranial abnormality and not suggesting active demyelination. PMH: MS with RLE weakness, HTN, HLD, DM2, BPH, GERD, gout, mitral valve ant leaflet prolapse     Clinical Impressions Prior to admission, patient required assist with all iadls, but was managing is badls with DME/ AE and increased time.  He currently is min A in the hospital, but reported he is close to baseline with adls.  Overall he has adapted specific ways of accomplishing his adls and has adapted his home and methods to facilitate independence.  Overall activity tolerance is decreased.  Recommend home health OT for safety evaluation.   Will continue to follow on acute to facilitate discharge.      If plan is discharge home, recommend the following:   A little help with walking and/or transfers;A little help with bathing/dressing/bathroom;Assistance with cooking/housework;Assist for transportation;Help with stairs or ramp for entrance     Functional Status Assessment   Patient has had a recent decline in their functional status and demonstrates the ability to make significant improvements in function in a reasonable and predictable amount of time.     Equipment Recommendations         Recommendations for Other Services         Precautions/Restrictions   Precautions Precautions: Fall Recall of Precautions/Restrictions: Intact Restrictions Weight Bearing Restrictions Per Provider Order: No     Mobility Bed Mobility Overal bed mobility: Needs Assistance Bed Mobility: Supine to Sit, Sit to Supine     Supine to sit: Contact guard, HOB elevated, Used rails Sit to supine: Contact guard assist, HOB elevated, Used rails   General bed  mobility comments: increased time, multi trials to get RLE off/on bed    Transfers Overall transfer level: Needs assistance Equipment used: Rollator (4 wheels) Transfers: Sit to/from Stand Sit to Stand: From elevated surface, Min assist                  Balance             Standing balance-Leahy Scale: Poor (patient cannot stand without BUE support)                             ADL either performed or assessed with clinical judgement   ADL Overall ADL's : Needs assistance/impaired Eating/Feeding: Independent   Grooming: Wash/dry hands;Wash/dry face;Independent;Sitting   Upper Body Bathing: Modified independent;Sitting   Lower Body Bathing: Supervison/ safety   Upper Body Dressing : Modified independent;Sitting   Lower Body Dressing: Minimal assistance   Toilet Transfer: Minimal assistance;BSC/3in1   Toileting- Clothing Manipulation and Hygiene: Modified independent;Sitting/lateral lean       Functional mobility during ADLs: Minimal assistance General ADL Comments: has made several adaptations to how he does self care due to current status and functions in his home more successfully with adls.  Feel patient is close to baseline at this time     Vision Baseline Vision/History: 0 No visual deficits Ability to See in Adequate Light: 0 Adequate Patient Visual Report: No change from baseline       Perception         Praxis         Pertinent Vitals/Pain Pain Assessment Pain Assessment:  No/denies pain     Extremity/Trunk Assessment Upper Extremity Assessment Upper Extremity Assessment: Overall WFL for tasks assessed   Lower Extremity Assessment Lower Extremity Assessment: Defer to PT evaluation RLE Deficits / Details: MS primarily affecting R LE   Cervical / Trunk Assessment Cervical / Trunk Assessment: Kyphotic   Communication Communication Communication: No apparent difficulties   Cognition Arousal: Alert Behavior During  Therapy: WFL for tasks assessed/performed Cognition: No apparent impairments                               Following commands: Intact       Cueing  General Comments      takes significan increased time with mobility/adls   Exercises     Shoulder Instructions      Home Living Family/patient expects to be discharged to:: Private residence Living Arrangements: Alone Available Help at Discharge: Family;Available PRN/intermittently;Friend(s) Type of Home: House Home Access: Stairs to enter [employer] of Steps: 2 Entrance Stairs-Rails: Can reach both Home Layout: One level     Bathroom Shower/Tub: Producer, television/film/video: Standard Bathroom Accessibility: Yes How Accessible: Accessible via walker Home Equipment: Rollator (4 wheels);Shower seat;Grab bars - tub/shower;Lift chair;Cane - quad;Cane - single point;Other (comment)   Additional Comments: PCA 2 hours/day Mon-Fri      Prior Functioning/Environment Prior Level of Function : Needs assist       Physical Assist : ADLs (physical)   ADLs (physical): IADLs Mobility Comments: mod I mobility in house with upright rollator ADLs Comments: Aide comes two hours (mon-frid) to assist with cooking, cleaning, and laundry.    OT Problem List: Decreased strength;Decreased activity tolerance;Impaired balance (sitting and/or standing)   OT Treatment/Interventions:        OT Goals(Current goals can be found in the care plan section)   Acute Rehab OT Goals OT Goal Formulation: With patient Time For Goal Achievement: 08/10/23 Potential to Achieve Goals: Good   OT Frequency:  Min 1X/week    Co-evaluation              AM-PAC OT "6 Clicks" Daily Activity     Outcome Measure Help from another person eating meals?: None Help from another person taking care of personal grooming?: None Help from another person toileting, which includes using toliet, bedpan, or urinal?: A Little Help  from another person bathing (including washing, rinsing, drying)?: A Little Help from another person to put on and taking off regular upper body clothing?: A Little Help from another person to put on and taking off regular lower body clothing?: A Little 6 Click Score: 20   End of Session Equipment Utilized During Treatment: Gait belt;Rollator (4 wheels) Nurse Communication: Mobility status  Activity Tolerance: Patient tolerated treatment well Patient left: in bed;with call bell/phone within reach;with bed alarm set;with family/visitor present  OT Visit Diagnosis: Unsteadiness on feet (R26.81);Other abnormalities of gait and mobility (R26.89);Other symptoms and signs involving the nervous system (R29.898)                Time: 1308-6578 OT Time Calculation (min): 37 min Charges:  OT General Charges $OT Visit: 1 Visit OT Evaluation $OT Eval Moderate Complexity: 1 Mod  Hal Neer OTR/L   Malachi Bonds 07/27/2023, 12:56 PM

## 2023-07-27 NOTE — Progress Notes (Addendum)
 TRH ROUNDING NOTE James Gibbs NWG:[id number]  DOB: [birth year]  DOA: 07/26/2023  PCP: Greta Doom, MD  07/27/2023,10:47 AM  LOS: 1 day    Code Status: Full   From:  home   Current Dispo: Likely home   68 year old male known history of DM TY 2 HTN HLD Multiple sclerosis followed at the [place] previously on Tecfidera and dalfampridine BPH  Most recent hospitalization 10/30 through 03/26/2023 with right leg weakness from exacerbation of the MS  3/2 [place] weakness R leg, blurred vision nausea vomiting-dragging right foot more-was hypotensive on arrival tachycardic to 106 with low-grade temp 100.6 Sodium 141 potassium 3.6 BUN/creatinine 10/1.1 LFTs elevated 73/96 bilirubin 1.8 white count 10.7 lactic acid 2.2 Respiratory panel RSV influenza COVID-negative UA negative-full respiratory panel   [-] Blood cultures performed pending  Has had some dysuria placed on Rocephin  CT head no acute intracranial process CXR no cardiopulmonary process CT abdomen pelvis no acute process ventral wall hernia MR brain patchy confluent T2 flair hyperintensity consistent with multiple sclerosis and not changed from prior or progressive MRI thoracic spine normal appearance no evidence for evaluating disease MR cervical spine patchy cord abnormality throughout consistent with multiple sclerosis grossly similar to prior 09/04/2022 imaging   Plan  Possible UTI in the setting of MS Blood culture pending, urine culture apparently never performed White count is better than prior Continue ceftriaxone for now and narrow/complete 3 days DC IVF 100 cc/H Follow blood culture  Multiple sclerosis with possible pseudo flare Resumed on dalfampridine 10 mg every 12, Tecfidera 240 twice daily--resume baclofen 10 daily [facility]  HTN Slightly uncontrolled-HCTZ 25 lisinopril 40 held on admission Resume home amlodipine 10-reimplement other meds in the next 24 hours  Transaminitis Possibly secondary to OTC  products for urine and sugar-discontinued them Abdominal imaging negative Try and is coming down so likely no further workup at this time unless something changes and we will repeat labs 1 more time  DVT prophylaxis: Subcu heparin  Status is: Inpatient Remains inpatient appropriate because:   Requires further workup   Subjective: In good spirits looks better feels fair moving 4 limbs equally No fever no chills still has a little bit of mild dysuria which is not normal No chest pain   Objective + exam Vitals:   07/26/23 2336 07/27/23 0340 07/27/23 0355 07/27/23 0736  BP: (!) 148/84 (!) 154/89  (!) 148/86  Pulse: (!) 101 95  87  Resp: 18 18    Temp: 100 F (37.8 C) (!) 100.7 F (38.2 C) 99.9 F (37.7 C) 99.5 F (37.5 C)  TempSrc: Oral Oral Oral Oral  SpO2: 97% 97%  97%   There were no vitals filed for this visit.  Examination: EOMI NCAT Finger-nose-finger intact arcus Synolis present no icterus no pallor Chest is clear no wheeze rales rhonchi S1-S2 no murmur ROM intact 5/5 power reflexes 2/3 Babinski downgoing  Data Reviewed: reviewed   CBC    Component Value Date/Time   WBC 6.4 07/27/2023 0440   RBC 4.22 07/27/2023 0440   HGB 13.8 07/27/2023 0440   HCT 39.6 07/27/2023 0440   PLT 141 (L) 07/27/2023 0440   MCV 93.8 07/27/2023 0440   MCV 101.7 (A) 05/10/2013 2025   MCH 32.7 07/27/2023 0440   MCHC 34.8 07/27/2023 0440   RDW 14.0 07/27/2023 0440   LYMPHSABS 0.3 (L) 07/27/2023 0440   MONOABS 0.6 07/27/2023 0440   EOSABS 0.0 07/27/2023 0440   BASOSABS 0.0 07/27/2023 0440  Latest Ref Rng & Units 07/27/2023    4:40 AM 07/26/2023   10:40 AM 07/26/2023   10:28 AM  CMP  Glucose 70 - 99 mg/dL 782  956  213   BUN 8 - 23 mg/dL 15  12  10    Creatinine 0.61 - 1.24 mg/dL 0.86  5.78  4.69   Sodium 135 - 145 mmol/L 140  142  141   Potassium 3.5 - 5.1 mmol/L 3.6  3.5  3.6   Chloride 98 - 111 mmol/L 106  101  100   CO2 22 - 32 mmol/L 24   28   Calcium 8.9 - 10.3 mg/dL  8.4   9.0   Total Protein 6.5 - 8.1 g/dL 6.0   7.2   Total Bilirubin 0.0 - 1.2 mg/dL 1.1   1.8   Alkaline Phos 38 - 126 U/L 75   97   AST 15 - 41 U/L 40   73   ALT 0 - 44 U/L 72   96     Scheduled Meds:  allopurinol  300 mg Oral Daily   [START ON 07/28/2023] amLODipine  10 mg Oral Daily   ascorbic acid  500 mg Oral BID   atorvastatin  80 mg Oral QHS   baclofen  10 mg Oral Daily   dalfampridine  10 mg Oral Q12H   Dimethyl Fumarate  240 mg Oral BID   finasteride  5 mg Oral Daily   heparin  5,000 Units Subcutaneous Q8H   insulin aspart  0-5 Units Subcutaneous QHS   insulin aspart  0-9 Units Subcutaneous TID WC   mirtazapine  15 mg Oral QHS   oxybutynin  5 mg Oral BID   polyethylene glycol  17 g Oral BID   tamsulosin  0.8 mg Oral QPC supper   Continuous Infusions:  cefTRIAXone (ROCEPHIN)  IV Stopped (07/26/23 2306)   lactated ringers 100 mL/hr at 07/27/23 0836    Time 26  Rhetta Mura, MD  [facility]

## 2023-07-27 NOTE — TOC Initial Note (Signed)
 Transition of Care [facility]) - Initial/Assessment Note    Patient Details  Name: James Gibbs MRN: [id number] Date of Birth: 06/02/1955  Transition of [facility] CM/SW Contact:    Kermit Balo, RN Phone Number: 07/27/2023, 12:05 PM  Clinical Narrative:                  Pt is from home alone. He has a caregiver 2 1/2 hours a day 5 days a week.  Pt has CPAP at home but he doesn't use it as he gets up with incontinence during the night and says its too much of a hassle.  He has a friend that provides needed transportation.  He manages his own medications and denies any issues.  Pt rides stationary bike daily.  Awaiting PT/OT recs.  [facility]  Expected Discharge Plan:  (TBD) Barriers to Discharge: Continued Medical Work up   Patient Goals and CMS Choice            Expected Discharge Plan and Services   Discharge Planning Services: CM Consult   Living arrangements for the past 2 months: Single Family Home                                      Prior Living Arrangements/Services Living arrangements for the past 2 months: Single Family Home Lives with:: Self Patient language and need for interpreter reviewed:: Yes Do you feel safe going back to the place where you live?: Yes          Current home services: DME, Homehealth aide (CPAP/ cane/ walker/ shower seat/ bars in shower) Criminal Activity/Legal Involvement Pertinent to Current Situation/Hospitalization: No - Comment as needed  Activities of Daily Living   ADL Screening (condition at time of admission) Independently performs ADLs?: No Does the patient have a NEW difficulty with bathing/dressing/toileting/self-feeding that is expected to last >3 days?: Yes (Initiates electronic notice to provider for possible OT consult) Does the patient have a NEW difficulty with getting in/out of bed, walking, or climbing stairs that is expected to last >3 days?: Yes (Initiates electronic  notice to provider for possible PT consult) Does the patient have a NEW difficulty with communication that is expected to last >3 days?: No Is the patient deaf or have difficulty hearing?: Yes Does the patient have difficulty seeing, even when wearing glasses/contacts?: Yes Does the patient have difficulty concentrating, remembering, or making decisions?: Yes  Permission Sought/Granted                  Emotional Assessment Appearance:: Appears stated age Attitude/Demeanor/Rapport: Engaged Affect (typically observed): Accepting Orientation: : Oriented to Self, Oriented to Place, Oriented to  Time, Oriented to Situation   Psych Involvement: No (comment)  Admission diagnosis:  Right leg weakness [R29.898] Multiple sclerosis exacerbation (HCC) [G35] Fever, unspecified fever cause [R50.9] Patient Active Problem List   Diagnosis Date Noted   Multiple sclerosis exacerbation (HCC) 07/26/2023   Right leg weakness 03/26/2023   Hypokalemia 09/22/2022   Constipation 09/22/2022   Diabetes (HCC) 09/22/2022   Generalized weakness 09/04/2022   Multiple sclerosis (HCC) 01/30/2022   HTN (hypertension) 12/13/2018   Gout 12/13/2018   HYPERTENSION, UNCONTROLLED 06/04/2009   OBESITY, UNSPECIFIED 08/15/2008   MITRAL REGURGITATION 08/15/2008   MITRAL VALVE PROLAPSE 08/15/2008   PCP:  Greta Doom, MD Pharmacy:   [facility] - [place],  [place] - [id number] [facility] [address] [place] [address] Phone: [phone] Fax: [phone]  [facility] [address] [place]), [place] - [address] [place] [address] Phone: [phone] Fax: [phone]     Social Drivers of Health (SDOH) Social History: SDOH Screenings   Food Insecurity: No Food Insecurity (07/26/2023)  Housing: Low Risk  (07/26/2023)  Transportation Needs: No Transportation Needs (07/26/2023)  Utilities: Not At Risk (07/26/2023)   Financial Resource Strain: Not on File (09/18/2021)   Received from [place], [place]  Physical Activity: Not on File (09/18/2021)   Received from [place], [place]  Social Connections: Socially Isolated (07/26/2023)  Stress: Not on File (09/18/2021)   Received from [place], [place]  Tobacco Use: Low Risk  (07/26/2023)   SDOH Interventions:     Readmission Risk Interventions    09/06/2022   10:20 AM  Readmission Risk Prevention Plan  Transportation Screening Complete  PCP or Specialist Appt within 5-7 Days Complete  Home Care Screening Complete  Medication Review (RN CM) Complete

## 2023-07-27 NOTE — Evaluation (Signed)
 Physical Therapy Evaluation Patient Details Name: James Gibbs MRN: [id number] DOB: [birth year] Today's Date: 07/27/2023  History of Present Illness  Pt is a 68 y.o. male admitted 3/2 with RLE weakness, concerning for MS flair. MRI brain negative for acute intracranial abnormality and not suggesting active demyelination. PMH: MS with RLE weakness, HTN, HLD, DM2, BPH, GERD, gout, mitral valve ant leaflet prolapse   Clinical Impression  Pt admitted with above diagnosis. PTA pt lived at home alone, mod I household mobility with upright rollator. PCA 2 hours/day Mon-Fri. Friends/family also assist PRN. Pt currently with functional limitations due to the deficits listed below (see PT Problem List). On eval, pt required CGA bed mobility, min assist sit to stand, and CGA amb 120' with upright rollator. R toe drag noted with fatigue during last 30' of gait. Pt will benefit from acute skilled PT to increase their independence and safety with mobility to allow discharge. Pt feels he is at/close to his baseline. Upon d/c, recommend HHPT.         If plan is discharge home, recommend the following: Assistance with cooking/housework;Assist for transportation;Help with stairs or ramp for entrance   Can travel by private vehicle        Equipment Recommendations None recommended by PT  Recommendations for Other Services       Functional Status Assessment Patient has had a recent decline in their functional status and demonstrates the ability to make significant improvements in function in a reasonable and predictable amount of time.     Precautions / Restrictions Precautions Precautions: Fall Recall of Precautions/Restrictions: Intact      Mobility  Bed Mobility Overal bed mobility: Needs Assistance Bed Mobility: Supine to Sit, Sit to Supine     Supine to sit: Contact guard, HOB elevated, Used rails Sit to supine: Contact guard assist, HOB elevated, Used rails   General bed mobility  comments: increased time, multi trials to get RLE off/on bed    Transfers Overall transfer level: Needs assistance Equipment used: Rollator (4 wheels) (upright rollator) Transfers: Sit to/from Stand Sit to Stand: From elevated surface, Min assist           General transfer comment: increased time to power up and stabilize balance    Ambulation/Gait Ambulation/Gait assistance: Contact guard assist Gait Distance (Feet): 120 Feet Assistive device: Rollator (4 wheels) (upright rollator) Gait Pattern/deviations: Step-to pattern, Step-through pattern, Decreased step length - right, Decreased dorsiflexion - right, Trunk flexed, Narrow base of support Gait velocity: decreased Gait velocity interpretation: <1.8 ft/sec, indicate of risk for recurrent falls   General Gait Details: Initially good foot clearance bilat. R toe drag noted with fatigue during last 30' of gait.  Stairs            Wheelchair Mobility     Tilt Bed    Modified Rankin (Stroke Patients Only)       Balance Overall balance assessment: Needs assistance Sitting-balance support: Single extremity supported, Feet supported Sitting balance-Leahy Scale: Fair     Standing balance support: Bilateral upper extremity supported, During functional activity, Reliant on assistive device for balance Standing balance-Leahy Scale: Poor                               Pertinent Vitals/Pain Pain Assessment Pain Assessment: No/denies pain    Home Living Family/patient expects to be discharged to:: Private residence Living Arrangements: Alone Available Help at Discharge: Family;Available PRN/intermittently;Friend(s) (Daughter can come  help intermittenly. Has an aide mon-friday(for two hours). Friends can drive pt to doctor appointments) Type of Home: House Home Access: Stairs to enter Entrance Stairs-Rails: Can reach both Entrance Stairs-Number of Steps: 2   Home Layout: One level Home Equipment:  Rollator (4 wheels);Shower seat;Grab bars - tub/shower;Lift chair;Cane - quad;Cane - single point;Other (comment) (Upright rollator) Additional Comments: PCA 2 hours/day Mon-Fri    Prior Function Prior Level of Function : Needs assist             Mobility Comments: mod I mobility in house with upright rollator ADLs Comments: Aide comes two hours (mon-frid) to assist with cooking, cleaning, and laundry.     Extremity/Trunk Assessment   Upper Extremity Assessment Upper Extremity Assessment: Defer to OT evaluation    Lower Extremity Assessment Lower Extremity Assessment: RLE deficits/detail;Generalized weakness RLE Deficits / Details: MS primarily affecting R LE    Cervical / Trunk Assessment Cervical / Trunk Assessment: Kyphotic  Communication   Communication Communication: No apparent difficulties    Cognition Arousal: Alert Behavior During Therapy: WFL for tasks assessed/performed   PT - Cognitive impairments: No apparent impairments                         Following commands: Intact       Cueing       General Comments      Exercises     Assessment/Plan    PT Assessment Patient needs continued PT services  PT Problem List Decreased strength;Decreased balance;Decreased mobility;Decreased activity tolerance       PT Treatment Interventions Functional mobility training;Balance training;Patient/family education;Gait training;Therapeutic activities;Stair training;Therapeutic exercise    PT Goals (Current goals can be found in the Care Plan section)  Acute Rehab PT Goals Patient Stated Goal: home PT Goal Formulation: With patient Time For Goal Achievement: 08/10/23 Potential to Achieve Goals: Fair    Frequency Min 1X/week     Co-evaluation               AM-PAC PT "6 Clicks" Mobility  Outcome Measure Help needed turning from your back to your side while in a flat bed without using bedrails?: A Little Help needed moving from lying on  your back to sitting on the side of a flat bed without using bedrails?: A Little Help needed moving to and from a bed to a chair (including a wheelchair)?: A Little Help needed standing up from a chair using your arms (e.g., wheelchair or bedside chair)?: A Little Help needed to walk in hospital room?: A Little Help needed climbing 3-5 steps with a railing? : A Lot 6 Click Score: 17    End of Session Equipment Utilized During Treatment: Gait belt Activity Tolerance: Patient tolerated treatment well Patient left: in bed;with call bell/phone within reach;with bed alarm set Nurse Communication: Mobility status PT Visit Diagnosis: Other abnormalities of gait and mobility (R26.89)    Time: 1610-9604 PT Time Calculation (min) (ACUTE ONLY): 32 min   Charges:   PT Evaluation $PT Eval Moderate Complexity: 1 Mod   PT General Charges $$ ACUTE PT VISIT: 1 Visit         Ferd Glassing., PT  Office # [phone]   Ilda Foil 07/27/2023, 12:17 PM

## 2023-07-27 NOTE — Plan of Care (Signed)
  Problem: Metabolic: Goal: Ability to maintain appropriate glucose levels will improve Outcome: Not Progressing   Problem: Activity: Goal: Risk for activity intolerance will decrease Outcome: Not Progressing   Problem: Safety: Goal: Ability to remain free from injury will improve Outcome: Not Progressing

## 2023-07-28 DIAGNOSIS — G35 Multiple sclerosis: Secondary | ICD-10-CM | POA: Diagnosis not present

## 2023-07-28 LAB — COMPREHENSIVE METABOLIC PANEL
ALT: 60 U/L — ABNORMAL HIGH (ref 0–44)
AST: 35 U/L (ref 15–41)
Albumin: 2.9 g/dL — ABNORMAL LOW (ref 3.5–5.0)
Alkaline Phosphatase: 68 U/L (ref 38–126)
Anion gap: 9 (ref 5–15)
BUN: 14 mg/dL (ref 8–23)
CO2: 23 mmol/L (ref 22–32)
Calcium: 8.2 mg/dL — ABNORMAL LOW (ref 8.9–10.3)
Chloride: 107 mmol/L (ref 98–111)
Creatinine, Ser: 1.11 mg/dL (ref 0.61–1.24)
GFR, Estimated: >60 mL/min (ref >60)
Glucose, Bld: 90 mg/dL (ref 70–99)
Potassium: 3.1 mmol/L — ABNORMAL LOW (ref 3.5–5.1)
Sodium: 139 mmol/L (ref 135–145)
Total Bilirubin: 1 mg/dL (ref 0.0–1.2)
Total Protein: 5.5 g/dL — ABNORMAL LOW (ref 6.5–8.1)

## 2023-07-28 LAB — CBC WITH DIFFERENTIAL/PLATELET
Abs Immature Granulocytes: 0.01 10*3/uL (ref 0.00–0.07)
Basophils Absolute: 0 10*3/uL (ref 0.0–0.1)
Basophils Relative: 0 %
Eosinophils Absolute: 0 10*3/uL (ref 0.0–0.5)
Eosinophils Relative: 0 %
HCT: 37.9 % — ABNORMAL LOW (ref 39.0–52.0)
Hemoglobin: 13 g/dL (ref 13.0–17.0)
Immature Granulocytes: 0 %
Lymphocytes Relative: 25 %
Lymphs Abs: 1.4 10*3/uL (ref 0.7–4.0)
MCH: 32.5 pg (ref 26.0–34.0)
MCHC: 34.3 g/dL (ref 30.0–36.0)
MCV: 94.8 fL (ref 80.0–100.0)
Monocytes Absolute: 0.5 10*3/uL (ref 0.1–1.0)
Monocytes Relative: 9 %
Neutro Abs: 3.7 10*3/uL (ref 1.7–7.7)
Neutrophils Relative %: 66 %
Platelets: 124 10*3/uL — ABNORMAL LOW (ref 150–400)
RBC: 4 MIL/uL — ABNORMAL LOW (ref 4.22–5.81)
RDW: 14.1 % (ref 11.5–15.5)
WBC: 5.7 10*3/uL (ref 4.0–10.5)
nRBC: 0 % (ref 0.0–0.2)

## 2023-07-28 LAB — URINALYSIS, W/ REFLEX TO CULTURE (INFECTION SUSPECTED)
Bacteria, UA: NONE SEEN
Bilirubin Urine: NEGATIVE
Glucose, UA: NEGATIVE mg/dL
Hgb urine dipstick: NEGATIVE
Ketones, ur: NEGATIVE mg/dL
Leukocytes,Ua: NEGATIVE
Nitrite: NEGATIVE
Protein, ur: NEGATIVE mg/dL
Specific Gravity, Urine: 1.019 (ref 1.005–1.030)
pH: 5 (ref 5.0–8.0)

## 2023-07-28 LAB — GLUCOSE, CAPILLARY
Glucose-Capillary: 97 mg/dL (ref 70–99)
Glucose-Capillary: 104 mg/dL — ABNORMAL HIGH (ref 70–99)
Glucose-Capillary: 104 mg/dL — ABNORMAL HIGH (ref 70–99)
Glucose-Capillary: 110 mg/dL — ABNORMAL HIGH (ref 70–99)

## 2023-07-28 LAB — MAGNESIUM: Magnesium: 2.1 mg/dL (ref 1.7–2.4)

## 2023-07-28 MED ORDER — LISINOPRIL 20 MG PO TABS
40.0000 mg | ORAL_TABLET | Freq: Every day | ORAL | Status: DC
  Administered 2023-07-28 – 2023-07-29 (×2): 40 mg via ORAL
  Filled 2023-07-28 (×2): qty 2

## 2023-07-28 MED ORDER — HYDROCHLOROTHIAZIDE 25 MG PO TABS
25.0000 mg | ORAL_TABLET | Freq: Every day | ORAL | Status: DC
  Administered 2023-07-28 – 2023-07-29 (×2): 25 mg via ORAL
  Filled 2023-07-28 (×2): qty 1

## 2023-07-28 MED ORDER — POTASSIUM CHLORIDE CRYS ER 20 MEQ PO TBCR
40.0000 meq | EXTENDED_RELEASE_TABLET | ORAL | Status: AC
Start: 2023-07-28 — End: 2023-07-28
  Administered 2023-07-28 (×2): 40 meq via ORAL
  Filled 2023-07-28 (×2): qty 2

## 2023-07-28 NOTE — Progress Notes (Signed)
 Physical Therapy Treatment Patient Details Name: CAMILLE THAU MRN: [id number] DOB: 1956-03-19 Today's Date: 07/28/2023   History of Present Illness Pt is a 68 y.o. male admitted 3/2 with RLE weakness, concerning for MS flair. MRI brain negative for acute intracranial abnormality and not suggesting active demyelination. PMH: MS with RLE weakness, HTN, HLD, DM2, BPH, GERD, gout, mitral valve ant leaflet prolapse    PT Comments  Pt tolerated treatment well today. Pt able to progress ambulation in hallway with upright rollator at supervision level. No change in DC/DME recs at this time. PT will continue to follow.      If plan is discharge home, recommend the following: Assistance with cooking/housework;Assist for transportation;Help with stairs or ramp for entrance   Can travel by private vehicle        Equipment Recommendations  None recommended by PT    Recommendations for Other Services       Precautions / Restrictions Precautions Precautions: Fall Recall of Precautions/Restrictions: Intact Restrictions Weight Bearing Restrictions Per Provider Order: No     Mobility  Bed Mobility Overal bed mobility: Needs Assistance Bed Mobility: Supine to Sit, Sit to Supine     Supine to sit: Supervision, HOB elevated, Used rails Sit to supine: Supervision, HOB elevated, Used rails   General bed mobility comments: Increased time. no physical assist provided    Transfers Overall transfer level: Needs assistance Equipment used: Rollator (4 wheels) Transfers: Sit to/from Stand Sit to Stand: Contact guard assist           General transfer comment: increased time to power up and stabilize balance    Ambulation/Gait Ambulation/Gait assistance: Supervision Gait Distance (Feet): 250 Feet Assistive device: Rollator (4 wheels) (upright rollator) Gait Pattern/deviations: Step-to pattern, Step-through pattern, Decreased step length - right, Decreased dorsiflexion - right, Trunk  flexed, Narrow base of support Gait velocity: decreased     General Gait Details: Initially good foot clearance bilat for the first 106ft then R toe drag noted for remainder of session.   Stairs             Wheelchair Mobility     Tilt Bed    Modified Rankin (Stroke Patients Only)       Balance Overall balance assessment: Needs assistance Sitting-balance support: Single extremity supported, Feet supported Sitting balance-Leahy Scale: Fair     Standing balance support: Bilateral upper extremity supported, During functional activity, Reliant on assistive device for balance Standing balance-Leahy Scale: Poor                              Communication Communication Communication: No apparent difficulties  Cognition Arousal: Alert Behavior During Therapy: WFL for tasks assessed/performed   PT - Cognitive impairments: No apparent impairments                         Following commands: Intact      Cueing Cueing Techniques: Verbal cues  Exercises      General Comments General comments (skin integrity, edema, etc.): VSS      Pertinent Vitals/Pain Pain Assessment Pain Assessment: No/denies pain    Home Living                          Prior Function            PT Goals (current goals can now be found in the care plan section)  Progress towards PT goals: Progressing toward goals    Frequency    Min 1X/week      PT Plan      Co-evaluation              AM-PAC PT "6 Clicks" Mobility   Outcome Measure  Help needed turning from your back to your side while in a flat bed without using bedrails?: A Little Help needed moving from lying on your back to sitting on the side of a flat bed without using bedrails?: A Little Help needed moving to and from a bed to a chair (including a wheelchair)?: A Little Help needed standing up from a chair using your arms (e.g., wheelchair or bedside chair)?: A Little Help needed to  walk in hospital room?: A Little Help needed climbing 3-5 steps with a railing? : A Lot 6 Click Score: 17    End of Session Equipment Utilized During Treatment: Gait belt Activity Tolerance: Patient tolerated treatment well Patient left: in bed;with call bell/phone within reach;with bed alarm set Nurse Communication: Mobility status PT Visit Diagnosis: Other abnormalities of gait and mobility (R26.89)     Time: 8756-4332 PT Time Calculation (min) (ACUTE ONLY): 21 min  Charges:    $Gait Training: 8-22 mins PT General Charges $$ ACUTE PT VISIT: 1 Visit                     Shela Nevin, PT, DPT Acute Rehab Services [phone]    Gladys Damme 07/28/2023, 10:33 AM

## 2023-07-28 NOTE — Plan of Care (Signed)
  Problem: Education: Goal: Ability to describe self-care measures that may prevent or decrease complications (Diabetes Survival Skills Education) will improve Outcome: Progressing   Problem: Health Behavior/Discharge Planning: Goal: Ability to manage health-related needs will improve Outcome: Progressing   Problem: Metabolic: Goal: Ability to maintain appropriate glucose levels will improve Outcome: Progressing   Problem: Skin Integrity: Goal: Risk for impaired skin integrity will decrease Outcome: Progressing   Problem: Education: Goal: Knowledge of General Education information will improve Description: Including pain rating scale, medication(s)/side effects and non-pharmacologic comfort measures Outcome: Progressing   Problem: Health Behavior/Discharge Planning: Goal: Ability to manage health-related needs will improve Outcome: Progressing   Problem: Clinical Measurements: Goal: Will remain free from infection Outcome: Progressing Goal: Respiratory complications will improve Outcome: Progressing

## 2023-07-28 NOTE — Progress Notes (Signed)
 [facility] ROUNDING NOTE James Gibbs:[id number]  DOB: 04/26/1956  DOA: 07/26/2023  PCP: Greta Doom, MD  07/28/2023,12:59 PM  LOS: 2 days    Code Status: Full   From:  home   Current Dispo: Likely home   68 year old male known history of DM TY 2 HTN HLD Multiple sclerosis followed at the [place] previously on Tecfidera and dalfampridine BPH  Most recent hospitalization 10/30 through 03/26/2023 with right leg weakness from exacerbation of the MS  3/2  weakness R leg, blurred vision nausea vomiting-dragging right foot more-was hypotensive on arrival tachycardic to 106 with low-grade temp 100.6 Sodium 141 potassium 3.6 BUN/creatinine 10/1.1 LFTs elevated 73/96 bilirubin 1.8 white count 10.7 lactic acid 2.2 Respiratory panel RSV influenza COVID-negative UA negative-full respiratory panel   [-] Blood cultures performed pending  Has had some dysuria placed on Rocephin  CT head no acute intracranial process CXR no cardiopulmonary process CT abdomen pelvis no acute process ventral wall hernia MR brain patchy confluent T2 flair hyperintensity consistent with multiple sclerosis and not changed from prior or progressive MRI thoracic spine normal appearance no evidence for evaluating disease MR cervical spine patchy cord abnormality throughout consistent with multiple sclerosis grossly similar to prior 09/04/2022 imaging   Plan  Possible UTI in the setting of MS Blood culture NEG  urine culture apparently never performed White count is better than prior Continue ceftriaxone for now and narrow/complete 3 days, and if afebrile, no further issue, can d/c in am  Multiple sclerosis with possible pseudo flare Resumed on dalfampridine 10 mg every 12, Tecfidera 240 twice daily--resume baclofen 10 daily [facility] He gets outpatient PT services at home for MS we will reengage in place orders for rehab  HTN Slightly uncontrolled-HCTZ 25 lisinopril 40 held now resumed Continues  amlodine 10  Transaminitis Possibly secondary to OTC products for urine and sugar-discontinued them Abdominal imaging negative coming down so likely no further workup --OP rpt LFT ~ 3 weeks  DVT prophylaxis: Subcu heparin  Status is: Inpatient Remains inpatient appropriate because:   Requires completion antibiotics likely can go home if no issues tomorrow  Subjective:  Walked with therapy no nausea no vomiting no chest pain Looks fair  Objective + exam Vitals:   07/27/23 2326 07/28/23 0328 07/28/23 0836 07/28/23 1154  BP: (!) 145/86 (!) 140/84 137/89 130/86  Pulse: 83 85 74 77  Resp: 18 18 17 17   Temp: 98.5 F (36.9 C) 98.3 F (36.8 C) 97.8 F (36.6 C) 98.4 F (36.9 C)  TempSrc: Oral Oral Oral Oral  SpO2: 100% 96% 100% 99%   There were no vitals filed for this visit.  Examination: EOMI NCAT Finger-nose-finger intact arcus senilis present no icterus no pallor Chest clear no wheeze rales rhonchi S1-S2 no murmur no rub no gallop telemetry sinus rhythm PVC Abdomen slightly distended no rebound No lower extremity edema power 5/5   Data Reviewed: reviewed   CBC    Component Value Date/Time   WBC 5.7 07/28/2023 0618   RBC 4.00 (L) 07/28/2023 0618   HGB 13.0 07/28/2023 0618   HCT 37.9 (L) 07/28/2023 0618   PLT 124 (L) 07/28/2023 0618   MCV 94.8 07/28/2023 0618   MCV 101.7 (A) 05/10/2013 2025   MCH 32.5 07/28/2023 0618   MCHC 34.3 07/28/2023 0618   RDW 14.1 07/28/2023 0618   LYMPHSABS 1.4 07/28/2023 0618   MONOABS 0.5 07/28/2023 0618   EOSABS 0.0 07/28/2023 0618   BASOSABS 0.0 07/28/2023 0618  Latest Ref Rng & Units 07/28/2023    6:18 AM 07/27/2023    4:40 AM 07/26/2023   10:40 AM  CMP  Glucose 70 - 99 mg/dL 90  161  096   BUN 8 - 23 mg/dL 14  15  12    Creatinine 0.61 - 1.24 mg/dL 0.45  4.09  8.11   Sodium 135 - 145 mmol/L 139  140  142   Potassium 3.5 - 5.1 mmol/L 3.1  3.6  3.5   Chloride 98 - 111 mmol/L 107  106  101   CO2 22 - 32 mmol/L 23  24     Calcium 8.9 - 10.3 mg/dL 8.2  8.4    Total Protein 6.5 - 8.1 g/dL 5.5  6.0    Total Bilirubin 0.0 - 1.2 mg/dL 1.0  1.1    Alkaline Phos 38 - 126 U/L 68  75    AST 15 - 41 U/L 35  40    ALT 0 - 44 U/L 60  72      Scheduled Meds:  allopurinol  300 mg Oral Daily   amLODipine  10 mg Oral Daily   ascorbic acid  500 mg Oral BID   atorvastatin  80 mg Oral QHS   baclofen  10 mg Oral Daily   dalfampridine  10 mg Oral Q12H   Dimethyl Fumarate  240 mg Oral BID   finasteride  5 mg Oral Daily   heparin  5,000 Units Subcutaneous Q8H   hydrochlorothiazide  25 mg Oral Daily   insulin aspart  0-5 Units Subcutaneous QHS   insulin aspart  0-9 Units Subcutaneous TID WC   lisinopril  40 mg Oral Daily   mirtazapine  15 mg Oral QHS   oxybutynin  5 mg Oral BID   polyethylene glycol  17 g Oral BID   potassium chloride  40 mEq Oral Q4H   tamsulosin  0.8 mg Oral QPC supper   Continuous Infusions:  cefTRIAXone (ROCEPHIN)  IV 1 g (07/27/23 2041)    Time 18  Rhetta Mura, MD  [facility]

## 2023-07-28 NOTE — Plan of Care (Signed)
 Plan of care reviewed with patient. Orders and medications reviewed. Questions asnwered. Goals for shift initiated

## 2023-07-29 DIAGNOSIS — N3000 Acute cystitis without hematuria: Secondary | ICD-10-CM | POA: Diagnosis not present

## 2023-07-29 LAB — COMPREHENSIVE METABOLIC PANEL
ALT: 61 U/L — ABNORMAL HIGH (ref 0–44)
AST: 41 U/L (ref 15–41)
Albumin: 3 g/dL — ABNORMAL LOW (ref 3.5–5.0)
Alkaline Phosphatase: 73 U/L (ref 38–126)
Anion gap: 4 — ABNORMAL LOW (ref 5–15)
BUN: 12 mg/dL (ref 8–23)
CO2: 24 mmol/L (ref 22–32)
Calcium: 8.1 mg/dL — ABNORMAL LOW (ref 8.9–10.3)
Chloride: 110 mmol/L (ref 98–111)
Creatinine, Ser: 1.12 mg/dL (ref 0.61–1.24)
GFR, Estimated: >60 mL/min (ref >60)
Glucose, Bld: 98 mg/dL (ref 70–99)
Potassium: 3.8 mmol/L (ref 3.5–5.1)
Sodium: 138 mmol/L (ref 135–145)
Total Bilirubin: 1 mg/dL (ref 0.0–1.2)
Total Protein: 5.6 g/dL — ABNORMAL LOW (ref 6.5–8.1)

## 2023-07-29 LAB — GLUCOSE, CAPILLARY
Glucose-Capillary: 91 mg/dL (ref 70–99)
Glucose-Capillary: 112 mg/dL — ABNORMAL HIGH (ref 70–99)

## 2023-07-29 LAB — MAGNESIUM: Magnesium: 2 mg/dL (ref 1.7–2.4)

## 2023-07-29 MED ORDER — CEFADROXIL 500 MG PO CAPS: 1000.0000 mg | ORAL_CAPSULE | Freq: Two times a day (BID) | ORAL | 0 refills | Status: DC

## 2023-07-29 MED ORDER — CEFADROXIL 500 MG PO CAPS
1000.0000 mg | ORAL_CAPSULE | Freq: Two times a day (BID) | ORAL | Status: DC
  Administered 2023-07-29: 1000 mg via ORAL
  Filled 2023-07-29 (×2): qty 2

## 2023-07-29 MED ORDER — CEFADROXIL 500 MG PO CAPS: 1000.0000 mg | ORAL_CAPSULE | Freq: Two times a day (BID) | ORAL | 0 refills | Status: AC

## 2023-07-29 NOTE — Discharge Summary (Signed)
 Physician Discharge Summary  James Gibbs UJW:[id number] DOB: 07/22/1955 DOA: 07/26/2023  PCP: Greta Doom, MD  Admit date: 07/26/2023 Discharge date: 07/29/23  Admitted From: Home Disposition: Home Recommendations for Outpatient Follow-up:  Follow up with PCP and neurology in 1 to 2 weeks Check CMP and CBC at follow-up Please follow up on the following pending results: None  Home Health: HH PT Equipment/Devices: Patient has appropriate DMEs  Discharge Condition: Stable CODE STATUS: Full code  Follow-up Information     Tapp, Karen Chafe, MD. Schedule an appointment as soon as possible for a visit in 1 week(s).   Specialty: Internal Medicine Contact information: [address] [place] [address] [phone]         [facility] Follow up.   Specialty: Home Health Services Why: [facility] will contact you for the next home visit Contact information: [address] [place] [address] [phone]                 Hospital course 68 year old M with PMH of multiple sclerosis, DM-2, HTN, HLD and BPH presenting with right leg weakness, blurry vision, nausea and vomiting and admitted with working diagnosis of urinary tract infection.  Patient was slightly febrile 200.6.  WBC 10.7.  Basic labs unrevealing other than mildly elevated LFT.  Lactic acid was 2.2.  COVID-19, influenza and RSV PCR nonreactive.  MRI brain, cervical and thoracic spine without new enhancing lesions to suggest multiple sclerosis flareup.  Blood cultures obtained.  He was started on IV ceftriaxone due to report of some dysuria.  Unfortunately, urine culture was not sent.  On the day of charge, patient felt well and back to his baseline.  He received ceftriaxone for 3 days.  Blood cultures NGTD.  Therapy recommended home health.  He is discharged on p.o. cefadroxil 1 g twice daily for 3 more days to complete treatment course for possible UTI.  Patient will continue his  outpatient Tecfidera and dalfampridine.  Recommended outpatient follow-up with PCP and neurology in 1 to 2 weeks.   See individual problem list below for more.   Problems addressed during this hospitalization Possible urinary tract infection: Dysuria with mild fever and leukocytosis on presentation.  Also some lactic acidosis.  Unfortunately urine culture was not sent.  Blood culture NGTD.  Received IV ceftriaxone for 3 days and discharged on p.o. cefadroxil for 3 more days.  Multiple sclerosis without acute flare -Continue home meds -Outpatient follow-up with neurology -Home health PT  Essential hypertension -Continue home meds  Transaminitis: Unclear etiology.  Could be due to statin and OTC products.  Improved.  CT abdomen and pelvis without significant finding.  Improved. -Recheck at follow-up  History of BPH: On oxybutynin, Flomax and Proscar.  I am not sure if he needs to be on oxybutynin unless he has overactive bladder from MS. -Reassess at follow-up.           Time spent 35 minutes  Vital signs Vitals:   07/28/23 2343 07/29/23 0403 07/29/23 0756 07/29/23 1142  BP: 134/89 131/87 124/83 (!) 143/82  Pulse: 82 81 73 79  Temp: 98.6 F (37 C) 98.3 F (36.8 C) 98.7 F (37.1 C) 99.9 F (37.7 C)  Resp: 18 18 20 20   SpO2: 95% 98% 99% 97%  TempSrc: Oral Oral Oral Oral     Discharge exam  GENERAL: No apparent distress.  Nontoxic. HEENT: MMM.  Vision and hearing grossly intact.  NECK: Supple.  No apparent JVD.  RESP:  No IWOB.  Fair aeration bilaterally. CVS:  RRR. Heart sounds normal.  ABD/GI/GU: BS+. Abd soft, NTND.  MSK/EXT:  Moves extremities. No apparent deformity. No edema.  SKIN: no apparent skin lesion or wound NEURO: Awake and alert. Oriented appropriately.  No apparent focal neuro deficit. PSYCH: Calm. Normal affect.   Discharge Instructions Discharge Instructions     Diet - low sodium heart healthy   Complete by: As directed    Diet Carb Modified    Complete by: As directed    Discharge instructions   Complete by: As directed    It has been a pleasure taking care of you!  You were hospitalized for possible UTI.  Your MRI brain and and neck did not show new lesions to suggest multiple sclerosis flare.  Your symptoms improved with treatment of UTI.  We are discharging you on more antibiotics to complete treatment course.  Follow-up with your neurologist and primary care doctor in the next 1 to 2 weeks or sooner if needed.  Take your medications as prescribed.   Take care,   Increase activity slowly   Complete by: As directed       Allergies as of 07/29/2023   No Known Allergies      Medication List     TAKE these medications    acetaminophen 325 MG tablet Commonly known as: TYLENOL Take 325-650 mg by mouth every 6 (six) hours as needed for mild pain (pain score 1-3) or headache.   allopurinol 300 MG tablet Commonly known as: ZYLOPRIM Take 300 mg by mouth daily.   amLODipine 10 MG tablet Commonly known as: NORVASC Take 10 mg by mouth daily.   ascorbic acid 500 MG tablet Commonly known as: VITAMIN C Take 500 mg by mouth 2 (two) times daily.   atorvastatin 80 MG tablet Commonly known as: LIPITOR Take 80 mg by mouth at bedtime.   baclofen 10 MG tablet Commonly known as: LIORESAL Take 10 mg by mouth daily.   carboxymethylcellulose 0.5 % Soln Commonly known as: REFRESH PLUS Place 1 drop into both eyes 4 (four) times daily as needed (dry eyes).   cefadroxil 500 MG capsule Commonly known as: DURICEF Take 2 capsules (1,000 mg total) by mouth 2 (two) times daily for 3 days.   chlorhexidine 0.12 % solution Commonly known as: PERIDEX Use as directed 15 mLs in the mouth or throat daily.   dalfampridine 10 MG Tb12 Take 1 tablet by mouth every 12 (twelve) hours.   finasteride 5 MG tablet Commonly known as: PROSCAR Take 5 mg by mouth daily.   hydrochlorothiazide 25 MG tablet Commonly known as: HYDRODIURIL Take  25 mg by mouth daily.   lisinopril 40 MG tablet Commonly known as: ZESTRIL Take 40 mg by mouth daily.   melatonin 3 MG Tabs tablet Take 1 tablet (3 mg total) by mouth at bedtime as needed (insomnia).   metFORMIN 500 MG 24 hr tablet Commonly known as: GLUCOPHAGE-XR Take 500 mg by mouth daily.   mirtazapine 15 MG tablet Commonly known as: REMERON Take 3.75-7.5 mg by mouth See admin instructions. Take 1/2 tablet (7.5mg ) by mouth at bedtime, may repeat with 1/4 tablet (3.75mg ) f you wake up - up to twice a night.   multivitamin with minerals Tabs tablet Take 1 tablet by mouth daily.   OVER THE COUNTER MEDICATION Take 0.5 mLs by mouth daily. OTC prostate supplement "Potent Stream"   OVER THE COUNTER MEDICATION Take 0.5 mLs by mouth daily. OTC  supplement "Glucose Support"   oxybutynin 5 MG tablet Commonly known as: DITROPAN Take 5 mg by mouth 2 (two) times daily.   polyethylene glycol 17 g packet Commonly known as: MIRALAX / GLYCOLAX Take 17 g by mouth 2 (two) times daily. What changed: when to take this   potassium chloride SA 20 MEQ tablet Commonly known as: KLOR-CON M Take 20 mEq by mouth daily.   tamsulosin 0.4 MG Caps capsule Commonly known as: FLOMAX Take 2 capsules (0.8 mg total) by mouth daily after supper.   Tecfidera 240 MG Cpdr Generic drug: Dimethyl Fumarate Take 240 mg by mouth 2 (two) times daily.        Consultations: None  Procedures/Studies:   MR THORACIC SPINE W WO CONTRAST Result Date: 07/26/2023 CLINICAL DATA:  Initial evaluation for chronic myelopathy. Demyelinating disease. EXAM: MRI THORACIC WITHOUT AND WITH CONTRAST TECHNIQUE: Multiplanar and multiecho pulse sequences of the thoracic spine were obtained without and with intravenous contrast. CONTRAST:  10mL GADAVIST GADOBUTROL 1 MMOL/ML IV SOLN COMPARISON:  Prior MRI from 01/30/2022 FINDINGS: Alignment: Vertebral bodies normally aligned with preservation of the normal thoracic kyphosis. No  listhesis. Vertebrae: Vertebral body height maintained without acute or chronic fracture. Bone marrow signal intensity within normal limits. No worrisome osseous lesions. Degenerative reactive endplate change with marrow edema and enhancement present about the T1-2 interspace. More mild reactive endplate changes noted at T5-6 as well. No other abnormal marrow edema or enhancement. Cord: Normal signal and morphology. No visible evidence for demyelinating disease within the thoracic spinal cord. No abnormal enhancement. Paraspinal and other soft tissues: Paraspinous soft tissues demonstrate no acute finding. Prominent fatty atrophy within the lower thoracic paraspinous musculature. Small layering bilateral pleural effusions noted. Few scattered T2 hyperintense renal cysts noted, benign in appearance, no follow-up imaging recommended. Disc levels: T1-2: Disc desiccation with diffuse disc bulge. Prominent reactive endplate change. Superimposed right paracentral to foraminal disc protrusion. Mild facet hypertrophy. No spinal stenosis. Moderate right worse than left foraminal narrowing. T2-3: Minimal disc bulge.  No canal or foraminal stenosis. T3-4: Tiny right paracentral disc protrusion minimally indents the ventral thecal sac. No spinal stenosis. Foramina remain patent. T4-5: Endplate spurring without significant disc bulge. Mild facet hypertrophy. No stenosis. T5-6: Minimal disc bulge. Mild posterior element hypertrophy. No significant spinal stenosis. Mild left foraminal narrowing. Right neural foramen remains patent. T6-7: Disc desiccation with superimposed tiny right paracentral disc protrusion. No spinal stenosis. Foramina remain patent. T7-8: Disc desiccation with superimposed tiny left paracentral disc protrusion. No spinal stenosis. Foramina remain patent. T8-9: Endplate spurring without significant disc bulge. No stenosis. T9-10: Disc desiccation without significant disc bulge. Mild bilateral facet  hypertrophy. No stenosis. T10-11: Disc desiccation without significant disc bulge. Left greater than right facet hypertrophy. No spinal stenosis. Mild right with severe left foraminal narrowing. T11-12: Mild diffuse disc bulge with disc desiccation. Mild bilateral facet hypertrophy. No spinal stenosis. Moderate bilateral foraminal narrowing. T12-L1:  Negative interspace.  Mild facet hypertrophy.  No stenosis. IMPRESSION: 1. Normal MRI appearance of the thoracic spinal cord. No evidence for demyelinating disease. No abnormal enhancement. 2. Multilevel thoracic spondylosis without significant spinal stenosis. Associated multilevel foraminal narrowing as above, most pronounced at T10-11 on the left where stenosis is severe in nature. 3. Small layering bilateral pleural effusions. Electronically Signed   By: Rise Mu M.D.   On: 07/26/2023 23:08   MR CERVICAL SPINE W WO CONTRAST Result Date: 07/26/2023 CLINICAL DATA:  Initial evaluation for multiple sclerosis. EXAM: MRI CERVICAL SPINE WITHOUT  AND WITH CONTRAST TECHNIQUE: Multiplanar and multiecho pulse sequences of the cervical spine, to include the craniocervical junction and cervicothoracic junction, were obtained without and with intravenous contrast. CONTRAST:  10mL GADAVIST GADOBUTROL 1 MMOL/ML IV SOLN COMPARISON:  Prior MRI from 09/04/2022 FINDINGS: Alignment: Straightening with mild smooth reversal of the normal cervical lordosis. No listhesis. Vertebrae: Vertebral body height maintained without acute or chronic fracture. Bone marrow signal intensity within normal limits. No worrisome osseous lesions. No abnormal marrow edema or enhancement. Cord: Patchy cord signal abnormality seen throughout the cervical spinal cord, extending from approximately C1 through the upper thoracic spine. Overall, appearance is grossly similar to previous MRI without significant interval disease progression. No abnormal enhancement to suggest active demyelination.  Underlying mild diffuse spinal cord atrophy noted. Posterior Fossa, vertebral arteries, paraspinal tissues: Unremarkable. Disc levels: C2-C3: Mild disc bulge with uncovertebral spurring. Advanced left with mild right facet arthrosis. No spinal stenosis. Mild right C3 foraminal narrowing. Left neural foramen remains patent. C3-C4: Degenerative intervertebral disc space narrowing with diffuse disc osteophyte complex. Posterior component flattens the ventral thecal sac. No spinal stenosis. Moderate left C4 foraminal narrowing. Right neural foramen is patent. C4-C5: Degenerate intervertebral disc space narrowing with diffuse disc osteophyte complex. Flattening of the ventral thecal sac without significant spinal stenosis. Moderate left with mild right C5 foraminal stenosis. C5-C6: Degenerative intervertebral disc space narrowing with diffuse disc osteophyte complex. Superimposed left paracentral disc protrusion with slight inferior migration (series 8, image 34). Mild cord flattening with mild spinal stenosis. Foramina remain patent. C6-C7: Degenerative disc space narrowing with diffuse disc osteophyte complex. No significant spinal stenosis. Mild left C7 foraminal narrowing. Right neural foramen remains patent. C7-T1: Degenerative disc space narrowing with diffuse disc osteophyte complex. Mild right-sided facet hypertrophy. No spinal stenosis. Foramina remain patent. IMPRESSION: 1. Patchy cord signal abnormality throughout the cervical spinal cord, consistent with history of multiple sclerosis. Overall, appearance is grossly similar to previous MRI from 09/04/2022 without significant interval disease progression. No abnormal enhancement to suggest active demyelination. 2. Underlying multilevel cervical spondylosis with resultant mild spinal stenosis at C5-6. Associated moderate left C4 and C5 foraminal stenosis, with mild right C3 and C7 foraminal narrowing. Electronically Signed   By: Rise Mu M.D.   On:  07/26/2023 22:54   MR BRAIN W WO CONTRAST Result Date: 07/26/2023 CLINICAL DATA:  Initial evaluation for multiple sclerosis. EXAM: MRI HEAD WITHOUT AND WITH CONTRAST TECHNIQUE: Multiplanar, multiecho pulse sequences of the brain and surrounding structures were obtained without and with intravenous contrast. CONTRAST:  10mL GADAVIST GADOBUTROL 1 MMOL/ML IV SOLN COMPARISON:  Prior MRI from 03/26/2023 FINDINGS: Brain: Mild age-related cerebral atrophy. Patchy and confluent T2/FLAIR hyperintensity seen involving the periventricular and deep white matter both cerebral hemispheres. Multiple corresponding T1 black holes. Findings consistent with provided history of multiple sclerosis. Overall, appearance is not significantly changed or progressed as compared to previous MRI. No abnormal enhancement to suggest active demyelination. No evidence for acute or subacute infarct. Gray-white matter differentiation maintained. No acute or chronic intracranial blood products. No mass lesion, midline shift or mass effect. Mild ventricular prominence related global parenchymal volume loss without hydrocephalus. No extra-axial fluid collection. Pituitary gland within normal limits. No other abnormal enhancement. Vascular: Major intracranial vascular flow voids are maintained. Skull and upper cervical spine: Craniocervical junction within normal limits. Bone marrow signal intensity normal. No scalp soft tissue abnormality. Sinuses/Orbits: Globes orbital soft tissues within normal limits. Few scattered retention cyst noted about the maxillary sinuses. Underlying mild mucosal thickening throughout  the paranasal sinuses. No significant mastoid effusion. Other: None. IMPRESSION: 1. Patchy and confluent T2/FLAIR hyperintensity involving the supratentorial cerebral white matter, consistent with provided history of multiple sclerosis. Overall, appearance is not significantly changed or progressed as compared to previous MRI from 03/26/2023.  No evidence for active demyelination. 2. No other acute intracranial abnormality. Electronically Signed   By: Rise Mu M.D.   On: 07/26/2023 22:45   CT ABDOMEN PELVIS W CONTRAST Result Date: 07/26/2023 CLINICAL DATA:  Sepsis nausea and vomiting. History of multiple sclerosis EXAM: CT ABDOMEN AND PELVIS WITH CONTRAST TECHNIQUE: Multidetector CT imaging of the abdomen and pelvis was performed using the standard protocol following bolus administration of intravenous contrast. RADIATION DOSE REDUCTION: This exam was performed according to the departmental dose-optimization program which includes automated exposure control, adjustment of the mA and/or kV according to patient size and/or use of iterative reconstruction technique. CONTRAST:  75mL OMNIPAQUE IOHEXOL 350 MG/ML SOLN COMPARISON:  12/20/2018 FINDINGS: Lower chest: Dependent bibasilar subsegmental atelectasis. Left hemidiaphragm elevation. Normal heart size with trace bilateral pleural fluid. Hepatobiliary: Normal liver. Normal gallbladder, without biliary ductal dilatation. Pancreas: Normal, without mass or ductal dilatation. Spleen: Normal in size, without focal abnormality. Adrenals/Urinary Tract: Normal left adrenal gland. Minimal right adrenal nodularity. Left renal 2.6 cm interpolar cyst. Other bilateral renal lesions are too small to characterize but likely cysts . In the absence of clinically indicated signs/symptoms require(s) no independent follow-up. No hydronephrosis. Normal urinary bladder. Stomach/Bowel: The stomach is primarily underdistended, especially proximally. Normal colon, appendix, and terminal ileum. Normal small bowel. Vascular/Lymphatic: Aortic atherosclerosis. No abdominopelvic adenopathy. Reproductive: Mild prostatomegaly. Other: No significant free fluid. No free intraperitoneal air. Ventral abdominal wall hernia repair with mesh. Residual or recurrent small fat containing hernia above and right of the umbilicus, similar  to 2020 (47/3). Musculoskeletal: Lumbosacral spondylosis. IMPRESSION: 1.  No acute process or explanation for sepsis. 2. Trace bilateral pleural fluid and bibasilar subsegmental atelectasis. 3. Prior ventral abdominal wall hernia repair with similar small fat containing right paramidline upper abdominal hernia. 4.  Aortic Atherosclerosis (ICD10-I70.0). Electronically Signed   By: Jeronimo Greaves M.D.   On: 07/26/2023 16:00   DG Chest 2 View Result Date: 07/26/2023 CLINICAL DATA:  Fever. EXAM: CHEST - 2 VIEW COMPARISON:  Radiographs 09/03/2022 and 01/29/2022. FINDINGS: Lordotic positioning on the frontal examination. Mild motion artifact on the lateral view. The heart size and mediastinal contours are stable. The lungs appear clear. No pleural effusion or pneumothorax. No acute osseous findings are evident. IMPRESSION: No evidence of acute cardiopulmonary process. Electronically Signed   By: Carey Bullocks M.D.   On: 07/26/2023 11:58   CT Head Wo Contrast Result Date: 07/26/2023 CLINICAL DATA:  Weakness in the right leg.  Nausea and vomiting. EXAM: CT HEAD WITHOUT CONTRAST TECHNIQUE: Contiguous axial images were obtained from the base of the skull through the vertex without intravenous contrast. RADIATION DOSE REDUCTION: This exam was performed according to the departmental dose-optimization program which includes automated exposure control, adjustment of the mA and/or kV according to patient size and/or use of iterative reconstruction technique. COMPARISON:  Head CT dated 03/25/2023. FINDINGS: Brain: No evidence of acute infarction, hemorrhage, hydrocephalus, extra-axial collection or mass lesion/mass effect. There is mild cerebral volume loss with associated ex vacuo dilatation. Periventricular white matter hypoattenuation likely represents chronic small vessel ischemic disease. A few scattered bilateral cerebral white matter hypodensities likely reflect changes from chronic demyelinating disease/multiple  sclerosis. Vascular: No hyperdense vessel or unexpected calcification. Skull: Normal. Negative for fracture  or focal lesion. Sinuses/Orbits: There is right maxillary and left ethmoid sinus disease. Other: None. IMPRESSION: 1. No acute intracranial process. Electronically Signed   By: Romona Curls M.D.   On: 07/26/2023 11:46       The results of significant diagnostics from this hospitalization (including imaging, microbiology, ancillary and laboratory) are listed below for reference.     Microbiology: Recent Results (from the past 240 hours)  Resp panel by RT-PCR (RSV, Flu A&B, Covid) Anterior Nasal Swab     Status: None   Collection Time: 07/26/23 10:31 AM   Specimen: Anterior Nasal Swab  Result Value Ref Range Status   SARS Coronavirus 2 by RT PCR NEGATIVE NEGATIVE Final   Influenza A by PCR NEGATIVE NEGATIVE Final   Influenza B by PCR NEGATIVE NEGATIVE Final    Comment: (NOTE) The Xpert Xpress SARS-CoV-2/FLU/RSV plus assay is intended as an aid in the diagnosis of influenza from Nasopharyngeal swab specimens and should not be used as a sole basis for treatment. Nasal washings and aspirates are unacceptable for Xpert Xpress SARS-CoV-2/FLU/RSV testing.  Fact Sheet for Patients: BloggerCourse.com  Fact Sheet for Healthcare Providers: SeriousBroker.it  This test is not yet approved or cleared by the [place] FDA and has been authorized for detection and/or diagnosis of SARS-CoV-2 by FDA under an Emergency Use Authorization (EUA). This EUA will remain in effect (meaning this test can be used) for the duration of the COVID-19 declaration under Section 564(b)(1) of the Act, 21 U.S.C. section 360bbb-3(b)(1), unless the authorization is terminated or revoked.     Resp Syncytial Virus by PCR NEGATIVE NEGATIVE Final    Comment: (NOTE) Fact Sheet for Patients: BloggerCourse.com  Fact Sheet for  Healthcare Providers: SeriousBroker.it  This test is not yet approved or cleared by the [place] FDA and has been authorized for detection and/or diagnosis of SARS-CoV-2 by FDA under an Emergency Use Authorization (EUA). This EUA will remain in effect (meaning this test can be used) for the duration of the COVID-19 declaration under Section 564(b)(1) of the Act, 21 U.S.C. section 360bbb-3(b)(1), unless the authorization is terminated or revoked.  Performed at [facility], [address] [place], [place] [address]   Respiratory (~20 pathogens) panel by PCR     Status: None   Collection Time: 07/26/23 10:31 AM   Specimen: Nasopharyngeal Swab; Respiratory  Result Value Ref Range Status   Adenovirus NOT DETECTED NOT DETECTED Final   Coronavirus 229E NOT DETECTED NOT DETECTED Final    Comment: (NOTE) The Coronavirus on the Respiratory Panel, DOES NOT test for the novel  Coronavirus (2019 nCoV)    Coronavirus HKU1 NOT DETECTED NOT DETECTED Final   Coronavirus NL63 NOT DETECTED NOT DETECTED Final   Coronavirus OC43 NOT DETECTED NOT DETECTED Final   Metapneumovirus NOT DETECTED NOT DETECTED Final   Rhinovirus / Enterovirus NOT DETECTED NOT DETECTED Final   Influenza A NOT DETECTED NOT DETECTED Final   Influenza B NOT DETECTED NOT DETECTED Final   Parainfluenza Virus 1 NOT DETECTED NOT DETECTED Final   Parainfluenza Virus 2 NOT DETECTED NOT DETECTED Final   Parainfluenza Virus 3 NOT DETECTED NOT DETECTED Final   Parainfluenza Virus 4 NOT DETECTED NOT DETECTED Final   Respiratory Syncytial Virus NOT DETECTED NOT DETECTED Final   Bordetella pertussis NOT DETECTED NOT DETECTED Final   Bordetella Parapertussis NOT DETECTED NOT DETECTED Final   Chlamydophila pneumoniae NOT DETECTED NOT DETECTED Final   Mycoplasma pneumoniae NOT DETECTED NOT DETECTED Final  Comment: Performed at [facility], [address] [place], [place] [address]  Blood  culture (routine x 2)     Status: None (Preliminary result)   Collection Time: 07/26/23  1:21 PM   Specimen: BLOOD LEFT ARM  Result Value Ref Range Status   Specimen Description BLOOD LEFT ARM  Final   Special Requests   Final    BOTTLES DRAWN AEROBIC AND ANAEROBIC Blood Culture adequate volume   Culture   Final    NO GROWTH 3 DAYS Performed at [facility], [address] [place], [place] [address]    Report Status PENDING  Incomplete  Blood culture (routine x 2)     Status: None (Preliminary result)   Collection Time: 07/27/23  4:40 AM   Specimen: BLOOD RIGHT HAND  Result Value Ref Range Status   Specimen Description BLOOD RIGHT HAND  Final   Special Requests   Final    BOTTLES DRAWN AEROBIC AND ANAEROBIC Blood Culture adequate volume   Culture   Final    NO GROWTH 2 DAYS Performed at [facility], [address], [place], [place] [address]    Report Status PENDING  Incomplete     Labs:  CBC: Recent Labs  Lab 07/26/23 1028 07/26/23 1040 07/26/23 1952 07/27/23 0440 07/28/23 0618  WBC 10.7*  --  8.5 6.4 5.7  NEUTROABS  --   --   --  5.5 3.7  HGB 16.5 16.7 14.4 13.8 13.0  HCT 47.4 49.0 41.9 39.6 37.9*  MCV 95.2  --  94.8 93.8 94.8  PLT 164  --  141* 141* 124*   BMP &GFR Recent Labs  Lab 07/26/23 1028 07/26/23 1040 07/27/23 0440 07/28/23 0618 07/29/23 0612  NA 141 142 140 139 138  K 3.6 3.5 3.6 3.1* 3.8  CL 100 101 106 107 110  CO2 28  --  24 23 24   GLUCOSE 119* 113* 114* 90 98  BUN 10 12 15 14 12   CREATININE 1.18 1.20 1.07 1.11 1.12  CALCIUM 9.0  --  8.4* 8.2* 8.1*  MG  --   --  1.9 2.1 2.0   CrCl cannot be calculated (Unknown ideal weight.). Liver & Pancreas: Recent Labs  Lab 07/26/23 1028 07/27/23 0440 07/28/23 0618 07/29/23 0612  AST 73* 40 35 41  ALT 96* 72* 60* 61*  ALKPHOS 97 75 68 73  BILITOT 1.8* 1.1 1.0 1.0  PROT 7.2 6.0* 5.5* 5.6*  ALBUMIN 4.1 3.3* 2.9* 3.0*   No results for input(s): "LIPASE", "AMYLASE" in the last 168  hours. No results for input(s): "AMMONIA" in the last 168 hours. Diabetic: Recent Labs    07/26/23 1952  HGBA1C 5.9*   Recent Labs  Lab 07/28/23 1154 07/28/23 1611 07/28/23 2115 07/29/23 0608 07/29/23 1140  GLUCAP 104* 104* 110* 91 112*   Cardiac Enzymes: No results for input(s): "CKTOTAL", "CKMB", "CKMBINDEX", "TROPONINI" in the last 168 hours. No results for input(s): "PROBNP" in the last 8760 hours. Coagulation Profile: Recent Labs  Lab 07/26/23 1028 07/27/23 0440  INR 1.0 1.1   Thyroid Function Tests: No results for input(s): "TSH", "T4TOTAL", "FREET4", "T3FREE", "THYROIDAB" in the last 72 hours. Lipid Profile: No results for input(s): "CHOL", "HDL", "LDLCALC", "TRIG", "CHOLHDL", "LDLDIRECT" in the last 72 hours. Anemia Panel: No results for input(s): "VITAMINB12", "FOLATE", "FERRITIN", "TIBC", "IRON", "RETICCTPCT" in the last 72 hours. Urine analysis:    Component Value Date/Time   COLORURINE YELLOW 07/28/2023 0345   APPEARANCEUR  CLEAR 07/28/2023 0345   LABSPEC 1.019 07/28/2023 0345   PHURINE 5.0 07/28/2023 0345   GLUCOSEU NEGATIVE 07/28/2023 0345   HGBUR NEGATIVE 07/28/2023 0345   BILIRUBINUR NEGATIVE 07/28/2023 0345   BILIRUBINUR neg 05/10/2013 2025   KETONESUR NEGATIVE 07/28/2023 0345   PROTEINUR NEGATIVE 07/28/2023 0345   UROBILINOGEN 0.2 05/10/2013 2025   NITRITE NEGATIVE 07/28/2023 0345   LEUKOCYTESUR NEGATIVE 07/28/2023 0345   Sepsis Labs: Invalid input(s): "PROCALCITONIN", "LACTICIDVEN"   SIGNED:  Almon Hercules, MD  [facility] 07/29/2023, 5:17 PM

## 2023-07-29 NOTE — TOC Transition Note (Signed)
 Transition of Care [facility]) - Discharge Note   Patient Details  Name: James Gibbs MRN: [id number] Date of Birth: 1955-06-07  [facility]) CM/SW Contact:  Kermit Balo, RN Phone Number: 07/29/2023, 10:25 AM   Clinical Narrative:     Pt is discharging home with resumption of home health services through [facility]. [facility] will contact him for the next home visit. Pt has needed DME at home.  Pt has transportation home.  Final next level of care: Home w Home Health Services Barriers to Discharge: No Barriers Identified   Patient Goals and CMS Choice            Discharge Placement                       Discharge Plan and Services Additional resources added to the After Visit Summary for     Discharge Planning Services: CM Consult                      HH Arranged: PT, OT [facility] Agency: [facility] Date [facility] Agency Contacted: 07/29/23   Representative spoke with at [facility] Agency: Clifton Custard  Social Drivers of Health (SDOH) Interventions SDOH Screenings   Food Insecurity: No Food Insecurity (07/26/2023)  Housing: Low Risk  (07/26/2023)  Transportation Needs: No Transportation Needs (07/26/2023)  Utilities: Not At Risk (07/26/2023)  Financial Resource Strain: Not on File (09/18/2021)   Received from [place], [place]  Physical Activity: Not on File (09/18/2021)   Received from [place], [place]  Social Connections: Socially Isolated (07/26/2023)  Stress: Not on File (09/18/2021)   Received from [place], [place]  Tobacco Use: Low Risk  (07/26/2023)     Readmission Risk Interventions    09/06/2022   10:20 AM  Readmission Risk Prevention Plan  Transportation Screening Complete  PCP or Specialist Appt within 5-7 Days Complete  Home Care Screening Complete  Medication Review (RN CM) Complete

## 2023-07-31 LAB — CULTURE, BLOOD (ROUTINE X 2)
Culture: NO GROWTH
Special Requests: ADEQUATE

## 2023-08-01 LAB — CULTURE, BLOOD (ROUTINE X 2)
Culture: NO GROWTH
Special Requests: ADEQUATE

## 2023-10-06 ENCOUNTER — Observation Stay (HOSPITAL_COMMUNITY)
Admission: EM | Admit: 2023-10-06 | Discharge: 2023-10-09 | Disposition: A | Discharge status: Home Health | Attending: Internal Medicine | Admitting: Internal Medicine

## 2023-10-06 ENCOUNTER — Encounter (HOSPITAL_COMMUNITY): Payer: Self-pay

## 2023-10-06 ENCOUNTER — Other Ambulatory Visit: Payer: Self-pay

## 2023-10-06 ENCOUNTER — Emergency Department (HOSPITAL_COMMUNITY)

## 2023-10-06 DIAGNOSIS — E876 Hypokalemia: Secondary | ICD-10-CM | POA: Insufficient documentation

## 2023-10-06 DIAGNOSIS — R531 Weakness: Principal | ICD-10-CM

## 2023-10-06 DIAGNOSIS — N400 Enlarged prostate without lower urinary tract symptoms: Secondary | ICD-10-CM | POA: Insufficient documentation

## 2023-10-06 DIAGNOSIS — Z79899 Other long term (current) drug therapy: Secondary | ICD-10-CM | POA: Diagnosis not present

## 2023-10-06 DIAGNOSIS — G35D Multiple sclerosis, unspecified: Secondary | ICD-10-CM | POA: Diagnosis present

## 2023-10-06 DIAGNOSIS — M549 Dorsalgia, unspecified: Secondary | ICD-10-CM | POA: Diagnosis not present

## 2023-10-06 DIAGNOSIS — I10 Essential (primary) hypertension: Secondary | ICD-10-CM | POA: Insufficient documentation

## 2023-10-06 DIAGNOSIS — E1159 Type 2 diabetes mellitus with other circulatory complications: Secondary | ICD-10-CM | POA: Diagnosis present

## 2023-10-06 DIAGNOSIS — E785 Hyperlipidemia, unspecified: Secondary | ICD-10-CM | POA: Insufficient documentation

## 2023-10-06 DIAGNOSIS — Z7984 Long term (current) use of oral hypoglycemic drugs: Secondary | ICD-10-CM | POA: Diagnosis not present

## 2023-10-06 DIAGNOSIS — I152 Hypertension secondary to endocrine disorders: Secondary | ICD-10-CM | POA: Diagnosis present

## 2023-10-06 DIAGNOSIS — E119 Type 2 diabetes mellitus without complications: Secondary | ICD-10-CM

## 2023-10-06 DIAGNOSIS — G35 Multiple sclerosis: Secondary | ICD-10-CM | POA: Diagnosis not present

## 2023-10-06 DIAGNOSIS — E1169 Type 2 diabetes mellitus with other specified complication: Secondary | ICD-10-CM | POA: Diagnosis present

## 2023-10-06 LAB — COMPREHENSIVE METABOLIC PANEL WITH GFR
ALT: 58 U/L — ABNORMAL HIGH (ref 0–44)
AST: 32 U/L (ref 15–41)
Albumin: 3.7 g/dL (ref 3.5–5.0)
Alkaline Phosphatase: 84 U/L (ref 38–126)
Anion gap: 10 (ref 5–15)
BUN: 11 mg/dL (ref 8–23)
CO2: 28 mmol/L (ref 22–32)
Calcium: 9.3 mg/dL (ref 8.9–10.3)
Chloride: 102 mmol/L (ref 98–111)
Creatinine, Ser: 1.08 mg/dL (ref 0.61–1.24)
GFR, Estimated: >60 mL/min (ref >60)
Glucose, Bld: 108 mg/dL — ABNORMAL HIGH (ref 70–99)
Potassium: 3.3 mmol/L — ABNORMAL LOW (ref 3.5–5.1)
Sodium: 140 mmol/L (ref 135–145)
Total Bilirubin: 1.1 mg/dL (ref 0.0–1.2)
Total Protein: 6.6 g/dL (ref 6.5–8.1)

## 2023-10-06 LAB — URINALYSIS, ROUTINE W REFLEX MICROSCOPIC
Bilirubin Urine: NEGATIVE
Glucose, UA: NEGATIVE mg/dL
Hgb urine dipstick: NEGATIVE
Ketones, ur: NEGATIVE mg/dL
Leukocytes,Ua: NEGATIVE
Nitrite: NEGATIVE
Protein, ur: NEGATIVE mg/dL
Specific Gravity, Urine: 1.012 (ref 1.005–1.030)
pH: 6 (ref 5.0–8.0)

## 2023-10-06 LAB — CBC
HCT: 40.6 % (ref 39.0–52.0)
Hemoglobin: 14.1 g/dL (ref 13.0–17.0)
MCH: 32.9 pg (ref 26.0–34.0)
MCHC: 34.7 g/dL (ref 30.0–36.0)
MCV: 94.9 fL (ref 80.0–100.0)
Platelets: 170 10*3/uL (ref 150–400)
RBC: 4.28 MIL/uL (ref 4.22–5.81)
RDW: 13.7 % (ref 11.5–15.5)
WBC: 6.8 10*3/uL (ref 4.0–10.5)
nRBC: 0 % (ref 0.0–0.2)

## 2023-10-06 MED ORDER — KETOROLAC TROMETHAMINE 15 MG/ML IJ SOLN
15.0000 mg | Freq: Once | INTRAMUSCULAR | Status: AC
  Administered 2023-10-06: 15 mg via INTRAMUSCULAR
  Filled 2023-10-06: qty 1

## 2023-10-06 NOTE — H&P (Signed)
 History and Physical    James Gibbs:[id number] DOB: 1956/03/31 DOA: 10/06/2023  PCP: [facility], [facility]  Patient coming from: Home  I have personally briefly reviewed patient's old medical records in [facility]  Chief Complaint: Generalized weakness, lower back pain  HPI: James Gibbs is a 68 y.o. male with medical history significant for multiple sclerosis, T2DM, HTN, HLD, BPH who presented to the ED for evaluation generalized weakness.  Patient states he lives alone at home.  Normally ambulates well with use of a rollator walker.  He says over the last 2 days he has been feeling generally weak throughout his whole body.  He has had significant difficulty getting in and out of bed.  He says when he does get in bed he has a lot of trouble getting out.  He says he does have physical therapy and home aide help at home.  He has not had any new numbness/tingling.  He denies change in vision, headache, nausea, vomiting, chest pain, dyspnea.  ED Course  Labs/Imaging on admission: I have personally reviewed following labs and imaging studies.  Initial vitals showed BP 140/96, pulse 76, RR 16, temp 98.8 F, SpO2 99% on room air.  Labs show WBC 6.8, hemoglobin 14.1, platelets 170, sodium 140, potassium 3.3, bicarb 28, BUN 11, creatinine 1.08, serum glucose 108.  UA negative for UTI.  X-ray L-spine showed multilevel degenerative changes worst at L4-5.  Pelvic x-ray negative for fracture or malalignment.  EDP spoke with on-call neurology who recommended obtaining MRI brain, cervical and thoracic spine.  The hospitalist service was consulted to admit.  Review of Systems: All systems reviewed and are negative except as documented in history of present illness above.   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    Malignant hypertension    Mitral valve anterior leaflet prolapse    Mixed  hyperlipidemia    Multiple sclerosis (HCC)     Past Surgical History:  Procedure Laterality Date   COLONOSCOPY WITH PROPOFOL  N/A 12/17/2018   Procedure: COLONOSCOPY WITH PROPOFOL ;  Surgeon: Lanita Pitman, MD;  Location: [facility] ENDOSCOPY;  Service: Endoscopy;  Laterality: N/A;   ESOPHAGOGASTRODUODENOSCOPY (EGD) WITH PROPOFOL  N/A 12/14/2018   Procedure: ESOPHAGOGASTRODUODENOSCOPY (EGD) MODERATE SEDATION;  Surgeon: Lanita Pitman, MD;  Location: [facility] ENDOSCOPY;  Service: Endoscopy;  Laterality: N/A;   GIVENS CAPSULE STUDY N/A 12/18/2018   Procedure: GIVENS CAPSULE STUDY;  Surgeon: Evangeline Hilts, MD;  Location: [facility] ENDOSCOPY;  Service: Endoscopy;  Laterality: N/A;   none      Social History: Social History   Tobacco Use   Smoking status: Never   Smokeless tobacco: Never  Substance Use Topics   Alcohol  use: No   Drug use: No   No Known Allergies  Family History  Problem Relation Age of Onset   Hypertension Mother    Hypertension Father      Prior to Admission medications   Medication Sig Start Date End Date Taking? Authorizing Provider  acetaminophen  (TYLENOL ) 325 MG tablet Take 325-650 mg by mouth every 6 (six) hours as needed for mild pain (pain score 1-3) or headache.    [provider]  allopurinol  (ZYLOPRIM ) 300 MG tablet Take 300 mg by mouth daily.    [provider]  amLODipine  (NORVASC ) 10 MG tablet Take 10 mg by mouth daily.    [provider]  ascorbic acid  (VITAMIN C ) 500 MG tablet Take 500  mg by mouth 2 (two) times daily.    [provider]  atorvastatin  (LIPITOR ) 80 MG tablet Take 80 mg by mouth at bedtime.    [provider]  baclofen  (LIORESAL ) 10 MG tablet Take 10 mg by mouth daily.    [provider]  carboxymethylcellulose (REFRESH PLUS) 0.5 % SOLN Place 1 drop into both eyes 4 (four) times daily as needed (dry eyes).    [provider]  chlorhexidine  (PERIDEX ) 0.12 % solution Use as directed 15 mLs in  the mouth or throat daily. 07/03/23   [provider]  dalfampridine  10 MG TB12 Take 1 tablet by mouth every 12 (twelve) hours.    [provider]  Dimethyl Fumarate  (TECFIDERA ) 240 MG CPDR Take 240 mg by mouth 2 (two) times daily.    [provider]  finasteride  (PROSCAR ) 5 MG tablet Take 5 mg by mouth daily.    [provider]  hydrochlorothiazide  (HYDRODIURIL ) 25 MG tablet Take 25 mg by mouth daily.    [provider]  lisinopril  (PRINIVIL ,ZESTRIL ) 40 MG tablet Take 40 mg by mouth daily.    [provider]  melatonin 3 MG TABS tablet Take 1 tablet (3 mg total) by mouth at bedtime as needed (insomnia). 09/19/22   Love, Renay Carota, PA-C  metFORMIN  (GLUCOPHAGE -XR) 500 MG 24 hr tablet Take 500 mg by mouth daily. 05/29/22   [provider]  mirtazapine  (REMERON ) 15 MG tablet Take 3.75-7.5 mg by mouth See admin instructions. Take 1/2 tablet (7.5mg ) by mouth at bedtime, may repeat with 1/4 tablet (3.75mg ) f you wake up - up to twice a night. 06/16/22   [provider]  Multiple Vitamin (MULTIVITAMIN WITH MINERALS) TABS tablet Take 1 tablet by mouth daily.    [provider]  OVER THE COUNTER MEDICATION Take 0.5 mLs by mouth daily. OTC prostate supplement "Potent Stream"    [provider]  OVER THE COUNTER MEDICATION Take 0.5 mLs by mouth daily. OTC supplement "Glucose Support"    [provider]  oxybutynin  (DITROPAN ) 5 MG tablet Take 5 mg by mouth 2 (two) times daily.    [provider]  polyethylene glycol (MIRALAX  / GLYCOLAX ) 17 g packet Take 17 g by mouth 2 (two) times daily. Patient taking differently: Take 17 g by mouth daily. 09/19/22   Love, Renay Carota, PA-C  potassium chloride  SA (KLOR-CON  M) 20 MEQ tablet Take 20 mEq by mouth daily.    [provider]  tamsulosin  (FLOMAX ) 0.4 MG CAPS capsule Take 2 capsules (0.8 mg total) by mouth daily after supper. 09/15/22   Zelda Hickman, PA-C     Physical Exam: Vitals:   10/06/23 2001 10/06/23 2002 10/07/23 0003  BP:  (!) 140/96 (!) 146/78  Pulse:  76 81  Resp:  16 18  Temp:  98.8 F (37.1 C) 98.4 F (36.9 C)  TempSrc:  Oral   SpO2:  99% 99%  Weight: 103 kg    Height: 5\' 11"  (1.803 m)     Constitutional: Resting in bed, NAD, calm, comfortable Eyes: EOMI, lids and conjunctivae normal, arcus senilis ENMT: Mucous membranes are moist. Posterior pharynx clear of any exudate or lesions.Normal dentition.  Neck: normal, supple, no masses. Respiratory: clear to auscultation bilaterally, no wheezing, no crackles. Normal respiratory effort. No accessory muscle use.  Cardiovascular: Regular rate and rhythm, no murmurs / rubs / gallops. No extremity edema. 2+ pedal pulses. Abdomen: no tenderness, no masses palpated. Musculoskeletal: no clubbing / cyanosis.  No joint deformity upper and lower extremities. Good ROM, no contractures. Normal muscle tone.  Skin: no rashes, lesions, ulcers. No induration Neurologic: Sensation intact. Strength 5/5 in all 4 while resting in bed.  Gait not assessed. Psychiatric: Normal judgment and insight. Alert and oriented x 3. Normal mood.   EKG: Personally reviewed. Sinus rhythm, rate 76, first-degree AV block, no acute ischemic changes.  Similar to previous.  Assessment/Plan Principal Problem:   Generalized weakness Active Problems:   Hypertension associated with diabetes (HCC)   Multiple sclerosis (HCC)   Type 2 diabetes mellitus (HCC)   Hyperlipidemia associated with type 2 diabetes mellitus (HCC)   James Gibbs is a 68 y.o. male with medical history significant for multiple sclerosis, T2DM, HTN, HLD, BPH who is admitted with generalized weakness.  Assessment and Plan: Multiple sclerosis Generalized weakness: Patient with 2 days of generalized weakness, no other significant features.  I have lower suspicion for MS flare but workup indicated. - Follow MRI brain and whole spine - Fall  precautions, PT/OT eval - Check CK level - Continue home dalfampridine  and Tecfidera   Type 2 diabetes: Holding metformin .  Placed on SSI.  Hypokalemia: Supplementing.  Hypertension: Resume home lisinopril , amlodipine , HCTZ.  Hyperlipidemia: Hold statin pending CK level.  BPH: Continue Flomax  and Proscar .   DVT prophylaxis: enoxaparin  (LOVENOX ) injection 40 mg Start: 10/07/23 0045 Code Status: Full code, confirmed with patient on admission Family Communication: Discussed with patient, he has discussed with family Disposition Plan: From home, dispo pending clinical progress Consults called: EDP discussed with neurology Severity of Illness: The appropriate patient status for this patient is OBSERVATION. Observation status is judged to be reasonable and necessary in order to provide the required intensity of service to ensure the patient's safety. The patient's presenting symptoms, physical exam findings, and initial radiographic and laboratory data in the context of their medical condition is felt to place them at decreased risk for further clinical deterioration. Furthermore, it is anticipated that the patient will be medically stable for discharge from the hospital within 2 midnights of admission.   Edith Gores MD [facility]  If 7PM-7AM, please contact night-coverage [email or web address]  10/07/2023, 12:44 AM

## 2023-10-06 NOTE — ED Triage Notes (Signed)
 Pt BIB [facility] from home for lower back pain and generalized weakness since yesterday. H/x MS.  134/90 HR 76 [id number]

## 2023-10-06 NOTE — ED Provider Notes (Signed)
 [place] [facility]ERGENCY DEPARTMENT AT [facility] Provider Note   CSN: [id number] Arrival date & time: 10/06/23  1955     History  Chief Complaint  Patient presents with   Weakness   Back Pain    James Gibbs is a 68 y.o. male.  HPI 68 year old male with a history of malignant hypertension, diabetes, hyperlipidemia, MS who presents to the emergency department complaints of several days of weakness.  He cannot characterize his weakness.  States he has just been laying around in bed.  He feels tired when using the restroom.  He denies any known fevers.  Was admitted back in March for questionable UTI.  He complains of lower back pain which is not uncommon for him but today was slightly more severe than normal.  He denies any bowel or bladder incontinence, does endorse urinary frequency but states that he takes medication for this and was told he has an enlarged prostate.  He does endorse dragging his right foot and having difficulty lifting it though states that this is chronic.  He denies any falls.  He states the reason that he came to the emergency department was due to his back pain.  Reports compliance with his dalfampridine .  Cannot tell me if this feels like a normal MS flare for him.  Per chart review, he was admitted with similar presentation with weakness, fever, underwent MRI brain, cervical and thoracic spine which showed patchy cord signal abnormality throughout the cervical spinal cord consistent with MS diagnosis, grossly similar in appearance from prior MRI.  He also underwent a MRI lumbar spine in October 2024, which showed a right disc protrusion at L3-4, potentially irritating the exiting right L3 nerve root.  Also mild clumping of the distal nerve roots of the cauda equina at the level of L5-S1.  Findings were nonspecific but could reflect changes of arachnoiditis, recommending CT myelogram.  Also some degenerative changes throughout the T and L-spine noted.     Home Medications Prior to Admission medications   Medication Sig Start Date End Date Taking? Authorizing Provider  allopurinol  (ZYLOPRIM ) 300 MG tablet Take 300 mg by mouth daily.   Yes [provider]  amLODipine  (NORVASC ) 10 MG tablet Take 10 mg by mouth daily.   Yes [provider]  atorvastatin  (LIPITOR ) 80 MG tablet Take 80 mg by mouth at bedtime.   Yes [provider]  baclofen  (LIORESAL ) 10 MG tablet Take 10 mg by mouth 2 (two) times daily.   Yes [provider]  carboxymethylcellulose (REFRESH PLUS) 0.5 % SOLN Place 3 drops into both eyes daily as needed (dry eyes).   Yes [provider]  chlorhexidine  (PERIDEX ) 0.12 % solution Use as directed 15 mLs in the mouth or throat daily. 07/03/23  Yes [provider]  dalfampridine  10 MG TB12 Take 1 tablet by mouth every 12 (twelve) hours.   Yes [provider]  Dimethyl Fumarate  (TECFIDERA ) 240 MG CPDR Take 240 mg by mouth 2 (two) times daily.   Yes [provider]  finasteride  (PROSCAR ) 5 MG tablet Take 5 mg by mouth daily.   Yes [provider]  hydrochlorothiazide  (HYDRODIURIL ) 25 MG tablet Take 25 mg by mouth daily.   Yes [provider]  lisinopril  (PRINIVIL ,ZESTRIL ) 40 MG tablet Take 40 mg by mouth daily.   Yes [provider]  metFORMIN  (GLUCOPHAGE -XR) 500 MG 24 hr tablet Take 500 mg by mouth daily. 05/29/22  Yes [provider]  mirtazapine  (REMERON )  15 MG tablet Take 7.5 mg by mouth at bedtime. 06/16/22  Yes [provider]  Multiple Vitamin (MULTIVITAMIN WITH MINERALS) TABS tablet Take 1 tablet by mouth in the morning and at bedtime.   Yes [provider]  OVER THE COUNTER MEDICATION Take 0.5 mLs by mouth daily. OTC prostate supplement "Potent Stream"   Yes [provider]  OVER THE COUNTER MEDICATION Take 0.5 mLs by mouth daily. OTC supplement "Glucose Support"   Yes [provider]  oxybutynin   (DITROPAN ) 5 MG tablet Take 5 mg by mouth daily.   Yes [provider]  polyethylene glycol (MIRALAX  / GLYCOLAX ) 17 g packet Take 17 g by mouth 2 (two) times daily. Patient taking differently: Take 17 g by mouth daily. 09/19/22  Yes Love, Renay Carota, PA-C  potassium chloride  SA (KLOR-CON  M) 20 MEQ tablet Take 20 mEq by mouth daily.   Yes [provider]  tamsulosin  (FLOMAX ) 0.4 MG CAPS capsule Take 2 capsules (0.8 mg total) by mouth daily after supper. 09/15/22  Yes Love, Renay Carota, PA-C  ascorbic acid  (VITAMIN C ) 500 MG tablet Take 500 mg by mouth 2 (two) times daily. Patient not taking: Reported on 10/07/2023    [provider]  pantoprazole  (PROTONIX ) 40 MG tablet Take 40 mg by mouth 2 (two) times daily. Patient not taking: Reported on 10/07/2023    [provider]      Allergies    Patient has no known allergies.    Review of Systems   Review of Systems Ten systems reviewed and are negative for acute change, except as noted in the HPI.   Physical Exam Updated Vital Signs BP 135/88 (BP Location: Left Arm)   Pulse 71   Temp 97.7 F (36.5 C) (Oral)   Resp 19   Ht 5\' 11"  (1.803 m)   Wt 103 kg   SpO2 97%   BMI 31.67 kg/m  Physical Exam Vitals and nursing note reviewed.  Constitutional:      General: He is not in acute distress.    Appearance: He is well-developed.  HENT:     Head: Normocephalic and atraumatic.  Eyes:     Conjunctiva/sclera: Conjunctivae normal.  Cardiovascular:     Rate and Rhythm: Normal rate and regular rhythm.     Heart sounds: No murmur heard. Pulmonary:     Effort: Pulmonary effort is normal. No respiratory distress.     Breath sounds: Normal breath sounds.  Abdominal:     Palpations: Abdomen is soft.     Tenderness: There is no abdominal tenderness. There is no right CVA tenderness or left CVA tenderness.  Musculoskeletal:        General: No swelling.     Cervical back: Neck supple.     Comments: Midline tenderness  to the lumbar spine, slow to move but able to range bilateral legs, 4/5 strength in bilateral lower extremities.  Sensation intact.  No CVA tenderness.  Equal strength and sensation in upper extremities.  Skin:    General: Skin is warm and dry.     Capillary Refill: Capillary refill takes less than 2 seconds.  Neurological:     Mental Status: He is alert.  Psychiatric:        Mood and Affect: Mood normal.     ED Results / Procedures / Treatments   Labs (all labs ordered are listed, but only abnormal results are displayed) Labs Reviewed  COMPREHENSIVE METABOLIC PANEL WITH GFR - Abnormal; Notable for the  following components:      Result Value   Potassium 3.3 (*)    Glucose, Bld 108 (*)    ALT 58 (*)    All other components within normal limits  BASIC METABOLIC PANEL WITH GFR - Abnormal; Notable for the following components:   Calcium  8.6 (*)    All other components within normal limits  GLUCOSE, CAPILLARY - Abnormal; Notable for the following components:   Glucose-Capillary 108 (*)    All other components within normal limits  GLUCOSE, CAPILLARY - Abnormal; Notable for the following components:   Glucose-Capillary 160 (*)    All other components within normal limits  GLUCOSE, CAPILLARY - Abnormal; Notable for the following components:   Glucose-Capillary 106 (*)    All other components within normal limits  GLUCOSE, CAPILLARY - Abnormal; Notable for the following components:   Glucose-Capillary 154 (*)    All other components within normal limits  GLUCOSE, CAPILLARY - Abnormal; Notable for the following components:   Glucose-Capillary 109 (*)    All other components within normal limits  GLUCOSE, CAPILLARY - Abnormal; Notable for the following components:   Glucose-Capillary 179 (*)    All other components within normal limits  GLUCOSE, CAPILLARY - Abnormal; Notable for the following components:   Glucose-Capillary 136 (*)    All other components within normal limits   GLUCOSE, CAPILLARY - Abnormal; Notable for the following components:   Glucose-Capillary 146 (*)    All other components within normal limits  GLUCOSE, CAPILLARY - Abnormal; Notable for the following components:   Glucose-Capillary 107 (*)    All other components within normal limits  CBC  URINALYSIS, ROUTINE W REFLEX MICROSCOPIC  CK  CBC  CBG MONITORING, ED    EKG EKG Interpretation Date/Time:  Tuesday Oct 06 2023 20:07:48 EDT Ventricular Rate:  76 PR Interval:  258 QRS Duration:  84 QT Interval:  362 QTC Calculation: 407 R Axis:   19  Text Interpretation: Sinus rhythm with 1st degree A-V block Nonspecific T wave abnormality Abnormal ECG Confirmed by Orvilla Blander ([id number]) on 10/08/2023 12:27:12 AM  Radiology No results found.   Procedures Procedures    Medications Ordered in ED Medications  ketorolac  (TORADOL ) 15 MG/ML injection 15 mg (15 mg Intramuscular Given 10/06/23 2232)  potassium chloride  (KLOR-CON ) packet 60 mEq (60 mEq Oral Given 10/07/23 0141)  gadobutrol  (GADAVIST ) 1 MMOL/ML injection 10 mL (10 mLs Intravenous Contrast Given 10/07/23 0258)    ED Course/ Medical Decision Making/ A&P Clinical Course as of 10/18/23 0634  Tue Oct 06, 2023  2230 I ordered, personally interpreted labs, CMP with mild hypokalemia 3.3, glucose 108, ALT 58 which is slightly chronically elevated.  CBC unremarkable.  Will obtain x-rays of the lumbar spine and pelvis and UA.  He does not have notable weakness in the lower extremities, at this time I have lower suspicion for cauda equina or acute MS flare. [MB]  2307 Attempted to ambulate patient with the RN.  He was not able to lift himself up with a walker even with RN assist.  He states that he lives alone and normally ambulates with a walker.  With this, patient is not safe for discharge.  X-ray of the pelvis with some degenerative changes, x-ray lumbar with multilevel degenerative changes, worse at L4-5. [MB]  2328 UA negative [MB]   2330 Consult placed to neurology  [MB]  2343 Discussed with Dr. Alecia Ames.  Recommending repeat MRI brain, cervical spine, thoracic spine for evaluation of MS.  Will also order MRI given persistent complaints of lumbar back pain and weakness.  He was given 15 mg IM Toradol .  Hospitalist consulted for admission. [MB]    Clinical Course User Index [MB] Refugia Canton, PA-C                                   Final Clinical Impression(s) / ED Diagnoses Final diagnoses:  Weakness    Rx / DC Orders ED Discharge Orders          Ordered    Diet - low sodium heart healthy        10/09/23 0842    Discharge instructions       Comments: Please follow up with Neurology in 2 to 4 weeks.   10/09/23 0842              Refugia Canton, PA-C 10/18/23 0636    Iva Mariner, MD 10/18/23 [phone]

## 2023-10-07 ENCOUNTER — Observation Stay (HOSPITAL_COMMUNITY)

## 2023-10-07 DIAGNOSIS — E1169 Type 2 diabetes mellitus with other specified complication: Secondary | ICD-10-CM | POA: Diagnosis present

## 2023-10-07 DIAGNOSIS — E1159 Type 2 diabetes mellitus with other circulatory complications: Secondary | ICD-10-CM | POA: Diagnosis not present

## 2023-10-07 DIAGNOSIS — R531 Weakness: Secondary | ICD-10-CM | POA: Diagnosis not present

## 2023-10-07 DIAGNOSIS — G35 Multiple sclerosis: Secondary | ICD-10-CM

## 2023-10-07 DIAGNOSIS — I152 Hypertension secondary to endocrine disorders: Secondary | ICD-10-CM

## 2023-10-07 DIAGNOSIS — E119 Type 2 diabetes mellitus without complications: Secondary | ICD-10-CM

## 2023-10-07 DIAGNOSIS — E785 Hyperlipidemia, unspecified: Secondary | ICD-10-CM

## 2023-10-07 LAB — BASIC METABOLIC PANEL WITH GFR
Anion gap: 9 (ref 5–15)
BUN: 12 mg/dL (ref 8–23)
CO2: 27 mmol/L (ref 22–32)
Calcium: 8.6 mg/dL — ABNORMAL LOW (ref 8.9–10.3)
Chloride: 103 mmol/L (ref 98–111)
Creatinine, Ser: 0.99 mg/dL (ref 0.61–1.24)
GFR, Estimated: >60 mL/min (ref >60)
Glucose, Bld: 92 mg/dL (ref 70–99)
Potassium: 3.7 mmol/L (ref 3.5–5.1)
Sodium: 139 mmol/L (ref 135–145)

## 2023-10-07 LAB — GLUCOSE, CAPILLARY
Glucose-Capillary: 108 mg/dL — ABNORMAL HIGH (ref 70–99)
Glucose-Capillary: 160 mg/dL — ABNORMAL HIGH (ref 70–99)
Glucose-Capillary: 106 mg/dL — ABNORMAL HIGH (ref 70–99)
Glucose-Capillary: 154 mg/dL — ABNORMAL HIGH (ref 70–99)

## 2023-10-07 LAB — CBC
HCT: 41 % (ref 39.0–52.0)
Hemoglobin: 14.3 g/dL (ref 13.0–17.0)
MCH: 32.9 pg (ref 26.0–34.0)
MCHC: 34.9 g/dL (ref 30.0–36.0)
MCV: 94.3 fL (ref 80.0–100.0)
Platelets: 160 10*3/uL (ref 150–400)
RBC: 4.35 MIL/uL (ref 4.22–5.81)
RDW: 13.7 % (ref 11.5–15.5)
WBC: 6.6 10*3/uL (ref 4.0–10.5)
nRBC: 0 % (ref 0.0–0.2)

## 2023-10-07 LAB — CK: Total CK: 133 U/L (ref 49–397)

## 2023-10-07 MED ORDER — ALLOPURINOL 100 MG PO TABS
300.0000 mg | ORAL_TABLET | Freq: Every day | ORAL | Status: DC
  Administered 2023-10-07 – 2023-10-09 (×3): 300 mg via ORAL
  Filled 2023-10-07 (×3): qty 3

## 2023-10-07 MED ORDER — DIMETHYL FUMARATE 240 MG PO CPDR
240.0000 mg | DELAYED_RELEASE_CAPSULE | Freq: Two times a day (BID) | ORAL | Status: DC
  Administered 2023-10-07 – 2023-10-09 (×4): 240 mg via ORAL
  Filled 2023-10-07 (×4): qty 1

## 2023-10-07 MED ORDER — ONDANSETRON HCL 4 MG PO TABS: 4.0000 mg | ORAL_TABLET | Freq: Four times a day (QID) | ORAL | Status: DC | PRN

## 2023-10-07 MED ORDER — ENOXAPARIN SODIUM 40 MG/0.4ML IJ SOSY
40.0000 mg | PREFILLED_SYRINGE | INTRAMUSCULAR | Status: DC
  Administered 2023-10-07 – 2023-10-09 (×3): 40 mg via SUBCUTANEOUS
  Filled 2023-10-07 (×3): qty 0.4

## 2023-10-07 MED ORDER — LISINOPRIL 20 MG PO TABS
40.0000 mg | ORAL_TABLET | Freq: Every day | ORAL | Status: DC
Start: 2023-10-07 — End: 2023-10-09
  Administered 2023-10-07 – 2023-10-09 (×3): 40 mg via ORAL
  Filled 2023-10-07 (×3): qty 2

## 2023-10-07 MED ORDER — TAMSULOSIN HCL 0.4 MG PO CAPS
0.8000 mg | ORAL_CAPSULE | Freq: Every day | ORAL | Status: DC
  Administered 2023-10-07 – 2023-10-08 (×2): 0.8 mg via ORAL
  Filled 2023-10-07 (×2): qty 2

## 2023-10-07 MED ORDER — ATORVASTATIN CALCIUM 80 MG PO TABS
80.0000 mg | ORAL_TABLET | Freq: Every day | ORAL | Status: DC
  Administered 2023-10-07 – 2023-10-08 (×2): 80 mg via ORAL
  Filled 2023-10-07 (×2): qty 1

## 2023-10-07 MED ORDER — GADOBUTROL 1 MMOL/ML IV SOLN
10.0000 mL | Freq: Once | INTRAVENOUS | Status: AC | PRN
  Administered 2023-10-07: 10 mL via INTRAVENOUS

## 2023-10-07 MED ORDER — AMLODIPINE BESYLATE 5 MG PO TABS
10.0000 mg | ORAL_TABLET | Freq: Every day | ORAL | Status: DC
  Administered 2023-10-07 – 2023-10-09 (×3): 10 mg via ORAL
  Filled 2023-10-07 (×3): qty 2

## 2023-10-07 MED ORDER — ACETAMINOPHEN 325 MG PO TABS: 650.0000 mg | ORAL_TABLET | Freq: Four times a day (QID) | ORAL | Status: DC | PRN

## 2023-10-07 MED ORDER — SENNOSIDES-DOCUSATE SODIUM 8.6-50 MG PO TABS: 1.0000 | ORAL_TABLET | Freq: Every evening | ORAL | Status: DC | PRN

## 2023-10-07 MED ORDER — MIRTAZAPINE 15 MG PO TABS
7.5000 mg | ORAL_TABLET | Freq: Every day | ORAL | Status: DC
  Administered 2023-10-07 – 2023-10-08 (×2): 7.5 mg via ORAL
  Filled 2023-10-07 (×2): qty 1

## 2023-10-07 MED ORDER — MIRTAZAPINE 7.5 MG PO TABS: 3.7500 mg | ORAL_TABLET | ORAL | Status: DC

## 2023-10-07 MED ORDER — DALFAMPRIDINE ER 10 MG PO TB12
10.0000 mg | ORAL_TABLET | Freq: Two times a day (BID) | ORAL | Status: DC
  Administered 2023-10-07 – 2023-10-09 (×4): 10 mg via ORAL
  Filled 2023-10-07 (×4): qty 1

## 2023-10-07 MED ORDER — HYDROCHLOROTHIAZIDE 25 MG PO TABS
25.0000 mg | ORAL_TABLET | Freq: Every day | ORAL | Status: DC
  Administered 2023-10-07 – 2023-10-09 (×3): 25 mg via ORAL
  Filled 2023-10-07 (×3): qty 1

## 2023-10-07 MED ORDER — SODIUM CHLORIDE 0.9% FLUSH
3.0000 mL | Freq: Two times a day (BID) | INTRAVENOUS | Status: DC
  Administered 2023-10-07 – 2023-10-09 (×6): 3 mL via INTRAVENOUS

## 2023-10-07 MED ORDER — ONDANSETRON HCL 4 MG/2ML IJ SOLN: 4.0000 mg | Freq: Four times a day (QID) | INTRAMUSCULAR | Status: DC | PRN

## 2023-10-07 MED ORDER — POTASSIUM CHLORIDE 20 MEQ PO PACK
60.0000 meq | PACK | Freq: Once | ORAL | Status: AC
  Administered 2023-10-07: 60 meq via ORAL
  Filled 2023-10-07: qty 3

## 2023-10-07 MED ORDER — INSULIN ASPART 100 UNIT/ML IJ SOLN
0.0000 [IU] | Freq: Three times a day (TID) | INTRAMUSCULAR | Status: DC
  Administered 2023-10-07 – 2023-10-08 (×2): 2 [IU] via SUBCUTANEOUS
  Administered 2023-10-08: 1 [IU] via SUBCUTANEOUS

## 2023-10-07 MED ORDER — FINASTERIDE 5 MG PO TABS
5.0000 mg | ORAL_TABLET | Freq: Every day | ORAL | Status: DC
  Administered 2023-10-07 – 2023-10-09 (×3): 5 mg via ORAL
  Filled 2023-10-07 (×3): qty 1

## 2023-10-07 MED ORDER — MIRTAZAPINE 7.5 MG PO TABS: 3.7500 mg | ORAL_TABLET | Freq: Every evening | ORAL | Status: DC | PRN

## 2023-10-07 MED ORDER — BISACODYL 5 MG PO TBEC: 5.0000 mg | DELAYED_RELEASE_TABLET | Freq: Every day | ORAL | Status: DC | PRN

## 2023-10-07 MED ORDER — BACLOFEN 10 MG PO TABS
10.0000 mg | ORAL_TABLET | Freq: Every day | ORAL | Status: DC
  Administered 2023-10-07 – 2023-10-09 (×3): 10 mg via ORAL
  Filled 2023-10-07 (×3): qty 1

## 2023-10-07 MED ORDER — KETOROLAC TROMETHAMINE 15 MG/ML IJ SOLN: 15.0000 mg | Freq: Four times a day (QID) | INTRAMUSCULAR | Status: DC | PRN

## 2023-10-07 MED ORDER — MELATONIN 3 MG PO TABS: 3.0000 mg | ORAL_TABLET | Freq: Every evening | ORAL | Status: DC | PRN

## 2023-10-07 MED ORDER — ACETAMINOPHEN 650 MG RE SUPP: 650.0000 mg | Freq: Four times a day (QID) | RECTAL | Status: DC | PRN

## 2023-10-07 NOTE — Progress Notes (Addendum)
 Received pt's home meds: dalfampridine  and dimethy fumarate. Brought the meds to the pharmacy. Placed the medication form in pt's chart.  Oral Billings, RN

## 2023-10-07 NOTE — Progress Notes (Signed)
 [facility] night cross cover note:   Per patient request, I have resumed his home atorvastatin , with first dose now.    Camelia Cavalier, DO Hospitalist

## 2023-10-07 NOTE — Care Management (Signed)
 Confirmed that patient is active with [facility] PT OT

## 2023-10-07 NOTE — Evaluation (Signed)
 Occupational Therapy Evaluation Patient Details Name: James Gibbs MRN: [id number] DOB: Feb 09, 1956 Today's Date: 10/07/2023   History of Present Illness   Pt is a 68 y.o. male admitted to [facility] on 10/06/23 feeling generally weak throughout his whole body.  He has had significant difficulty getting in and out of bed.  He says when he does get in bed he has a lot of trouble getting out.  He says he does have physical therapy and home aide help at home. Admission concerning for MS flair.  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 No new or contrast-enhancing lesions.  2. Unchanged moderate C5-6 spinal canal stenosis and mild bilateral  neural foraminal stenosis.  3. 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 No abnormal enhancement.  4. Mild spinal canal stenosis at L2-L3 with moderate right and mild  left neural foraminal stenosis.  5. Moderate left L5-S1 neural foraminal stenosis." and not suggestive active demyelination. PMH: MS with RLE weakness, HTN, HLD, DM2, BPH, GERD, gout, mitral valve ant leaflet prolapse.     Clinical Impressions Patient is currently requiring as high as Maximum assistance with basic ADLs, as well as  CG assist with bed mobility and up to Maximum assist with functional transfers to recliner to simulate BSC with use of RW.   Current level of function is significantly below patient's typical baseline.    During this evaluation, patient was limited by back pain when sitting EOB, RLE on generalized weakness, impaired activity tolerance, and cogntive deficits, all of which has the potential to impact patient's and/or caregivers' safety and independence during functional mobility, as well as performance for ADLs.    Patient lives alone with an Aide 2 hours a day M-F for IADLs and pt has a  daughter who is able to provide PRN  supervision and assistance and provides transportation at baseline.  Patient demonstrates good rehab potential, and should benefit from continued skilled occupational therapy services while in acute care to maximize safety, independence and quality of life at home. Patient will benefit from intensive inpatient follow-up therapy, >3 hours/day.   ?      If plan is discharge home, recommend the following:   Two people to help with walking and/or transfers;Supervision due to cognitive status;A lot of help with bathing/dressing/bathroom;Assistance with cooking/housework;Assist for transportation     Functional Status Assessment   Patient has had a recent decline in their functional status and demonstrates the ability to make significant improvements in function in a reasonable and predictable amount of time.     Equipment Recommendations    (defer)     Recommendations for Other Services   Rehab consult     Precautions/Restrictions   Precautions Precautions: Fall Recall of Precautions/Restrictions: Intact     Mobility Bed Mobility Overal bed mobility: Needs Assistance Bed Mobility: Supine to Sit     Supine to sit: Contact guard     General bed mobility comments: Increasde time and effort.    Transfers                          Balance Overall balance assessment: Needs assistance Sitting-balance support: Feet supported Sitting balance-Leahy Scale: Good     Standing balance support: Bilateral upper extremity supported, Reliant on assistive device for balance Standing balance-Leahy Scale:  Poor                             ADL either performed or assessed with clinical judgement   ADL Overall ADL's : Needs assistance/impaired   Eating/Feeding Details (indicate cue type and reason): Not observed but UE and cognitive functiona intact for self-feeding. Grooming: Wash/dry hands;Sitting;Set up   Upper  Body Bathing: Sitting;Set up;Cueing for sequencing;Supervision/ safety   Lower Body Bathing: Moderate assistance;Sitting/lateral leans   Upper Body Dressing : Supervision/safety;Set up;Sitting;Cueing for sequencing   Lower Body Dressing: Moderate assistance;Maximal assistance;Sitting/lateral leans Lower Body Dressing Details (indicate cue type and reason): Pt able to doff and don LT sock with increased time and effort and SBA. Pt unable to dress RLE despite several attempts. Pt reports plan to order [employer] slip on sneakers with dtr's help. May benefit from learning sock aid. Toilet Transfer: Moderate assistance;Maximal assistance;Rolling walker (2 wheels);Cueing for sequencing;Cueing for safety Toilet Transfer Details (indicate cue type and reason): Pt stood from elevated EOB to RW with Max Assist. Pt performed stand-step pivot to recliner with RW, Mod As, cues for sequencing and safety.   Toileting - Clothing Manipulation Details (indicate cue type and reason): Pt on condom catheter     Functional mobility during ADLs: Moderate assistance;Maximal assistance;Rolling walker (2 wheels);Cueing for safety;Cueing for sequencing       Vision   Vision Assessment?: No apparent visual deficits     Perception         Praxis         Pertinent Vitals/Pain Pain Assessment Pain Assessment: Faces Faces Pain Scale: Hurts even more Pain Location: Back while sitting EOB. Pain Descriptors / Indicators: Grimacing Pain Intervention(s): Repositioned, Monitored during session, Limited activity within patient's tolerance (Up to chair. Pt reported pain resolved)     Extremity/Trunk Assessment Upper Extremity Assessment Upper Extremity Assessment: Overall WFL for tasks assessed   Lower Extremity Assessment Lower Extremity Assessment: Defer to PT evaluation (ADL: PT unable to dress RLE due to weakness and pain. Could not perform figure 4 or reach down to RLE.)       Communication  Communication Communication: No apparent difficulties   Cognition Arousal: Alert Behavior During Therapy: WFL for tasks assessed/performed Cognition: Cognition impaired     Awareness: Intellectual awareness intact Memory impairment (select all impairments): Short-term memory Attention impairment (select first level of impairment): Sustained attention (Very easily distracted. Picks up phone in middle of sentence. Responds well to redirection.) Executive functioning impairment (select all impairments): Reasoning, Problem solving OT - Cognition Comments: Pt is tangnetial and somehat disorganized in his topics, but redirectable.                 Following commands: Intact       Cueing  General Comments   Cueing Techniques: Verbal cues;Visual cues      Exercises     Shoulder Instructions      Home Living Family/patient expects to be discharged to:: Private residence Living Arrangements: Alone Available Help at Discharge: Family;Available PRN/intermittently;Friend(s) Type of Home: House Home Access: Stairs to enter [employer] of Steps: 3 Entrance Stairs-Rails: Can reach both Home Layout: One level     Bathroom Shower/Tub: Producer, television/film/video: Standard Bathroom Accessibility: Yes   Home Equipment: Rollator (4 wheels);Shower seat;Grab bars - tub/shower;Lift chair;Cane - quad;Cane - single point;Other (comment)   Additional Comments: PCA 2 hours/day Mon-Fri      Prior Functioning/Environment Prior Level  of Function : Needs assist             Mobility Comments: mod I mobility in house with upright rollator ADLs Comments: Aide comes two hours (mon-frid) to assist with cooking, cleaning, and laundry. Per previous OT Evaluation, "Overall he has adapted specific ways of accomplishing his adls and has adapted his home and methods to facilitate independence."  Pt reported to this OT today that his daughter made some changes with bed setup at  home which is not working for him but he has not told her, as he does not want to hurt her feelings.    OT Problem List: Decreased strength;Decreased cognition;Decreased safety awareness;Decreased range of motion;Decreased activity tolerance;Impaired balance (sitting and/or standing);Pain   OT Treatment/Interventions: Self-care/ADL training;Therapeutic activities;Cognitive remediation/compensation;Therapeutic exercise;Neuromuscular education;Energy conservation;Patient/family education;Balance training;DME and/or AE instruction      OT Goals(Current goals can be found in the care plan section)   Acute Rehab OT Goals Patient Stated Goal: Pt asked x 2 but uinable to give congruent answer. Looking at phone. OT Goal Formulation: Patient unable to participate in goal setting Potential to Achieve Goals: Good ADL Goals Pt Will Perform Lower Body Bathing: with adaptive equipment;with supervision;sitting/lateral leans Pt Will Perform Lower Body Dressing: with adaptive equipment;with modified independence;sitting/lateral leans;sit to/from stand Pt Will Transfer to Toilet: with modified independence;ambulating Pt Will Perform Toileting - Clothing Manipulation and hygiene: with modified independence;sitting/lateral leans;sit to/from stand Additional ADL Goal #1: Patient will identify at least 3 fall prevention strategies to employ at home in order to maximize function and safety during ADLs and decrease caregiver burden while preventing possible injury and rehospitalization.   OT Frequency:  Min 3X/week    Co-evaluation              AM-PAC OT "6 Clicks" Daily Activity     Outcome Measure Help from another person eating meals?: None Help from another person taking care of personal grooming?: A Little Help from another person toileting, which includes using toliet, bedpan, or urinal?: A Lot Help from another person bathing (including washing, rinsing, drying)?: A Lot Help from another person  to put on and taking off regular upper body clothing?: A Little Help from another person to put on and taking off regular lower body clothing?: A Lot 6 Click Score: 16   End of Session Equipment Utilized During Treatment: Gait belt;Rolling walker (2 wheels) Nurse Communication: Other (comment) (Nursing assisted with setup of chair for chair alarm)  Activity Tolerance: Patient tolerated treatment well Patient left: in chair;with call bell/phone within reach;with chair alarm set  OT Visit Diagnosis: Unsteadiness on feet (R26.81);Other abnormalities of gait and mobility (R26.89);Other symptoms and signs involving the nervous system (R29.898);Pain;Muscle weakness (generalized) (M62.81) Pain - part of body:  (back)                Time: 5784-6962 OT Time Calculation (min): 29 min Charges:  OT General Charges $OT Visit: 1 Visit OT Evaluation $OT Eval Low Complexity: 1 Low OT Treatments $Self Care/Home Management : 8-22 mins  [facility], OT Acute Rehab Services Office: [phone] 10/07/2023   Asher Blade 10/07/2023, 10:46 AM

## 2023-10-07 NOTE — Hospital Course (Signed)
 James Gibbs is a 68 y.o. male with medical history significant for multiple sclerosis, T2DM, HTN, HLD, BPH who is admitted with generalized weakness.

## 2023-10-07 NOTE — Progress Notes (Signed)
 Pt informed that our pharmacy don't carry, dalfampridine  and dimethyl fumarate , both for MS.  Pt will have family bring this med to the hospital.

## 2023-10-07 NOTE — Progress Notes (Signed)
 Inpatient Rehab Admissions Coordinator:   Per therapy recommendations, patient was screened for CIR candidacy by Wandalee Gust, MS, CCC-SLP. At this time, work up is incomplete and it is not clear if he has medical necessity for CIR admission. Will rescreen when workup is complete. Pt. Please contact me with any questions.  Wandalee Gust, MS, CCC-SLP Rehab Admissions Coordinator  [phone] (celll) [phone] (office)

## 2023-10-07 NOTE — Progress Notes (Signed)
 [facility]                                                                               James Gibbs, is a 68 y.o. male, DOB - August 23, 1955, OZH:[id number] Admit date - 10/06/2023    Outpatient Primary MD for the patient is [facility], [facility]  LOS - 0  days    Brief summary   James Gibbs is a 68 y.o. male with medical history significant for multiple sclerosis, T2DM, HTN, HLD, BPH who is admitted with generalized weakness.   Assessment & Plan    Assessment and Plan:   Multiple sclerosis Generalized weakness:  - Follow MRI brain and MRI cervical spine, lumbar spine are negative for new lesions.  - Continue home dalfampridine  and Tecfidera  CK wnl.  Therapy eval recommending CIR VS SNF.    Type 2 diabetes: Continue with SSI.  CBG (last 3)  Recent Labs    10/07/23 0738 10/07/23 1144 10/07/23 1622  GLUCAP 108* 160* 106*      Hypokalemia: Replaced. Repeat levels in am.    Hypertension: Well controlled.  Resume home lisinopril , amlodipine , HCTZ.   Hyperlipidemia: Resume statin.    BPH: Continue Flomax  and Proscar .     Estimated body mass index is 31.67 kg/m as calculated from the following:   Height as of this encounter: 5\' 11"  (1.803 m).   Weight as of this encounter: 103 kg.  Code Status: full code.  DVT Prophylaxis:  enoxaparin  (LOVENOX ) injection 40 mg Start: 10/07/23 1000   Level of Care: Level of care: Telemetry Medical Family Communication: none at bedside   Disposition Plan:     Remains inpatient appropriate:  pending.   Procedures:  None.   Consultants:   None.   Antimicrobials:   Anti-infectives (From admission, onward)    None        Medications  Scheduled Meds:  allopurinol   300 mg Oral Daily   amLODipine   10 mg Oral Daily   baclofen   10 mg Oral Daily   dalfampridine   10 mg Oral Q12H   Dimethyl Fumarate   240 mg Oral BID   enoxaparin  (LOVENOX ) injection  40 mg Subcutaneous Q24H    finasteride   5 mg Oral Daily   hydrochlorothiazide   25 mg Oral Daily   insulin  aspart  0-9 Units Subcutaneous TID WC   lisinopril   40 mg Oral Daily   mirtazapine   7.5 mg Oral QHS   sodium chloride  flush  3 mL Intravenous Q12H   tamsulosin   0.8 mg Oral QPC supper   Continuous Infusions: PRN Meds:.acetaminophen  **OR** acetaminophen , bisacodyl , ketorolac , melatonin, mirtazapine , ondansetron  **OR** ondansetron  (ZOFRAN ) IV, senna-docusate    Subjective:   James Gibbs was seen and examined today.  He reports that he walked in the hallway, , weakness has improved a lot.   Objective:   Vitals:   10/07/23 0430 10/07/23 0740 10/07/23 1033 10/07/23 1142  BP: (!) 142/72 (!) 140/84  130/80  Pulse: 74 76 89 72  Resp: 18 19  18   Temp: 98.4 F (36.9 C) 97.7 F (36.5 C)  97.7 F (36.5 C)  TempSrc: Oral Oral  Oral  SpO2: 99% 99%  99%  Weight:      Height:        Intake/Output Summary (Last 24 hours) at 10/07/2023 1743 Last data filed at 10/07/2023 1445 Gross per 24 hour  Intake --  Output 500 ml  Net -500 ml   Filed Weights   10/06/23 2001  Weight: 103 kg     Exam General: Alert and oriented x 3, NAD Cardiovascular: S1 S2 auscultated, no murmurs, RRR Respiratory: Clear to auscultation bilaterally, no wheezing, rales or rhonchi Gastrointestinal: Soft, nontender, nondistended, + bowel sounds Ext: no pedal edema bilaterally Neuro: AAOx3,  Skin: No rashes Psych: Normal affect and demeanor, alert and oriented x3    Data Reviewed:  I have personally reviewed following labs and imaging studies   CBC Lab Results  Component Value Date   WBC 6.6 10/07/2023   RBC 4.35 10/07/2023   HGB 14.3 10/07/2023   HCT 41.0 10/07/2023   MCV 94.3 10/07/2023   MCH 32.9 10/07/2023   PLT 160 10/07/2023   MCHC 34.9 10/07/2023   RDW 13.7 10/07/2023   LYMPHSABS 1.4 07/28/2023   MONOABS 0.5 07/28/2023   EOSABS 0.0 07/28/2023   BASOSABS 0.0 07/28/2023     Last metabolic panel Lab  Results  Component Value Date   NA 139 10/07/2023   K 3.7 10/07/2023   CL 103 10/07/2023   CO2 27 10/07/2023   BUN 12 10/07/2023   CREATININE 0.99 10/07/2023   GLUCOSE 92 10/07/2023   GFRNONAA >60 10/07/2023   GFRAA >60 12/22/2018   CALCIUM  8.6 (L) 10/07/2023   PHOS 3.2 09/04/2022   PROT 6.6 10/06/2023   ALBUMIN 3.7 10/06/2023   BILITOT 1.1 10/06/2023   ALKPHOS 84 10/06/2023   AST 32 10/06/2023   ALT 58 (H) 10/06/2023   ANIONGAP 9 10/07/2023    CBG (last 3)  Recent Labs    10/07/23 0738 10/07/23 1144 10/07/23 1622  GLUCAP 108* 160* 106*      Coagulation Profile: No results for input(s): "INR", "PROTIME" in the last 168 hours.   Radiology Studies: MR Cervical Spine W and Wo Contrast Result Date: 10/07/2023 CLINICAL DATA:  Multiple sclerosis EXAM: MRI CERVICAL, THORACIC AND LUMBAR SPINE WITHOUT AND WITH CONTRAST TECHNIQUE: Multiplanar and multiecho pulse sequences of the cervical spine, to include the craniocervical junction and cervicothoracic junction, and thoracic and lumbar spine, were obtained without and with intravenous contrast. CONTRAST:  10mL GADAVIST  GADOBUTROL  1 MMOL/ML IV SOLN COMPARISON:  07/26/2023, 03/26/2023, 01/30/2022 FINDINGS: MRI CERVICAL SPINE FINDINGS Alignment: Physiologic. Vertebrae: No fracture, evidence of discitis, or bone lesion. Cord: There is generalized spinal cord volume loss. There are patchy foci of hyperintense T2-weighted signal within the spinal cord, unchanged. There are no contrast-enhancing lesions. Posterior Fossa, vertebral arteries, paraspinal tissues: Negative. Disc levels: C1-2: Unremarkable. C2-3: Small disc bulge. There is no spinal canal stenosis. No neural foraminal stenosis. C3-4: Small disc bulge and uncovertebral hypertrophy, unchanged. There is no spinal canal stenosis. No neural foraminal stenosis. C4-5: Disc space narrowing with left-greater-than-right uncovertebral hypertrophy. There is no spinal canal stenosis. Unchanged  mild left neural foraminal stenosis. C5-6: Large left subarticular disc osteophyte complex, unchanged. Unchanged moderate spinal canal stenosis. Unchanged mild bilateral neural foraminal stenosis. C6-7: Disc space narrowing with small bulge and endplate spurring. There is no spinal canal stenosis. No neural foraminal stenosis. C7-T1: Small disc osteophyte complex. There is no spinal canal stenosis. No neural foraminal stenosis. MRI THORACIC SPINE FINDINGS Alignment:  Physiologic. Vertebrae: No fracture, evidence of discitis, or bone lesion. Cord:  Normal signal and morphology. Paraspinal and other soft tissues: Small pleural effusions Disc levels: No spinal canal stenosis MRI LUMBAR SPINE FINDINGS Segmentation:  Standard. Alignment:  Physiologic. Vertebrae:  No fracture, evidence of discitis, or bone lesion. Conus medullaris and cauda equina: Conus extends to the L1 level. There is unchanged appearance of clumping of the distal cauda equina at the lumbosacral junction. No abnormal contrast enhancement. Paraspinal and other soft tissues: Fatty atrophy of the paraspinous muscles. Disc levels: L1-L2: Normal disc space and facet joints. No spinal canal stenosis. No neural foraminal stenosis. L2-L3: Disc space narrowing and small bulge with mild facet hypertrophy. Mild spinal canal stenosis. Moderate right and mild left neural foraminal stenosis. L3-L4: Small disc bulge with endplate spurring. No spinal canal stenosis. No neural foraminal stenosis. L4-L5: Small disc bulge with endplate spurring. No spinal canal stenosis. Mild bilateral neural foraminal stenosis. L5-S1: Small disc bulge. No spinal canal stenosis. Moderate left neural foraminal stenosis. Visualized sacrum: Normal. IMPRESSION: 1. 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 No new or contrast-enhancing lesions. 2. Unchanged moderate C5-6 spinal canal stenosis and mild bilateral neural foraminal stenosis. 3.  Unchanged clumping of the distal cauda equina at the lumbosacral junction. This may be due to mass effect from an arachnoid web or a sequela of arachnoiditis. No abnormal enhancement. 4. Mild spinal canal stenosis at L2-L3 with moderate right and mild left neural foraminal stenosis. 5. Moderate left L5-S1 neural foraminal stenosis. Electronically Signed   By: Juanetta Nordmann M.D.   On: 10/07/2023 03:53   MR THORACIC SPINE W WO CONTRAST Result Date: 10/07/2023 CLINICAL DATA:  Multiple sclerosis EXAM: MRI CERVICAL, THORACIC AND LUMBAR SPINE WITHOUT AND WITH CONTRAST TECHNIQUE: Multiplanar and multiecho pulse sequences of the cervical spine, to include the craniocervical junction and cervicothoracic junction, and thoracic and lumbar spine, were obtained without and with intravenous contrast. CONTRAST:  10mL GADAVIST  GADOBUTROL  1 MMOL/ML IV SOLN COMPARISON:  07/26/2023, 03/26/2023, 01/30/2022 FINDINGS: MRI CERVICAL SPINE FINDINGS Alignment: Physiologic. Vertebrae: No fracture, evidence of discitis, or bone lesion. Cord: There is generalized spinal cord volume loss. There are patchy foci of hyperintense T2-weighted signal within the spinal cord, unchanged. There are no contrast-enhancing lesions. Posterior Fossa, vertebral arteries, paraspinal tissues: Negative. Disc levels: C1-2: Unremarkable. C2-3: Small disc bulge. There is no spinal canal stenosis. No neural foraminal stenosis. C3-4: Small disc bulge and uncovertebral hypertrophy, unchanged. There is no spinal canal stenosis. No neural foraminal stenosis. C4-5: Disc space narrowing with left-greater-than-right uncovertebral hypertrophy. There is no spinal canal stenosis. Unchanged mild left neural foraminal stenosis. C5-6: Large left subarticular disc osteophyte complex, unchanged. Unchanged moderate spinal canal stenosis. Unchanged mild bilateral neural foraminal stenosis. C6-7: Disc space narrowing with small bulge and endplate spurring. There is no spinal canal  stenosis. No neural foraminal stenosis. C7-T1: Small disc osteophyte complex. There is no spinal canal stenosis. No neural foraminal stenosis. MRI THORACIC SPINE FINDINGS Alignment:  Physiologic. Vertebrae: No fracture, evidence of discitis, or bone lesion. Cord:  Normal signal and morphology. Paraspinal and other soft tissues: Small pleural effusions Disc levels: No spinal canal stenosis MRI LUMBAR SPINE FINDINGS Segmentation:  Standard. Alignment:  Physiologic. Vertebrae:  No fracture, evidence of discitis, or bone lesion. Conus medullaris and cauda equina: Conus extends to the L1 level. There is unchanged appearance of clumping of the distal cauda equina at the lumbosacral junction. No abnormal contrast enhancement. Paraspinal and other soft tissues: Fatty atrophy of the paraspinous muscles. Disc levels: L1-L2: Normal disc  space and facet joints. No spinal canal stenosis. No neural foraminal stenosis. L2-L3: Disc space narrowing and small bulge with mild facet hypertrophy. Mild spinal canal stenosis. Moderate right and mild left neural foraminal stenosis. L3-L4: Small disc bulge with endplate spurring. No spinal canal stenosis. No neural foraminal stenosis. L4-L5: Small disc bulge with endplate spurring. No spinal canal stenosis. Mild bilateral neural foraminal stenosis. L5-S1: Small disc bulge. No spinal canal stenosis. Moderate left neural foraminal stenosis. Visualized sacrum: Normal. IMPRESSION: 1. 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 No new or contrast-enhancing lesions. 2. Unchanged moderate C5-6 spinal canal stenosis and mild bilateral neural foraminal stenosis. 3. Unchanged clumping of the distal cauda equina at the lumbosacral junction. This may be due to mass effect from an arachnoid web or a sequela of arachnoiditis. No abnormal enhancement. 4. Mild spinal canal stenosis at L2-L3 with moderate right and mild left neural foraminal stenosis. 5.  Moderate left L5-S1 neural foraminal stenosis. Electronically Signed   By: Juanetta Nordmann M.D.   On: 10/07/2023 03:53   MR Lumbar Spine W Wo Contrast Result Date: 10/07/2023 CLINICAL DATA:  Multiple sclerosis EXAM: MRI CERVICAL, THORACIC AND LUMBAR SPINE WITHOUT AND WITH CONTRAST TECHNIQUE: Multiplanar and multiecho pulse sequences of the cervical spine, to include the craniocervical junction and cervicothoracic junction, and thoracic and lumbar spine, were obtained without and with intravenous contrast. CONTRAST:  10mL GADAVIST  GADOBUTROL  1 MMOL/ML IV SOLN COMPARISON:  07/26/2023, 03/26/2023, 01/30/2022 FINDINGS: MRI CERVICAL SPINE FINDINGS Alignment: Physiologic. Vertebrae: No fracture, evidence of discitis, or bone lesion. Cord: There is generalized spinal cord volume loss. There are patchy foci of hyperintense T2-weighted signal within the spinal cord, unchanged. There are no contrast-enhancing lesions. Posterior Fossa, vertebral arteries, paraspinal tissues: Negative. Disc levels: C1-2: Unremarkable. C2-3: Small disc bulge. There is no spinal canal stenosis. No neural foraminal stenosis. C3-4: Small disc bulge and uncovertebral hypertrophy, unchanged. There is no spinal canal stenosis. No neural foraminal stenosis. C4-5: Disc space narrowing with left-greater-than-right uncovertebral hypertrophy. There is no spinal canal stenosis. Unchanged mild left neural foraminal stenosis. C5-6: Large left subarticular disc osteophyte complex, unchanged. Unchanged moderate spinal canal stenosis. Unchanged mild bilateral neural foraminal stenosis. C6-7: Disc space narrowing with small bulge and endplate spurring. There is no spinal canal stenosis. No neural foraminal stenosis. C7-T1: Small disc osteophyte complex. There is no spinal canal stenosis. No neural foraminal stenosis. MRI THORACIC SPINE FINDINGS Alignment:  Physiologic. Vertebrae: No fracture, evidence of discitis, or bone lesion. Cord:  Normal signal and  morphology. Paraspinal and other soft tissues: Small pleural effusions Disc levels: No spinal canal stenosis MRI LUMBAR SPINE FINDINGS Segmentation:  Standard. Alignment:  Physiologic. Vertebrae:  No fracture, evidence of discitis, or bone lesion. Conus medullaris and cauda equina: Conus extends to the L1 level. There is unchanged appearance of clumping of the distal cauda equina at the lumbosacral junction. No abnormal contrast enhancement. Paraspinal and other soft tissues: Fatty atrophy of the paraspinous muscles. Disc levels: L1-L2: Normal disc space and facet joints. No spinal canal stenosis. No neural foraminal stenosis. L2-L3: Disc space narrowing and small bulge with mild facet hypertrophy. Mild spinal canal stenosis. Moderate right and mild left neural foraminal stenosis. L3-L4: Small disc bulge with endplate spurring. No spinal canal stenosis. No neural foraminal stenosis. L4-L5: Small disc bulge with endplate spurring. No spinal canal stenosis. Mild bilateral neural foraminal stenosis. L5-S1: Small disc bulge. No spinal canal stenosis. Moderate left neural foraminal stenosis. Visualized sacrum: Normal. IMPRESSION: 1.  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 No new or contrast-enhancing lesions. 2. Unchanged moderate C5-6 spinal canal stenosis and mild bilateral neural foraminal stenosis. 3. Unchanged clumping of the distal cauda equina at the lumbosacral junction. This may be due to mass effect from an arachnoid web or a sequela of arachnoiditis. No abnormal enhancement. 4. Mild spinal canal stenosis at L2-L3 with moderate right and mild left neural foraminal stenosis. 5. Moderate left L5-S1 neural foraminal stenosis. Electronically Signed   By: Juanetta Nordmann M.D.   On: 10/07/2023 03:53   MR Brain W and Wo Contrast Result Date: 10/07/2023 CLINICAL DATA:  Multiple sclerosis EXAM: MRI HEAD WITHOUT AND WITH CONTRAST TECHNIQUE: Multiplanar, multiecho pulse  sequences of the brain and surrounding structures were obtained without and with intravenous contrast. CONTRAST:  10mL GADAVIST  GADOBUTROL  1 MMOL/ML IV SOLN COMPARISON:  07/26/2023 FINDINGS: Brain: No acute infarct, mass effect or extra-axial collection. No acute or chronic hemorrhage. There is generalized volume loss. There is confluent hyperintense T2-weighted signal within the bihemispheric white matter. The distribution of the white matter lesions is unchanged. The midline structures are normal. There is no abnormal contrast enhancement. Vascular: Normal flow voids. Skull and upper cervical spine: Normal calvarium and skull base. Visualized upper cervical spine and soft tissues are normal. Sinuses/Orbits:No paranasal sinus fluid levels or advanced mucosal thickening. No mastoid or middle ear effusion. Normal orbits. IMPRESSION: Unchanged pattern of severe white matter disease, compatible with multiple sclerosis. No active demyelination. Electronically Signed   By: Juanetta Nordmann M.D.   On: 10/07/2023 03:33   DG Pelvis 1-2 Views Result Date: 10/06/2023 CLINICAL DATA:  Low back pain EXAM: PELVIS - 1-2 VIEW COMPARISON:  CT 07/26/2023 FINDINGS: SI joints are patent. Pubic symphysis and rami appear intact. No fracture or malalignment. Minimal hip degenerative change IMPRESSION: Minimal hip degenerative change. Electronically Signed   By: Esmeralda Hedge M.D.   On: 10/06/2023 23:04   DG Lumbar Spine Complete Result Date: 10/06/2023 CLINICAL DATA:  Low back pain EXAM: LUMBAR SPINE - COMPLETE 4+ VIEW COMPARISON:  05/10/2013, MRI 03/26/2023 FINDINGS: Lumbar alignment within normal limits. Vertebral body heights are maintained. Multilevel degenerative changes with moderate disc space narrowing at L2-L3 and L3-L4 with moderate severe disc space narrowing at L4-L5. Multilevel degenerative osteophytes. Prominent lower lumbar facet degenerative changes. Aortic atherosclerosis. IMPRESSION: Multilevel degenerative changes,  worst at L4-L5. Electronically Signed   By: Esmeralda Hedge M.D.   On: 10/06/2023 23:03       Feliciana Horn M.D. [facility] 10/07/2023, 5:43 PM  Available via Epic secure chat 7am-7pm After 7 pm, please refer to night coverage provider listed on amion.

## 2023-10-07 NOTE — Evaluation (Signed)
 Physical Therapy Evaluation Patient Details Name: James Gibbs MRN: [id number] DOB: February 12, 1956 Today's Date: 10/07/2023  History of Present Illness  Pt is a 68 y.o. male admitted to [facility] on 10/06/23 feeling generally weak throughout his whole body.  He has had significant difficulty getting in and out of bed.  He says when he does get in bed he has a lot of trouble getting out.  He says he does have physical therapy and home aide help at home. Admission concerning for MS flair.  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 No new or contrast-enhancing lesions.  2. Unchanged moderate C5-6 spinal canal stenosis and mild bilateral  neural foraminal stenosis.  3. 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 No abnormal enhancement.  4. Mild spinal canal stenosis at L2-L3 with moderate right and mild  left neural foraminal stenosis.  5. Moderate left L5-S1 neural foraminal stenosis." and not suggestive active demyelination. PMH: MS with RLE weakness, HTN, HLD, DM2, BPH, GERD, gout, mitral valve ant leaflet prolapse.  Clinical Impression  Pt presents with admitting diagnosis above. Pt today was able to ambulate short distance in hallway with upright rollator Min A. PTA pt reports he was Mod I with upright rollator working with HHPT and has PCA Mon-Fri for 2 hours a day. Patient will benefit from intensive inpatient follow-up therapy, >3 hours/day. PT will continue to follow.         If plan is discharge home, recommend the following: A lot of help with walking and/or transfers;A little help with bathing/dressing/bathroom;Assistance with cooking/housework;Direct supervision/assist for medications management;Assist for transportation;Help with stairs or ramp for entrance   Can travel by private vehicle        Equipment  Recommendations Wheelchair (measurements PT);Wheelchair cushion (measurements PT)  Recommendations for Other Services  Rehab consult    Functional Status Assessment Patient has had a recent decline in their functional status and demonstrates the ability to make significant improvements in function in a reasonable and predictable amount of time.     Precautions / Restrictions Precautions Precautions: Fall Recall of Precautions/Restrictions: Intact Restrictions Weight Bearing Restrictions Per Provider Order: No      Mobility  Bed Mobility Overal bed mobility: Needs Assistance Bed Mobility: Supine to Sit, Sit to Supine     Supine to sit: Supervision Sit to supine: Contact guard assist   General bed mobility comments: Increased time and effort however no physical assist provided.    Transfers Overall transfer level: Needs assistance Equipment used: Rollator (4 wheels) (Upright rollator) Transfers: Sit to/from Stand Sit to Stand: Mod assist           General transfer comment: Mod A to power up. Good use of rollator brakes.    Ambulation/Gait Ambulation/Gait assistance: Min assist Gait Distance (Feet): 50 Feet Assistive device: Rollator (4 wheels) (Upright rollator) Gait Pattern/deviations: Trunk flexed, Decreased dorsiflexion - right, Knee flexed in stance - left, Knee flexed in stance - right, Step-through pattern, Decreased stride length, Narrow base of support Gait velocity: decreased     General Gait Details: Pt noted with R foot drop when ambulating. no LOB noted however pt noted to fatigue quickly.  Stairs            Wheelchair Mobility     Tilt Bed    Modified Rankin (Stroke Patients Only)  Balance Overall balance assessment: Needs assistance Sitting-balance support: Feet supported Sitting balance-Leahy Scale: Good     Standing balance support: Bilateral upper extremity supported, Reliant on assistive device for balance Standing  balance-Leahy Scale: Poor                               Pertinent Vitals/Pain Pain Assessment Pain Assessment: Faces Faces Pain Scale: Hurts even more Pain Location: Back while sitting EOB. Pain Descriptors / Indicators: Grimacing Pain Intervention(s): Monitored during session, Limited activity within patient's tolerance, Repositioned    Home Living Family/patient expects to be discharged to:: Private residence Living Arrangements: Alone Available Help at Discharge: Family;Available PRN/intermittently;Friend(s) Type of Home: House Home Access: Stairs to enter Entrance Stairs-Rails: Can reach both Entrance Stairs-Number of Steps: 3   Home Layout: One level Home Equipment: Rollator (4 wheels);Shower seat;Grab bars - tub/shower;Lift chair;Cane - quad;Cane - single point;Other (comment) Additional Comments: PCA 2 hours/day Mon-Fri    Prior Function Prior Level of Function : Needs assist             Mobility Comments: mod I mobility in house with upright rollator ADLs Comments: Aide comes two hours (mon-frid) to assist with cooking, cleaning, and laundry. Per previous OT Evaluation, "Overall he has adapted specific ways of accomplishing his adls and has adapted his home and methods to facilitate independence."  Pt reported to this OT today that his daughter made some changes with bed setup at home which is not working for him but he has not told her, as he does not want to hurt her feelings.     Extremity/Trunk Assessment   Upper Extremity Assessment Upper Extremity Assessment: Overall WFL for tasks assessed    Lower Extremity Assessment Lower Extremity Assessment: Generalized weakness    Cervical / Trunk Assessment Cervical / Trunk Assessment: Kyphotic  Communication   Communication Communication: No apparent difficulties    Cognition Arousal: Alert Behavior During Therapy: WFL for tasks assessed/performed   PT - Cognitive impairments: No apparent  impairments                         Following commands: Intact       Cueing Cueing Techniques: Verbal cues, Visual cues     General Comments General comments (skin integrity, edema, etc.): VSS    Exercises     Assessment/Plan    PT Assessment Patient needs continued PT services  PT Problem List Decreased strength;Decreased range of motion;Decreased activity tolerance;Decreased balance;Decreased mobility;Decreased coordination;Decreased cognition;Decreased knowledge of use of DME;Decreased safety awareness;Decreased knowledge of precautions;Cardiopulmonary status limiting activity       PT Treatment Interventions DME instruction;Gait training;Stair training;Functional mobility training;Therapeutic activities;Therapeutic exercise;Balance training;Neuromuscular re-education;Patient/family education    PT Goals (Current goals can be found in the Care Plan section)  Acute Rehab PT Goals Patient Stated Goal: to get better PT Goal Formulation: With patient Time For Goal Achievement: 10/21/23 Potential to Achieve Goals: Good    Frequency Min 3X/week     Co-evaluation               AM-PAC PT "6 Clicks" Mobility  Outcome Measure Help needed turning from your back to your side while in a flat bed without using bedrails?: A Little Help needed moving from lying on your back to sitting on the side of a flat bed without using bedrails?: A Little Help needed moving to and from a bed to a  chair (including a wheelchair)?: A Little Help needed standing up from a chair using your arms (e.g., wheelchair or bedside chair)?: A Lot Help needed to walk in hospital room?: A Lot Help needed climbing 3-5 steps with a railing? : Total 6 Click Score: 14    End of Session Equipment Utilized During Treatment: Gait belt Activity Tolerance: Patient tolerated treatment well Patient left: in bed;with call bell/phone within reach;with bed alarm set Nurse Communication: Mobility  status PT Visit Diagnosis: Other abnormalities of gait and mobility (R26.89)    Time: 1610-9604 PT Time Calculation (min) (ACUTE ONLY): 36 min   Charges:   PT Evaluation $PT Eval Moderate Complexity: 1 Mod PT Treatments $Gait Training: 8-22 mins PT General Charges $$ ACUTE PT VISIT: 1 Visit         Rodgers Clack, PT, DPT Acute Rehab Services [phone]   Nathin Saran 10/07/2023, 2:21 PM

## 2023-10-07 NOTE — ED Notes (Signed)
Report given to 3w rn

## 2023-10-07 NOTE — ED Notes (Signed)
 Called MRI to inform them that pt should go to his inpatient bed when MRI is done.

## 2023-10-08 DIAGNOSIS — R531 Weakness: Secondary | ICD-10-CM | POA: Diagnosis not present

## 2023-10-08 DIAGNOSIS — E1159 Type 2 diabetes mellitus with other circulatory complications: Secondary | ICD-10-CM | POA: Diagnosis not present

## 2023-10-08 DIAGNOSIS — G35 Multiple sclerosis: Secondary | ICD-10-CM | POA: Diagnosis not present

## 2023-10-08 DIAGNOSIS — E1169 Type 2 diabetes mellitus with other specified complication: Secondary | ICD-10-CM | POA: Diagnosis not present

## 2023-10-08 LAB — GLUCOSE, CAPILLARY
Glucose-Capillary: 109 mg/dL — ABNORMAL HIGH (ref 70–99)
Glucose-Capillary: 179 mg/dL — ABNORMAL HIGH (ref 70–99)
Glucose-Capillary: 136 mg/dL — ABNORMAL HIGH (ref 70–99)
Glucose-Capillary: 146 mg/dL — ABNORMAL HIGH (ref 70–99)

## 2023-10-08 NOTE — Progress Notes (Signed)
 [facility]                                                                               Auther Chapla, is a 68 y.o. male, DOB - 10/17/1955, ZOX:[id number] Admit date - 10/06/2023    Outpatient Primary MD for the patient is Clinic, [facility]  LOS - 0  days    Brief summary   James Gibbs is a 68 y.o. male with medical history significant for multiple sclerosis, T2DM, HTN, HLD, BPH who is admitted with generalized weakness.   Assessment & Plan    Assessment and Plan:   Multiple sclerosis Generalized weakness:  - Follow MRI brain and MRI cervical spine, lumbar spine are negative for new lesions.  - Continue home dalfampridine  and Tecfidera  CK wnl. Spoke to neurology , since no new enhancement or lesions, recommend outpatient follow up with Neurology.  Therapy eval recommending CIR VS SNF. Patient reports his weakness has improved.  He is working with PT.    Type 2 diabetes: Continue with SSI.  CBG (last 3)  Recent Labs    10/07/23 2109 10/08/23 0622 10/08/23 1115  GLUCAP 154* 109* 179*      Hypokalemia: Replaced. Repeat levels in am.    Hypertension: Well controlled.  Resume home lisinopril , amlodipine , HCTZ.   Hyperlipidemia: Resume statin.    BPH: Continue Flomax  and Proscar .     Estimated body mass index is 31.67 kg/m as calculated from the following:   Height as of this encounter: 5\' 11"  (1.803 m).   Weight as of this encounter: 103 kg.  Code Status: full code.  DVT Prophylaxis:  enoxaparin  (LOVENOX ) injection 40 mg Start: 10/07/23 1000   Level of Care: Level of care: Telemetry Medical Family Communication: none at bedside   Disposition Plan:     Remains inpatient appropriate:  pending.   Procedures:  None.   Consultants:   None.   Antimicrobials:   Anti-infectives (From admission, onward)    None        Medications  Scheduled Meds:  allopurinol   300 mg Oral Daily   amLODipine   10 mg Oral Daily    atorvastatin   80 mg Oral QHS   baclofen   10 mg Oral Daily   dalfampridine   10 mg Oral Q12H   Dimethyl Fumarate   240 mg Oral BID   enoxaparin  (LOVENOX ) injection  40 mg Subcutaneous Q24H   finasteride   5 mg Oral Daily   hydrochlorothiazide   25 mg Oral Daily   insulin  aspart  0-9 Units Subcutaneous TID WC   lisinopril   40 mg Oral Daily   mirtazapine   7.5 mg Oral QHS   sodium chloride  flush  3 mL Intravenous Q12H   tamsulosin   0.8 mg Oral QPC supper   Continuous Infusions: PRN Meds:.acetaminophen  **OR** acetaminophen , bisacodyl , ketorolac , melatonin, mirtazapine , ondansetron  **OR** ondansetron  (ZOFRAN ) IV, senna-docusate    Subjective:   James Gibbs was seen and examined today.  No new complaints today.   Objective:   Vitals:   10/07/23 1949 10/07/23 2352 10/08/23 0340 10/08/23 0733  BP: 125/80 137/81 (!) 141/79 (!) 136/92  Pulse: 70 73 74 70  Resp: 18 18 16  19  Temp: 97.8 F (36.6 C) 98 F (36.7 C) (!) 97.5 F (36.4 C) 97.6 F (36.4 C)  TempSrc: Oral Oral Oral Oral  SpO2: 97% 94% 94% 100%  Weight:      Height:        Intake/Output Summary (Last 24 hours) at 10/08/2023 1424 Last data filed at 10/08/2023 0629 Gross per 24 hour  Intake --  Output 1800 ml  Net -1800 ml   Filed Weights   10/06/23 2001  Weight: 103 kg     Exam General exam: Appears calm and comfortable  Respiratory system: Clear to auscultation. Respiratory effort normal. Cardiovascular system: S1 & S2 heard, RRR. No JVD,  Gastrointestinal system: Abdomen is nondistended, soft and nontender.  Central nervous system: Alert and oriented.    Data Reviewed:  I have personally reviewed following labs and imaging studies   CBC Lab Results  Component Value Date   WBC 6.6 10/07/2023   RBC 4.35 10/07/2023   HGB 14.3 10/07/2023   HCT 41.0 10/07/2023   MCV 94.3 10/07/2023   MCH 32.9 10/07/2023   PLT 160 10/07/2023   MCHC 34.9 10/07/2023   RDW 13.7 10/07/2023   LYMPHSABS 1.4 07/28/2023    MONOABS 0.5 07/28/2023   EOSABS 0.0 07/28/2023   BASOSABS 0.0 07/28/2023     Last metabolic panel Lab Results  Component Value Date   NA 139 10/07/2023   K 3.7 10/07/2023   CL 103 10/07/2023   CO2 27 10/07/2023   BUN 12 10/07/2023   CREATININE 0.99 10/07/2023   GLUCOSE 92 10/07/2023   GFRNONAA >60 10/07/2023   GFRAA >60 12/22/2018   CALCIUM  8.6 (L) 10/07/2023   PHOS 3.2 09/04/2022   PROT 6.6 10/06/2023   ALBUMIN 3.7 10/06/2023   BILITOT 1.1 10/06/2023   ALKPHOS 84 10/06/2023   AST 32 10/06/2023   ALT 58 (H) 10/06/2023   ANIONGAP 9 10/07/2023    CBG (last 3)  Recent Labs    10/07/23 2109 10/08/23 0622 10/08/23 1115  GLUCAP 154* 109* 179*      Coagulation Profile: No results for input(s): "INR", "PROTIME" in the last 168 hours.   Radiology Studies: MR Cervical Spine W and Wo Contrast Result Date: 10/07/2023 CLINICAL DATA:  Multiple sclerosis EXAM: MRI CERVICAL, THORACIC AND LUMBAR SPINE WITHOUT AND WITH CONTRAST TECHNIQUE: Multiplanar and multiecho pulse sequences of the cervical spine, to include the craniocervical junction and cervicothoracic junction, and thoracic and lumbar spine, were obtained without and with intravenous contrast. CONTRAST:  10mL GADAVIST  GADOBUTROL  1 MMOL/ML IV SOLN COMPARISON:  07/26/2023, 03/26/2023, 01/30/2022 FINDINGS: MRI CERVICAL SPINE FINDINGS Alignment: Physiologic. Vertebrae: No fracture, evidence of discitis, or bone lesion. Cord: There is generalized spinal cord volume loss. There are patchy foci of hyperintense T2-weighted signal within the spinal cord, unchanged. There are no contrast-enhancing lesions. Posterior Fossa, vertebral arteries, paraspinal tissues: Negative. Disc levels: C1-2: Unremarkable. C2-3: Small disc bulge. There is no spinal canal stenosis. No neural foraminal stenosis. C3-4: Small disc bulge and uncovertebral hypertrophy, unchanged. There is no spinal canal stenosis. No neural foraminal stenosis. C4-5: Disc space  narrowing with left-greater-than-right uncovertebral hypertrophy. There is no spinal canal stenosis. Unchanged mild left neural foraminal stenosis. C5-6: Large left subarticular disc osteophyte complex, unchanged. Unchanged moderate spinal canal stenosis. Unchanged mild bilateral neural foraminal stenosis. C6-7: Disc space narrowing with small bulge and endplate spurring. There is no spinal canal stenosis. No neural foraminal stenosis. C7-T1: Small disc osteophyte complex. There is no spinal canal stenosis. No neural foraminal  stenosis. MRI THORACIC SPINE FINDINGS Alignment:  Physiologic. Vertebrae: No fracture, evidence of discitis, or bone lesion. Cord:  Normal signal and morphology. Paraspinal and other soft tissues: Small pleural effusions Disc levels: No spinal canal stenosis MRI LUMBAR SPINE FINDINGS Segmentation:  Standard. Alignment:  Physiologic. Vertebrae:  No fracture, evidence of discitis, or bone lesion. Conus medullaris and cauda equina: Conus extends to the L1 level. There is unchanged appearance of clumping of the distal cauda equina at the lumbosacral junction. No abnormal contrast enhancement. Paraspinal and other soft tissues: Fatty atrophy of the paraspinous muscles. Disc levels: L1-L2: Normal disc space and facet joints. No spinal canal stenosis. No neural foraminal stenosis. L2-L3: Disc space narrowing and small bulge with mild facet hypertrophy. Mild spinal canal stenosis. Moderate right and mild left neural foraminal stenosis. L3-L4: Small disc bulge with endplate spurring. No spinal canal stenosis. No neural foraminal stenosis. L4-L5: Small disc bulge with endplate spurring. No spinal canal stenosis. Mild bilateral neural foraminal stenosis. L5-S1: Small disc bulge. No spinal canal stenosis. Moderate left neural foraminal stenosis. Visualized sacrum: Normal. IMPRESSION: 1. 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 No new or contrast-enhancing  lesions. 2. Unchanged moderate C5-6 spinal canal stenosis and mild bilateral neural foraminal stenosis. 3. Unchanged clumping of the distal cauda equina at the lumbosacral junction. This may be due to mass effect from an arachnoid web or a sequela of arachnoiditis. No abnormal enhancement. 4. Mild spinal canal stenosis at L2-L3 with moderate right and mild left neural foraminal stenosis. 5. Moderate left L5-S1 neural foraminal stenosis. Electronically Signed   By: Juanetta Nordmann M.D.   On: 10/07/2023 03:53   MR THORACIC SPINE W WO CONTRAST Result Date: 10/07/2023 CLINICAL DATA:  Multiple sclerosis EXAM: MRI CERVICAL, THORACIC AND LUMBAR SPINE WITHOUT AND WITH CONTRAST TECHNIQUE: Multiplanar and multiecho pulse sequences of the cervical spine, to include the craniocervical junction and cervicothoracic junction, and thoracic and lumbar spine, were obtained without and with intravenous contrast. CONTRAST:  10mL GADAVIST  GADOBUTROL  1 MMOL/ML IV SOLN COMPARISON:  07/26/2023, 03/26/2023, 01/30/2022 FINDINGS: MRI CERVICAL SPINE FINDINGS Alignment: Physiologic. Vertebrae: No fracture, evidence of discitis, or bone lesion. Cord: There is generalized spinal cord volume loss. There are patchy foci of hyperintense T2-weighted signal within the spinal cord, unchanged. There are no contrast-enhancing lesions. Posterior Fossa, vertebral arteries, paraspinal tissues: Negative. Disc levels: C1-2: Unremarkable. C2-3: Small disc bulge. There is no spinal canal stenosis. No neural foraminal stenosis. C3-4: Small disc bulge and uncovertebral hypertrophy, unchanged. There is no spinal canal stenosis. No neural foraminal stenosis. C4-5: Disc space narrowing with left-greater-than-right uncovertebral hypertrophy. There is no spinal canal stenosis. Unchanged mild left neural foraminal stenosis. C5-6: Large left subarticular disc osteophyte complex, unchanged. Unchanged moderate spinal canal stenosis. Unchanged mild bilateral neural  foraminal stenosis. C6-7: Disc space narrowing with small bulge and endplate spurring. There is no spinal canal stenosis. No neural foraminal stenosis. C7-T1: Small disc osteophyte complex. There is no spinal canal stenosis. No neural foraminal stenosis. MRI THORACIC SPINE FINDINGS Alignment:  Physiologic. Vertebrae: No fracture, evidence of discitis, or bone lesion. Cord:  Normal signal and morphology. Paraspinal and other soft tissues: Small pleural effusions Disc levels: No spinal canal stenosis MRI LUMBAR SPINE FINDINGS Segmentation:  Standard. Alignment:  Physiologic. Vertebrae:  No fracture, evidence of discitis, or bone lesion. Conus medullaris and cauda equina: Conus extends to the L1 level. There is unchanged appearance of clumping of the distal cauda equina at the lumbosacral junction. No  abnormal contrast enhancement. Paraspinal and other soft tissues: Fatty atrophy of the paraspinous muscles. Disc levels: L1-L2: Normal disc space and facet joints. No spinal canal stenosis. No neural foraminal stenosis. L2-L3: Disc space narrowing and small bulge with mild facet hypertrophy. Mild spinal canal stenosis. Moderate right and mild left neural foraminal stenosis. L3-L4: Small disc bulge with endplate spurring. No spinal canal stenosis. No neural foraminal stenosis. L4-L5: Small disc bulge with endplate spurring. No spinal canal stenosis. Mild bilateral neural foraminal stenosis. L5-S1: Small disc bulge. No spinal canal stenosis. Moderate left neural foraminal stenosis. Visualized sacrum: Normal. IMPRESSION: 1. 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 No new or contrast-enhancing lesions. 2. Unchanged moderate C5-6 spinal canal stenosis and mild bilateral neural foraminal stenosis. 3. Unchanged clumping of the distal cauda equina at the lumbosacral junction. This may be due to mass effect from an arachnoid web or a sequela of arachnoiditis. No abnormal  enhancement. 4. Mild spinal canal stenosis at L2-L3 with moderate right and mild left neural foraminal stenosis. 5. Moderate left L5-S1 neural foraminal stenosis. Electronically Signed   By: Juanetta Nordmann M.D.   On: 10/07/2023 03:53   MR Lumbar Spine W Wo Contrast Result Date: 10/07/2023 CLINICAL DATA:  Multiple sclerosis EXAM: MRI CERVICAL, THORACIC AND LUMBAR SPINE WITHOUT AND WITH CONTRAST TECHNIQUE: Multiplanar and multiecho pulse sequences of the cervical spine, to include the craniocervical junction and cervicothoracic junction, and thoracic and lumbar spine, were obtained without and with intravenous contrast. CONTRAST:  10mL GADAVIST  GADOBUTROL  1 MMOL/ML IV SOLN COMPARISON:  07/26/2023, 03/26/2023, 01/30/2022 FINDINGS: MRI CERVICAL SPINE FINDINGS Alignment: Physiologic. Vertebrae: No fracture, evidence of discitis, or bone lesion. Cord: There is generalized spinal cord volume loss. There are patchy foci of hyperintense T2-weighted signal within the spinal cord, unchanged. There are no contrast-enhancing lesions. Posterior Fossa, vertebral arteries, paraspinal tissues: Negative. Disc levels: C1-2: Unremarkable. C2-3: Small disc bulge. There is no spinal canal stenosis. No neural foraminal stenosis. C3-4: Small disc bulge and uncovertebral hypertrophy, unchanged. There is no spinal canal stenosis. No neural foraminal stenosis. C4-5: Disc space narrowing with left-greater-than-right uncovertebral hypertrophy. There is no spinal canal stenosis. Unchanged mild left neural foraminal stenosis. C5-6: Large left subarticular disc osteophyte complex, unchanged. Unchanged moderate spinal canal stenosis. Unchanged mild bilateral neural foraminal stenosis. C6-7: Disc space narrowing with small bulge and endplate spurring. There is no spinal canal stenosis. No neural foraminal stenosis. C7-T1: Small disc osteophyte complex. There is no spinal canal stenosis. No neural foraminal stenosis. MRI THORACIC SPINE FINDINGS  Alignment:  Physiologic. Vertebrae: No fracture, evidence of discitis, or bone lesion. Cord:  Normal signal and morphology. Paraspinal and other soft tissues: Small pleural effusions Disc levels: No spinal canal stenosis MRI LUMBAR SPINE FINDINGS Segmentation:  Standard. Alignment:  Physiologic. Vertebrae:  No fracture, evidence of discitis, or bone lesion. Conus medullaris and cauda equina: Conus extends to the L1 level. There is unchanged appearance of clumping of the distal cauda equina at the lumbosacral junction. No abnormal contrast enhancement. Paraspinal and other soft tissues: Fatty atrophy of the paraspinous muscles. Disc levels: L1-L2: Normal disc space and facet joints. No spinal canal stenosis. No neural foraminal stenosis. L2-L3: Disc space narrowing and small bulge with mild facet hypertrophy. Mild spinal canal stenosis. Moderate right and mild left neural foraminal stenosis. L3-L4: Small disc bulge with endplate spurring. No spinal canal stenosis. No neural foraminal stenosis. L4-L5: Small disc bulge with endplate spurring. No spinal canal stenosis. Mild bilateral neural foraminal  stenosis. L5-S1: Small disc bulge. No spinal canal stenosis. Moderate left neural foraminal stenosis. Visualized sacrum: Normal. IMPRESSION: 1. 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 No new or contrast-enhancing lesions. 2. Unchanged moderate C5-6 spinal canal stenosis and mild bilateral neural foraminal stenosis. 3. Unchanged clumping of the distal cauda equina at the lumbosacral junction. This may be due to mass effect from an arachnoid web or a sequela of arachnoiditis. No abnormal enhancement. 4. Mild spinal canal stenosis at L2-L3 with moderate right and mild left neural foraminal stenosis. 5. Moderate left L5-S1 neural foraminal stenosis. Electronically Signed   By: Juanetta Nordmann M.D.   On: 10/07/2023 03:53   MR Brain W and Wo Contrast Result Date:  10/07/2023 CLINICAL DATA:  Multiple sclerosis EXAM: MRI HEAD WITHOUT AND WITH CONTRAST TECHNIQUE: Multiplanar, multiecho pulse sequences of the brain and surrounding structures were obtained without and with intravenous contrast. CONTRAST:  10mL GADAVIST  GADOBUTROL  1 MMOL/ML IV SOLN COMPARISON:  07/26/2023 FINDINGS: Brain: No acute infarct, mass effect or extra-axial collection. No acute or chronic hemorrhage. There is generalized volume loss. There is confluent hyperintense T2-weighted signal within the bihemispheric white matter. The distribution of the white matter lesions is unchanged. The midline structures are normal. There is no abnormal contrast enhancement. Vascular: Normal flow voids. Skull and upper cervical spine: Normal calvarium and skull base. Visualized upper cervical spine and soft tissues are normal. Sinuses/Orbits:No paranasal sinus fluid levels or advanced mucosal thickening. No mastoid or middle ear effusion. Normal orbits. IMPRESSION: Unchanged pattern of severe white matter disease, compatible with multiple sclerosis. No active demyelination. Electronically Signed   By: Juanetta Nordmann M.D.   On: 10/07/2023 03:33   DG Pelvis 1-2 Views Result Date: 10/06/2023 CLINICAL DATA:  Low back pain EXAM: PELVIS - 1-2 VIEW COMPARISON:  CT 07/26/2023 FINDINGS: SI joints are patent. Pubic symphysis and rami appear intact. No fracture or malalignment. Minimal hip degenerative change IMPRESSION: Minimal hip degenerative change. Electronically Signed   By: Esmeralda Hedge M.D.   On: 10/06/2023 23:04   DG Lumbar Spine Complete Result Date: 10/06/2023 CLINICAL DATA:  Low back pain EXAM: LUMBAR SPINE - COMPLETE 4+ VIEW COMPARISON:  05/10/2013, MRI 03/26/2023 FINDINGS: Lumbar alignment within normal limits. Vertebral body heights are maintained. Multilevel degenerative changes with moderate disc space narrowing at L2-L3 and L3-L4 with moderate severe disc space narrowing at L4-L5. Multilevel degenerative  osteophytes. Prominent lower lumbar facet degenerative changes. Aortic atherosclerosis. IMPRESSION: Multilevel degenerative changes, worst at L4-L5. Electronically Signed   By: Esmeralda Hedge M.D.   On: 10/06/2023 23:03       Feliciana Horn M.D. [facility] 10/08/2023, 2:24 PM  Available via Epic secure chat 7am-7pm After 7 pm, please refer to night coverage provider listed on amion.

## 2023-10-08 NOTE — Progress Notes (Signed)
 Physical Therapy Treatment Patient Details Name: James Gibbs MRN: [id number] DOB: 29-May-1955 Today's Date: 10/08/2023   History of Present Illness Pt is a 68 y.o. male admitted to [facility] on 10/06/23 feeling generally weak throughout his whole body.  He has had significant difficulty getting in and out of bed.  He says when he does get in bed he has a lot of trouble getting out.  He says he does have physical therapy and home aide help at home. Admission concerning for MS flair.  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 No new or contrast-enhancing lesions.  2. Unchanged moderate C5-6 spinal canal stenosis and mild bilateral  neural foraminal stenosis.  3. 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 No abnormal enhancement.  4. Mild spinal canal stenosis at L2-L3 with moderate right and mild  left neural foraminal stenosis.  5. Moderate left L5-S1 neural foraminal stenosis." and not suggestive active demyelination. PMH: MS with RLE weakness, HTN, HLD, DM2, BPH, GERD, gout, mitral valve ant leaflet prolapse.    PT Comments  Pt tolerated treatment well today. Pt was able to progress ambulation in hallway with upright rollator at supervision level. Pt reports that he feels like he is back at his baseline. DC recs updated to resume HHPT. PT will continue to follow.     If plan is discharge home, recommend the following: A lot of help with walking and/or transfers;A little help with bathing/dressing/bathroom;Assistance with cooking/housework;Direct supervision/assist for medications management;Assist for transportation;Help with stairs or ramp for entrance   Can travel by private vehicle        Equipment Recommendations  Wheelchair (measurements PT);Wheelchair cushion (measurements PT)    Recommendations for Other  Services       Precautions / Restrictions Precautions Precautions: Fall Recall of Precautions/Restrictions: Intact Restrictions Weight Bearing Restrictions Per Provider Order: No     Mobility  Bed Mobility Overal bed mobility: Needs Assistance Bed Mobility: Supine to Sit, Sit to Supine     Supine to sit: Supervision Sit to supine: Supervision   General bed mobility comments: Increased time and effort however no physical assist provided.    Transfers Overall transfer level: Needs assistance Equipment used: Rollator (4 wheels) (Upright rollator) Transfers: Sit to/from Stand Sit to Stand: Contact guard assist           General transfer comment: Pt states that he wanted to try on his own today. CGA for safety. Good rollator brake management.    Ambulation/Gait Ambulation/Gait assistance: Supervision Gait Distance (Feet): 200 Feet Assistive device: Rollator (4 wheels) (Upright rollator) Gait Pattern/deviations: Trunk flexed, Decreased dorsiflexion - right, Knee flexed in stance - left, Knee flexed in stance - right, Step-through pattern, Decreased stride length, Narrow base of support Gait velocity: decreased     General Gait Details: Pt noted with R foot drop when ambulating. no LOB noted and no physical assist provided. Chair follow provided however not needed.   Stairs             Wheelchair Mobility     Tilt Bed    Modified Rankin (Stroke Patients Only)       Balance Overall balance assessment: Needs assistance Sitting-balance support: Feet supported Sitting balance-Leahy Scale: Good     Standing balance support: Bilateral upper extremity supported, Reliant on assistive device for balance Standing  balance-Leahy Scale: Poor Standing balance comment: Reliant on external support                            Communication Communication Communication: No apparent difficulties  Cognition Arousal: Alert Behavior During Therapy: WFL for  tasks assessed/performed   PT - Cognitive impairments: No apparent impairments                         Following commands: Intact      Cueing Cueing Techniques: Verbal cues, Visual cues  Exercises      General Comments General comments (skin integrity, edema, etc.): VSS      Pertinent Vitals/Pain Pain Assessment Pain Assessment: No/denies pain    Home Living                          Prior Function            PT Goals (current goals can now be found in the care plan section) Progress towards PT goals: Progressing toward goals    Frequency    Min 3X/week      PT Plan      Co-evaluation              AM-PAC PT "6 Clicks" Mobility   Outcome Measure  Help needed turning from your back to your side while in a flat bed without using bedrails?: A Little Help needed moving from lying on your back to sitting on the side of a flat bed without using bedrails?: A Little Help needed moving to and from a bed to a chair (including a wheelchair)?: A Little Help needed standing up from a chair using your arms (e.g., wheelchair or bedside chair)?: A Lot Help needed to walk in hospital room?: A Lot Help needed climbing 3-5 steps with a railing? : Total 6 Click Score: 14    End of Session Equipment Utilized During Treatment: Gait belt Activity Tolerance: Patient tolerated treatment well Patient left: in bed;with call bell/phone within reach;with bed alarm set Nurse Communication: Mobility status PT Visit Diagnosis: Other abnormalities of gait and mobility (R26.89)     Time: 2952-8413 PT Time Calculation (min) (ACUTE ONLY): 17 min  Charges:    $Gait Training: 8-22 mins PT General Charges $$ ACUTE PT VISIT: 1 Visit                     Rodgers Clack, PT, DPT Acute Rehab Services [phone]    James Gibbs 10/08/2023, 4:02 PM

## 2023-10-08 NOTE — Progress Notes (Signed)
 Inpatient Rehabilitation Admissions Coordinator   Discharge recommendations from PT now [facility]. No need for CIR level rehab.  Jeannetta Millman, RN, MSN Rehab Admissions Coordinator [phone] 10/08/2023 4:53 PM

## 2023-10-08 NOTE — Care Management Obs Status (Signed)
 MEDICARE OBSERVATION STATUS NOTIFICATION   Patient Details  Name: James Gibbs MRN: [id number] Date of Birth: Nov 11, 1955   Medicare Observation Status Notification Given:  Yes  Moon/Obs letter signed and copy given  Wynonia Hedges 10/08/2023, 8:47 AM

## 2023-10-09 DIAGNOSIS — E1169 Type 2 diabetes mellitus with other specified complication: Secondary | ICD-10-CM | POA: Diagnosis not present

## 2023-10-09 DIAGNOSIS — R531 Weakness: Secondary | ICD-10-CM | POA: Diagnosis not present

## 2023-10-09 DIAGNOSIS — E1159 Type 2 diabetes mellitus with other circulatory complications: Secondary | ICD-10-CM | POA: Diagnosis not present

## 2023-10-09 DIAGNOSIS — G35 Multiple sclerosis: Secondary | ICD-10-CM | POA: Diagnosis not present

## 2023-10-09 LAB — GLUCOSE, CAPILLARY: Glucose-Capillary: 107 mg/dL — ABNORMAL HIGH (ref 70–99)

## 2023-10-09 NOTE — Discharge Summary (Signed)
 Physician Discharge Summary   Patient: James Gibbs MRN: [id number] DOB: 09-26-1955  Admit date:     10/06/2023  Discharge date: 10/09/2023  Discharge Physician: James Gibbs   PCP: [facility]   Recommendations at discharge:  Please follow up with PCP in one week.  Please follow up with neurology 2 to 4 weeks.   Discharge Diagnoses: Principal Problem:   Generalized weakness Active Problems:   Hypertension associated with diabetes (HCC)   Multiple sclerosis (HCC)   Type 2 diabetes mellitus (HCC)   Hyperlipidemia associated with type 2 diabetes mellitus (HCC)  Resolved Problems:   * No resolved hospital problems. [facility] Course: James Gibbs is a 68 y.o. male with medical history significant for multiple sclerosis, T2DM, HTN, HLD, BPH who is admitted with generalized weakness.  Assessment and Plan:    Multiple sclerosis Generalized weakness:   - Follow MRI brain and MRI cervical spine, lumbar spine are negative for new lesions.  - Continue home dalfampridine  and Tecfidera  CK wnl. Spoke to neurology , since no new enhancement or lesions, recommend outpatient follow up with Neurology.  Therapy eval recommending CIR VS SNF. Patient reports his weakness has improved.  He is working with PT.    Type 2 diabetes: Continue with SSI.  Resume home meds.     Hypokalemia: Replaced. Repeat levels in am.    Hypertension: Well controlled.  Resume home lisinopril , amlodipine , HCTZ.   Hyperlipidemia: Resume statin.    BPH: Continue Flomax  and Proscar .       Estimated body mass index is 31.67 kg/m as calculated from the following:   Height as of this encounter: 5\' 11"  (1.803 m).   Weight as of this encounter: 103 kg.     Consultants: curbside neurology Procedures performed: none.   Disposition: Home Diet recommendation:  Discharge Diet Orders (From admission, onward)     Start     Ordered   10/09/23 0000  Diet - low sodium heart healthy         10/09/23 0842           Regular diet DISCHARGE MEDICATION: Allergies as of 10/09/2023   No Known Allergies      Medication List     TAKE these medications    allopurinol  300 MG tablet Commonly known as: ZYLOPRIM  Take 300 mg by mouth daily.   amLODipine  10 MG tablet Commonly known as: NORVASC  Take 10 mg by mouth daily.   ascorbic acid  500 MG tablet Commonly known as: VITAMIN C  Take 500 mg by mouth 2 (two) times daily.   atorvastatin  80 MG tablet Commonly known as: LIPITOR  Take 80 mg by mouth at bedtime.   baclofen  10 MG tablet Commonly known as: LIORESAL  Take 10 mg by mouth 2 (two) times daily.   carboxymethylcellulose 0.5 % Soln Commonly known as: REFRESH PLUS Place 3 drops into both eyes daily as needed (dry eyes).   chlorhexidine  0.12 % solution Commonly known as: PERIDEX  Use as directed 15 mLs in the mouth or throat daily.   dalfampridine  10 MG Tb12 Take 1 tablet by mouth every 12 (twelve) hours.   finasteride  5 MG tablet Commonly known as: PROSCAR  Take 5 mg by mouth daily.   hydrochlorothiazide  25 MG tablet Commonly known as: HYDRODIURIL  Take 25 mg by mouth daily.   lisinopril  40 MG tablet Commonly known as: ZESTRIL  Take 40 mg by mouth daily.   metFORMIN  500 MG 24 hr tablet Commonly known as: GLUCOPHAGE -XR Take 500 mg  by mouth daily.   mirtazapine  15 MG tablet Commonly known as: REMERON  Take 7.5 mg by mouth at bedtime.   multivitamin with minerals Tabs tablet Take 1 tablet by mouth in the morning and at bedtime.   OVER THE COUNTER MEDICATION Take 0.5 mLs by mouth daily. OTC prostate supplement "Potent Stream"   OVER THE COUNTER MEDICATION Take 0.5 mLs by mouth daily. OTC supplement "Glucose Support"   oxybutynin  5 MG tablet Commonly known as: DITROPAN  Take 5 mg by mouth daily.   pantoprazole  40 MG tablet Commonly known as: PROTONIX  Take 40 mg by mouth 2 (two) times daily.   polyethylene glycol 17 g packet Commonly known  as: MIRALAX  / GLYCOLAX  Take 17 g by mouth 2 (two) times daily. What changed: when to take this   potassium chloride  SA 20 MEQ tablet Commonly known as: KLOR-CON  M Take 20 mEq by mouth daily.   tamsulosin  0.4 MG Caps capsule Commonly known as: FLOMAX  Take 2 capsules (0.8 mg total) by mouth daily after supper.   Tecfidera  240 MG Cpdr Generic drug: Dimethyl Fumarate  Take 240 mg by mouth 2 (two) times daily.        Follow-up Information     [facility] Follow up.   Specialty: Home Health Services Why: They will call you to resume PT OT Contact information: [address] [place] [address] [phone]                Discharge Exam: James Gibbs Weights   10/06/23 2001  Weight: 103 kg   General exam: Appears calm and comfortable  Respiratory system: Clear to auscultation. Respiratory effort normal. Cardiovascular system: S1 & S2 heard, RRR. No JVD, murmurs Gastrointestinal system: Abdomen is nondistended, soft and nontender. Central nervous system: Alert and oriented.  Extremities: Symmetric 5 x 5 power. Skin: No rashes,  Psychiatry:  Mood & affect appropriate.    Condition at discharge: fair  The results of significant diagnostics from this hospitalization (including imaging, microbiology, ancillary and laboratory) are listed below for reference.   Imaging Studies: MR Cervical Spine W and Wo Contrast Result Date: 10/07/2023 CLINICAL DATA:  Multiple sclerosis EXAM: MRI CERVICAL, THORACIC AND LUMBAR SPINE WITHOUT AND WITH CONTRAST TECHNIQUE: Multiplanar and multiecho pulse sequences of the cervical spine, to include the craniocervical junction and cervicothoracic junction, and thoracic and lumbar spine, were obtained without and with intravenous contrast. CONTRAST:  10mL GADAVIST  GADOBUTROL  1 MMOL/ML IV SOLN COMPARISON:  07/26/2023, 03/26/2023, 01/30/2022 FINDINGS: MRI CERVICAL SPINE FINDINGS Alignment: Physiologic. Vertebrae: No fracture, evidence  of discitis, or bone lesion. Cord: There is generalized spinal cord volume loss. There are patchy foci of hyperintense T2-weighted signal within the spinal cord, unchanged. There are no contrast-enhancing lesions. Posterior Fossa, vertebral arteries, paraspinal tissues: Negative. Disc levels: C1-2: Unremarkable. C2-3: Small disc bulge. There is no spinal canal stenosis. No neural foraminal stenosis. C3-4: Small disc bulge and uncovertebral hypertrophy, unchanged. There is no spinal canal stenosis. No neural foraminal stenosis. C4-5: Disc space narrowing with left-greater-than-right uncovertebral hypertrophy. There is no spinal canal stenosis. Unchanged mild left neural foraminal stenosis. C5-6: Large left subarticular disc osteophyte complex, unchanged. Unchanged moderate spinal canal stenosis. Unchanged mild bilateral neural foraminal stenosis. C6-7: Disc space narrowing with small bulge and endplate spurring. There is no spinal canal stenosis. No neural foraminal stenosis. C7-T1: Small disc osteophyte complex. There is no spinal canal stenosis. No neural foraminal stenosis. MRI THORACIC SPINE FINDINGS Alignment:  Physiologic. Vertebrae: No fracture, evidence of discitis, or  bone lesion. Cord:  Normal signal and morphology. Paraspinal and other soft tissues: Small pleural effusions Disc levels: No spinal canal stenosis MRI LUMBAR SPINE FINDINGS Segmentation:  Standard. Alignment:  Physiologic. Vertebrae:  No fracture, evidence of discitis, or bone lesion. Conus medullaris and cauda equina: Conus extends to the L1 level. There is unchanged appearance of clumping of the distal cauda equina at the lumbosacral junction. No abnormal contrast enhancement. Paraspinal and other soft tissues: Fatty atrophy of the paraspinous muscles. Disc levels: L1-L2: Normal disc space and facet joints. No spinal canal stenosis. No neural foraminal stenosis. L2-L3: Disc space narrowing and small bulge with mild facet hypertrophy. Mild  spinal canal stenosis. Moderate right and mild left neural foraminal stenosis. L3-L4: Small disc bulge with endplate spurring. No spinal canal stenosis. No neural foraminal stenosis. L4-L5: Small disc bulge with endplate spurring. No spinal canal stenosis. Mild bilateral neural foraminal stenosis. L5-S1: Small disc bulge. No spinal canal stenosis. Moderate left neural foraminal stenosis. Visualized sacrum: Normal. IMPRESSION: 1. 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 No new or contrast-enhancing lesions. 2. Unchanged moderate C5-6 spinal canal stenosis and mild bilateral neural foraminal stenosis. 3. Unchanged clumping of the distal cauda equina at the lumbosacral junction. This may be due to mass effect from an arachnoid web or a sequela of arachnoiditis. No abnormal enhancement. 4. Mild spinal canal stenosis at L2-L3 with moderate right and mild left neural foraminal stenosis. 5. Moderate left L5-S1 neural foraminal stenosis. Electronically Signed   By: Juanetta Nordmann M.D.   On: 10/07/2023 03:53   MR THORACIC SPINE W WO CONTRAST Result Date: 10/07/2023 CLINICAL DATA:  Multiple sclerosis EXAM: MRI CERVICAL, THORACIC AND LUMBAR SPINE WITHOUT AND WITH CONTRAST TECHNIQUE: Multiplanar and multiecho pulse sequences of the cervical spine, to include the craniocervical junction and cervicothoracic junction, and thoracic and lumbar spine, were obtained without and with intravenous contrast. CONTRAST:  10mL GADAVIST  GADOBUTROL  1 MMOL/ML IV SOLN COMPARISON:  07/26/2023, 03/26/2023, 01/30/2022 FINDINGS: MRI CERVICAL SPINE FINDINGS Alignment: Physiologic. Vertebrae: No fracture, evidence of discitis, or bone lesion. Cord: There is generalized spinal cord volume loss. There are patchy foci of hyperintense T2-weighted signal within the spinal cord, unchanged. There are no contrast-enhancing lesions. Posterior Fossa, vertebral arteries, paraspinal tissues: Negative. Disc  levels: C1-2: Unremarkable. C2-3: Small disc bulge. There is no spinal canal stenosis. No neural foraminal stenosis. C3-4: Small disc bulge and uncovertebral hypertrophy, unchanged. There is no spinal canal stenosis. No neural foraminal stenosis. C4-5: Disc space narrowing with left-greater-than-right uncovertebral hypertrophy. There is no spinal canal stenosis. Unchanged mild left neural foraminal stenosis. C5-6: Large left subarticular disc osteophyte complex, unchanged. Unchanged moderate spinal canal stenosis. Unchanged mild bilateral neural foraminal stenosis. C6-7: Disc space narrowing with small bulge and endplate spurring. There is no spinal canal stenosis. No neural foraminal stenosis. C7-T1: Small disc osteophyte complex. There is no spinal canal stenosis. No neural foraminal stenosis. MRI THORACIC SPINE FINDINGS Alignment:  Physiologic. Vertebrae: No fracture, evidence of discitis, or bone lesion. Cord:  Normal signal and morphology. Paraspinal and other soft tissues: Small pleural effusions Disc levels: No spinal canal stenosis MRI LUMBAR SPINE FINDINGS Segmentation:  Standard. Alignment:  Physiologic. Vertebrae:  No fracture, evidence of discitis, or bone lesion. Conus medullaris and cauda equina: Conus extends to the L1 level. There is unchanged appearance of clumping of the distal cauda equina at the lumbosacral junction. No abnormal contrast enhancement. Paraspinal and other soft tissues: Fatty atrophy of the paraspinous muscles. Disc  levels: L1-L2: Normal disc space and facet joints. No spinal canal stenosis. No neural foraminal stenosis. L2-L3: Disc space narrowing and small bulge with mild facet hypertrophy. Mild spinal canal stenosis. Moderate right and mild left neural foraminal stenosis. L3-L4: Small disc bulge with endplate spurring. No spinal canal stenosis. No neural foraminal stenosis. L4-L5: Small disc bulge with endplate spurring. No spinal canal stenosis. Mild bilateral neural foraminal  stenosis. L5-S1: Small disc bulge. No spinal canal stenosis. Moderate left neural foraminal stenosis. Visualized sacrum: Normal. IMPRESSION: 1. 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 No new or contrast-enhancing lesions. 2. Unchanged moderate C5-6 spinal canal stenosis and mild bilateral neural foraminal stenosis. 3. Unchanged clumping of the distal cauda equina at the lumbosacral junction. This may be due to mass effect from an arachnoid web or a sequela of arachnoiditis. No abnormal enhancement. 4. Mild spinal canal stenosis at L2-L3 with moderate right and mild left neural foraminal stenosis. 5. Moderate left L5-S1 neural foraminal stenosis. Electronically Signed   By: Juanetta Nordmann M.D.   On: 10/07/2023 03:53   MR Lumbar Spine W Wo Contrast Result Date: 10/07/2023 CLINICAL DATA:  Multiple sclerosis EXAM: MRI CERVICAL, THORACIC AND LUMBAR SPINE WITHOUT AND WITH CONTRAST TECHNIQUE: Multiplanar and multiecho pulse sequences of the cervical spine, to include the craniocervical junction and cervicothoracic junction, and thoracic and lumbar spine, were obtained without and with intravenous contrast. CONTRAST:  10mL GADAVIST  GADOBUTROL  1 MMOL/ML IV SOLN COMPARISON:  07/26/2023, 03/26/2023, 01/30/2022 FINDINGS: MRI CERVICAL SPINE FINDINGS Alignment: Physiologic. Vertebrae: No fracture, evidence of discitis, or bone lesion. Cord: There is generalized spinal cord volume loss. There are patchy foci of hyperintense T2-weighted signal within the spinal cord, unchanged. There are no contrast-enhancing lesions. Posterior Fossa, vertebral arteries, paraspinal tissues: Negative. Disc levels: C1-2: Unremarkable. C2-3: Small disc bulge. There is no spinal canal stenosis. No neural foraminal stenosis. C3-4: Small disc bulge and uncovertebral hypertrophy, unchanged. There is no spinal canal stenosis. No neural foraminal stenosis. C4-5: Disc space narrowing with  left-greater-than-right uncovertebral hypertrophy. There is no spinal canal stenosis. Unchanged mild left neural foraminal stenosis. C5-6: Large left subarticular disc osteophyte complex, unchanged. Unchanged moderate spinal canal stenosis. Unchanged mild bilateral neural foraminal stenosis. C6-7: Disc space narrowing with small bulge and endplate spurring. There is no spinal canal stenosis. No neural foraminal stenosis. C7-T1: Small disc osteophyte complex. There is no spinal canal stenosis. No neural foraminal stenosis. MRI THORACIC SPINE FINDINGS Alignment:  Physiologic. Vertebrae: No fracture, evidence of discitis, or bone lesion. Cord:  Normal signal and morphology. Paraspinal and other soft tissues: Small pleural effusions Disc levels: No spinal canal stenosis MRI LUMBAR SPINE FINDINGS Segmentation:  Standard. Alignment:  Physiologic. Vertebrae:  No fracture, evidence of discitis, or bone lesion. Conus medullaris and cauda equina: Conus extends to the L1 level. There is unchanged appearance of clumping of the distal cauda equina at the lumbosacral junction. No abnormal contrast enhancement. Paraspinal and other soft tissues: Fatty atrophy of the paraspinous muscles. Disc levels: L1-L2: Normal disc space and facet joints. No spinal canal stenosis. No neural foraminal stenosis. L2-L3: Disc space narrowing and small bulge with mild facet hypertrophy. Mild spinal canal stenosis. Moderate right and mild left neural foraminal stenosis. L3-L4: Small disc bulge with endplate spurring. No spinal canal stenosis. No neural foraminal stenosis. L4-L5: Small disc bulge with endplate spurring. No spinal canal stenosis. Mild bilateral neural foraminal stenosis. L5-S1: Small disc bulge. No spinal canal stenosis. Moderate left neural foraminal stenosis. Visualized  sacrum: Normal. IMPRESSION: 1. 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 No new or contrast-enhancing lesions. 2.  Unchanged moderate C5-6 spinal canal stenosis and mild bilateral neural foraminal stenosis. 3. Unchanged clumping of the distal cauda equina at the lumbosacral junction. This may be due to mass effect from an arachnoid web or a sequela of arachnoiditis. No abnormal enhancement. 4. Mild spinal canal stenosis at L2-L3 with moderate right and mild left neural foraminal stenosis. 5. Moderate left L5-S1 neural foraminal stenosis. Electronically Signed   By: Juanetta Nordmann M.D.   On: 10/07/2023 03:53   MR Brain W and Wo Contrast Result Date: 10/07/2023 CLINICAL DATA:  Multiple sclerosis EXAM: MRI HEAD WITHOUT AND WITH CONTRAST TECHNIQUE: Multiplanar, multiecho pulse sequences of the brain and surrounding structures were obtained without and with intravenous contrast. CONTRAST:  10mL GADAVIST  GADOBUTROL  1 MMOL/ML IV SOLN COMPARISON:  07/26/2023 FINDINGS: Brain: No acute infarct, mass effect or extra-axial collection. No acute or chronic hemorrhage. There is generalized volume loss. There is confluent hyperintense T2-weighted signal within the bihemispheric white matter. The distribution of the white matter lesions is unchanged. The midline structures are normal. There is no abnormal contrast enhancement. Vascular: Normal flow voids. Skull and upper cervical spine: Normal calvarium and skull base. Visualized upper cervical spine and soft tissues are normal. Sinuses/Orbits:No paranasal sinus fluid levels or advanced mucosal thickening. No mastoid or middle ear effusion. Normal orbits. IMPRESSION: Unchanged pattern of severe white matter disease, compatible with multiple sclerosis. No active demyelination. Electronically Signed   By: Juanetta Nordmann M.D.   On: 10/07/2023 03:33   DG Pelvis 1-2 Views Result Date: 10/06/2023 CLINICAL DATA:  Low back pain EXAM: PELVIS - 1-2 VIEW COMPARISON:  CT 07/26/2023 FINDINGS: SI joints are patent. Pubic symphysis and rami appear intact. No fracture or malalignment. Minimal hip  degenerative change IMPRESSION: Minimal hip degenerative change. Electronically Signed   By: Esmeralda Hedge M.D.   On: 10/06/2023 23:04   DG Lumbar Spine Complete Result Date: 10/06/2023 CLINICAL DATA:  Low back pain EXAM: LUMBAR SPINE - COMPLETE 4+ VIEW COMPARISON:  05/10/2013, MRI 03/26/2023 FINDINGS: Lumbar alignment within normal limits. Vertebral body heights are maintained. Multilevel degenerative changes with moderate disc space narrowing at L2-L3 and L3-L4 with moderate severe disc space narrowing at L4-L5. Multilevel degenerative osteophytes. Prominent lower lumbar facet degenerative changes. Aortic atherosclerosis. IMPRESSION: Multilevel degenerative changes, worst at L4-L5. Electronically Signed   By: Esmeralda Hedge M.D.   On: 10/06/2023 23:03    Microbiology: Results for orders placed or performed during the hospital encounter of 07/26/23  Resp panel by RT-PCR (RSV, Flu A&B, Covid) Anterior Nasal Swab     Status: None   Collection Time: 07/26/23 10:31 AM   Specimen: Anterior Nasal Swab  Result Value Ref Range Status   SARS Coronavirus 2 by RT PCR NEGATIVE NEGATIVE Final   Influenza A by PCR NEGATIVE NEGATIVE Final   Influenza B by PCR NEGATIVE NEGATIVE Final    Comment: (NOTE) The Xpert Xpress SARS-CoV-2/FLU/RSV plus assay is intended as an aid in the diagnosis of influenza from Nasopharyngeal swab specimens and should not be used as a sole basis for treatment. Nasal washings and aspirates are unacceptable for Xpert Xpress SARS-CoV-2/FLU/RSV testing.  Fact Sheet for Patients: BloggerCourse.com  Fact Sheet for Healthcare Providers: SeriousBroker.it  This test is not yet approved or cleared by the [place]  FDA and has been authorized for detection and/or diagnosis of SARS-CoV-2 by FDA under an Emergency  Use Authorization (EUA). This EUA will remain in effect (meaning this test can be used) for the duration of the COVID-19  declaration under Section 564(b)(1) of the Act, 21 U.S.C. section 360bbb-3(b)(1), unless the authorization is terminated or revoked.     Resp Syncytial Virus by PCR NEGATIVE NEGATIVE Final    Comment: (NOTE) Fact Sheet for Patients: BloggerCourse.com  Fact Sheet for Healthcare Providers: SeriousBroker.it  This test is not yet approved or cleared by the [place]  FDA and has been authorized for detection and/or diagnosis of SARS-CoV-2 by FDA under an Emergency Use Authorization (EUA). This EUA will remain in effect (meaning this test can be used) for the duration of the COVID-19 declaration under Section 564(b)(1) of the Act, 21 U.S.C. section 360bbb-3(b)(1), unless the authorization is terminated or revoked.  Performed at [facility], [address] [place], [place] [address]   Respiratory (~20 pathogens) panel by PCR     Status: None   Collection Time: 07/26/23 10:31 AM   Specimen: Nasopharyngeal Swab; Respiratory  Result Value Ref Range Status   Adenovirus NOT DETECTED NOT DETECTED Final   Coronavirus 229E NOT DETECTED NOT DETECTED Final    Comment: (NOTE) The Coronavirus on the Respiratory Panel, DOES NOT test for the novel  Coronavirus (2019 nCoV)    Coronavirus HKU1 NOT DETECTED NOT DETECTED Final   Coronavirus NL63 NOT DETECTED NOT DETECTED Final   Coronavirus OC43 NOT DETECTED NOT DETECTED Final   Metapneumovirus NOT DETECTED NOT DETECTED Final   Rhinovirus / Enterovirus NOT DETECTED NOT DETECTED Final   Influenza A NOT DETECTED NOT DETECTED Final   Influenza B NOT DETECTED NOT DETECTED Final   Parainfluenza Virus 1 NOT DETECTED NOT DETECTED Final   Parainfluenza Virus 2 NOT DETECTED NOT DETECTED Final   Parainfluenza Virus 3 NOT DETECTED NOT DETECTED Final   Parainfluenza Virus 4 NOT DETECTED NOT DETECTED Final   Respiratory Syncytial Virus NOT DETECTED NOT DETECTED Final   Bordetella pertussis NOT  DETECTED NOT DETECTED Final   Bordetella Parapertussis NOT DETECTED NOT DETECTED Final   Chlamydophila pneumoniae NOT DETECTED NOT DETECTED Final   Mycoplasma pneumoniae NOT DETECTED NOT DETECTED Final    Comment: Performed at [facility], [address] [place], [place] [address]  Blood culture (routine x 2)     Status: None   Collection Time: 07/26/23  1:21 PM   Specimen: BLOOD LEFT ARM  Result Value Ref Range Status   Specimen Description BLOOD LEFT ARM  Final   Special Requests   Final    BOTTLES DRAWN AEROBIC AND ANAEROBIC Blood Culture adequate volume   Culture   Final    NO GROWTH 5 DAYS Performed at [facility], [address], [place], [place] [address]    Report Status 07/31/2023 FINAL  Final  Blood culture (routine x 2)     Status: None   Collection Time: 07/27/23  4:40 AM   Specimen: BLOOD RIGHT HAND  Result Value Ref Range Status   Specimen Description BLOOD RIGHT HAND  Final   Special Requests   Final    BOTTLES DRAWN AEROBIC AND ANAEROBIC Blood Culture adequate volume   Culture   Final    NO GROWTH 5 DAYS Performed at [facility], [address] [place], [place] [address]    Report Status 08/01/2023 FINAL  Final    Labs: CBC: Recent Labs  Lab 10/06/23 2004 10/07/23 0538  WBC 6.8 6.6  HGB 14.1  14.3  HCT 40.6 41.0  MCV 94.9 94.3  PLT 170 160   Basic Metabolic Panel: Recent Labs  Lab 10/06/23 2004 10/07/23 0538  NA 140 139  K 3.3* 3.7  CL 102 103  CO2 28 27  GLUCOSE 108* 92  BUN 11 12  CREATININE 1.08 0.99  CALCIUM  9.3 8.6*   Liver Function Tests: Recent Labs  Lab 10/06/23 2004  AST 32  ALT 58*  ALKPHOS 84  BILITOT 1.1  PROT 6.6  ALBUMIN 3.7   CBG: Recent Labs  Lab 10/08/23 0622 10/08/23 1115 10/08/23 1504 10/08/23 2114 10/09/23 0615  GLUCAP 109* 179* 136* 146* 107*    Discharge time spent: 39 minutes.   Signed: Feliciana Horn, MD [facility] 10/09/2023

## 2023-10-09 NOTE — TOC Transition Note (Signed)
 Transition of Care [facility]) - Discharge Note   Patient Details  Name: James Gibbs MRN: [id number] Date of Birth: February 04, 1956  Transition of Care [facility]) CM/SW Contact:  Tom-Johnson, Angelique Ken, RN Phone Number: 10/09/2023, 10:26 AM   Clinical Narrative:     Patient is scheduled for discharge today.  Home health info, hospital f/u and discharge instructions on AVS. Wheelchair ordered through the [place], order faxed to [phone]) and emailed to [facility] .gov).   Neighbor, Bartholomew Light to transport at discharge.  No further TOC needs noted.        Final next level of care: Home w Home Health Services Barriers to Discharge: Barriers Resolved   Patient Goals and CMS Choice Patient states their goals for this hospitalization and ongoing recovery are:: To return home CMS Medicare.gov Compare Post Acute Care list provided to:: Patient Choice offered to / list presented to : Patient      Discharge Placement                Patient to be transferred to facility by: Neighbor/Friend Name of family member notified: [facility] and Services Additional resources added to the After Visit Summary for                  DME Arranged: Wheelchair manual DME Agency: [facility], [place] Date DME Agency Contacted: 10/09/23 Time DME Agency Contacted: 1020 Representative spoke with at DME Agency: Via Fax and email, no one ansewred the phone. HH Arranged: PT, OT HH Agency: [facility] Date HH Agency Contacted: 10/09/23 Time HH Agency Contacted: 1000 Representative spoke with at [facility] Agency: Loetta Ringer  Social Drivers of Health (SDOH) Interventions SDOH Screenings   Food Insecurity: No Food Insecurity (10/07/2023)  Housing: Low Risk  (10/07/2023)  Transportation Needs: No Transportation Needs (10/07/2023)  Utilities: Not At Risk (10/07/2023)  Financial Resource Strain: Not on File (09/18/2021)   Received from [place], [place]  Physical Activity: Not  on File (09/18/2021)   Received from [place], [place]  Social Connections: Socially Isolated (10/07/2023)  Stress: Not on File (09/18/2021)   Received from [place], [facility]  Tobacco Use: Low Risk  (10/06/2023)     Readmission Risk Interventions    09/06/2022   10:20 AM  Readmission Risk Prevention Plan  Transportation Screening Complete  PCP or Specialist Appt within 5-7 Days Complete  Home Care Screening Complete  Medication Review (RN CM) Complete

## 2023-10-09 NOTE — Plan of Care (Signed)
   Problem: Education: Goal: Ability to describe self-care measures that may prevent or decrease complications (Diabetes Survival Skills Education) will improve Outcome: Progressing   Problem: Coping: Goal: Ability to adjust to condition or change in health will improve Outcome: Progressing   Problem: Fluid Volume: Goal: Ability to maintain a balanced intake and output will improve Outcome: Progressing

## 2023-10-09 NOTE — Plan of Care (Signed)
 Discharge instructions discussed with patient.  Patient instructed on home medications, restrictions, and follow up appointments. Belongings gathered and sent with patient.  Patients home medications picked up in the pharmacy. No new prescriptions.

## 2023-11-15 NOTE — Progress Notes (Deleted)
  Electrophysiology Office Note:    Date:  11/15/2023   ID:  DYON ROTERT, DOB 04-Aug-1955, MRN [id number]  PCP:  Clinic, [facility] Providers Cardiologist:  None { Click to update primary MD,subspecialty MD or APP then REFRESH:1}    Referring MD: Leontine Cramp, NP   History of Present Illness:    James Gibbs is a 68 y.o. male with a medical history significant for multiple sclerosis, type 2 diabetes, hyperlipidemia, hypertension, referred for evaluation and management of SVT.     He is referred by Cramp Leontine of [facility] health.  Discussed the use of AI scribe software for clinical note transcription with the patient, who gave verbal consent to proceed.  History of Present Illness          Today, ***  EKGs/Labs/Other Studies Reviewed Today:     Echocardiogram:  TTE 08/20/2022 - Stress TTE Normal LV size, EF 65%. No WMA   Monitors:  *** day monitor ***  -- my interpretation ***  Stress testing:  *** ***  Advanced imaging:  *** ***  Cardiac catherization  *** ***  EKG:         Physical Exam:    VS:  There were no vitals taken for this visit.    Wt Readings from Last 3 Encounters:  10/06/23 227 lb 1.2 oz (103 kg)  03/26/23 227 lb 4.7 oz (103.1 kg)  09/16/22 227 lb 4.7 oz (103.1 kg)     GEN: *** Well nourished, well developed in no acute distress CARDIAC: ***RRR, no murmurs, rubs, gallops RESPIRATORY:  Normal work of breathing MUSCULOSKELETAL: *** edema    ASSESSMENT & PLAN:     SVT Noted on recent cardiac monitor We do not have these results  Hypertension ***  Obesity ***  *** ***  *** ***    Signed, Eulas FORBES Furbish, MD  11/15/2023 5:44 PM    [facility]

## 2023-11-16 ENCOUNTER — Ambulatory Visit: Attending: Cardiovascular Disease | Admitting: Cardiovascular Disease

## 2023-11-18 ENCOUNTER — Encounter: Payer: Self-pay | Admitting: Cardiovascular Disease

## 2023-12-30 ENCOUNTER — Ambulatory Visit: Admitting: Cardiovascular Disease

## 2024-01-19 NOTE — Progress Notes (Signed)
 Electrophysiology Office Note:    Date:  01/20/2024   ID:  James Gibbs, DOB Dec 15, 1955, MRN [id number]  PCP:  Clinic, [facility] Providers Cardiologist:  None     Referring MD: Leontine Cramp, NP   History of Present Illness:    James Gibbs is a 68 y.o. male with a medical history significant for hypertensive heart disease, aortic atherosclerosis, obesity, type 2 diabetes, obstructive sleep apnea on CPAP, referred for arrhythmia management.      Discussed the use of AI scribe software for clinical note transcription with the patient, who gave verbal consent to proceed.  History of Present Illness James Gibbs is a 68 year old male with hypertension and diabetes who presents with concerns about heart rhythm abnormalities and leg swelling. He was referred by a healthcare provider at [facility] for evaluation of abnormal heart monitor results.  The heart monitor worn from April to May 2025 recorded ten brief runs of supraventricular tachycardia, with the longest episode lasting two minutes and thirteen seconds and an average heart rate of 150 beats per minute. Persistent swelling in his feet has been present since mid-June 2025, requiring compression devices and affecting his ability to wear regular shoes.  His medical history includes hypertension, type 2 diabetes, sleep apnea, and multiple sclerosis. He is not using his CPAP machine regularly, which affects his sleep quality. He has been on hydrochlorothiazide  since 1983 for blood pressure management. He experiences no shortness of breath when lying flat and uses a wheelchair due to multiple sclerosis. He reports a history of a racing heart but currently feels good.         Today, he reports feeling well.  He has no palpitations, presyncope, syncope.  His dependent edema is stable.  EKGs/Labs/Other Studies Reviewed Today:     Echocardiogram:  TTE - ordered Results  pending   Monitors:  14 day monitor April 2025-- my interpretation Sinus rhythm predominates, heart rate 56 to 132 bpm, average 79 bpm Rare PACs, less than 1%, occasional PVCs, 1.3% There were no symptoms reported The longest episode of nonsustained VT lasted 10.8 seconds. There was an SVT episode that lasted 2 minutes and 13 seconds with an average heart rate of 150 bpm.  There was variation cycle length.    EKG:   EKG Interpretation Date/Time:  Wednesday January 20 2024 14:38:57 EDT Ventricular Rate:  85 PR Interval:  270 QRS Duration:  72 QT Interval:  364 QTC Calculation: 433 R Axis:   15  Text Interpretation: Sinus rhythm with 1st degree A-V block Nonspecific T wave abnormality When compared with ECG of 06-Oct-2023 20:07, No significant change was found Confirmed by Nancey Scotts [phone]) on 01/20/2024 2:51:53 PM     Physical Exam:    VS:  BP (!) 153/95 (BP Location: Left Arm, Patient Position: Sitting, Cuff Size: Large)   Pulse 87   Ht 5' 11 (1.803 m)   Wt 216 lb (98 kg)   SpO2 100%   BMI 30.13 kg/m     Wt Readings from Last 3 Encounters:  01/20/24 216 lb (98 kg)  10/06/23 227 lb 1.2 oz (103 kg)  03/26/23 227 lb 4.7 oz (103.1 kg)     GEN: Well nourished, well developed in no acute distress CARDIAC: RRR, no murmurs, rubs, gallops RESPIRATORY:  Normal work of breathing MUSCULOSKELETAL: no edema    ASSESSMENT & PLAN:     SVT  Appear to be atrial runs He  has been asymptomatic If for most likely atrial tachycardia Will look for an underlying predisposing cause  Obstructive sleep apnea On CPAP  Hypertension On amlodipine  10, hydrochlorothiazide  25, lisinopril  40  Dependent edema No significant Rales Will start furosemide  20 mg daily BMP next week Order TTE May need to be graduated from EP to general cardiology    Signed, Eulas FORBES Furbish, MD  01/20/2024 3:00 PM    [facility]

## 2024-01-20 ENCOUNTER — Ambulatory Visit: Attending: Cardiology | Admitting: Cardiovascular Disease

## 2024-01-20 ENCOUNTER — Encounter: Payer: Self-pay | Admitting: Cardiovascular Disease

## 2024-01-20 VITALS — BP 153/95 | HR 87 | Ht 71.0 in | Wt 216.0 lb

## 2024-01-20 DIAGNOSIS — I471 Supraventricular tachycardia, unspecified: Secondary | ICD-10-CM

## 2024-01-20 MED ORDER — FUROSEMIDE 20 MG PO TABS: 20.0000 mg | ORAL_TABLET | Freq: Every day | ORAL | 1 refills | Status: AC

## 2024-01-20 NOTE — Patient Instructions (Signed)
 Medication Instructions:  Your physician has recommended you make the following change in your medication:   ** Begin Furosemide  20mg  - 1 tablet by mouth daily  *If you need a refill on your cardiac medications before your next appointment, please call your pharmacy*  Lab Work:  ** BMET x 1 week If you have labs (blood work) drawn today and your tests are completely normal, you will receive your results only by: MyChart Message (if you have MyChart) OR A paper copy in the mail If you have any lab test that is abnormal or we need to change your treatment, we will call you to review the results.  Testing/Procedures: Your physician has requested that you have an echocardiogram. Echocardiography is a painless test that uses sound waves to create images of your heart. It provides your doctor with information about the size and shape of your heart and how well your heart's chambers and valves are working. This procedure takes approximately one hour. There are no restrictions for this procedure. Please do NOT wear cologne, perfume, aftershave, or lotions (deodorant is allowed). Please arrive 15 minutes prior to your appointment time.  Please note: We ask at that you not bring children with you during ultrasound (echo/ vascular) testing. Due to room size and safety concerns, children are not allowed in the ultrasound rooms during exams. Our front office staff cannot provide observation of children in our lobby area while testing is being conducted. An adult accompanying a patient to their appointment will only be allowed in the ultrasound room at the discretion of the ultrasound technician under special circumstances. We apologize for any inconvenience.   Follow-Up: At [facility], you and your health needs are our priority.  As part of our continuing mission to provide you with exceptional heart care, our providers are all part of one team.  This team includes your primary Cardiologist  (physician) and Advanced Practice Providers or APPs (Physician Assistants and Nurse Practitioners) who all work together to provide you with the care you need, when you need it.  Your next appointment:   1 month(s)  Provider:   You will see one of the following Advanced Practice Providers on your designated Care Team:   Charlies Arthur, PA-C Michael Andy Tillery, PA-C Suzann Riddle, NP Daphne Barrack, NP    We recommend signing up for the patient portal called MyChart.  Sign up information is provided on this After Visit Summary.  MyChart is used to connect with patients for Virtual Visits (Telemedicine).  Patients are able to view lab/test results, encounter notes, upcoming appointments, etc.  Non-urgent messages can be sent to your provider as well.   To learn more about what you can do with MyChart, go to [email or web address].

## 2024-01-30 ENCOUNTER — Emergency Department (HOSPITAL_COMMUNITY)

## 2024-01-30 ENCOUNTER — Encounter (HOSPITAL_COMMUNITY): Payer: Self-pay | Admitting: Emergency Medicine

## 2024-01-30 ENCOUNTER — Emergency Department (HOSPITAL_COMMUNITY)
Admission: EM | Admit: 2024-01-30 | Discharge: 2024-01-30 | Disposition: A | Discharge status: Home / Self Care | Attending: Emergency Medicine | Admitting: Emergency Medicine

## 2024-01-30 DIAGNOSIS — R531 Weakness: Secondary | ICD-10-CM | POA: Insufficient documentation

## 2024-01-30 DIAGNOSIS — I10 Essential (primary) hypertension: Secondary | ICD-10-CM | POA: Diagnosis not present

## 2024-01-30 DIAGNOSIS — E119 Type 2 diabetes mellitus without complications: Secondary | ICD-10-CM | POA: Insufficient documentation

## 2024-01-30 DIAGNOSIS — N50812 Left testicular pain: Secondary | ICD-10-CM | POA: Insufficient documentation

## 2024-01-30 LAB — BASIC METABOLIC PANEL WITH GFR
Anion gap: 14 (ref 5–15)
BUN: 16 mg/dL (ref 8–23)
CO2: 24 mmol/L (ref 22–32)
Calcium: 8.9 mg/dL (ref 8.9–10.3)
Chloride: 103 mmol/L (ref 98–111)
Creatinine, Ser: 1.23 mg/dL (ref 0.61–1.24)
GFR, Estimated: >60 mL/min (ref >60)
Glucose, Bld: 137 mg/dL — ABNORMAL HIGH (ref 70–99)
Potassium: 4 mmol/L (ref 3.5–5.1)
Sodium: 141 mmol/L (ref 135–145)

## 2024-01-30 LAB — CBC WITH DIFFERENTIAL/PLATELET
Abs Immature Granulocytes: 0.02 K/uL (ref 0.00–0.07)
Basophils Absolute: 0 K/uL (ref 0.0–0.1)
Basophils Relative: 1 %
Eosinophils Absolute: 0.1 K/uL (ref 0.0–0.5)
Eosinophils Relative: 2 %
HCT: 44.1 % (ref 39.0–52.0)
Hemoglobin: 15 g/dL (ref 13.0–17.0)
Immature Granulocytes: 0 %
Lymphocytes Relative: 17 %
Lymphs Abs: 1.4 K/uL (ref 0.7–4.0)
MCH: 33 pg (ref 26.0–34.0)
MCHC: 34 g/dL (ref 30.0–36.0)
MCV: 96.9 fL (ref 80.0–100.0)
Monocytes Absolute: 0.5 K/uL (ref 0.1–1.0)
Monocytes Relative: 7 %
Neutro Abs: 5.9 K/uL (ref 1.7–7.7)
Neutrophils Relative %: 73 %
Platelets: 181 K/uL (ref 150–400)
RBC: 4.55 MIL/uL (ref 4.22–5.81)
RDW: 13.6 % (ref 11.5–15.5)
WBC: 8 K/uL (ref 4.0–10.5)
nRBC: 0 % (ref 0.0–0.2)

## 2024-01-30 LAB — URINALYSIS, W/ REFLEX TO CULTURE (INFECTION SUSPECTED)
Bacteria, UA: NONE SEEN
Bilirubin Urine: NEGATIVE
Glucose, UA: NEGATIVE mg/dL
Hgb urine dipstick: NEGATIVE
Ketones, ur: NEGATIVE mg/dL
Leukocytes,Ua: NEGATIVE
Nitrite: NEGATIVE
Protein, ur: NEGATIVE mg/dL
Specific Gravity, Urine: 1.016 (ref 1.005–1.030)
pH: 7 (ref 5.0–8.0)

## 2024-01-30 MED ORDER — IOHEXOL 350 MG/ML SOLN
75.0000 mL | Freq: Once | INTRAVENOUS | Status: AC | PRN
  Administered 2024-01-30: 75 mL via INTRAVENOUS

## 2024-01-30 MED ORDER — LACTATED RINGERS IV BOLUS
1000.0000 mL | Freq: Once | INTRAVENOUS | Status: AC
  Administered 2024-01-30: 1000 mL via INTRAVENOUS

## 2024-01-30 NOTE — ED Notes (Signed)
 Pt completely assessed by EDP and set to discharge. No RN assessment performed.  Pt provided discharge instructions and prescription information. Pt was given the opportunity to ask questions and questions were answered.

## 2024-01-30 NOTE — ED Provider Notes (Signed)
 [place] EMERGENCY DEPARTMENT AT [facility] Provider Note HPI James Gibbs is a 68 y.o. male with a medical history of multiple sclerosis, T2DM, HTN, HLD, BPH who presents the emergency department today for left testicle pain.  Patient has been feeling generalized weak over the last week but is still able to transition himself from his bed to his walker and get around his house using his uplift walker.  He lives alone and is able to take care of himself.  He has physical therapy that visits him twice a week.  Patient states that he started experiencing left testicle pain about a week ago.  He thinks it may be related to transferring himself onto his walker but is unsure.  He denies fevers, chills, chest pain, shortness of breath, abdominal pain, nausea, vomiting, dysuria.  He is not sexually active.  When asked about his weakness, the patient states that he has had progressive bilateral lower extremity weakness over the last few weeks.  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    Malignant hypertension    Mitral valve anterior leaflet prolapse    Mixed hyperlipidemia    Multiple sclerosis (HCC)    Past Surgical History:  Procedure Laterality Date   COLONOSCOPY WITH PROPOFOL  N/A 12/17/2018   Procedure: COLONOSCOPY WITH PROPOFOL ;  Surgeon: Donnald Charleston, MD;  Location: [facility];  Service: Endoscopy;  Laterality: N/A;   ESOPHAGOGASTRODUODENOSCOPY (EGD) WITH PROPOFOL  N/A 12/14/2018   Procedure: ESOPHAGOGASTRODUODENOSCOPY (EGD) MODERATE SEDATION;  Surgeon: Donnald Charleston, MD;  Location: [facility];  Service: Endoscopy;  Laterality: N/A;   GIVENS CAPSULE STUDY N/A 12/18/2018   Procedure: GIVENS CAPSULE STUDY;  Surgeon: Burnette Elsie, MD;  Location: [facility];  Service: Endoscopy;  Laterality: N/A;   none      Review of Systems Pertinent positives and negative findings are listed as part of the  History of Present Illness and MDM  Physical Exam Vitals:   01/30/24 1824 01/30/24 2230  BP: (!) 160/88 (!) 154/96  Pulse: 82 71  Resp: 17 16  Temp: 98.8 F (37.1 C) 98.8 F (37.1 C)  TempSrc:  Oral  SpO2: 100% 100%     Constitutional Nursing notes reviewed Vital signs reviewed  HEENT No obvious trauma Pupils round, equal, and reactive to light. Pupils cross midline Neck supple  Respiratory Effort normal Breathing well on room air CTAB  CV Normal rate and rhythm   Abdomen Soft, non-tender, non-distended No peritonitis  GU Normal male genitalia Testicles descended in scrotum bilaterally No evidence of high riding testicle Left testicular tenderness to palpation without edema No evidence of perineal erythema, skin breakdown, induration or fluctuance  MSK Atraumatic No obvious deformity ROM appropriate  Neuro Cranial nerves II through XII intact 5/5 strength in bilateral upper extremities 4/5 strength in bilateral lower extremities    MDM:  Initial Differential Diagnoses includes soft tissue infection, Fournier's gangrene, testicular torsion, testicular trauma, epididymitis, chronic deconditioning, CVA, electrolyte abnormality  I reviewed the patient's vitals, the nursing triage note and evaluated the patient at bedside.  Patient presents with 1 week of left testicle pain and bilateral lower extremity weakness that has been progressively worsening.  Denies infectious symptoms as detailed above.  The patient lives at home alone and is able to take care of himself.  He can still get around his house using his top lift walker.  Physical therapy visits him twice a week.  Labs ordered,  reviewed and interpreted by myself which show no evidence of leukocytosis, anemia, AKI or urinary tract infection.  Patient is not sexually active.  I very low suspicion for epididymitis.  Testicular ultrasound shows no evidence of torsion or epididymitis.  The patient does have bilateral  epididymal cysts that require outpatient follow-up.  Patient was informed of these findings.  I considered soft tissue infection.  No evidence of soft tissue infection on my exam as detailed above.  CT pelvis reviewed by myself shows no evidence of cellulitis, abscess formation or necrotizing infection.  No other acute findings within the pelvis to explain his symptoms.  In addition, the patient has no fever or systemic signs of infection.  In terms of his deconditioning, I had a long conversation with the patient about his ability to take care of himself at home.  He states that his leg weakness was not an acute change and he has been slowly getting weaker.  He still able to get around his house using his uplift walker.  Patient has no focal findings concerning for CVA. Physical therapy visits him at home.  Unfortunately, I do not think I will be able to admit him for chronic deconditioning if he has a safe place to go.  I strongly advised PCP follow-up and consideration of rehabilitation placement.  Procedures: Procedures  Medications administered in the ED: Medications  lactated ringers  bolus 1,000 mL (1,000 mLs Intravenous New Bag/Given 01/30/24 1938)  iohexol  (OMNIPAQUE ) 350 MG/ML injection 75 mL (75 mLs Intravenous Contrast Given 01/30/24 2051)     Impression: 1. Pain in left testicle   2. Weakness      Patient's presentation is most consistent with acute presentation with potential threat to life or bodily function.  Disposition: ED Disposition:  Discharge   Discharge: Patient is felt to be medically appropriate for discharge at this time. Patient was instructed to follow up with their primary care doctor/specialists listed above for re-evaluation. Patient was given strict return precautions.  ED Discharge Orders     None             Dionisio Blunt, MD 01/30/24 2241    Freddi Hamilton, MD 01/31/24 2241

## 2024-01-30 NOTE — Discharge Instructions (Addendum)
 You were seen in the emergency department today for weakness and left-sided testicle pain.  While you are here we did a physical exam, labs and imaging which were all reassuring.  You do have some cysts in your epididymis which need evaluation on an outpatient basis.  Please follow-up with your primary care doctor for further evaluation of these cysts.  Come back to the emergency department if you develop fevers, severe abdominal pain, nausea, vomiting or if you have any other reason to believe you need emergency care

## 2024-01-30 NOTE — ED Triage Notes (Signed)
 Pt here from home with c/o butt check pain , pt took some tylenol  with minimal relief

## 2024-02-01 NOTE — Progress Notes (Unsigned)
  Electrophysiology Office Note:   Date:  02/01/2024  ID:  James Gibbs, DOB 05/08/1956, MRN [id number]  Primary Cardiologist: None Primary Heart Failure: None Electrophysiologist: None  {Click to update primary MD,subspecialty MD or APP then REFRESH:1}    History of Present Illness:   James Gibbs is a 68 y.o. male with h/o SVT / AT, HTN, aortic atherosclerosis, OSA on CPAP, obesity, DM II seen today for routine electrophysiology followup.   Since last being seen in our clinic the patient reports doing ***.    He denies chest pain, palpitations, dyspnea, PND, orthopnea, nausea, vomiting, dizziness, syncope, edema, weight gain, or early satiety.   Review of systems complete and found to be negative unless listed in HPI.   EP Information / Studies Reviewed:    EKG is not ordered today. EKG from 01/20/24 reviewed which showed SR with 1st degree AVB 85 bpm     Arrhythmia / AAD / Pertinent EP Studies Cardiac Monitor 08/2023 > SR predominates HR 56-132 bpm, ave 79 bpm. Rare PAC's less than 1%, occ PVC 1.3%, no sx reported, NSVT with longest episode of 10.8 seconds, SVT episode 2 min 13 sec with ave HR 150 bpm with variation in cycle length    Risk Assessment/Calculations:     No BP recorded.  {Refresh Note OR Click here to enter BP  :1}***        Physical Exam:   VS:  There were no vitals taken for this visit.   Wt Readings from Last 3 Encounters:  01/20/24 216 lb (98 kg)  10/06/23 227 lb 1.2 oz (103 kg)  03/26/23 227 lb 4.7 oz (103.1 kg)     GEN: Well nourished, well developed in no acute distress NECK: No JVD; No carotid bruits CARDIAC: {EPRHYTHM:28826}, no murmurs, rubs, gallops RESPIRATORY:  Clear to auscultation without rales, wheezing or rhonchi  ABDOMEN: Soft, non-tender, non-distended EXTREMITIES:  No edema; No deformity   ASSESSMENT AND PLAN:    SVT  Atrial Tachycardia  -asymptomatic *** -very brief runs -avoid stimulants, stress, ensure adequate hydration   -ollow up ECHO -could consider BB if becomes symptomatic   OSA  -CPAP compliance encouraged   Dependent Edema -lasix  20 mg daily initiated at last visit  -improved LE edema ? ***  -TTE ordered at last visit / pending      Graduate from EP ??  Follow up with {EPMDS:28135::EP Team} {EPFOLLOW UP:28173}  Signed, Daphne Barrack, NP-C, AGACNP-BC  [facility] - Elect[facility]iology  02/01/2024, 7:59 AM

## 2024-02-03 ENCOUNTER — Ambulatory Visit: Attending: Pulmonary Disease | Admitting: Pulmonary Disease

## 2024-02-03 ENCOUNTER — Other Ambulatory Visit (HOSPITAL_COMMUNITY): Payer: Self-pay

## 2024-02-03 ENCOUNTER — Encounter: Payer: Self-pay | Admitting: Pulmonary Disease

## 2024-02-03 ENCOUNTER — Other Ambulatory Visit (HOSPITAL_BASED_OUTPATIENT_CLINIC_OR_DEPARTMENT_OTHER): Payer: Self-pay

## 2024-02-03 VITALS — BP 124/86 | HR 99 | Ht 71.0 in | Wt 240.0 lb

## 2024-02-03 DIAGNOSIS — I471 Supraventricular tachycardia, unspecified: Secondary | ICD-10-CM | POA: Diagnosis not present

## 2024-02-03 DIAGNOSIS — G4733 Obstructive sleep apnea (adult) (pediatric): Secondary | ICD-10-CM

## 2024-02-03 NOTE — Patient Instructions (Signed)
 Medication Instructions:  Your physician recommends that you continue on your current medications as directed. Please refer to the Current Medication list given to you today.  *If you need a refill on your cardiac medications before your next appointment, please call your pharmacy*  Lab Work: NONE If you have labs (blood work) drawn today and your tests are completely normal, you will receive your results only by: MyChart Message (if you have MyChart) OR A paper copy in the mail If you have any lab test that is abnormal or we need to change your treatment, we will call you to review the results.  Testing/Procedures: NONE  Follow-Up: At [facility], you and your health needs are our priority.  As part of our continuing mission to provide you with exceptional heart care, our providers are all part of one team.  This team includes your primary Cardiologist (physician) and Advanced Practice Providers or APPs (Physician Assistants and Nurse Practitioners) who all work together to provide you with the care you need, when you need it.  Your next appointment:   BASED ON TEST RESULTS   We recommend signing up for the patient portal called MyChart.  Sign up information is provided on this After Visit Summary.  MyChart is used to connect with patients for Virtual Visits (Telemedicine).  Patients are able to view lab/test results, encounter notes, upcoming appointments, etc.  Non-urgent messages can be sent to your provider as well.   To learn more about what you can do with MyChart, go to [email or web address].

## 2024-02-22 ENCOUNTER — Ambulatory Visit (HOSPITAL_COMMUNITY)
Admission: RE | Admit: 2024-02-22 | Discharge: 2024-02-22 | Disposition: A | Discharge status: Home / Self Care | Source: Ambulatory Visit | Attending: Cardiology | Admitting: Cardiology

## 2024-02-22 DIAGNOSIS — G4730 Sleep apnea, unspecified: Secondary | ICD-10-CM | POA: Diagnosis not present

## 2024-02-22 DIAGNOSIS — I471 Supraventricular tachycardia, unspecified: Secondary | ICD-10-CM | POA: Insufficient documentation

## 2024-02-22 DIAGNOSIS — I10 Essential (primary) hypertension: Secondary | ICD-10-CM | POA: Insufficient documentation

## 2024-02-22 DIAGNOSIS — E119 Type 2 diabetes mellitus without complications: Secondary | ICD-10-CM | POA: Diagnosis not present

## 2024-02-22 DIAGNOSIS — I340 Nonrheumatic mitral (valve) insufficiency: Secondary | ICD-10-CM

## 2024-02-22 LAB — ECHOCARDIOGRAM COMPLETE
Area-P 1/2: 5.31 cm2
S' Lateral: 2.1 cm

## 2024-02-24 ENCOUNTER — Ambulatory Visit: Payer: Self-pay | Admitting: Cardiovascular Disease

## 2024-05-27 NOTE — Therapy (Incomplete)
 " OUTPATIENT PHYSICAL THERAPY THORACOLUMBAR EVALUATION   Patient Name: James Gibbs MRN: [id number] DOB:[birth year], 69 y.o., male Today's Date: 05/27/2024  END OF SESSION:   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    Malignant hypertension    Mitral valve anterior leaflet prolapse    Mixed hyperlipidemia    Multiple sclerosis    Past Surgical History:  Procedure Laterality Date   COLONOSCOPY WITH PROPOFOL  N/A 12/17/2018   Procedure: COLONOSCOPY WITH PROPOFOL ;  Surgeon: Donnald Charleston, MD;  Location: [facility];  Service: Endoscopy;  Laterality: N/A;   ESOPHAGOGASTRODUODENOSCOPY (EGD) WITH PROPOFOL  N/A 12/14/2018   Procedure: ESOPHAGOGASTRODUODENOSCOPY (EGD) MODERATE SEDATION;  Surgeon: Donnald Charleston, MD;  Location: [facility];  Service: Endoscopy;  Laterality: N/A;   GIVENS CAPSULE STUDY N/A 12/18/2018   Procedure: GIVENS CAPSULE STUDY;  Surgeon: Burnette Elsie, MD;  Location: [facility];  Service: Endoscopy;  Laterality: N/A;   none     Patient Active Problem List   Diagnosis Date Noted   Hyperlipidemia associated with type 2 diabetes mellitus (HCC) 10/07/2023   Multiple sclerosis exacerbation 07/26/2023   Right leg weakness 03/26/2023   Hypokalemia 09/22/2022   Constipation 09/22/2022   Type 2 diabetes mellitus (HCC) 09/22/2022   Generalized weakness 09/04/2022   Multiple sclerosis 01/30/2022   Hypertension associated with diabetes (HCC) 12/13/2018   Gout 12/13/2018   HYPERTENSION, UNCONTROLLED 06/04/2009   OBESITY, UNSPECIFIED 08/15/2008   MITRAL REGURGITATION 08/15/2008   MITRAL VALVE PROLAPSE 08/15/2008    [facility]   REFERRING PROVIDER: Armida Corinthia FORBES DOUGLAS, MD   REFERRING DIAG: [id number] (ICD-10-CM) - Other intervertebral disc degeneration, lumbosacral region with discogenic back pain and lower extremity pain   Rationale for Evaluation and Treatment:  Rehabilitation  THERAPY DIAG:  No diagnosis found.  ONSET DATE: ***  SUBJECTIVE:                                                                                                                                                                                           SUBJECTIVE STATEMENT: ***  PERTINENT HISTORY:  ***  PAIN:  Are you having pain? Yes: NPRS scale: *** Pain location: *** Pain description: *** Aggravating factors: *** Relieving factors: ***  PRECAUTIONS: {Therapy precautions:24002}  RED FLAGS: {PT Red Flags:29287}   WEIGHT BEARING RESTRICTIONS: {Yes ***/No:24003}  FALLS:  Has patient fallen in last 6 months? {fallsyesno:27318}  LIVING ENVIRONMENT: Lives with: {[facility] lives with:25569::lives with their family} Lives in: {Lives in:25570} Stairs: {opstairs:27293} Has following equipment at home: {Assistive devices:23999}  OCCUPATION: ***  PLOF: {PLOF:24004}  PATIENT GOALS: ***  NEXT MD VISIT: ***  OBJECTIVE:  Note: Objective measures were completed at Evaluation unless otherwise noted.  DIAGNOSTIC FINDINGS:  CT IMPRESSION: 1. 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 No new or contrast-enhancing lesions. 2. Unchanged moderate C5-6 spinal canal stenosis and mild bilateral neural foraminal stenosis. 3. Unchanged clumping of the distal cauda equina at the lumbosacral junction. This may be due to mass effect from an arachnoid web or a sequela of arachnoiditis. No abnormal enhancement. 4. Mild spinal canal stenosis at L2-L3 with moderate right and mild left neural foraminal stenosis. 5. Moderate left L5-S1 neural foraminal stenosis.  PATIENT SURVEYS:  Modified Oswestry:  MODIFIED OSWESTRY DISABILITY SCALE  Date: *** Score  Pain intensity {ODI 1:32962}  2. Personal care (washing, dressing, etc.) {ODI 2:32963}  3. Lifting {ODI 3:32964}  4. Walking {ODI 4:32965}  5. Sitting {ODI 5:32966}  6.  Standing {ODI 6:32967}  7. Sleeping {ODI 7:32968}  8. Social Life {ODI 8:32969}  9. Traveling {ODI 9:32970}  10. Employment/ Homemaking {ODI 10:32971}  Total ***/50   Interpretation of scores: Score Category Description  0-20% Minimal Disability The patient can cope with most living activities. Usually no treatment is indicated apart from advice on lifting, sitting and exercise  21-40% Moderate Disability The patient experiences more pain and difficulty with sitting, lifting and standing. Travel and social life are more difficult and they may be disabled from work. Personal care, sexual activity and sleeping are not grossly affected, and the patient can usually be managed by conservative means  41-60% Severe Disability Pain remains the main problem in this group, but activities of daily living are affected. These patients require a detailed investigation  61-80% Crippled Back pain impinges on all aspects of the patients life. Positive intervention is required  81-100% Bed-bound These patients are either bed-bound or exaggerating their symptoms  Bluford FORBES Zoe DELENA Karon DELENA, et al. Surgery versus conservative management of stable thoracolumbar fracture: the PRESTO feasibility RCT. [place] ([place]): [employer]; 2021 Nov. [facility] Technology Assessment, No. 25.62.) Appendix 3, Oswestry Disability Index category descriptors. Available from: Findjewelers.cz  Minimally Clinically Important Difference (MCID) = 12.8%  COGNITION: Overall cognitive status: Within functional limits for tasks assessed     SENSATION: {sensation:27233}  MUSCLE LENGTH: Hamstrings: Right *** deg; Left *** deg Debby test: Right *** deg; Left *** deg  POSTURE: {posture:25561}  PALPATION: ***  LUMBAR ROM:   AROM eval  Flexion   Extension   Right lateral flexion   Left lateral flexion   Right rotation   Left rotation    (Blank rows = not tested)  LOWER EXTREMITY  ROM:     {AROM/PROM:27142}  Right eval Left eval  Hip flexion    Hip extension    Hip abduction    Hip adduction    Hip internal rotation    Hip external rotation    Knee flexion    Knee extension    Ankle dorsiflexion    Ankle plantarflexion    Ankle inversion    Ankle eversion     (Blank rows = not tested)  LOWER EXTREMITY MMT:    MMT Right eval Left eval  Hip flexion    Hip extension    Hip abduction    Hip adduction    Hip internal rotation    Hip external rotation    Knee flexion    Knee extension    Ankle dorsiflexion    Ankle plantarflexion  Ankle inversion    Ankle eversion     (Blank rows = not tested)  LUMBAR SPECIAL TESTS:  {lumbar special test:25242}  FUNCTIONAL TESTS:  {Functional tests:24029}  GAIT: Distance walked: *** Assistive device utilized: {Assistive devices:23999} Level of assistance: {Levels of assistance:24026} Comments: ***  TREATMENT DATE: ***                                                                                                                              05/31/23 See pt ed and HEP    PATIENT EDUCATION:  Education details: PT eval findings, anticipated POC, initial HEP, and ***   Person educated: Patient Education method: Explanation, Demonstration, Tactile cues, Verbal cues, and Handouts Education comprehension: verbalized understanding and returned demonstration  HOME EXERCISE PROGRAM: ***  ASSESSMENT:  CLINICAL IMPRESSION: Patient is a 69 y.o. male who was seen today for physical therapy evaluation and treatment for ***.   OBJECTIVE IMPAIRMENTS: {opptimpairments:25111}.   ACTIVITY LIMITATIONS: {activitylimitations:27494}  PARTICIPATION LIMITATIONS: {participationrestrictions:25113}  PERSONAL FACTORS: {Personal factors:25162} are also affecting patient's functional outcome.   REHAB POTENTIAL: {rehabpotential:25112}  CLINICAL DECISION MAKING: {clinical decision making:25114}  EVALUATION COMPLEXITY:  {Evaluation complexity:25115}   GOALS: Goals reviewed with patient? {yes/no:20286}  SHORT TERM GOALS: Target date: {follow up:25551}   Patient will be independent with initial HEP.  Baseline: *** Goal status: INITIAL  2.  Patient will report centralization of radicular symptoms.  Baseline: *** Goal status: INITIAL  3.  *** Baseline: *** Goal status: INITIAL   LONG TERM GOALS: Target date: {follow up:25551}   Patient will be independent with advanced/ongoing HEP to improve outcomes and carryover.  Baseline: *** Goal status: INITIAL  2.  Patient will report ***% improvement in low back pain with ADLs to improve QOL.  Baseline: *** Goal status: INITIAL  3.  Patient will demonstrate full/functional pain free lumbar ROM to perform ADLs.   Baseline: *** Goal status: INITIAL  4.  Patient will demonstrate improved strength to ***/5 to normalize ***. Baseline: *** Goal status: INITIAL  5.  Patient will score >= 6 points down on the Modified Oswestry demonstrating improved functional ability.  Baseline: *** Goal status: INITIAL  6.  Patient able to ***. Baseline: *** Goal status: INITIAL  7.  Patient to demonstrate ability to achieve and maintain good spinal alignment/posturing and body mechanics needed for daily activities. Baseline: *** Goal status: INITIAL  8. Patient will *** Baseline: *** Goal status: INITIAL   PLAN:  PT FREQUENCY: {rehab frequency:25116}  PT DURATION: {rehab duration:25117}  PLANNED INTERVENTIONS: {rehab planned interventions:25118::97110-Therapeutic exercises,97530- Therapeutic [phone]- Neuromuscular re-education,97535- Self Rjmz,02859- Manual therapy,Patient/Family education}.  PLAN FOR NEXT SESSION: PIERRETTE Mliss Cummins, PT 05/27/2024 12:12 PM   "

## 2024-05-30 ENCOUNTER — Telehealth: Payer: Self-pay | Admitting: Physical Therapy

## 2024-05-30 ENCOUNTER — Ambulatory Visit: Attending: Neurology | Admitting: Physical Therapy

## 2024-05-30 DIAGNOSIS — M4802 Spinal stenosis, cervical region: Secondary | ICD-10-CM | POA: Insufficient documentation

## 2024-05-30 DIAGNOSIS — G35D Multiple sclerosis, unspecified: Secondary | ICD-10-CM | POA: Insufficient documentation

## 2024-05-30 DIAGNOSIS — M51372 Other intervertebral disc degeneration, lumbosacral region with discogenic back pain and lower extremity pain: Secondary | ICD-10-CM | POA: Insufficient documentation

## 2024-05-30 NOTE — Telephone Encounter (Signed)
 Phoned pt about missed evaluation and let him know we had more openings today if her wanted to get in.

## 2024-06-09 ENCOUNTER — Ambulatory Visit: Admitting: Physical Therapy

## 2024-06-09 ENCOUNTER — Other Ambulatory Visit: Payer: Self-pay

## 2024-06-09 ENCOUNTER — Encounter: Payer: Self-pay | Admitting: Physical Therapy

## 2024-06-09 DIAGNOSIS — M5459 Other low back pain: Secondary | ICD-10-CM

## 2024-06-09 DIAGNOSIS — R262 Difficulty in walking, not elsewhere classified: Secondary | ICD-10-CM

## 2024-06-09 DIAGNOSIS — M6281 Muscle weakness (generalized): Secondary | ICD-10-CM

## 2024-06-09 NOTE — Therapy (Signed)
 " OUTPATIENT PHYSICAL THERAPY THORACOLUMBAR EVALUATION   Patient Name: James Gibbs MRN: [id number] DOB:[birth year], 69 y.o., male Today's Date: 06/09/2024  END OF SESSION:  PT End of Session - 06/09/24 1550     Visit Number 1    Date for Recertification  09/01/24    Authorization Type [facility] 15 visits 12/12-4/11    PT Start Time 1500   late   PT Stop Time 1555    PT Time Calculation (min) 55 min    Activity Tolerance Patient limited by fatigue;Other (comment)          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    Malignant hypertension    Mitral valve anterior leaflet prolapse    Mixed hyperlipidemia    Multiple sclerosis    Past Surgical History:  Procedure Laterality Date   COLONOSCOPY WITH PROPOFOL  N/A 12/17/2018   Procedure: COLONOSCOPY WITH PROPOFOL ;  Surgeon: Donnald Charleston, MD;  Location: [facility];  Service: Endoscopy;  Laterality: N/A;   ESOPHAGOGASTRODUODENOSCOPY (EGD) WITH PROPOFOL  N/A 12/14/2018   Procedure: ESOPHAGOGASTRODUODENOSCOPY (EGD) MODERATE SEDATION;  Surgeon: Donnald Charleston, MD;  Location: [facility];  Service: Endoscopy;  Laterality: N/A;   GIVENS CAPSULE STUDY N/A 12/18/2018   Procedure: GIVENS CAPSULE STUDY;  Surgeon: Burnette Elsie, MD;  Location: [facility];  Service: Endoscopy;  Laterality: N/A;   none     Patient Active Problem List   Diagnosis Date Noted   Hyperlipidemia associated with type 2 diabetes mellitus (HCC) 10/07/2023   Multiple sclerosis exacerbation 07/26/2023   Right leg weakness 03/26/2023   Hypokalemia 09/22/2022   Constipation 09/22/2022   Type 2 diabetes mellitus (HCC) 09/22/2022   Generalized weakness 09/04/2022   Multiple sclerosis 01/30/2022   Hypertension associated with diabetes (HCC) 12/13/2018   Gout 12/13/2018   HYPERTENSION, UNCONTROLLED 06/04/2009   OBESITY, UNSPECIFIED 08/15/2008   MITRAL REGURGITATION 08/15/2008   MITRAL VALVE  PROLAPSE 08/15/2008    THERAPY DIAG:  Back pain  ONSET DATE: ongoing/worsening over time  SUBJECTIVE:                                                                                                                                                                                           SUBJECTIVE STATEMENT: Years long history of back pain.  Using forearm walker Upwalker secondary to neurologic changes related to MS.  I can't do inclines. If no incline I can walk a block or 2.  Bedroom to living room with walker mostly.  Tried stationary bike and had excruciating pain.  I've been dealing with a lot  of stuff with MS, diabetes, high blood pressure. Difficulty lifting my right leg related to MS.  Left thumb and index finger numbness  Was going to church but that has stopped  Incontinence plays a big part in my social life  My wife died 10 years ago Sits for ADLs Not sleeping well lately   PERTINENT HISTORY:  MS; HTN, Diabetes Unable to drive-friend drove him today  PAIN:  Are you having pain? Yes NPRS scale: 5/10 Pain location: length of spine Pain orientation: Bilateral  PAIN TYPE: aching Pain description: intermittent  Aggravating factors: if I stumble or step in a hole, bending over, going up an incline, standing/walking Relieving factors: sitting, takes pain medication    PRECAUTIONS: Fall, avoid overfatigue related to MS    WEIGHT BEARING RESTRICTIONS: No  FALLS:  Has patient fallen in last 6 months? Yes. Number of falls 5, last time was getting out of the shower and my knees get weak, pushes [employer] button and fire dept helped up  LIVING ENVIRONMENT: has an aid who helps with meals and get in/out of the shower; dries off and helps with medicine Has following equipment at home: Upwalker forearm walker and also has RW, has a manual W/C, tried to get a scooter but never could find something affordable, has hospital bed   PLOF: Independent with household mobility  without device  PATIENT GOALS:  my daughter is getting married in April, I want to be able to stand at the wedding. Someone else will walk her down the aisle   OBJECTIVE:  Note: Objective measures were completed at Evaluation unless otherwise noted.  DIAGNOSTIC FINDINGS:  Disc levels:   L1-L2: Normal disc space and facet joints. No spinal canal stenosis. No neural foraminal stenosis.   L2-L3: Disc space narrowing and small bulge with mild facet hypertrophy. Mild spinal canal stenosis. Moderate right and mild left neural foraminal stenosis.   L3-L4: Small disc bulge with endplate spurring. No spinal canal stenosis. No neural foraminal stenosis.   L4-L5: Small disc bulge with endplate spurring. No spinal canal stenosis. Mild bilateral neural foraminal stenosis.   L5-S1: Small disc bulge. No spinal canal stenosis. Moderate left neural foraminal stenosis.   Visualized sacrum: Normal.   IMPRESSION: 1. 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 No new or contrast-enhancing lesions. 2. Unchanged moderate C5-6 spinal canal stenosis and mild bilateral neural foraminal stenosis. 3. Unchanged clumping of the distal cauda equina at the lumbosacral junction. This may be due to mass effect from an arachnoid web or a sequela of arachnoiditis. No abnormal enhancement. 4. Mild spinal canal stenosis at L2-L3 with moderate right and mild left neural foraminal stenosis. 5. Moderate left L5-S1 neural foraminal stenosis.  PATIENT SURVEYS:  Modified Oswestry:  MODIFIED OSWESTRY DISABILITY SCALE  Date: 1/15 Score  Pain intensity 4 =  Pain medication provides me with little relief from pain.  2. Personal care (washing, dressing, etc.) 3 =  I need help, but I am able to manage most of my personal care.  3. Lifting 5 =  I cannot lift or carry anything at all.  4. Walking 2 =  Pain prevents me from walking more than  mile.  5. Sitting 0 =  I can  sit in any chair as long as I like.  6. Standing 4 =  Pain prevents me from standing more than 10 minutes.  7. Sleeping 2 =  Even when I take pain medication, I sleep less  than 6 hours  8. Social Life 2 = Pain prevents me from participating in more energetic activities (eg. sports, dancing).  9. Traveling Not answered  10. Employment/ Homemaking Not answered  Total 22/40= 55%   Interpretation of scores: Score Category Description  0-20% Minimal Disability The patient can cope with most living activities. Usually no treatment is indicated apart from advice on lifting, sitting and exercise  21-40% Moderate Disability The patient experiences more pain and difficulty with sitting, lifting and standing. Travel and social life are more difficult and they may be disabled from work. Personal care, sexual activity and sleeping are not grossly affected, and the patient can usually be managed by conservative means  41-60% Severe Disability Pain remains the main problem in this group, but activities of daily living are affected. These patients require a detailed investigation  61-80% Crippled Back pain impinges on all aspects of the patients life. Positive intervention is required  81-100% Bed-bound These patients are either bed-bound or exaggerating their symptoms  Bluford FORBES Zoe DELENA Karon DELENA, et al. Surgery versus conservative management of stable thoracolumbar fracture: the PRESTO feasibility RCT. [place] ([place]): [employer]; 2021 Nov. [facility] Technology Assessment, No. 25.62.) Appendix 3, Oswestry Disability Index category descriptors. Available from: Findjewelers.cz  Minimally Clinically Important Difference (MCID) = 12.8%  COGNITION: Overall cognitive status: Within functional limits for tasks assessed      POSTURE: stands forward flexed with attempt to stand more erect, he reports increased spinal pain    LUMBAR ROM: seated he his able to forward  flex to reach his ankles; able to use his abdominals to sit up from the back of the chair without UE assist   LOWER EXTREMITY ROM:   assessed in sitting: able to do partial range of motion for ankle PF and DF, able to fully extend right/left knees, able to actively hip flex to 95 degrees right/left; uses Ues to guide Les to figure 4 position  LOWER EXTREMITY MMT:    FUNCTIONAL TESTS:  Heavy UE assist to rise from a standard chair Able to stand 20 sec but then then needs to sit (touches walker for balance)  GAIT:  Comments: bent over on forearms with upright forearm walker;  scissoring gait, decreased knee extension on right   TREATMENT DATE: 06/09/2024 evaluation          Used spinal model to discuss spinal stenosis and plan for appropriate exercise selection  Discussed working on bouts of standing to prepare for his daughter's wedding but also that some sitting breaks would most likely be necessary                                                                                                                  PATIENT EDUCATION:  Education details: Educated patient on anatomy and physiology of current symptoms, prognosis, plan of care as well as initial self care strategies to promote recovery Person educated: Patient Education method: Explanation Education comprehension: verbalized understanding  HOME EXERCISE PROGRAM: To be started  ASSESSMENT:  CLINICAL IMPRESSION: Patient is a 69 y.o. male who was seen today for physical therapy evaluation and treatment for back pain (up and down the length of his spinal column).  He has a long history of back pain but has worsened over time.  Medical history significant for MS, diabetes and HTN. The patient uses a heavy frame forearm walker on a full time basis and has an aide for meals, medication and assistance in/out of the shower.  His pain is increased with a stumble or misstep in a hole, bending over, going up an incline, and  standing/walking and better with sitting. Forward flexed posture in standing and with gait with increased pain with standing erect and limited to 20 seconds today.  His goal is to be able to stand for a longer period of time for his daughter's wedding in April.     OBJECTIVE IMPAIRMENTS: decreased activity tolerance, decreased mobility, difficulty walking, decreased strength, impaired perceived functional ability, postural dysfunction, and pain.   ACTIVITY LIMITATIONS: transfers, bathing, toileting, dressing, hygiene/grooming, and locomotion level  PARTICIPATION LIMITATIONS: community activity and church  PERSONAL FACTORS: 3+ comorbidities: MS,  are also affecting patient's functional outcome; non -driver transportation may be an issue  REHAB POTENTIAL: Good  CLINICAL DECISION MAKING: Evolving/moderate complexity  EVALUATION COMPLEXITY: Moderate   GOALS: Goals reviewed with patient? Yes  SHORT TERM GOALS: Target date: 07/07/2024    The patient will demonstrate knowledge of basic self care strategies and exercises to promote healing  Baseline: Goal status: INITIAL  2.  The patient will report a 25% improvement in pain levels with functional activities which are currently difficult including a stumble or misstep in a hole, bending over, going up an incline, standing/walking Baseline:  Goal status: INITIAL  3.  The patient will have improved trunk flexor and extensor muscle strength needed to stand for 1 minute Baseline:  Goal status: INITIAL  4.  The patient will be able to perform 20 min of exercise with pain at a tolerable level Baseline:  Goal status: INITIAL   LONG TERM GOALS: Target date: 08/04/2024   The patient will be independent in a safe self progression of a home exercise program to promote further recovery of function  Baseline:  Goal status: INITIAL  2.  The patient will report a 50% improvement in pain levels with functional activities which are currently  difficult including a stumble or misstep in a hole, bending over, going up an incline, standing/walking Baseline:  Goal status: INITIAL  3.  The patient will have improved trunk flexor and extensor muscle strength needed to stand for 2 minutes Baseline:  Goal status: INITIAL  4.  The patient will be able to perform 40 min of exercise with pain at a tolerable level Baseline:  Goal status: INITIAL  5.  Modified Oswestry Index improved to    45%   indicating improved function with less pain Baseline:  Goal status: INITIAL   PLAN:  PT FREQUENCY: 1-2x/week  PT DURATION: 12 weeks  PLANNED INTERVENTIONS: 97164- PT Re-evaluation, 97750- Physical Performance Testing, 97110-Therapeutic exercises, 97530- Therapeutic activity, 97112- Neuromuscular re-education, 97535- Self Care, 02859- Manual therapy, [id number]- Aquatic Therapy, H9716- Electrical stimulation (unattended), [phone]- Electrical stimulation (manual), [id number]- Ultrasound, [id number]- Traction (mechanical), [id number]- Ionotophoresis 4mg /ml Dexamethasone, 20560 (1-2 muscles), 20561 (3+ muscles)- Dry Needling, Patient/Family education, Taping, Joint mobilization, Spinal mobilization, Cryotherapy, and Moist heat.  PLAN FOR NEXT SESSION: lumbar stenosis: seated ex's for stretching and strengthening;  short periods of time standing progression  to meet patient's goal of standing for his daughter's wedding in April  Glade Pesa, PT 06/09/24 7:00 PM Phone: [phone] Fax: [phone]    "

## 2024-06-14 ENCOUNTER — Ambulatory Visit: Admitting: Rehabilitation

## 2024-06-14 ENCOUNTER — Telehealth: Payer: Self-pay

## 2024-06-14 ENCOUNTER — Encounter: Payer: Self-pay | Admitting: Rehabilitation

## 2024-06-14 ENCOUNTER — Ambulatory Visit

## 2024-06-14 DIAGNOSIS — M6281 Muscle weakness (generalized): Secondary | ICD-10-CM

## 2024-06-14 DIAGNOSIS — M5459 Other low back pain: Secondary | ICD-10-CM

## 2024-06-14 DIAGNOSIS — R262 Difficulty in walking, not elsewhere classified: Secondary | ICD-10-CM

## 2024-06-14 DIAGNOSIS — R5381 Other malaise: Secondary | ICD-10-CM

## 2024-06-14 NOTE — Therapy (Signed)
 " OUTPATIENT PHYSICAL THERAPY THORACOLUMBAR    Patient Name: James Gibbs MRN: [id number] DOB:09-Jul-1955, 69 y.o., male Today's Date: 06/14/2024  END OF SESSION:  PT End of Session - 06/14/24 1600     Visit Number 2    Authorization Type [facility] 15 visits 12/12-4/11    Authorization - Visit Number 2    Authorization - Number of Visits 15    PT Start Time 1602    PT Stop Time 1647    PT Time Calculation (min) 45 min    Activity Tolerance Patient tolerated treatment well    Behavior During Therapy WFL for tasks assessed/performed           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    Malignant hypertension    Mitral valve anterior leaflet prolapse    Mixed hyperlipidemia    Multiple sclerosis    Past Surgical History:  Procedure Laterality Date   COLONOSCOPY WITH PROPOFOL  N/A 12/17/2018   Procedure: COLONOSCOPY WITH PROPOFOL ;  Surgeon: Donnald Charleston, MD;  Location: [facility];  Service: Endoscopy;  Laterality: N/A;   ESOPHAGOGASTRODUODENOSCOPY (EGD) WITH PROPOFOL  N/A 12/14/2018   Procedure: ESOPHAGOGASTRODUODENOSCOPY (EGD) MODERATE SEDATION;  Surgeon: Donnald Charleston, MD;  Location: [facility];  Service: Endoscopy;  Laterality: N/A;   GIVENS CAPSULE STUDY N/A 12/18/2018   Procedure: GIVENS CAPSULE STUDY;  Surgeon: Burnette Elsie, MD;  Location: [facility];  Service: Endoscopy;  Laterality: N/A;   none     Patient Active Problem List   Diagnosis Date Noted   Hyperlipidemia associated with type 2 diabetes mellitus (HCC) 10/07/2023   Multiple sclerosis exacerbation 07/26/2023   Right leg weakness 03/26/2023   Hypokalemia 09/22/2022   Constipation 09/22/2022   Type 2 diabetes mellitus (HCC) 09/22/2022   Generalized weakness 09/04/2022   Multiple sclerosis 01/30/2022   Hypertension associated with diabetes (HCC) 12/13/2018   Gout 12/13/2018   HYPERTENSION, UNCONTROLLED 06/04/2009   OBESITY,  UNSPECIFIED 08/15/2008   MITRAL REGURGITATION 08/15/2008   MITRAL VALVE PROLAPSE 08/15/2008    THERAPY DIAG:  Back pain  ONSET DATE: ongoing/worsening over time  SUBJECTIVE:                                                                                                                                                                                           SUBJECTIVE STATEMENT: Nothing new.  I would prefer to do some exercise seated.     EVAL: Years long history of back pain.  Using forearm walker Upwalker secondary to neurologic changes related to MS.  I can't do inclines.  If no incline I can walk a block or 2.  Bedroom to living room with walker mostly.  Tried stationary bike and had excruciating pain.  I've been dealing with a lot of stuff with MS, diabetes, high blood pressure. Difficulty lifting my right leg related to MS.  Left thumb and index finger numbness  Was going to church but that has stopped  Incontinence plays a big part in my social life  My wife died 10 years ago Sits for ADLs Not sleeping well lately   PERTINENT HISTORY:  MS; HTN, Diabetes Unable to drive-friend drove him today  PAIN:  Are you having pain? Yes NPRS scale: 5/10 Pain location: length of spine Pain orientation: Bilateral  PAIN TYPE: aching Pain description: intermittent  Aggravating factors: if I stumble or step in a hole, bending over, going up an incline, standing/walking Relieving factors: sitting, takes pain medication    PRECAUTIONS: Fall, avoid overfatigue related to MS  WEIGHT BEARING RESTRICTIONS: No  FALLS:  Has patient fallen in last 6 months? Yes. Number of falls 5, last time was getting out of the shower and my knees get weak, pushes [employer] button and fire dept helped up  LIVING ENVIRONMENT: has an aid who helps with meals and get in/out of the shower; dries off and helps with medicine Has following equipment at home: Upwalker forearm walker and also has RW, has a manual  W/C, tried to get a scooter but never could find something affordable, has hospital bed   PLOF: Independent with household mobility without device  PATIENT GOALS:  my daughter is getting married in April, I want to be able to stand at the wedding. Someone else will walk her down the aisle - I would like to stand up to   OBJECTIVE:  Note: Objective measures were completed at Evaluation unless otherwise noted.  DIAGNOSTIC FINDINGS:  Disc levels:   L1-L2: Normal disc space and facet joints. No spinal canal stenosis. No neural foraminal stenosis.   L2-L3: Disc space narrowing and small bulge with mild facet hypertrophy. Mild spinal canal stenosis. Moderate right and mild left neural foraminal stenosis.   L3-L4: Small disc bulge with endplate spurring. No spinal canal stenosis. No neural foraminal stenosis.   L4-L5: Small disc bulge with endplate spurring. No spinal canal stenosis. Mild bilateral neural foraminal stenosis.   L5-S1: Small disc bulge. No spinal canal stenosis. Moderate left neural foraminal stenosis.   Visualized sacrum: Normal.   IMPRESSION: 1. 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 No new or contrast-enhancing lesions. 2. Unchanged moderate C5-6 spinal canal stenosis and mild bilateral neural foraminal stenosis. 3. Unchanged clumping of the distal cauda equina at the lumbosacral junction. This may be due to mass effect from an arachnoid web or a sequela of arachnoiditis. No abnormal enhancement. 4. Mild spinal canal stenosis at L2-L3 with moderate right and mild left neural foraminal stenosis. 5. Moderate left L5-S1 neural foraminal stenosis.  PATIENT SURVEYS:  Modified Oswestry:  MODIFIED OSWESTRY DISABILITY SCALE  Date: 1/15 Score  Pain intensity 4 =  Pain medication provides me with little relief from pain.  2. Personal care (washing, dressing, etc.) 3 =  I need help, but I am able to manage  most of my personal care.  3. Lifting 5 =  I cannot lift or carry anything at all.  4. Walking 2 =  Pain prevents me from walking more than  mile.  5. Sitting 0 =  I  can sit in any chair as long as I like.  6. Standing 4 =  Pain prevents me from standing more than 10 minutes.  7. Sleeping 2 =  Even when I take pain medication, I sleep less than 6 hours  8. Social Life 2 = Pain prevents me from participating in more energetic activities (eg. sports, dancing).  9. Traveling Not answered  10. Employment/ Homemaking Not answered  Total 22/40= 55%   Interpretation of scores: Score Category Description  0-20% Minimal Disability The patient can cope with most living activities. Usually no treatment is indicated apart from advice on lifting, sitting and exercise  21-40% Moderate Disability The patient experiences more pain and difficulty with sitting, lifting and standing. Travel and social life are more difficult and they may be disabled from work. Personal care, sexual activity and sleeping are not grossly affected, and the patient can usually be managed by conservative means  41-60% Severe Disability Pain remains the main problem in this group, but activities of daily living are affected. These patients require a detailed investigation  61-80% Crippled Back pain impinges on all aspects of the patients life. Positive intervention is required  81-100% Bed-bound These patients are either bed-bound or exaggerating their symptoms  Bluford FORBES Zoe DELENA Karon DELENA, et al. Surgery versus conservative management of stable thoracolumbar fracture: the PRESTO feasibility RCT. [place] ([place]): [employer]; 2021 Nov. [facility] Technology Assessment, No. 25.62.) Appendix 3, Oswestry Disability Index category descriptors. Available from: Findjewelers.cz  Minimally Clinically Important Difference (MCID) = 12.8%  COGNITION: Overall cognitive status: Within functional limits  for tasks assessed     POSTURE: stands forward flexed with attempt to stand more erect, he reports increased spinal pain  LUMBAR ROM: seated he his able to forward flex to reach his ankles; able to use his abdominals to sit up from the back of the chair without UE assist  LOWER EXTREMITY ROM:   assessed in sitting: able to do partial range of motion for ankle PF and DF, able to fully extend right/left knees, able to actively hip flex to 95 degrees right/left; uses Ues to guide Les to figure 4 position  LOWER EXTREMITY MMT:    FUNCTIONAL TESTS:  Heavy UE assist to rise from a standard chair Able to stand 20 sec but then then needs to sit (touches walker for balance)  GAIT: Comments: bent over on forearms with upright forearm walker;  scissoring gait, decreased knee extension on right   TREATMENT DATE:  06/14/24 Attempted to sit on plinth but losing balance back and to the right, moved to the armchair OTAGO exercises:  Cervical retraction x 5 bil   No back support on chair retractions x 5  Ankle pumps x 10 with leg extended   LAQ 2x5 bil   Sitting forward in the chair without support 10 x 5   Discussed AFO which pt is interested and will talk to PCP  Seated march x 5bil   Seated ball squeeze 5 x 10 Lifting leg up over weight on the floor, hurdle was a bit too high 2x5 bil with more trouble on the Rt leg Sit to stand at platform walker x 30 and then stood again and walked out the door to the car.  Helped transfer into the car.   06/09/2024 evaluation          Used spinal model to discuss spinal stenosis and plan for appropriate exercise selection  Discussed working on bouts of standing to prepare  for his daughter's wedding but also that some sitting breaks would most likely be necessary                                                                                                                  PATIENT EDUCATION:  Education details: Educated patient on anatomy and physiology  of current symptoms, prognosis, plan of care as well as initial self care strategies to promote recovery Person educated: Patient Education method: Explanation Education comprehension: verbalized understanding  HOME EXERCISE PROGRAM: To be started  ASSESSMENT:  CLINICAL IMPRESSION: Pt tolerated treatment well.  He did have LOB just sitting on the table posteriorly to the Right and we moved to an arm chair for more stability.  Discussed how this would be good core work as tolerated.  He did walk down the hall prior to treatment to get to the waiting room and then was able to do his whole session.   OBJECTIVE IMPAIRMENTS: decreased activity tolerance, decreased mobility, difficulty walking, decreased strength, impaired perceived functional ability, postural dysfunction, and pain.   ACTIVITY LIMITATIONS: transfers, bathing, toileting, dressing, hygiene/grooming, and locomotion level  PARTICIPATION LIMITATIONS: community activity and church  PERSONAL FACTORS: 3+ comorbidities: MS,  are also affecting patient's functional outcome; non -driver transportation may be an issue  REHAB POTENTIAL: Good  CLINICAL DECISION MAKING: Evolving/moderate complexity  EVALUATION COMPLEXITY: Moderate   GOALS: Goals reviewed with patient? Yes  SHORT TERM GOALS: Target date: 07/07/2024   The patient will demonstrate knowledge of basic self care strategies and exercises to promote healing  Baseline: Goal status: INITIAL  2.  The patient will report a 25% improvement in pain levels with functional activities which are currently difficult including a stumble or misstep in a hole, bending over, going up an incline, standing/walking Baseline:  Goal status: INITIAL  3.  The patient will have improved trunk flexor and extensor muscle strength needed to stand for 1 minute Baseline:  Goal status: INITIAL  4.  The patient will be able to perform 20 min of exercise with pain at a tolerable level Baseline:   Goal status: INITIAL   LONG TERM GOALS: Target date: 08/04/2024   The patient will be independent in a safe self progression of a home exercise program to promote further recovery of function  Baseline:  Goal status: INITIAL  2.  The patient will report a 50% improvement in pain levels with functional activities which are currently difficult including a stumble or misstep in a hole, bending over, going up an incline, standing/walking Baseline:  Goal status: INITIAL  3.  The patient will have improved trunk flexor and extensor muscle strength needed to stand for 2 minutes Baseline:  Goal status: INITIAL  4.  The patient will be able to perform 40 min of exercise with pain at a tolerable level Baseline:  Goal status: INITIAL  5.  Modified Oswestry Index improved to    45%   indicating improved function with less pain Baseline:  Goal status: INITIAL   PLAN:  PT FREQUENCY: 1-2x/week  PT DURATION: 12 weeks  PLANNED INTERVENTIONS: 97164- PT Re-evaluation, 97750- Physical Performance Testing, 97110-Therapeutic exercises, 97530- Therapeutic activity, [id number]- Neuromuscular re-education, 97535- Self Care, [id number]- Manual therapy, [phone]- Aquatic Therapy, [phone]- Electrical stimulation (unattended), [phone]- Electrical stimulation (manual), [id number]- Ultrasound, [id number]- Traction (mechanical), [id number]- Ionotophoresis 4mg /ml Dexamethasone, 79439 (1-2 muscles), [id number] (3+ muscles)- Dry Needling, Patient/Family education, Taping, Joint mobilization, Spinal mobilization, Cryotherapy, and Moist heat.  PLAN FOR NEXT SESSION: lumbar stenosis: seated ex's for stretching and strengthening;  short periods of time standing progression to meet patient's goal of standing for his daughter's wedding in April  Glade Pesa, PT 06/14/24 5:24 PM Phone: [phone] Fax: [phone]    "

## 2024-06-14 NOTE — Telephone Encounter (Signed)
 PT called pt due to no-show for appt today.  Left voicemail.

## 2024-06-16 ENCOUNTER — Encounter: Payer: Self-pay | Admitting: Rehabilitation

## 2024-06-16 ENCOUNTER — Ambulatory Visit: Admitting: Rehabilitation

## 2024-06-16 DIAGNOSIS — R5381 Other malaise: Secondary | ICD-10-CM

## 2024-06-16 DIAGNOSIS — R262 Difficulty in walking, not elsewhere classified: Secondary | ICD-10-CM

## 2024-06-16 DIAGNOSIS — M6281 Muscle weakness (generalized): Secondary | ICD-10-CM

## 2024-06-16 DIAGNOSIS — M5459 Other low back pain: Secondary | ICD-10-CM

## 2024-06-16 NOTE — Therapy (Signed)
 " OUTPATIENT PHYSICAL THERAPY THORACOLUMBAR    Patient Name: James Gibbs MRN: [id number] DOB:1955-09-08, 69 y.o., male Today's Date: 06/16/2024  END OF SESSION:  PT End of Session - 06/16/24 1607     Visit Number 3    Date for Recertification  09/01/24    Authorization Type [facility] 15 visits 12/12-4/11    Authorization - Visit Number 3    Authorization - Number of Visits 15    PT Start Time 1500    PT Stop Time 1549    PT Time Calculation (min) 49 min    Activity Tolerance Patient tolerated treatment well    Behavior During Therapy WFL for tasks assessed/performed            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    Malignant hypertension    Mitral valve anterior leaflet prolapse    Mixed hyperlipidemia    Multiple sclerosis    Past Surgical History:  Procedure Laterality Date   COLONOSCOPY WITH PROPOFOL  N/A 12/17/2018   Procedure: COLONOSCOPY WITH PROPOFOL ;  Surgeon: Donnald Charleston, MD;  Location: [facility];  Service: Endoscopy;  Laterality: N/A;   ESOPHAGOGASTRODUODENOSCOPY (EGD) WITH PROPOFOL  N/A 12/14/2018   Procedure: ESOPHAGOGASTRODUODENOSCOPY (EGD) MODERATE SEDATION;  Surgeon: Donnald Charleston, MD;  Location: [facility];  Service: Endoscopy;  Laterality: N/A;   GIVENS CAPSULE STUDY N/A 12/18/2018   Procedure: GIVENS CAPSULE STUDY;  Surgeon: Burnette Elsie, MD;  Location: [facility];  Service: Endoscopy;  Laterality: N/A;   none     Patient Active Problem List   Diagnosis Date Noted   Hyperlipidemia associated with type 2 diabetes mellitus (HCC) 10/07/2023   Multiple sclerosis exacerbation 07/26/2023   Right leg weakness 03/26/2023   Hypokalemia 09/22/2022   Constipation 09/22/2022   Type 2 diabetes mellitus (HCC) 09/22/2022   Generalized weakness 09/04/2022   Multiple sclerosis 01/30/2022   Hypertension associated with diabetes (HCC) 12/13/2018   Gout 12/13/2018    HYPERTENSION, UNCONTROLLED 06/04/2009   OBESITY, UNSPECIFIED 08/15/2008   MITRAL REGURGITATION 08/15/2008   MITRAL VALVE PROLAPSE 08/15/2008    THERAPY DIAG:  Back pain  ONSET DATE: ongoing/worsening over time  SUBJECTIVE:                                                                                                                                                                                           SUBJECTIVE STATEMENT: Nothing new. Nothing last time was too hard.   EVAL: Years long history of back pain.  Using forearm walker Upwalker secondary to neurologic changes related to MS.  I can't do inclines. If no incline I can walk a block or 2.  Bedroom to living room with walker mostly.  Tried stationary bike and had excruciating pain.  I've been dealing with a lot of stuff with MS, diabetes, high blood pressure. Difficulty lifting my right leg related to MS.  Left thumb and index finger numbness  Was going to church but that has stopped  Incontinence plays a big part in my social life  My wife died 10 years ago Sits for ADLs Not sleeping well lately   PERTINENT HISTORY:  MS; HTN, Diabetes Unable to drive-friend drove him today  PAIN:  Are you having pain? Yes NPRS scale: 5/10 Pain location: length of spine Pain orientation: Bilateral  PAIN TYPE: aching Pain description: intermittent  Aggravating factors: if I stumble or step in a hole, bending over, going up an incline, standing/walking Relieving factors: sitting, takes pain medication    PRECAUTIONS: Fall, avoid overfatigue related to MS  WEIGHT BEARING RESTRICTIONS: No  FALLS:  Has patient fallen in last 6 months? Yes. Number of falls 5, last time was getting out of the shower and my knees get weak, pushes [employer] button and fire dept helped up  LIVING ENVIRONMENT: has an aid who helps with meals and get in/out of the shower; dries off and helps with medicine Has following equipment at home: Upwalker forearm  walker and also has RW, has a manual W/C, tried to get a scooter but never could find something affordable, has hospital bed   PLOF: Independent with household mobility without device  PATIENT GOALS:  my daughter is getting married in April, I want to be able to stand at the wedding. Someone else will walk her down the aisle - I would like to stand up to   OBJECTIVE:  Note: Objective measures were completed at Evaluation unless otherwise noted.  DIAGNOSTIC FINDINGS:  Disc levels:   L1-L2: Normal disc space and facet joints. No spinal canal stenosis. No neural foraminal stenosis.   L2-L3: Disc space narrowing and small bulge with mild facet hypertrophy. Mild spinal canal stenosis. Moderate right and mild left neural foraminal stenosis.   L3-L4: Small disc bulge with endplate spurring. No spinal canal stenosis. No neural foraminal stenosis.   L4-L5: Small disc bulge with endplate spurring. No spinal canal stenosis. Mild bilateral neural foraminal stenosis.   L5-S1: Small disc bulge. No spinal canal stenosis. Moderate left neural foraminal stenosis.   Visualized sacrum: Normal.   IMPRESSION: 1. 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 No new or contrast-enhancing lesions. 2. Unchanged moderate C5-6 spinal canal stenosis and mild bilateral neural foraminal stenosis. 3. Unchanged clumping of the distal cauda equina at the lumbosacral junction. This may be due to mass effect from an arachnoid web or a sequela of arachnoiditis. No abnormal enhancement. 4. Mild spinal canal stenosis at L2-L3 with moderate right and mild left neural foraminal stenosis. 5. Moderate left L5-S1 neural foraminal stenosis.  PATIENT SURVEYS:  Modified Oswestry:  MODIFIED OSWESTRY DISABILITY SCALE  Date: 1/15 Score  Pain intensity 4 =  Pain medication provides me with little relief from pain.  2. Personal care (washing, dressing, etc.) 3 =  I  need help, but I am able to manage most of my personal care.  3. Lifting 5 =  I cannot lift or carry anything at all.  4. Walking 2 =  Pain prevents me from walking more than  mile.  5. Sitting  0 =  I can sit in any chair as long as I like.  6. Standing 4 =  Pain prevents me from standing more than 10 minutes.  7. Sleeping 2 =  Even when I take pain medication, I sleep less than 6 hours  8. Social Life 2 = Pain prevents me from participating in more energetic activities (eg. sports, dancing).  9. Traveling Not answered  10. Employment/ Homemaking Not answered  Total 22/40= 55%   Interpretation of scores: Score Category Description  0-20% Minimal Disability The patient can cope with most living activities. Usually no treatment is indicated apart from advice on lifting, sitting and exercise  21-40% Moderate Disability The patient experiences more pain and difficulty with sitting, lifting and standing. Travel and social life are more difficult and they may be disabled from work. Personal care, sexual activity and sleeping are not grossly affected, and the patient can usually be managed by conservative means  41-60% Severe Disability Pain remains the main problem in this group, but activities of daily living are affected. These patients require a detailed investigation  61-80% Crippled Back pain impinges on all aspects of the patients life. Positive intervention is required  81-100% Bed-bound These patients are either bed-bound or exaggerating their symptoms  Bluford FORBES Zoe DELENA Karon DELENA, et al. Surgery versus conservative management of stable thoracolumbar fracture: the PRESTO feasibility RCT. [place] ([place]): [employer]; 2021 Nov. [facility] Technology Assessment, No. 25.62.) Appendix 3, Oswestry Disability Index category descriptors. Available from: Findjewelers.cz  Minimally Clinically Important Difference (MCID) = 12.8%  COGNITION: Overall cognitive  status: Within functional limits for tasks assessed     POSTURE: stands forward flexed with attempt to stand more erect, he reports increased spinal pain  LUMBAR ROM: seated he his able to forward flex to reach his ankles; able to use his abdominals to sit up from the back of the chair without UE assist  LOWER EXTREMITY ROM:   assessed in sitting: able to do partial range of motion for ankle PF and DF, able to fully extend right/left knees, able to actively hip flex to 95 degrees right/left; uses Ues to guide Les to figure 4 position  LOWER EXTREMITY MMT:    FUNCTIONAL TESTS:  Heavy UE assist to rise from a standard chair Able to stand 20 sec but then then needs to sit (touches walker for balance)  GAIT: Comments: bent over on forearms with upright forearm walker;  scissoring gait, decreased knee extension on right   TREATMENT DATE:  06/16/24 Started sitting on the table    shoulder retraction x 10 bil with vcs for posture verbally and with mirror  Trunk back and forward small movements x 5 for core  Small mermaid movement x 2 bil    -moved over to chair for support  Seated ball squeeze 6 x 10  -Stand at walker x 60 with cueing to try and be taller than PT by straightening knees and squeezing glutes  Ankle pumps x 10 with leg extended   LAQ 2# 2x5 bil   Glute sets 5 x 10  Seated march x 10bil   Slider hamstring curl with 2# x 10 bil  Hip abd clams green x10  Lifting leg up over weight on the floor, hurdle was a bit too high 2x5 bil with more trouble on the Rt leg Sit to stand at platform walker x 60 and then stood again and walked out the door to the car.  Helped transfer into  the car.   06/14/24 Attempted to sit on plinth but losing balance back and to the right, moved to the armchair OTAGO exercises:  Cervical retraction x 5 bil   No back support on chair retractions x 5  Ankle pumps x 10 with leg extended   LAQ 2x5 bil   Sitting forward in the chair without support 10  x 5   Discussed AFO which pt is interested and will talk to PCP  Seated march x 5bil   Seated ball squeeze 5 x 10 Lifting leg up over weight on the floor, hurdle was a bit too high 2x5 bil with more trouble on the Rt leg Sit to stand at platform walker x 30 and then stood again and walked out the door to the car.  Helped transfer into the car.   06/09/2024 evaluation          Used spinal model to discuss spinal stenosis and plan for appropriate exercise selection  Discussed working on bouts of standing to prepare for his daughter's wedding but also that some sitting breaks would most likely be necessary                                                                                                                  PATIENT EDUCATION:  Education details: Educated patient on anatomy and physiology of current symptoms, prognosis, plan of care as well as initial self care strategies to promote recovery Person educated: Patient Education method: Explanation Education comprehension: verbalized understanding  HOME EXERCISE PROGRAM: To be started  ASSESSMENT:  CLINICAL IMPRESSION: Pt tolerated treatment well.  Increased to use of weights and more standing attempts. He did have LOB after the first of sitting on the table without support and we moved to an arm chair for more stability.  Discussed how this would be good core work as tolerated.    OBJECTIVE IMPAIRMENTS: decreased activity tolerance, decreased mobility, difficulty walking, decreased strength, impaired perceived functional ability, postural dysfunction, and pain.   ACTIVITY LIMITATIONS: transfers, bathing, toileting, dressing, hygiene/grooming, and locomotion level  PARTICIPATION LIMITATIONS: community activity and church  PERSONAL FACTORS: 3+ comorbidities: MS,  are also affecting patient's functional outcome; non -driver transportation may be an issue  REHAB POTENTIAL: Good  CLINICAL DECISION MAKING: Evolving/moderate  complexity  EVALUATION COMPLEXITY: Moderate   GOALS: Goals reviewed with patient? Yes  SHORT TERM GOALS: Target date: 07/07/2024   The patient will demonstrate knowledge of basic self care strategies and exercises to promote healing  Baseline: Goal status: INITIAL  2.  The patient will report a 25% improvement in pain levels with functional activities which are currently difficult including a stumble or misstep in a hole, bending over, going up an incline, standing/walking Baseline:  Goal status: INITIAL  3.  The patient will have improved trunk flexor and extensor muscle strength needed to stand for 1 minute Baseline:  Goal status: INITIAL  4.  The patient will be able to perform 20 min of exercise with pain at a  tolerable level Baseline:  Goal status: INITIAL   LONG TERM GOALS: Target date: 08/04/2024   The patient will be independent in a safe self progression of a home exercise program to promote further recovery of function  Baseline:  Goal status: INITIAL  2.  The patient will report a 50% improvement in pain levels with functional activities which are currently difficult including a stumble or misstep in a hole, bending over, going up an incline, standing/walking Baseline:  Goal status: INITIAL  3.  The patient will have improved trunk flexor and extensor muscle strength needed to stand for 2 minutes Baseline:  Goal status: INITIAL  4.  The patient will be able to perform 40 min of exercise with pain at a tolerable level Baseline:  Goal status: INITIAL  5.  Modified Oswestry Index improved to    45%   indicating improved function with less pain Baseline:  Goal status: INITIAL   PLAN:  PT FREQUENCY: 1-2x/week  PT DURATION: 12 weeks  PLANNED INTERVENTIONS: 97164- PT Re-evaluation, 97750- Physical Performance Testing, 97110-Therapeutic exercises, 97530- Therapeutic activity, 97112- Neuromuscular re-education, 97535- Self Care, [id number]- Manual therapy, [id number]-  Aquatic Therapy, H9716- Electrical stimulation (unattended), [phone]- Electrical stimulation (manual), [id number]- Ultrasound, [id number]- Traction (mechanical), [id number]- Ionotophoresis 4mg /ml Dexamethasone, 79439 (1-2 muscles), 20561 (3+ muscles)- Dry Needling, Patient/Family education, Taping, Joint mobilization, Spinal mobilization, Cryotherapy, and Moist heat.  PLAN FOR NEXT SESSION: lumbar stenosis: seated ex's for stretching and strengthening;  short periods of time standing progression to meet patient's goal of standing for his daughter's wedding in April  Glade Pesa, PT 06/16/24 4:52 PM Phone: [phone] Fax: [phone]    "

## 2024-06-21 ENCOUNTER — Ambulatory Visit

## 2024-06-23 ENCOUNTER — Ambulatory Visit

## 2024-06-23 DIAGNOSIS — M5459 Other low back pain: Secondary | ICD-10-CM

## 2024-06-23 DIAGNOSIS — R252 Cramp and spasm: Secondary | ICD-10-CM

## 2024-06-23 DIAGNOSIS — R293 Abnormal posture: Secondary | ICD-10-CM

## 2024-06-23 DIAGNOSIS — R262 Difficulty in walking, not elsewhere classified: Secondary | ICD-10-CM

## 2024-06-23 DIAGNOSIS — M6281 Muscle weakness (generalized): Secondary | ICD-10-CM

## 2024-06-23 DIAGNOSIS — R5381 Other malaise: Secondary | ICD-10-CM

## 2024-06-23 NOTE — Therapy (Signed)
 " OUTPATIENT PHYSICAL THERAPY THORACOLUMBAR    Patient Name: James Gibbs MRN: [id number] DOB:May 05, 1956, 69 y.o., male Today's Date: 06/23/2024  END OF SESSION:  PT End of Session - 06/23/24 1232     Visit Number 4    Date for Recertification  09/01/24    Authorization Type [facility] 15 visits 12/12-4/11    Authorization - Visit Number 4    Authorization - Number of Visits 15    PT Start Time 1232    PT Stop Time 1313    PT Time Calculation (min) 41 min    Activity Tolerance Patient tolerated treatment well    Behavior During Therapy WFL for tasks assessed/performed            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    Malignant hypertension    Mitral valve anterior leaflet prolapse    Mixed hyperlipidemia    Multiple sclerosis    Past Surgical History:  Procedure Laterality Date   COLONOSCOPY WITH PROPOFOL  N/A 12/17/2018   Procedure: COLONOSCOPY WITH PROPOFOL ;  Surgeon: Donnald Charleston, MD;  Location: [facility];  Service: Endoscopy;  Laterality: N/A;   ESOPHAGOGASTRODUODENOSCOPY (EGD) WITH PROPOFOL  N/A 12/14/2018   Procedure: ESOPHAGOGASTRODUODENOSCOPY (EGD) MODERATE SEDATION;  Surgeon: Donnald Charleston, MD;  Location: [facility];  Service: Endoscopy;  Laterality: N/A;   GIVENS CAPSULE STUDY N/A 12/18/2018   Procedure: GIVENS CAPSULE STUDY;  Surgeon: Burnette Elsie, MD;  Location: [facility];  Service: Endoscopy;  Laterality: N/A;   none     Patient Active Problem List   Diagnosis Date Noted   Hyperlipidemia associated with type 2 diabetes mellitus (HCC) 10/07/2023   Multiple sclerosis exacerbation 07/26/2023   Right leg weakness 03/26/2023   Hypokalemia 09/22/2022   Constipation 09/22/2022   Type 2 diabetes mellitus (HCC) 09/22/2022   Generalized weakness 09/04/2022   Multiple sclerosis 01/30/2022   Hypertension associated with diabetes (HCC) 12/13/2018   Gout 12/13/2018    HYPERTENSION, UNCONTROLLED 06/04/2009   OBESITY, UNSPECIFIED 08/15/2008   MITRAL REGURGITATION 08/15/2008   MITRAL VALVE PROLAPSE 08/15/2008    THERAPY DIAG:  Back pain  ONSET DATE: ongoing/worsening over time  SUBJECTIVE:                                                                                                                                                                                           SUBJECTIVE STATEMENT: Patient reports he is doing well.  Needed max assist to rise from chair in lobby.     EVAL: Years long history of back pain.  Using forearm  walker Upwalker secondary to neurologic changes related to MS.  I can't do inclines. If no incline I can walk a block or 2.  Bedroom to living room with walker mostly.  Tried stationary bike and had excruciating pain.  I've been dealing with a lot of stuff with MS, diabetes, high blood pressure. Difficulty lifting my right leg related to MS.  Left thumb and index finger numbness  Was going to church but that has stopped  Incontinence plays a big part in my social life  My wife died 10 years ago Sits for ADLs Not sleeping well lately   PERTINENT HISTORY:  MS; HTN, Diabetes Unable to drive-friend drove him today  PAIN:  Are you having pain? Yes NPRS scale: 5/10 Pain location: length of spine Pain orientation: Bilateral  PAIN TYPE: aching Pain description: intermittent  Aggravating factors: if I stumble or step in a hole, bending over, going up an incline, standing/walking Relieving factors: sitting, takes pain medication    PRECAUTIONS: Fall, avoid overfatigue related to MS  WEIGHT BEARING RESTRICTIONS: No  FALLS:  Has patient fallen in last 6 months? Yes. Number of falls 5, last time was getting out of the shower and my knees get weak, pushes [employer] button and fire dept helped up  LIVING ENVIRONMENT: has an aid who helps with meals and get in/out of the shower; dries off and helps with medicine Has  following equipment at home: Upwalker forearm walker and also has RW, has a manual W/C, tried to get a scooter but never could find something affordable, has hospital bed   PLOF: Independent with household mobility without device  PATIENT GOALS:  my daughter is getting married in April, I want to be able to stand at the wedding. Someone else will walk her down the aisle - I would like to stand up to   OBJECTIVE:  Note: Objective measures were completed at Evaluation unless otherwise noted.  DIAGNOSTIC FINDINGS:  Disc levels:   L1-L2: Normal disc space and facet joints. No spinal canal stenosis. No neural foraminal stenosis.   L2-L3: Disc space narrowing and small bulge with mild facet hypertrophy. Mild spinal canal stenosis. Moderate right and mild left neural foraminal stenosis.   L3-L4: Small disc bulge with endplate spurring. No spinal canal stenosis. No neural foraminal stenosis.   L4-L5: Small disc bulge with endplate spurring. No spinal canal stenosis. Mild bilateral neural foraminal stenosis.   L5-S1: Small disc bulge. No spinal canal stenosis. Moderate left neural foraminal stenosis.   Visualized sacrum: Normal.   IMPRESSION: 1. 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 No new or contrast-enhancing lesions. 2. Unchanged moderate C5-6 spinal canal stenosis and mild bilateral neural foraminal stenosis. 3. Unchanged clumping of the distal cauda equina at the lumbosacral junction. This may be due to mass effect from an arachnoid web or a sequela of arachnoiditis. No abnormal enhancement. 4. Mild spinal canal stenosis at L2-L3 with moderate right and mild left neural foraminal stenosis. 5. Moderate left L5-S1 neural foraminal stenosis.  PATIENT SURVEYS:  Modified Oswestry:  MODIFIED OSWESTRY DISABILITY SCALE  Date: 1/15 Score  Pain intensity 4 =  Pain medication provides me with little relief from pain.  2.  Personal care (washing, dressing, etc.) 3 =  I need help, but I am able to manage most of my personal care.  3. Lifting 5 =  I cannot lift or carry anything at all.  4. Walking 2 =  Pain prevents  me from walking more than  mile.  5. Sitting 0 =  I can sit in any chair as long as I like.  6. Standing 4 =  Pain prevents me from standing more than 10 minutes.  7. Sleeping 2 =  Even when I take pain medication, I sleep less than 6 hours  8. Social Life 2 = Pain prevents me from participating in more energetic activities (eg. sports, dancing).  9. Traveling Not answered  10. Employment/ Homemaking Not answered  Total 22/40= 55%   Interpretation of scores: Score Category Description  0-20% Minimal Disability The patient can cope with most living activities. Usually no treatment is indicated apart from advice on lifting, sitting and exercise  21-40% Moderate Disability The patient experiences more pain and difficulty with sitting, lifting and standing. Travel and social life are more difficult and they may be disabled from work. Personal care, sexual activity and sleeping are not grossly affected, and the patient can usually be managed by conservative means  41-60% Severe Disability Pain remains the main problem in this group, but activities of daily living are affected. These patients require a detailed investigation  61-80% Crippled Back pain impinges on all aspects of the patients life. Positive intervention is required  81-100% Bed-bound These patients are either bed-bound or exaggerating their symptoms  Bluford FORBES Zoe DELENA Karon DELENA, et al. Surgery versus conservative management of stable thoracolumbar fracture: the PRESTO feasibility RCT. [place] ([place]): [employer]; 2021 Nov. [facility] Technology Assessment, No. 25.62.) Appendix 3, Oswestry Disability Index category descriptors. Available from: Findjewelers.cz  Minimally Clinically Important Difference  (MCID) = 12.8%  COGNITION: Overall cognitive status: Within functional limits for tasks assessed     POSTURE: stands forward flexed with attempt to stand more erect, he reports increased spinal pain  LUMBAR ROM: seated he his able to forward flex to reach his ankles; able to use his abdominals to sit up from the back of the chair without UE assist  LOWER EXTREMITY ROM:   assessed in sitting: able to do partial range of motion for ankle PF and DF, able to fully extend right/left knees, able to actively hip flex to 95 degrees right/left; uses Ues to guide Les to figure 4 position  LOWER EXTREMITY MMT:    FUNCTIONAL TESTS:  Heavy UE assist to rise from a standard chair Able to stand 20 sec but then then needs to sit (touches walker for balance)  GAIT: Comments: bent over on forearms with upright forearm walker;  scissoring gait, decreased knee extension on right   TREATMENT DATE:  06/23/24 Nustep x 10 min level 5 (PT present to discuss status/progress/safety/ patient mentioned he likes to walk on his treadmill but we discussed the safety issues with this Sit to stand x 5 from regular arm chair no cushion emphasizing nose over toes Standing heel raises x 10 Standing fwd stepping and back (alternating) x 10 Seated LAQ 2 x 10 with 2 lbs Seated march x 20 with 2 lbs Education on home safety while along with gait training.   Gait training with upwalker x 102.9 feet with sb to cga wheelchair behind patient in case of fatigue.  VC's for upright posture and heel strike    06/16/24 Started sitting on the table    shoulder retraction x 10 bil with vcs for posture verbally and with mirror  Trunk back and forward small movements x 5 for core  Small mermaid movement x 2 bil    -moved over  to chair for support  Seated ball squeeze 6 x 10  -Stand at walker x 60 with cueing to try and be taller than PT by straightening knees and squeezing glutes  Ankle pumps x 10 with leg extended   LAQ 2# 2x5  bil   Glute sets 5 x 10  Seated march x 10bil   Slider hamstring curl with 2# x 10 bil  Hip abd clams green x10  Lifting leg up over weight on the floor, hurdle was a bit too high 2x5 bil with more trouble on the Rt leg Sit to stand at platform walker x 60 and then stood again and walked out the door to the car.  Helped transfer into the car.   06/14/24 Attempted to sit on plinth but losing balance back and to the right, moved to the armchair OTAGO exercises:  Cervical retraction x 5 bil   No back support on chair retractions x 5  Ankle pumps x 10 with leg extended   LAQ 2x5 bil   Sitting forward in the chair without support 10 x 5   Discussed AFO which pt is interested and will talk to PCP  Seated march x 5bil   Seated ball squeeze 5 x 10 Lifting leg up over weight on the floor, hurdle was a bit too high 2x5 bil with more trouble on the Rt leg Sit to stand at platform walker x 30 and then stood again and walked out the door to the car.  Helped transfer into the car.   06/09/2024 evaluation          Used spinal model to discuss spinal stenosis and plan for appropriate exercise selection  Discussed working on bouts of standing to prepare for his daughter's wedding but also that some sitting breaks would most likely be necessary                                                                                                                  PATIENT EDUCATION:  Education details: Educated patient on anatomy and physiology of current symptoms, prognosis, plan of care as well as initial self care strategies to promote recovery Person educated: Patient Education method: Explanation Education comprehension: verbalized understanding  HOME EXERCISE PROGRAM: To be started  ASSESSMENT:  CLINICAL IMPRESSION: Patient was able to do sit to stand without assist from arm chair x 5 with vc's.  Family may need education on avoiding over assisting to allow patient to work on functional  strengthening.  He seems to be cavalier at times with regard to desire to be independent.  He mentions walking to the bathroom without his walker.  And trying to do the treadmill independently. We discussed his goals and how falling can result in significant set backs.  He is well motivated and compliant.  He should continue to do well.  He would benefit from continuing skilled PT to progress toward goals below.    OBJECTIVE IMPAIRMENTS: decreased activity tolerance, decreased mobility, difficulty walking, decreased strength, impaired perceived  functional ability, postural dysfunction, and pain.   ACTIVITY LIMITATIONS: transfers, bathing, toileting, dressing, hygiene/grooming, and locomotion level  PARTICIPATION LIMITATIONS: community activity and church  PERSONAL FACTORS: 3+ comorbidities: MS,  are also affecting patient's functional outcome; non -driver transportation may be an issue  REHAB POTENTIAL: Good  CLINICAL DECISION MAKING: Evolving/moderate complexity  EVALUATION COMPLEXITY: Moderate   GOALS: Goals reviewed with patient? Yes  SHORT TERM GOALS: Target date: 07/07/2024   The patient will demonstrate knowledge of basic self care strategies and exercises to promote healing  Baseline: Goal status: In Progress  2.  The patient will report a 25% improvement in pain levels with functional activities which are currently difficult including a stumble or misstep in a hole, bending over, going up an incline, standing/walking Baseline:  Goal status: In Progress  3.  The patient will have improved trunk flexor and extensor muscle strength needed to stand for 1 minute Baseline:  Goal status: In progress  4.  The patient will be able to perform 20 min of exercise with pain at a tolerable level Baseline:  Goal status: MET 06/23/24   LONG TERM GOALS: Target date: 08/04/2024   The patient will be independent in a safe self progression of a home exercise program to promote further  recovery of function  Baseline:  Goal status: INITIAL  2.  The patient will report a 50% improvement in pain levels with functional activities which are currently difficult including a stumble or misstep in a hole, bending over, going up an incline, standing/walking Baseline:  Goal status: INITIAL  3.  The patient will have improved trunk flexor and extensor muscle strength needed to stand for 2 minutes Baseline:  Goal status: INITIAL  4.  The patient will be able to perform 40 min of exercise with pain at a tolerable level Baseline:  Goal status: INITIAL  5.  Modified Oswestry Index improved to    45%   indicating improved function with less pain Baseline:  Goal status: INITIAL   PLAN:  PT FREQUENCY: 1-2x/week  PT DURATION: 12 weeks  PLANNED INTERVENTIONS: 97164- PT Re-evaluation, 97750- Physical Performance Testing, 97110-Therapeutic exercises, 97530- Therapeutic activity, [id number]- Neuromuscular re-education, 97535- Self Care, [id number]- Manual therapy, [id number]- Aquatic Therapy, H9716- Electrical stimulation (unattended), [phone]- Electrical stimulation (manual), [id number]- Ultrasound, [id number]- Traction (mechanical), [id number]- Ionotophoresis 4mg /ml Dexamethasone, 79439 (1-2 muscles), 20561 (3+ muscles)- Dry Needling, Patient/Family education, Taping, Joint mobilization, Spinal mobilization, Cryotherapy, and Moist heat.  PLAN FOR NEXT SESSION: Nustep, sit to stand, standing tolerance, stepping tasks in standing,  TKE exercises, postural strength  Emmelyn Schmale B. Milynn Quirion, PT 06/23/24 2:25 PM [facility] [address] [place], [place] [address] Phone # [phone] Fax [phone]     "

## 2024-06-28 ENCOUNTER — Ambulatory Visit

## 2024-06-28 DIAGNOSIS — M5459 Other low back pain: Secondary | ICD-10-CM

## 2024-06-28 DIAGNOSIS — R252 Cramp and spasm: Secondary | ICD-10-CM

## 2024-06-28 DIAGNOSIS — R293 Abnormal posture: Secondary | ICD-10-CM

## 2024-06-28 DIAGNOSIS — R262 Difficulty in walking, not elsewhere classified: Secondary | ICD-10-CM

## 2024-06-28 DIAGNOSIS — M6281 Muscle weakness (generalized): Secondary | ICD-10-CM

## 2024-06-30 ENCOUNTER — Telehealth: Payer: Self-pay | Admitting: Physical Therapy

## 2024-06-30 ENCOUNTER — Ambulatory Visit: Admitting: Physical Therapy

## 2024-06-30 NOTE — Telephone Encounter (Signed)
 Left message on voicemail on mobile (preferred) phone number regarding no show for appt today.  Reminded of next appt on 2/11 at 4:15

## 2024-07-06 ENCOUNTER — Ambulatory Visit
